# Patient Record
Sex: Female | Born: 1935 | Race: White | Hispanic: No | Smoking: Former smoker
Health system: Southern US, Community
[De-identification: ages and names within clinical notes are randomized; demographics above are authoritative.]

## PROBLEM LIST (undated history)

## (undated) DIAGNOSIS — C549 Malignant neoplasm of corpus uteri, unspecified: Secondary | ICD-10-CM

## (undated) DIAGNOSIS — H9190 Unspecified hearing loss, unspecified ear: Secondary | ICD-10-CM

## (undated) DIAGNOSIS — K648 Other hemorrhoids: Secondary | ICD-10-CM

## (undated) DIAGNOSIS — Z8371 Family history of colonic polyps: Secondary | ICD-10-CM

## (undated) DIAGNOSIS — C541 Malignant neoplasm of endometrium: Secondary | ICD-10-CM

## (undated) DIAGNOSIS — K449 Diaphragmatic hernia without obstruction or gangrene: Secondary | ICD-10-CM

## (undated) DIAGNOSIS — Z83719 Family history of colon polyps, unspecified: Secondary | ICD-10-CM

## (undated) DIAGNOSIS — I350 Nonrheumatic aortic (valve) stenosis: Secondary | ICD-10-CM

## (undated) DIAGNOSIS — C50919 Malignant neoplasm of unspecified site of unspecified female breast: Secondary | ICD-10-CM

## (undated) DIAGNOSIS — R7309 Other abnormal glucose: Secondary | ICD-10-CM

## (undated) DIAGNOSIS — F1010 Alcohol abuse, uncomplicated: Secondary | ICD-10-CM

## (undated) DIAGNOSIS — R42 Dizziness and giddiness: Secondary | ICD-10-CM

## (undated) DIAGNOSIS — E785 Hyperlipidemia, unspecified: Secondary | ICD-10-CM

## (undated) DIAGNOSIS — I10 Essential (primary) hypertension: Secondary | ICD-10-CM

## (undated) DIAGNOSIS — M1990 Unspecified osteoarthritis, unspecified site: Secondary | ICD-10-CM

## (undated) DIAGNOSIS — F17200 Nicotine dependence, unspecified, uncomplicated: Secondary | ICD-10-CM

## (undated) DIAGNOSIS — K5730 Diverticulosis of large intestine without perforation or abscess without bleeding: Secondary | ICD-10-CM

## (undated) DIAGNOSIS — M25519 Pain in unspecified shoulder: Secondary | ICD-10-CM

## (undated) DIAGNOSIS — K224 Dyskinesia of esophagus: Secondary | ICD-10-CM

## (undated) DIAGNOSIS — N950 Postmenopausal bleeding: Secondary | ICD-10-CM

## (undated) DIAGNOSIS — R011 Cardiac murmur, unspecified: Secondary | ICD-10-CM

## (undated) HISTORY — DX: Malignant neoplasm of corpus uteri, unspecified: C54.9

## (undated) HISTORY — DX: Unspecified hearing loss, unspecified ear: H91.90

## (undated) HISTORY — DX: Other hemorrhoids: K64.8

## (undated) HISTORY — DX: Family history of colon polyps, unspecified: Z83.719

## (undated) HISTORY — PX: OTHER SURGICAL HISTORY: SHX169

## (undated) HISTORY — PX: CATARACT EXTRACTION: SUR2

## (undated) HISTORY — DX: Dizziness and giddiness: R42

## (undated) HISTORY — DX: Nicotine dependence, unspecified, uncomplicated: F17.200

## (undated) HISTORY — DX: Dyskinesia of esophagus: K22.4

## (undated) HISTORY — DX: Alcohol abuse, uncomplicated: F10.10

## (undated) HISTORY — DX: Family history of colonic polyps: Z83.71

## (undated) HISTORY — DX: Pain in unspecified shoulder: M25.519

## (undated) HISTORY — DX: Hyperlipidemia, unspecified: E78.5

## (undated) HISTORY — DX: Essential (primary) hypertension: I10

## (undated) HISTORY — DX: Diverticulosis of large intestine without perforation or abscess without bleeding: K57.30

## (undated) HISTORY — DX: Other abnormal glucose: R73.09

## (undated) HISTORY — DX: Malignant neoplasm of endometrium: C54.1

## (undated) HISTORY — DX: Postmenopausal bleeding: N95.0

## (undated) HISTORY — DX: Cardiac murmur, unspecified: R01.1

## (undated) HISTORY — DX: Diaphragmatic hernia without obstruction or gangrene: K44.9

---

## 1995-12-27 DIAGNOSIS — K449 Diaphragmatic hernia without obstruction or gangrene: Secondary | ICD-10-CM | POA: Insufficient documentation

## 1999-03-10 ENCOUNTER — Encounter: Admission: RE | Admit: 1999-03-10 | Discharge: 1999-03-10 | Payer: Self-pay | Admitting: Family Medicine

## 1999-03-22 ENCOUNTER — Other Ambulatory Visit: Admission: RE | Admit: 1999-03-22 | Discharge: 1999-03-22 | Payer: Self-pay | Admitting: Family Medicine

## 2000-04-01 ENCOUNTER — Other Ambulatory Visit: Admission: RE | Admit: 2000-04-01 | Discharge: 2000-04-01 | Payer: Self-pay | Admitting: Family Medicine

## 2000-04-12 ENCOUNTER — Encounter: Payer: Self-pay | Admitting: Internal Medicine

## 2000-04-12 ENCOUNTER — Encounter: Admission: RE | Admit: 2000-04-12 | Discharge: 2000-04-12 | Payer: Self-pay | Admitting: Internal Medicine

## 2000-05-14 ENCOUNTER — Encounter: Payer: Self-pay | Admitting: Gastroenterology

## 2001-04-14 ENCOUNTER — Encounter: Admission: RE | Admit: 2001-04-14 | Discharge: 2001-04-14 | Payer: Self-pay | Admitting: Family Medicine

## 2001-04-14 ENCOUNTER — Encounter: Payer: Self-pay | Admitting: Family Medicine

## 2002-09-17 ENCOUNTER — Encounter: Payer: Self-pay | Admitting: Family Medicine

## 2002-09-17 ENCOUNTER — Ambulatory Visit (HOSPITAL_COMMUNITY): Admission: RE | Admit: 2002-09-17 | Discharge: 2002-09-17 | Payer: Self-pay | Admitting: Family Medicine

## 2003-09-20 ENCOUNTER — Ambulatory Visit (HOSPITAL_COMMUNITY): Admission: RE | Admit: 2003-09-20 | Discharge: 2003-09-20 | Payer: Self-pay | Admitting: Family Medicine

## 2003-09-22 ENCOUNTER — Other Ambulatory Visit: Admission: RE | Admit: 2003-09-22 | Discharge: 2003-09-22 | Payer: Self-pay | Admitting: Internal Medicine

## 2003-09-26 ENCOUNTER — Encounter (INDEPENDENT_AMBULATORY_CARE_PROVIDER_SITE_OTHER): Payer: Self-pay | Admitting: Internal Medicine

## 2004-09-20 ENCOUNTER — Ambulatory Visit (HOSPITAL_COMMUNITY): Admission: RE | Admit: 2004-09-20 | Discharge: 2004-09-20 | Payer: Self-pay | Admitting: Family Medicine

## 2004-10-26 DIAGNOSIS — I359 Nonrheumatic aortic valve disorder, unspecified: Secondary | ICD-10-CM | POA: Insufficient documentation

## 2004-11-06 ENCOUNTER — Ambulatory Visit: Payer: Self-pay | Admitting: Family Medicine

## 2004-11-20 ENCOUNTER — Ambulatory Visit: Payer: Self-pay | Admitting: Family Medicine

## 2004-11-21 ENCOUNTER — Ambulatory Visit: Payer: Self-pay

## 2004-12-04 ENCOUNTER — Ambulatory Visit: Payer: Self-pay | Admitting: Family Medicine

## 2004-12-05 ENCOUNTER — Ambulatory Visit: Payer: Self-pay | Admitting: Family Medicine

## 2004-12-05 ENCOUNTER — Ambulatory Visit: Payer: Self-pay | Admitting: *Deleted

## 2004-12-29 ENCOUNTER — Ambulatory Visit: Payer: Self-pay | Admitting: Family Medicine

## 2005-03-09 ENCOUNTER — Ambulatory Visit: Payer: Self-pay | Admitting: Family Medicine

## 2005-04-12 ENCOUNTER — Ambulatory Visit: Payer: Self-pay | Admitting: *Deleted

## 2005-05-10 ENCOUNTER — Ambulatory Visit: Payer: Self-pay | Admitting: *Deleted

## 2005-05-10 ENCOUNTER — Ambulatory Visit: Payer: Self-pay

## 2005-07-20 ENCOUNTER — Ambulatory Visit: Payer: Self-pay | Admitting: *Deleted

## 2005-08-21 ENCOUNTER — Ambulatory Visit: Payer: Self-pay | Admitting: *Deleted

## 2005-09-21 ENCOUNTER — Ambulatory Visit (HOSPITAL_COMMUNITY): Admission: RE | Admit: 2005-09-21 | Discharge: 2005-09-21 | Payer: Self-pay | Admitting: Family Medicine

## 2005-09-26 ENCOUNTER — Encounter: Admission: RE | Admit: 2005-09-26 | Discharge: 2005-09-26 | Payer: Self-pay | Admitting: Family Medicine

## 2005-11-22 ENCOUNTER — Encounter: Payer: Self-pay | Admitting: Cardiology

## 2005-11-22 ENCOUNTER — Ambulatory Visit: Payer: Self-pay

## 2005-11-25 DIAGNOSIS — R0989 Other specified symptoms and signs involving the circulatory and respiratory systems: Secondary | ICD-10-CM | POA: Insufficient documentation

## 2005-12-03 ENCOUNTER — Ambulatory Visit: Payer: Self-pay | Admitting: Family Medicine

## 2005-12-27 ENCOUNTER — Ambulatory Visit: Payer: Self-pay | Admitting: Family Medicine

## 2006-02-14 ENCOUNTER — Ambulatory Visit: Payer: Self-pay | Admitting: *Deleted

## 2006-02-18 ENCOUNTER — Ambulatory Visit: Payer: Self-pay | Admitting: *Deleted

## 2006-02-25 ENCOUNTER — Ambulatory Visit: Payer: Self-pay

## 2006-03-13 ENCOUNTER — Ambulatory Visit: Payer: Self-pay

## 2006-03-27 ENCOUNTER — Ambulatory Visit: Payer: Self-pay | Admitting: Internal Medicine

## 2006-04-30 ENCOUNTER — Ambulatory Visit: Payer: Self-pay | Admitting: *Deleted

## 2006-05-13 ENCOUNTER — Ambulatory Visit: Payer: Self-pay | Admitting: *Deleted

## 2006-05-13 LAB — CONVERTED CEMR LAB
CO2: 29 meq/L (ref 19–32)
Chloride: 109 meq/L (ref 96–112)
Chol/HDL Ratio, serum: 2.8
Cholesterol: 134 mg/dL (ref 0–200)
GFR calc non Af Amer: 75 mL/min
Glomerular Filtration Rate, Af Am: 91 mL/min/{1.73_m2}
LDL Cholesterol: 75 mg/dL (ref 0–99)
Potassium: 4.1 meq/L (ref 3.5–5.1)
Total Protein: 6.3 g/dL (ref 6.0–8.3)
Triglyceride fasting, serum: 58 mg/dL (ref 0–149)
VLDL: 12 mg/dL (ref 0–40)

## 2006-06-04 ENCOUNTER — Ambulatory Visit: Payer: Self-pay | Admitting: Family Medicine

## 2006-06-28 DIAGNOSIS — M899 Disorder of bone, unspecified: Secondary | ICD-10-CM

## 2006-06-28 DIAGNOSIS — M949 Disorder of cartilage, unspecified: Secondary | ICD-10-CM

## 2006-06-28 LAB — HM DEXA SCAN: HM Dexa Scan: POSITIVE

## 2006-07-18 ENCOUNTER — Ambulatory Visit: Payer: Self-pay | Admitting: Internal Medicine

## 2006-08-20 ENCOUNTER — Ambulatory Visit: Payer: Self-pay | Admitting: Family Medicine

## 2006-09-10 ENCOUNTER — Ambulatory Visit: Payer: Self-pay | Admitting: *Deleted

## 2006-09-10 LAB — CONVERTED CEMR LAB
ALT: 32 units/L (ref 0–40)
BUN: 16 mg/dL (ref 6–23)
Bilirubin, Direct: 0.1 mg/dL (ref 0.0–0.3)
Calcium: 10 mg/dL (ref 8.4–10.5)
HDL: 65.2 mg/dL (ref >39.0)
LDL Cholesterol: 69 mg/dL (ref 0–99)
Total CHOL/HDL Ratio: 2.3
Total Protein: 7.2 g/dL (ref 6.0–8.3)
Triglycerides: 80 mg/dL (ref 0–149)

## 2006-10-09 ENCOUNTER — Encounter: Admission: RE | Admit: 2006-10-09 | Discharge: 2006-10-09 | Payer: Self-pay | Admitting: Family Medicine

## 2006-10-11 ENCOUNTER — Encounter: Payer: Self-pay | Admitting: Family Medicine

## 2006-10-15 ENCOUNTER — Encounter (INDEPENDENT_AMBULATORY_CARE_PROVIDER_SITE_OTHER): Payer: Self-pay | Admitting: *Deleted

## 2006-10-28 ENCOUNTER — Ambulatory Visit: Payer: Self-pay

## 2006-10-28 ENCOUNTER — Encounter (INDEPENDENT_AMBULATORY_CARE_PROVIDER_SITE_OTHER): Payer: Self-pay | Admitting: *Deleted

## 2006-10-30 ENCOUNTER — Ambulatory Visit: Payer: Self-pay | Admitting: *Deleted

## 2007-02-24 ENCOUNTER — Ambulatory Visit: Payer: Self-pay | Admitting: Cardiovascular Disease

## 2007-03-07 ENCOUNTER — Ambulatory Visit: Payer: Self-pay | Admitting: Family Medicine

## 2007-08-12 ENCOUNTER — Ambulatory Visit: Payer: Self-pay | Admitting: Family Medicine

## 2007-09-23 ENCOUNTER — Ambulatory Visit: Payer: Self-pay | Admitting: Cardiovascular Disease

## 2007-09-23 LAB — CONVERTED CEMR LAB
AST: 26 units/L (ref 0–37)
Albumin: 3.9 g/dL (ref 3.5–5.2)
Cholesterol: 148 mg/dL (ref 0–200)
HDL: 53.9 mg/dL (ref >39.0)
LDL Cholesterol: 76 mg/dL (ref 0–99)
Total Bilirubin: 0.7 mg/dL (ref 0.3–1.2)
Total CHOL/HDL Ratio: 2.7
Total Protein: 6.8 g/dL (ref 6.0–8.3)
Triglycerides: 93 mg/dL (ref 0–149)
VLDL: 19 mg/dL (ref 0–40)

## 2007-10-07 ENCOUNTER — Ambulatory Visit: Payer: Self-pay | Admitting: Family Medicine

## 2007-10-07 DIAGNOSIS — I10 Essential (primary) hypertension: Secondary | ICD-10-CM | POA: Insufficient documentation

## 2007-10-07 DIAGNOSIS — E785 Hyperlipidemia, unspecified: Secondary | ICD-10-CM

## 2007-10-07 DIAGNOSIS — N950 Postmenopausal bleeding: Secondary | ICD-10-CM

## 2007-10-07 LAB — CONVERTED CEMR LAB
Epithelial cells, urine: 0 /lpf
Ketones, urine, test strip: NEGATIVE
RBC / HPF: 0
Specific Gravity, Urine: 1.015
pH: 6

## 2007-10-10 ENCOUNTER — Ambulatory Visit (HOSPITAL_COMMUNITY): Admission: RE | Admit: 2007-10-10 | Discharge: 2007-10-10 | Payer: Self-pay | Admitting: Family Medicine

## 2007-10-13 ENCOUNTER — Encounter (INDEPENDENT_AMBULATORY_CARE_PROVIDER_SITE_OTHER): Payer: Self-pay | Admitting: *Deleted

## 2007-10-15 ENCOUNTER — Encounter (INDEPENDENT_AMBULATORY_CARE_PROVIDER_SITE_OTHER): Payer: Self-pay | Admitting: Internal Medicine

## 2007-10-15 DIAGNOSIS — K224 Dyskinesia of esophagus: Secondary | ICD-10-CM

## 2007-10-15 DIAGNOSIS — K648 Other hemorrhoids: Secondary | ICD-10-CM | POA: Insufficient documentation

## 2007-10-15 DIAGNOSIS — K5730 Diverticulosis of large intestine without perforation or abscess without bleeding: Secondary | ICD-10-CM | POA: Insufficient documentation

## 2007-10-15 DIAGNOSIS — F1010 Alcohol abuse, uncomplicated: Secondary | ICD-10-CM | POA: Insufficient documentation

## 2007-10-15 DIAGNOSIS — K644 Residual hemorrhoidal skin tags: Secondary | ICD-10-CM | POA: Insufficient documentation

## 2007-10-15 DIAGNOSIS — F17200 Nicotine dependence, unspecified, uncomplicated: Secondary | ICD-10-CM | POA: Insufficient documentation

## 2007-10-15 DIAGNOSIS — H9190 Unspecified hearing loss, unspecified ear: Secondary | ICD-10-CM | POA: Insufficient documentation

## 2007-10-16 ENCOUNTER — Ambulatory Visit: Payer: Self-pay | Admitting: Obstetrics & Gynecology

## 2007-10-17 ENCOUNTER — Encounter (INDEPENDENT_AMBULATORY_CARE_PROVIDER_SITE_OTHER): Payer: Self-pay | Admitting: Internal Medicine

## 2007-10-17 ENCOUNTER — Encounter: Payer: Self-pay | Admitting: Family Medicine

## 2007-10-21 ENCOUNTER — Ambulatory Visit (HOSPITAL_COMMUNITY): Admission: RE | Admit: 2007-10-21 | Discharge: 2007-10-21 | Payer: Self-pay | Admitting: Obstetrics & Gynecology

## 2007-10-28 ENCOUNTER — Ambulatory Visit: Admission: RE | Admit: 2007-10-28 | Discharge: 2007-10-28 | Payer: Self-pay | Admitting: Gynecologic Oncology

## 2007-10-28 DIAGNOSIS — Z8542 Personal history of malignant neoplasm of other parts of uterus: Secondary | ICD-10-CM | POA: Insufficient documentation

## 2007-10-30 ENCOUNTER — Encounter (INDEPENDENT_AMBULATORY_CARE_PROVIDER_SITE_OTHER): Payer: Self-pay | Admitting: Internal Medicine

## 2007-11-03 ENCOUNTER — Ambulatory Visit: Payer: Self-pay | Admitting: Cardiovascular Disease

## 2007-11-04 ENCOUNTER — Inpatient Hospital Stay (HOSPITAL_COMMUNITY): Admission: RE | Admit: 2007-11-04 | Discharge: 2007-11-07 | Payer: Self-pay | Admitting: Obstetrics & Gynecology

## 2007-11-04 ENCOUNTER — Encounter (INDEPENDENT_AMBULATORY_CARE_PROVIDER_SITE_OTHER): Payer: Self-pay | Admitting: Internal Medicine

## 2007-11-04 ENCOUNTER — Encounter: Payer: Self-pay | Admitting: Gynecology

## 2007-11-04 HISTORY — PX: TOTAL ABDOMINAL HYSTERECTOMY W/ BILATERAL SALPINGOOPHORECTOMY: SHX83

## 2007-11-04 HISTORY — PX: LAPAROSCOPY W/ TOTAL BILATERAL PELVIC AND PERI-AORTIC LYMPHADENECTOMY: SUR807

## 2007-11-10 ENCOUNTER — Encounter (INDEPENDENT_AMBULATORY_CARE_PROVIDER_SITE_OTHER): Payer: Self-pay | Admitting: Internal Medicine

## 2007-11-12 ENCOUNTER — Encounter (INDEPENDENT_AMBULATORY_CARE_PROVIDER_SITE_OTHER): Payer: Self-pay | Admitting: Internal Medicine

## 2007-12-05 ENCOUNTER — Encounter: Payer: Self-pay | Admitting: Family Medicine

## 2007-12-05 ENCOUNTER — Ambulatory Visit: Admission: RE | Admit: 2007-12-05 | Discharge: 2007-12-05 | Payer: Self-pay | Admitting: Gynecology

## 2007-12-08 ENCOUNTER — Ambulatory Visit: Admission: RE | Admit: 2007-12-08 | Discharge: 2008-01-21 | Payer: Self-pay | Admitting: Radiation Oncology

## 2007-12-09 ENCOUNTER — Encounter: Payer: Self-pay | Admitting: Family Medicine

## 2007-12-30 ENCOUNTER — Ambulatory Visit: Payer: Self-pay

## 2007-12-30 ENCOUNTER — Encounter: Payer: Self-pay | Admitting: Cardiovascular Disease

## 2008-02-17 ENCOUNTER — Ambulatory Visit: Payer: Self-pay | Admitting: Family Medicine

## 2008-03-26 ENCOUNTER — Ambulatory Visit: Admission: RE | Admit: 2008-03-26 | Discharge: 2008-03-26 | Payer: Self-pay | Admitting: Gynecology

## 2008-03-26 ENCOUNTER — Other Ambulatory Visit: Admission: RE | Admit: 2008-03-26 | Discharge: 2008-03-26 | Payer: Self-pay | Admitting: Gynecology

## 2008-03-26 ENCOUNTER — Encounter: Payer: Self-pay | Admitting: Gynecology

## 2008-03-28 ENCOUNTER — Encounter (INDEPENDENT_AMBULATORY_CARE_PROVIDER_SITE_OTHER): Payer: Self-pay | Admitting: Internal Medicine

## 2008-04-02 ENCOUNTER — Encounter (INDEPENDENT_AMBULATORY_CARE_PROVIDER_SITE_OTHER): Payer: Self-pay | Admitting: Internal Medicine

## 2008-05-10 ENCOUNTER — Ambulatory Visit: Payer: Self-pay | Admitting: Cardiovascular Disease

## 2008-05-28 HISTORY — PX: ROTATOR CUFF REPAIR: SHX139

## 2008-06-01 ENCOUNTER — Ambulatory Visit: Payer: Self-pay | Admitting: Family Medicine

## 2008-06-02 LAB — CONVERTED CEMR LAB
ALT: 35 units/L (ref 0–35)
AST: 24 units/L (ref 0–37)
BUN: 23 mg/dL (ref 6–23)
Calcium: 10.1 mg/dL (ref 8.4–10.5)
Creatinine, Ser: 0.6 mg/dL (ref 0.4–1.2)
GFR calc Af Amer: 126 mL/min
GFR calc non Af Amer: 104 mL/min
Potassium: 3.5 meq/L (ref 3.5–5.1)
Sodium: 141 meq/L (ref 135–145)

## 2008-06-14 ENCOUNTER — Ambulatory Visit (HOSPITAL_BASED_OUTPATIENT_CLINIC_OR_DEPARTMENT_OTHER): Admission: RE | Admit: 2008-06-14 | Discharge: 2008-06-15 | Payer: Self-pay | Admitting: Specialist

## 2008-07-09 ENCOUNTER — Ambulatory Visit: Payer: Self-pay | Admitting: Internal Medicine

## 2008-07-09 DIAGNOSIS — R7309 Other abnormal glucose: Secondary | ICD-10-CM

## 2008-07-09 DIAGNOSIS — R7303 Prediabetes: Secondary | ICD-10-CM | POA: Insufficient documentation

## 2008-07-14 ENCOUNTER — Encounter: Payer: Self-pay | Admitting: Gynecology

## 2008-07-14 ENCOUNTER — Other Ambulatory Visit: Admission: RE | Admit: 2008-07-14 | Discharge: 2008-07-14 | Payer: Self-pay | Admitting: Gynecology

## 2008-07-14 ENCOUNTER — Encounter (INDEPENDENT_AMBULATORY_CARE_PROVIDER_SITE_OTHER): Payer: Self-pay | Admitting: Internal Medicine

## 2008-07-14 ENCOUNTER — Ambulatory Visit: Admission: RE | Admit: 2008-07-14 | Discharge: 2008-07-14 | Payer: Self-pay | Admitting: Gynecology

## 2008-07-14 LAB — CONVERTED CEMR LAB
Glucose, Bld: 114 mg/dL — ABNORMAL HIGH (ref 70–99)
HDL: 55.2 mg/dL (ref >39.0)
LDL Cholesterol: 76 mg/dL (ref 0–99)
Total CHOL/HDL Ratio: 2.7
VLDL: 19 mg/dL (ref 0–40)

## 2008-07-30 ENCOUNTER — Ambulatory Visit: Payer: Self-pay | Admitting: Gastroenterology

## 2008-07-30 DIAGNOSIS — K921 Melena: Secondary | ICD-10-CM

## 2008-07-30 DIAGNOSIS — K5900 Constipation, unspecified: Secondary | ICD-10-CM | POA: Insufficient documentation

## 2008-08-06 ENCOUNTER — Ambulatory Visit: Payer: Self-pay | Admitting: Gastroenterology

## 2008-08-06 ENCOUNTER — Encounter: Payer: Self-pay | Admitting: Gastroenterology

## 2008-08-06 LAB — HM COLONOSCOPY

## 2008-08-09 ENCOUNTER — Encounter: Payer: Self-pay | Admitting: Gastroenterology

## 2008-09-07 ENCOUNTER — Ambulatory Visit: Payer: Self-pay | Admitting: Family Medicine

## 2008-09-07 DIAGNOSIS — R609 Edema, unspecified: Secondary | ICD-10-CM

## 2008-09-28 ENCOUNTER — Other Ambulatory Visit: Admission: RE | Admit: 2008-09-28 | Discharge: 2008-09-28 | Payer: Self-pay | Admitting: Obstetrics and Gynecology

## 2008-09-28 ENCOUNTER — Encounter: Payer: Self-pay | Admitting: Obstetrics and Gynecology

## 2008-09-28 ENCOUNTER — Ambulatory Visit: Payer: Self-pay | Admitting: Family Medicine

## 2008-10-11 ENCOUNTER — Encounter (INDEPENDENT_AMBULATORY_CARE_PROVIDER_SITE_OTHER): Payer: Self-pay | Admitting: Internal Medicine

## 2008-10-11 ENCOUNTER — Ambulatory Visit: Payer: Self-pay | Admitting: Internal Medicine

## 2008-10-12 ENCOUNTER — Ambulatory Visit (HOSPITAL_COMMUNITY): Admission: RE | Admit: 2008-10-12 | Discharge: 2008-10-12 | Payer: Self-pay | Admitting: Family Medicine

## 2008-10-14 ENCOUNTER — Encounter: Admission: RE | Admit: 2008-10-14 | Discharge: 2008-10-14 | Payer: Self-pay | Admitting: Family Medicine

## 2008-10-21 ENCOUNTER — Encounter (INDEPENDENT_AMBULATORY_CARE_PROVIDER_SITE_OTHER): Payer: Self-pay | Admitting: Internal Medicine

## 2008-11-19 ENCOUNTER — Encounter: Payer: Self-pay | Admitting: Family Medicine

## 2008-11-19 ENCOUNTER — Ambulatory Visit: Admission: RE | Admit: 2008-11-19 | Discharge: 2008-11-19 | Payer: Self-pay | Admitting: Gynecology

## 2008-11-19 ENCOUNTER — Encounter: Payer: Self-pay | Admitting: Gynecology

## 2008-11-19 ENCOUNTER — Other Ambulatory Visit: Admission: RE | Admit: 2008-11-19 | Discharge: 2008-11-19 | Payer: Self-pay | Admitting: Gynecology

## 2009-01-13 ENCOUNTER — Telehealth: Payer: Self-pay | Admitting: Nurse Practitioner

## 2009-01-13 ENCOUNTER — Ambulatory Visit: Payer: Self-pay | Admitting: Cardiovascular Disease

## 2009-01-14 ENCOUNTER — Ambulatory Visit: Payer: Self-pay | Admitting: Cardiovascular Disease

## 2009-01-17 LAB — CONVERTED CEMR LAB
BUN: 17 mg/dL (ref 6–23)
Chloride: 102 meq/L (ref 96–112)
GFR calc non Af Amer: 74.68 mL/min (ref >60)
Magnesium: 2.4 mg/dL (ref 1.5–2.5)
Potassium: 3.1 meq/L — ABNORMAL LOW (ref 3.5–5.1)
Sodium: 144 meq/L (ref 135–145)

## 2009-01-21 ENCOUNTER — Ambulatory Visit: Payer: Self-pay | Admitting: Cardiovascular Disease

## 2009-01-24 ENCOUNTER — Encounter: Payer: Self-pay | Admitting: Cardiovascular Disease

## 2009-01-24 LAB — CONVERTED CEMR LAB
Calcium: 9.2 mg/dL (ref 8.4–10.5)
Chloride: 111 meq/L (ref 96–112)
Creatinine, Ser: 0.8 mg/dL (ref 0.4–1.2)
Sodium: 146 meq/L — ABNORMAL HIGH (ref 135–145)

## 2009-03-01 ENCOUNTER — Encounter (INDEPENDENT_AMBULATORY_CARE_PROVIDER_SITE_OTHER): Payer: Self-pay | Admitting: Internal Medicine

## 2009-03-01 ENCOUNTER — Ambulatory Visit: Payer: Self-pay | Admitting: Family Medicine

## 2009-03-03 LAB — CONVERTED CEMR LAB
BUN: 15 mg/dL (ref 6–23)
CO2: 33 meq/L — ABNORMAL HIGH (ref 19–32)
Cholesterol: 142 mg/dL (ref 0–200)
Glucose, Bld: 110 mg/dL — ABNORMAL HIGH (ref 70–99)
HDL: 44.2 mg/dL (ref >39.00)
LDL Cholesterol: 83 mg/dL (ref 0–99)
Potassium: 3.7 meq/L (ref 3.5–5.1)
Sodium: 144 meq/L (ref 135–145)
Triglycerides: 74 mg/dL (ref 0.0–149.0)

## 2009-03-08 ENCOUNTER — Ambulatory Visit: Payer: Self-pay | Admitting: Family Medicine

## 2009-03-08 ENCOUNTER — Other Ambulatory Visit: Admission: RE | Admit: 2009-03-08 | Discharge: 2009-03-08 | Payer: Self-pay | Admitting: Family Medicine

## 2009-03-08 ENCOUNTER — Encounter: Payer: Self-pay | Admitting: Family Medicine

## 2009-05-06 ENCOUNTER — Emergency Department (HOSPITAL_COMMUNITY): Admission: EM | Admit: 2009-05-06 | Discharge: 2009-05-07 | Payer: Self-pay | Admitting: Emergency Medicine

## 2009-06-29 ENCOUNTER — Ambulatory Visit: Admission: RE | Admit: 2009-06-29 | Discharge: 2009-06-29 | Payer: Self-pay | Admitting: Gynecologic Oncology

## 2009-06-29 ENCOUNTER — Encounter: Payer: Self-pay | Admitting: Cardiovascular Disease

## 2009-06-29 ENCOUNTER — Other Ambulatory Visit: Admission: RE | Admit: 2009-06-29 | Discharge: 2009-06-29 | Payer: Self-pay | Admitting: Gynecologic Oncology

## 2009-07-11 ENCOUNTER — Ambulatory Visit: Payer: Self-pay | Admitting: Cardiovascular Disease

## 2009-07-28 ENCOUNTER — Ambulatory Visit: Payer: Self-pay

## 2009-07-28 ENCOUNTER — Ambulatory Visit: Payer: Self-pay | Admitting: Internal Medicine

## 2009-07-28 ENCOUNTER — Ambulatory Visit (HOSPITAL_COMMUNITY): Admission: RE | Admit: 2009-07-28 | Discharge: 2009-07-28 | Payer: Self-pay | Admitting: Cardiovascular Disease

## 2009-07-28 ENCOUNTER — Encounter: Payer: Self-pay | Admitting: Cardiovascular Disease

## 2009-08-21 IMAGING — US US TRANSVAGINAL NON-OB
1 series · 14 of 25 positions shown · non-contrast
Comparison: None

CLINICAL DATA: Postmenopausal bleeding

TRANSVAGINAL ULTRASOUND OF PELVIS,ULTRASOUND PELVIS COMPLETE -
MODIFY
TECHNIQUE: Transvaginal ultrasound examination of the pelvis was
performed including evaluation of the uterus, ovaries, adnexal
regions, and pelvic cul-de-sac,

[Series 1: us transvaginal non-ob · 0.28mm/px · 14 of 43 slices shown]
[im 1/43]
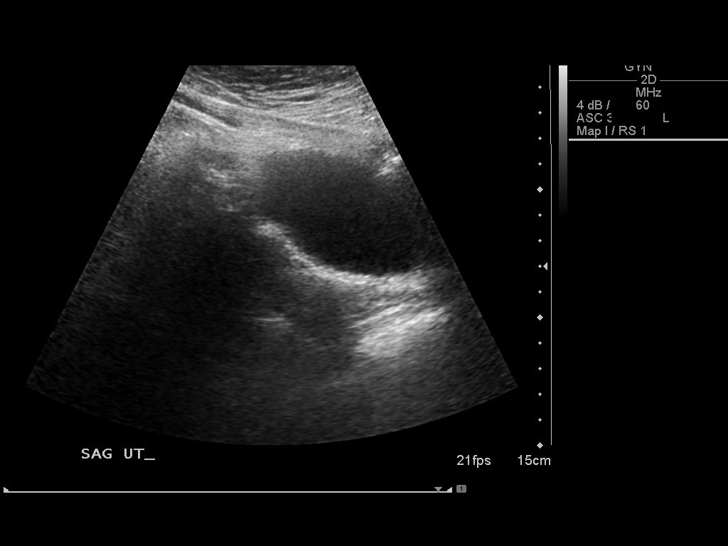
[im 4/43]
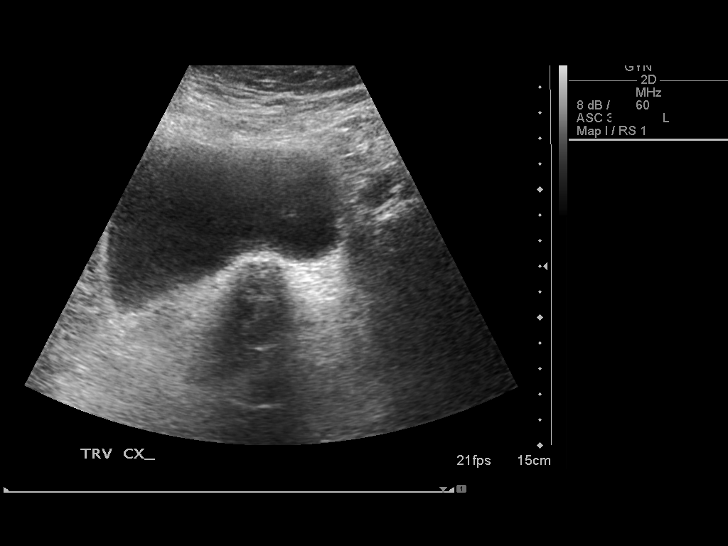
[im 8/43]
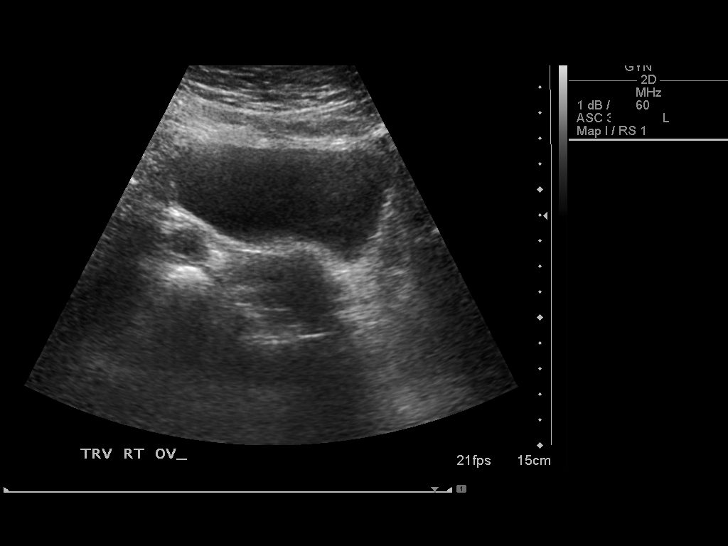
[im 11/43]
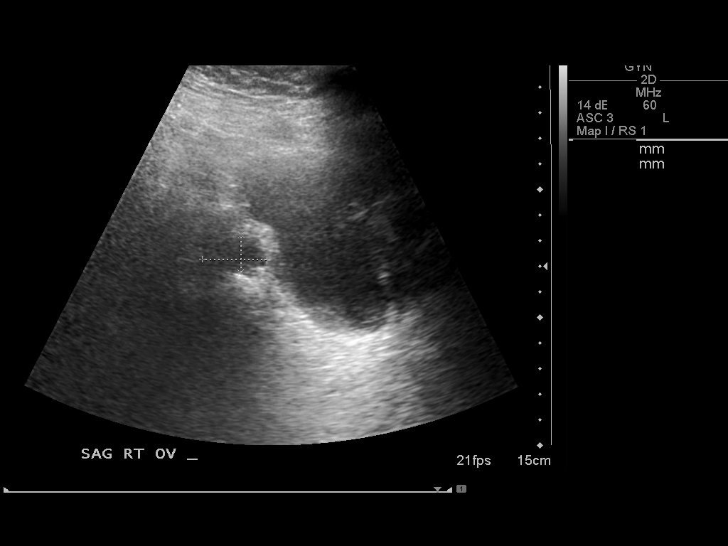
[im 15/43]
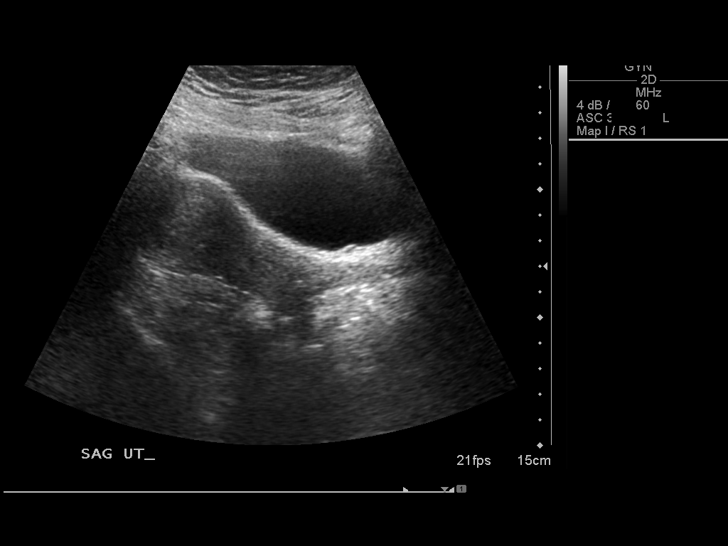
[im 16/43]
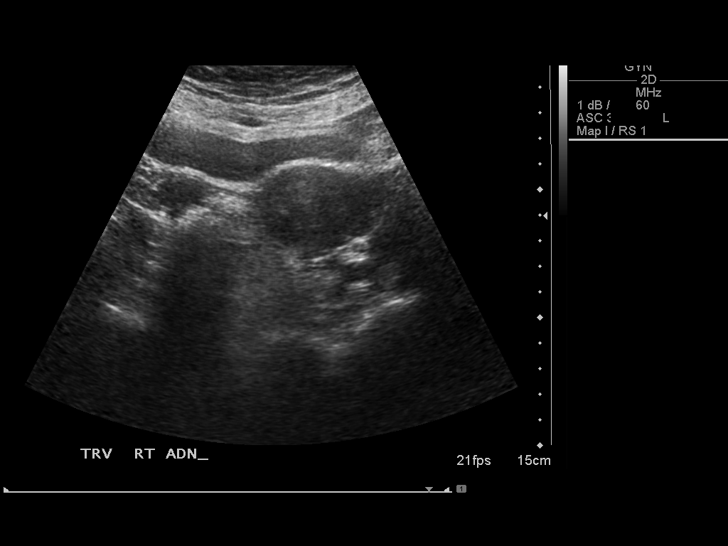
[im 20/43]
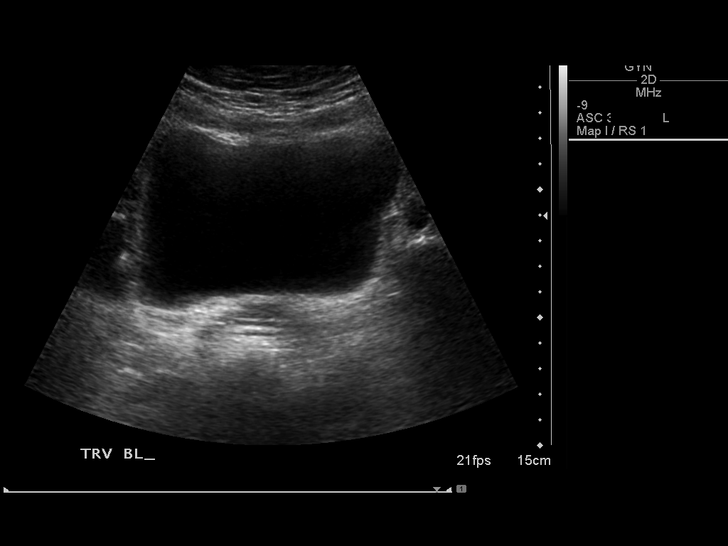
[im 23/43]
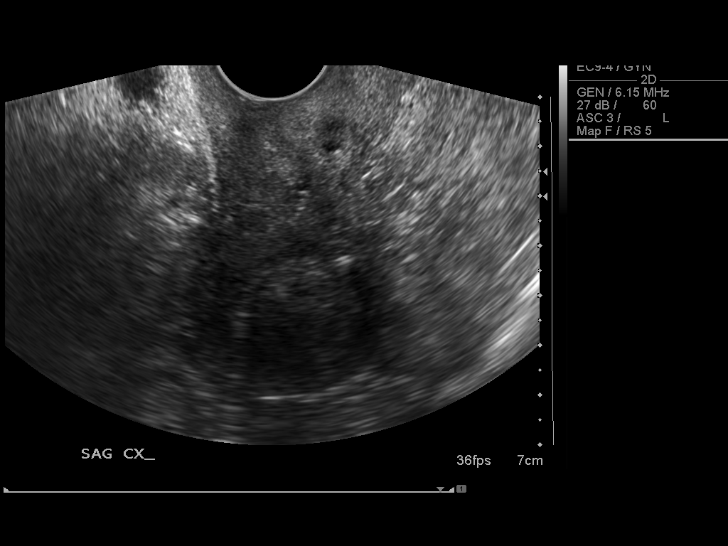
[im 27/43]
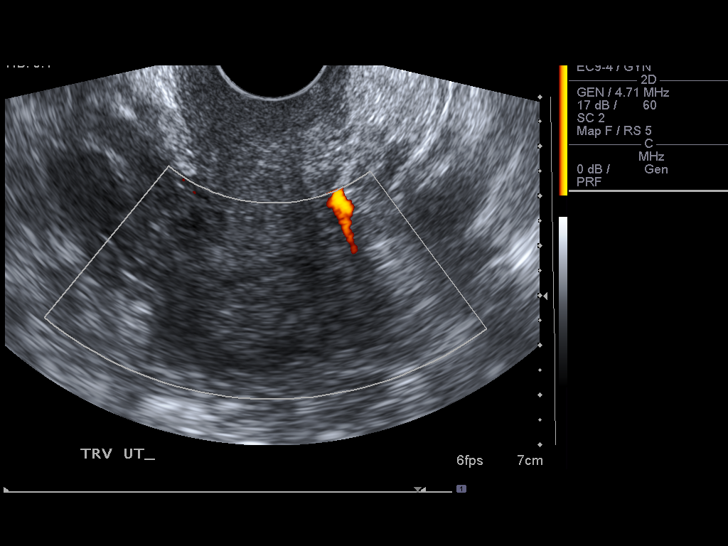
[im 29/43]
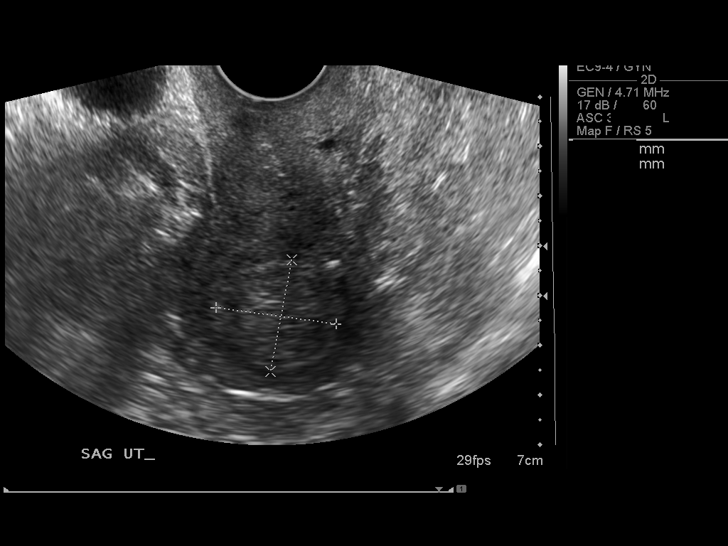
[im 32/43]
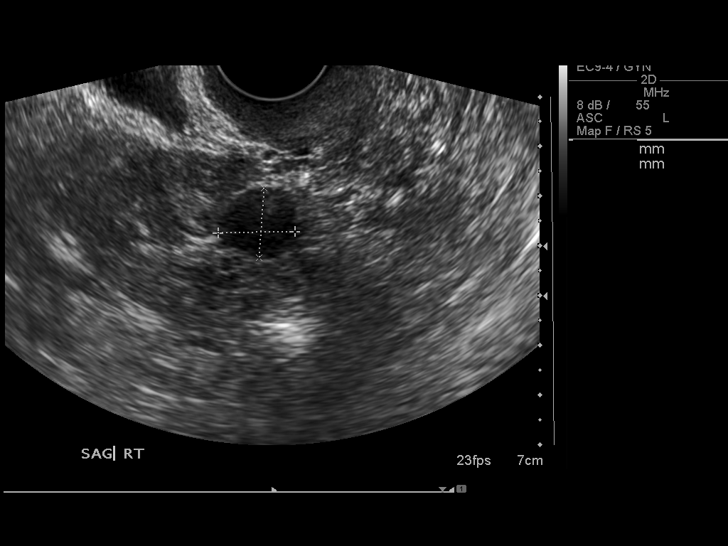
[im 36/43]
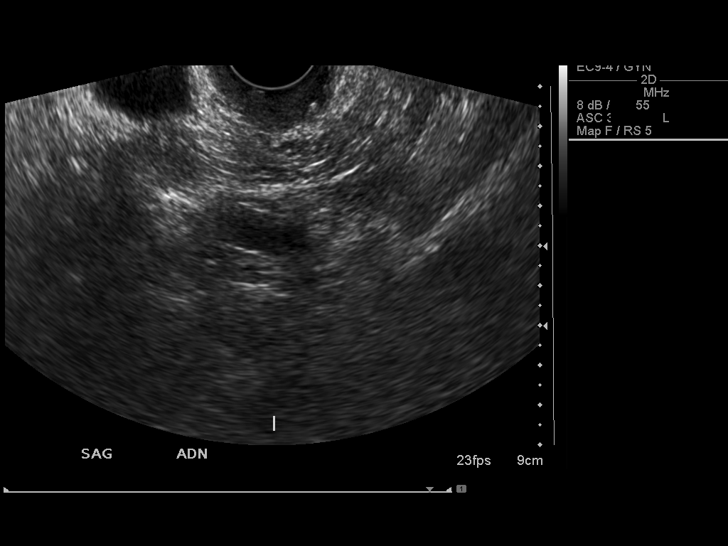
[im 39/43]
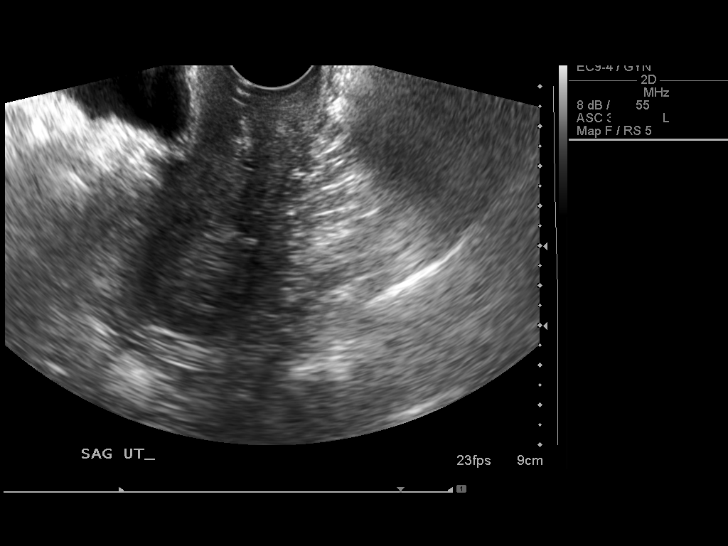
[im 43/43]
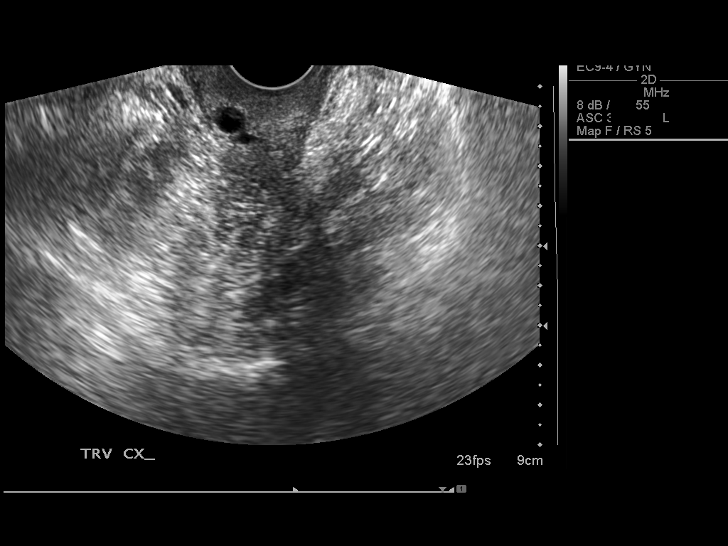

[14 of 25 positions shown; findings below may reference images not displayed]

FINDINGS: The uterus is normal in size measuring 5.9 x 3.3 x
cm.  The endometrium at the fundus appears thickened, measuring 24
mm in width.  However, this area has a rounded configuration and
could represent a submucosal fibroid.

The right ovary has a normal appearance and measures 2.0 x 1.4 to
0.6 cm.  The left ovary is not seen.  No adnexal masses or free
pelvic fluid are identified.
IMPRESSION: There is  thickening of the endometrium at the fundus.  It is
uncertain whether this represents abnormal endometrial tissue or is
a submucosal fibroid. Please correlate with the endometrial biopsy
results which was performed last week.  MRI may be helpful for
better characterization, if necessary.

## 2009-08-29 ENCOUNTER — Ambulatory Visit: Payer: Self-pay | Admitting: Family Medicine

## 2009-08-29 DIAGNOSIS — E559 Vitamin D deficiency, unspecified: Secondary | ICD-10-CM

## 2009-08-29 LAB — CONVERTED CEMR LAB
HDL: 49.2 mg/dL (ref >39.00)
LDL Cholesterol: 74 mg/dL (ref 0–99)
Total CHOL/HDL Ratio: 3
Triglycerides: 98 mg/dL (ref 0.0–149.0)

## 2009-09-21 ENCOUNTER — Ambulatory Visit: Payer: Self-pay | Admitting: Family Medicine

## 2009-09-25 HISTORY — PX: BREAST BIOPSY: SHX20

## 2009-10-17 ENCOUNTER — Ambulatory Visit (HOSPITAL_COMMUNITY): Admission: RE | Admit: 2009-10-17 | Discharge: 2009-10-17 | Payer: Self-pay | Admitting: Family Medicine

## 2009-10-18 DIAGNOSIS — R928 Other abnormal and inconclusive findings on diagnostic imaging of breast: Secondary | ICD-10-CM | POA: Insufficient documentation

## 2009-10-21 ENCOUNTER — Encounter: Payer: Self-pay | Admitting: Family Medicine

## 2009-10-21 ENCOUNTER — Encounter: Admission: RE | Admit: 2009-10-21 | Discharge: 2009-10-21 | Payer: Self-pay | Admitting: Family Medicine

## 2009-10-21 LAB — HM MAMMOGRAPHY

## 2009-10-26 ENCOUNTER — Ambulatory Visit: Payer: Self-pay | Admitting: Family Medicine

## 2009-10-26 ENCOUNTER — Other Ambulatory Visit: Admission: RE | Admit: 2009-10-26 | Discharge: 2009-10-26 | Payer: Self-pay | Admitting: Family Medicine

## 2009-10-27 ENCOUNTER — Encounter: Payer: Self-pay | Admitting: Cardiovascular Disease

## 2009-10-27 ENCOUNTER — Encounter: Payer: Self-pay | Admitting: Family Medicine

## 2009-10-28 ENCOUNTER — Encounter: Admission: RE | Admit: 2009-10-28 | Discharge: 2009-10-28 | Payer: Self-pay | Admitting: Family Medicine

## 2009-11-02 ENCOUNTER — Telehealth: Payer: Self-pay | Admitting: Family Medicine

## 2009-11-04 ENCOUNTER — Encounter: Payer: Self-pay | Admitting: Family Medicine

## 2009-11-04 ENCOUNTER — Encounter: Payer: Self-pay | Admitting: Cardiovascular Disease

## 2009-11-07 ENCOUNTER — Ambulatory Visit: Payer: Self-pay | Admitting: Cardiovascular Disease

## 2009-11-07 ENCOUNTER — Encounter: Payer: Self-pay | Admitting: Family Medicine

## 2009-11-11 ENCOUNTER — Ambulatory Visit
Admission: RE | Admit: 2009-11-11 | Discharge: 2009-12-16 | Payer: Self-pay | Source: Home / Self Care | Admitting: Radiation Oncology

## 2009-11-14 ENCOUNTER — Encounter: Payer: Self-pay | Admitting: Family Medicine

## 2009-11-24 ENCOUNTER — Encounter: Payer: Self-pay | Admitting: Family Medicine

## 2009-11-25 HISTORY — PX: BREAST LUMPECTOMY: SHX2

## 2009-11-29 ENCOUNTER — Encounter: Admission: RE | Admit: 2009-11-29 | Discharge: 2009-11-29 | Payer: Self-pay | Admitting: General Surgery

## 2009-12-02 ENCOUNTER — Encounter: Payer: Self-pay | Admitting: Family Medicine

## 2009-12-02 ENCOUNTER — Encounter: Admission: RE | Admit: 2009-12-02 | Discharge: 2009-12-02 | Payer: Self-pay | Admitting: General Surgery

## 2009-12-02 ENCOUNTER — Ambulatory Visit (HOSPITAL_BASED_OUTPATIENT_CLINIC_OR_DEPARTMENT_OTHER): Admission: RE | Admit: 2009-12-02 | Discharge: 2009-12-02 | Payer: Self-pay | Admitting: General Surgery

## 2009-12-07 ENCOUNTER — Telehealth: Payer: Self-pay | Admitting: Family Medicine

## 2009-12-07 ENCOUNTER — Encounter: Payer: Self-pay | Admitting: Family Medicine

## 2009-12-07 ENCOUNTER — Telehealth: Payer: Self-pay | Admitting: Cardiovascular Disease

## 2009-12-15 ENCOUNTER — Encounter: Payer: Self-pay | Admitting: Family Medicine

## 2009-12-15 ENCOUNTER — Ambulatory Visit: Payer: Self-pay | Admitting: Oncology

## 2009-12-19 ENCOUNTER — Encounter: Payer: Self-pay | Admitting: Family Medicine

## 2010-01-12 ENCOUNTER — Encounter: Payer: Self-pay | Admitting: Family Medicine

## 2010-02-06 ENCOUNTER — Telehealth: Payer: Self-pay | Admitting: Family Medicine

## 2010-03-03 ENCOUNTER — Other Ambulatory Visit: Admission: RE | Admit: 2010-03-03 | Discharge: 2010-03-03 | Payer: Self-pay | Admitting: Gynecology

## 2010-03-03 ENCOUNTER — Ambulatory Visit: Admission: RE | Admit: 2010-03-03 | Discharge: 2010-03-03 | Payer: Self-pay | Admitting: Gynecology

## 2010-03-03 ENCOUNTER — Encounter: Payer: Self-pay | Admitting: Family Medicine

## 2010-03-16 ENCOUNTER — Ambulatory Visit: Payer: Self-pay | Admitting: Family Medicine

## 2010-03-16 LAB — CONVERTED CEMR LAB
ALT: 30 units/L (ref 0–35)
Albumin: 4.3 g/dL (ref 3.5–5.2)
BUN: 19 mg/dL (ref 6–23)
Basophils Relative: 0.6 % (ref 0.0–3.0)
Creatinine, Ser: 0.8 mg/dL (ref 0.4–1.2)
Eosinophils Absolute: 0 10*3/uL (ref 0.0–0.7)
Glucose, Bld: 109 mg/dL — ABNORMAL HIGH (ref 70–99)
HDL: 50.9 mg/dL (ref >39.00)
MCHC: 35.3 g/dL (ref 30.0–36.0)
MCV: 89.3 fL (ref 78.0–100.0)
Monocytes Absolute: 0.5 10*3/uL (ref 0.1–1.0)
Neutrophils Relative %: 70 % (ref 43.0–77.0)
Phosphorus: 3.8 mg/dL (ref 2.3–4.6)
Platelets: 150 10*3/uL (ref 150.0–400.0)
RBC: 4.62 M/uL (ref 3.87–5.11)
Total CHOL/HDL Ratio: 4
Triglycerides: 119 mg/dL (ref 0.0–149.0)
VLDL: 23.8 mg/dL (ref 0.0–40.0)

## 2010-03-20 ENCOUNTER — Ambulatory Visit: Payer: Self-pay | Admitting: Family Medicine

## 2010-04-28 ENCOUNTER — Ambulatory Visit: Payer: Self-pay | Admitting: Cardiovascular Disease

## 2010-05-16 ENCOUNTER — Encounter: Payer: Self-pay | Admitting: Family Medicine

## 2010-05-23 ENCOUNTER — Ambulatory Visit
Admission: RE | Admit: 2010-05-23 | Discharge: 2010-05-23 | Payer: Self-pay | Source: Home / Self Care | Attending: Family Medicine | Admitting: Family Medicine

## 2010-05-23 DIAGNOSIS — B029 Zoster without complications: Secondary | ICD-10-CM | POA: Insufficient documentation

## 2010-06-25 LAB — CONVERTED CEMR LAB
CO2: 36 meq/L — ABNORMAL HIGH (ref 19–32)
Calcium: 9.5 mg/dL (ref 8.4–10.5)
Creatinine, Ser: 0.8 mg/dL (ref 0.4–1.2)
GFR calc non Af Amer: 74.68 mL/min (ref >60)
Sodium: 139 meq/L (ref 135–145)

## 2010-06-27 NOTE — Assessment & Plan Note (Signed)
Summary: f60m   Visit Type:  6 months follow up Primary Provider:  Everrett Coombe NP  CC:  No complaints.  History of Present Illness: This is a 77 year-old woman with moderate AS and HTN presenting today for follow-up. She continues to complain of leg swelling and chronic dyspnea with exertion. She has no other complaints at this time. Denies chest pain, lightheadedness, or syncope. Concerned that carvedilol is contributing to her weight gain.   Current Medications (verified): 1)  Simvastatin 20 Mg  Tabs (Simvastatin) .... One Tab By Mouth Daily 2)  Diltiazem Hcl Er Beads 360 Mg Xr24h-Cap (Diltiazem Hcl Er Beads) .... Take One Tablet By Mouth Daily 3)  Carvedilol 25 Mg  Tabs (Carvedilol) .... Take 1 Tablet By Mouth Two Times A Day 4)  Aspirin 81 Mg  Tabs (Aspirin) .Marland Kitchen.. 1 By Mouth Once Daily 5)  Calcium Carbonate-Vitamin D 600-400 Mg-Unit  Tabs (Calcium Carbonate-Vitamin D) .Marland Kitchen.. 1 By Mouth Once Daily 6)  Metamucil 30.9 % Powd (Psyllium) .Marland Kitchen.. 1  Dose Daily 7)  Furosemide 40 Mg Tabs (Furosemide) .... Take One Tablet By Mouth Daily. 8)  Potassium Chloride Crys Cr 20 Meq Cr-Tabs (Potassium Chloride Crys Cr) .... Take One Tablet By Mouth Twice A Day 9)  Vitamin D 1000 Unit  Tabs (Cholecalciferol) .... One Twice A Day  Allergies: 1)  ! Zetia (Ezetimibe) 2)  ! Pravachol (Pravastatin Sodium) 3)  ! * Altace 4)  ! Diovan  Past History:  Past medical history reviewed for relevance to current acute and chronic problems.  Past Medical History: Reviewed history from 08/24/2008 and no changes required. Current Problems:  CONSTIPATION (ICD-564.00) BLOOD IN STOOL (ICD-578.1) FAMILY HISTORY COLONIC POLYPS (ICD-V18.51) HYPERGLYCEMIA (ICD-790.29) HAND PAIN, LEFT (ICD-729.5) EDEMA- LOCALIZED (ICD-782.3) CARCINOMA, ENDOMETRIUM (ICD-182.0) SHOULDER PAIN, RIGHT (ICD-719.41) ADENOCARCINOMA, ENDOMETRIUM (ICD-182.0) HEARING LOSS, LEFT EAR (ICD-389.9) CAROTID BRUITS, BILATERAL (ICD-785.9) AORTIC  STENOSIS (ICD-424.1) ALCOHOL ABUSE (ICD-305.00) ESOPHAGEAL SPASM (ICD-530.5) HIATAL HERNIA (ICD-553.3) NICOTINE ADDICTION (ICD-305.1) HEMORRHOIDS, INTERNAL (ICD-455.0) EXTERNAL HEMORRHOIDS (ICD-455.3) OSTEOPENIA (ICD-733.90) DIVERTICULOSIS, COLON (ICD-562.10) POSTMENOPAUSAL BLEEDING (ICD-627.1) HYPERTENSION (ICD-401.9) HYPERLIPIDEMIA (ICD-272.4)  Review of Systems       Negative except as per HPI   Vital Signs:  Patient profile:   77 year old female Height:      66 inches Weight:      193 pounds BMI:     31.26 Pulse rate:   79 / minute Pulse rhythm:   regular Resp:     18 per minute BP sitting:   120 / 80  (left arm) Cuff size:   large  Vitals Entered By: Vikki Ports (July 11, 2009 10:13 AM)  Physical Exam  General:  Pt is alert and oriented, in no acute distress. HEENT: normal Neck: normal carotid upstrokes without bruits, JVP normal Lungs: CTA CV: RRR with a 2/6 harsh systolic murmur at the RUSB Abd: soft, NT, positive BS, no bruit, no organomegaly Ext: 1+ pretibial edema - nonpitting. peripheral pulses 2+ and equal Skin: warm and dry without rash    EKG  Procedure date:  07/11/2009  Findings:      NSR, LVH with repolarizatoin abnormality, HR 79 bpm  Impression & Recommendations:  Problem # 1:  AORTIC STENOSIS (ICD-424.1) Moderate AS by exam and last echo in 2009. Will repeat echo to rule out progressive AS a cause of her progressive edema.  Her updated medication list for this problem includes:    Carvedilol 25 Mg Tabs (Carvedilol) .Marland Kitchen... Take 1 tablet by mouth two times a day  Furosemide 40 Mg Tabs (Furosemide) .Marland Kitchen... Take one tablet by mouth daily.  Orders: EKG w/ Interpretation (93000) Echocardiogram (Echo)  Problem # 2:  HYPERTENSION (ICD-401.9) BP is well-controlled. I reviewed her hx of antihypertensives. She was intolerant to both ACE-inhibitors and ARB's. She has been on diltiazem for over 10 years and never had problems with it in  the past. It is possible that coreg is contributing, but it is a less likely offender for peripheral edema. Her BP was never controlled until coreg was initiated. I offered her a switch to hydralazine, but did not really encourage changing meds at this time. She will continue on her current regimen for now.  Her updated medication list for this problem includes:    Diltiazem Hcl Er Beads 360 Mg Xr24h-cap (Diltiazem hcl er beads) .Marland Kitchen... Take one tablet by mouth daily    Carvedilol 25 Mg Tabs (Carvedilol) .Marland Kitchen... Take 1 tablet by mouth two times a day    Aspirin 81 Mg Tabs (Aspirin) .Marland Kitchen... 1 by mouth once daily    Furosemide 40 Mg Tabs (Furosemide) .Marland Kitchen... Take one tablet by mouth daily.  Orders: EKG w/ Interpretation (93000)  Problem # 3:  HYPERLIPIDEMIA (ICD-272.4) lipids have been at goal. continue current therapy. Pt to establish with Dr Milinda Antis as her primary in the near future.  Her updated medication list for this problem includes:    Simvastatin 20 Mg Tabs (Simvastatin) ..... One tab by mouth daily  CHOL: 142 (03/01/2009)   LDL: 83 (03/01/2009)   HDL: 44.20 (03/01/2009)   TG: 74.0 (03/01/2009)  Patient Instructions: 1)  Your physician recommends that you schedule a follow-up appointment in: 4 MONTHS 2)  Your physician recommends that you continue on your current medications as directed. Please refer to the Current Medication list given to you today. 3)  Your physician has requested that you have an echocardiogram.  Echocardiography is a painless test that uses sound waves to create images of your heart. It provides your doctor with information about the size and shape of your heart and how well your heart's chambers and valves are working.  This procedure takes approximately one hour. There are no restrictions for this procedure. Prescriptions: DILTIAZEM HCL ER BEADS 360 MG XR24H-CAP (DILTIAZEM HCL ER BEADS) take one tablet by mouth daily  #90 x 3   Entered by:   Julieta Gutting, RN, BSN    Authorized by:   Norva Karvonen, MD   Signed by:   Julieta Gutting, RN, BSN on 07/11/2009   Method used:   Electronically to        [facility]* (mail-order)             ,          Ph: [phone]       Fax: [phone]   RxID:   [id number]

## 2010-06-27 NOTE — Letter (Signed)
Summary: [facility]   Imported By: Lanelle Bal 12/19/2009 12:06:33  _____________________________________________________________________  External Attachment:    Type:   Image     Comment:   External Document

## 2010-06-27 NOTE — Letter (Signed)
Summary: [facility]   Imported By: Lanelle Bal 01/27/2010 13:58:54  _____________________________________________________________________  External Attachment:    Type:   Image     Comment:   External Document

## 2010-06-27 NOTE — Progress Notes (Signed)
Summary: regarding labs  Phone Note Call from Patient Call back at Home Phone [phone]   Summary of Call: Pt is coming in for follow up next month and she is asking if she should come in for follow up prior. Initial call taken by: Lowella Petties CMA,  February 06, 2010 12:11 PM  Follow-up for Phone Call        yes - please schedule fasting lab for lipid/ast/alt/renal / tsh / cbc with diff 272, 401.1- thanks Follow-up by: Judith Part MD,  February 06, 2010 1:47 PM  Additional Follow-up for Phone Call Additional follow up Details #1::        Left message for patient to call back. Lewanda Rife LPN  February 06, 2010 2:39 PM   Patient notified as instructed by telephone. Fasting lab appointment scheduled as instructed 03/16/10 at 8:30am.Rena [facility] LPN  February 06, 2010 3:44 PM

## 2010-06-27 NOTE — Letter (Signed)
Summary: [facility]   Imported By: Lanelle Bal 11/16/2009 10:08:04  _____________________________________________________________________  External Attachment:    Type:   Image     Comment:   External Document

## 2010-06-27 NOTE — Assessment & Plan Note (Signed)
Summary: f41m    Visit Type:  4 months follow up Primary Provider:  Tower MD   CC:  Bilateral leg edema- tired all the time.  History of Present Illness: This is a 77 year-old woman with moderate AS, HTN, and hyperlipidemia presenting today for follow-up. 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 Her main complaints today are leg weakness, diffuse pain, and fatigue. She also complains of leg edema, worse as the day goes on. She denies chest pain, dyspnea, or syncope.   Current Medications (verified): 1)  Simvastatin 20 Mg  Tabs (Simvastatin) .... One Tab By Mouth Daily 2)  Diltiazem Hcl Er Beads 360 Mg Xr24h-Cap (Diltiazem Hcl Er Beads) .... Take One Tablet By Mouth Daily 3)  Carvedilol 25 Mg  Tabs (Carvedilol) .... Take 1 Tablet By Mouth Two Times A Day 4)  Aspirin 81 Mg  Tabs (Aspirin) .Marland Kitchen.. 1 By Mouth Once Daily 5)  Metamucil 30.9 % Powd (Psyllium) .Marland Kitchen.. 1  Dose Daily 6)  Furosemide 40 Mg Tabs (Furosemide) .... Take One Tablet By Mouth Daily. 7)  Potassium Chloride Crys Cr 20 Meq Cr-Tabs (Potassium Chloride Crys Cr) .... Take One Tablet By Mouth Twice A Day 8)  Vitamin D 1000 Unit  Tabs (Cholecalciferol) .... One Twice A Day  Allergies: 1)  ! Zetia (Ezetimibe) 2)  ! Pravachol (Pravastatin Sodium) 3)  ! * Altace 4)  ! Diovan 5)  ! * Calcium  Past History:  Past medical history reviewed for relevance to current acute and chronic problems.  Past Medical History: Current Problems:  CONSTIPATION (ICD-564.00) BLOOD IN STOOL (ICD-578.1) FAMILY HISTORY COLONIC POLYPS (ICD-V18.51) HYPERGLYCEMIA (ICD-790.29) HAND PAIN, LEFT (ICD-729.5) EDEMA- LOCALIZED (ICD-782.3) CARCINOMA, ENDOMETRIUM (ICD-182.0) SHOULDER PAIN, RIGHT (ICD-719.41) ADENOCARCINOMA, ENDOMETRIUM (ICD-182.0)-09 HEARING LOSS, LEFT EAR (ICD-389.9) CAROTID BRUITS, BILATERAL (ICD-785.9) AORTIC STENOSIS (ICD-424.1), moderately severe ALCOHOL ABUSE (ICD-305.00) ESOPHAGEAL SPASM (ICD-530.5) HIATAL HERNIA  (ICD-553.3) NICOTINE ADDICTION (ICD-305.1) HEMORRHOIDS, INTERNAL (ICD-455.0) EXTERNAL HEMORRHOIDS (ICD-455.3) OSTEOPENIA (ICD-733.90) DIVERTICULOSIS, COLON (ICD-562.10) POSTMENOPAUSAL BLEEDING (ICD-627.1) HYPERTENSION (ICD-401.9) HYPERLIPIDEMIA (ICD-272.4) breast bs      gyn-- [facility] -- Dr Shawnie Pons , also surgery from Dr Chestine Spore Sharol Given  cardiol-  Excell Seltzer  surgeon  Review of Systems       Negative except as per HPI   Vital Signs:  Patient profile:   77 year old female Height:      66 inches Weight:      194.25 pounds BMI:     31.47 Pulse rate:   72 / minute Pulse rhythm:   regular Resp:     18 per minute BP sitting:   126 / 76  (left arm) Cuff size:   large  Vitals Entered By: Vikki Ports (November 07, 2009 9:02 AM)  Physical Exam  General:  Pt is alert and oriented, in no acute distress. HEENT: normal Neck: normal carotid upstrokes with bilateral bruits, JVP normal Lungs: CTA CV: RRR with 3/6 systolic murmur at the RUSB Abd: soft, NT, positive BS, no bruit, no organomegaly Ext: trace bilateral pretibial edema. peripheral pulses 2+ and equal Skin: warm and dry without rash    EKG  Procedure date:  07/28/2009  Findings:      Left ventricle: The cavity size was normal. Wall thickness was     increased in a pattern of mild LVH. Systolic function was normal.     The estimated ejection fraction was in the range of 60% to 65%.     Features are consistent with a pseudonormal left  ventricular filling     pattern, with concomitant abnormal relaxation and increased filling     pressure (grade 2 diastolic dysfunction).            --------------------------------------------------------------------     Aortic valve: AV is thickened, calcified with restricted motion.     Peak and mean gradients through the valve are 66 and 42 mm Hg     respectively consistent with moderately severe to severeAS. Doppler:     No regurgitation.  VTI ratio of LVOT to aortic valve:  0.25. Valve     area: 0.71cm 2(VTI). Indexed valve area: 0.36cm 2/m 2 (VTI). Valve     area: 0.63cm 2 (Vmax). Indexed valve area: 0.32cm 2/m 2 (Vmax).     Mean gradient: 44mm Hg (S). Peak gradient: 71mm Hg (S).         Impression & Recommendations:  Problem # 1:  AORTIC STENOSIS (ICD-424.1) Pt stable without symptoms attributable to AS. She has moderately severe AS by echo, but her exam shows preserved closure of the second heart sound and I suspect her AS is not in the severe/critical range. Since she has no symptoms of severe AS, I think she can proceed with lumpectomy without further CV testing at a low risk of perioperative events.  Her updated medication list for this problem includes:    Carvedilol 25 Mg Tabs (Carvedilol) .Marland Kitchen... Take 1 tablet by mouth two times a day    Furosemide 40 Mg Tabs (Furosemide) .Marland Kitchen... Take one tablet by mouth daily.  Problem # 2:  HYPERLIPIDEMIA (ICD-272.4) Longstanding symptoms may be secondary to her statin. In absence of prior cardiovascular events, I would favor interrupting her statin to see if symptoms improve...she is highly sympotmatic as this remains her major complaint.  Her updated medication list for this problem includes:    Simvastatin 20 Mg Tabs (Simvastatin) ..... One tab by mouth daily (on hold)  CHOL: 143 (08/29/2009)   LDL: 74 (08/29/2009)   HDL: 49.20 (08/29/2009)   TG: 98.0 (08/29/2009)  Problem # 3:  HYPERTENSION (ICD-401.9) BP well-controlled.  Her updated medication list for this problem includes:    Diltiazem Hcl Er Beads 360 Mg Xr24h-cap (Diltiazem hcl er beads) .Marland Kitchen... Take one tablet by mouth daily    Carvedilol 25 Mg Tabs (Carvedilol) .Marland Kitchen... Take 1 tablet by mouth two times a day    Aspirin 81 Mg Tabs (Aspirin) .Marland Kitchen... 1 by mouth once daily    Furosemide 40 Mg Tabs (Furosemide) .Marland Kitchen... Take one tablet by mouth daily.  BP today: 126/76 Prior BP: 120/80 (09/21/2009)  Labs Reviewed: K+: 3.7 (03/01/2009) Creat: : 0.8 (03/01/2009)    Chol: 143 (08/29/2009)   HDL: 49.20 (08/29/2009)   LDL: 74 (08/29/2009)   TG: 98.0 (08/29/2009)  Patient Instructions: 1)  Your physician has recommended you make the following change in your medication: STOP Simvastatin (Zocor) for one month.  Please call our office in one month to let us know how you are feeling.  2)  Your physician wants you to follow-up in: 6 MONTHS.   You will receive a reminder letter in the mail two months in advance. If you don't receive a letter, please call our office to schedule the follow-up appointment. 3)  You are cleared from a cardiac standpoint for Lumpectomy.

## 2010-06-27 NOTE — Progress Notes (Signed)
Summary: [facility]   Imported By: Debby Freiberg 11/19/2009 10:35:52  _____________________________________________________________________  External Attachment:    Type:   Image     Comment:   External Document

## 2010-06-27 NOTE — Consult Note (Signed)
[facility]   Imported By: Roderic Ovens 07/18/2009 14:42:47  _____________________________________________________________________  External Attachment:    Type:   Image     Comment:   External Document

## 2010-06-27 NOTE — Assessment & Plan Note (Signed)
Summary: f29m   Visit Type:  Follow-up, 6-M Primary Provider:  Dr Milinda Antis  CC:  swelling.  History of Present Illness: This is a 77 year-old woman with moderate AS, HTN, and hyperlipidemia presenting today for follow-up. She is doing well at present. She notes home BP's ranging 120-130's/70-80's. She has been exercising 4 days per week at [employer]. She denies chest pain. She does complain of longstanding mild exertional dyspnea and leg swelling. We have tried several different diuretic combinations, but none have made an impact on her swelling. She denies lightheadedness or syncope.  Current Medications (verified): 1)  Diltiazem Hcl Er Beads 360 Mg Xr24h-Cap (Diltiazem Hcl Er Beads) .... Take One Tablet By Mouth Daily 2)  Carvedilol 25 Mg  Tabs (Carvedilol) .... Take 1 Tablet By Mouth Two Times A Day 3)  Aspirin 81 Mg  Tabs (Aspirin) .Marland Kitchen.. 1 By Mouth Once Daily 4)  Metamucil 30.9 % Powd (Psyllium) .Marland Kitchen.. 1  Dose Daily 5)  Furosemide 40 Mg Tabs (Furosemide) .... Take One Tablet By Mouth Daily. 6)  Potassium Chloride Crys Cr 20 Meq Cr-Tabs (Potassium Chloride Crys Cr) .... Take One Tablet By Mouth Twice A Day 7)  Vitamin D 1000 Unit  Tabs (Cholecalciferol) .... One Twice A Day 8)  Acetaminophen Pm 500-25 Mg Tabs (Diphenhydramine-Apap (Sleep)) .... Two Tablets By Mouth At Bedtime 9)  Fish Oil 1000 Mg Caps (Omega-3 Fatty Acids) .... Once Daily (900mg )  Allergies (verified): 1)  ! Zetia (Ezetimibe) 2)  ! Pravachol (Pravastatin Sodium) 3)  ! * Altace 4)  ! Diovan 5)  ! * Calcium 6)  ! Simvastatin  Past History:  Past medical history reviewed for relevance to current acute and chronic problems.  Past Medical History: Reviewed history from 12/07/2009 and no changes required. Current Problems:  CONSTIPATION (ICD-564.00) BLOOD IN STOOL (ICD-578.1) FAMILY HISTORY COLONIC POLYPS (ICD-V18.51) HYPERGLYCEMIA (ICD-790.29) HAND PAIN, LEFT (ICD-729.5) EDEMA- LOCALIZED (ICD-782.3) CARCINOMA,  ENDOMETRIUM (ICD-182.0) SHOULDER PAIN, RIGHT (ICD-719.41) ADENOCARCINOMA, ENDOMETRIUM (ICD-182.0)-09 HEARING LOSS, LEFT EAR (ICD-389.9) CAROTID BRUITS, BILATERAL (ICD-785.9) AORTIC STENOSIS (ICD-424.1), moderately severe ALCOHOL ABUSE (ICD-305.00) ESOPHAGEAL SPASM (ICD-530.5) HIATAL HERNIA (ICD-553.3) NICOTINE ADDICTION (ICD-305.1) HEMORRHOIDS, INTERNAL (ICD-455.0) EXTERNAL HEMORRHOIDS (ICD-455.3) OSTEOPENIA (ICD-733.90) DIVERTICULOSIS, COLON (ICD-562.10) POSTMENOPAUSAL BLEEDING (ICD-627.1) HYPERTENSION (ICD-401.9) HYPERLIPIDEMIA (ICD-272.4) breast cancer DCIS with lumpectomy     gyn-- [facility] -- Dr Shawnie Pons , also surgery from Dr Chestine Spore Sharol Given  cardiol-  Excell Seltzer  surgeon  Review of Systems       Negative except as per HPI   Vital Signs:  Patient profile:   77 year old female Height:      66 inches Weight:      195.50 pounds BMI:     31.67 Pulse rate:   61 / minute Resp:     18 per minute BP sitting:   158 / 86  (left arm) Cuff size:   regular  Vitals Entered By: Caralee Ates CMA (April 28, 2010 8:48 AM)  Physical Exam  General:  Pt is alert and oriented, in no acute distress. HEENT: normal Neck: normal carotid upstrokes with bilateral bruits, JVP normal Lungs: CTA CV: RRR with 3/6 systolic murmur at RUSB radiating to both carotids Abd: soft, NT, positive BS, no bruit, no organomegaly Ext: trace bilateral edema. peripheral pulses 2+ and equal Skin: warm and dry without rash     EKG  Procedure date:  04/28/2010  Findings:      NSR, LVH with repolarization changes, age-indeterminate septal infarct. HR 61 bpm.  Echocardiogram  Procedure date:  07/28/2009  Findings:      Left ventricle: The cavity size was normal. Wall thickness was     increased in a pattern of mild LVH. Systolic function was normal.     The estimated ejection fraction was in the range of 60% to 65%.     Features are consistent with a pseudonormal left ventricular filling      pattern, with concomitant abnormal relaxation and increased filling     pressure (grade 2 diastolic dysfunction).            --------------------------------------------------------------------     Aortic valve: AV is thickened, calcified with restricted motion.     Peak and mean gradients through the valve are 66 and 42 mm Hg     respectively consistent with moderately severe to severeAS. Doppler:     No regurgitation.  VTI ratio of LVOT to aortic valve: 0.25. Valve     area: 0.71cm 2(VTI). Indexed valve area: 0.36cm 2/m 2 (VTI). Valve     area: 0.63cm 2 (Vmax). Indexed valve area: 0.32cm 2/m 2 (Vmax).     Mean gradient: 44mm Hg (S). Peak gradient: 71mm Hg (S).  Impression & Recommendations:  Problem # 1:  AORTIC STENOSIS (ICD-424.1) The patient meets requirements for severe aortic stenosis with a mean gradient above 40 mmHg and aortic valve peak velocity greater than 4 m/s. She does not really have symptoms attributable to AS and tolerates exercise without exertional complaints. Dyspnea is longstanding and I suspect more related to her weight as she has had this for many years. Will repeat an echo at one year interval, March 2012 to evaluate for any progressive changes.  Her updated medication list for this problem includes:    Carvedilol 25 Mg Tabs (Carvedilol) .Marland Kitchen... Take 1 tablet by mouth two times a day    Furosemide 40 Mg Tabs (Furosemide) .Marland Kitchen... Take one tablet by mouth daily.    Hydrochlorothiazide 25 Mg Tabs (Hydrochlorothiazide) .Marland Kitchen... Take one tablet by mouth daily.  Orders: EKG w/ Interpretation (93000) Echocardiogram (Echo)  Problem # 2:  HYPERTENSION (ICD-401.9) BP controlled based on home readings. Continue current Rx.  Her updated medication list for this problem includes:    Diltiazem Hcl Er Beads 360 Mg Xr24h-cap (Diltiazem hcl er beads) .Marland Kitchen... Take one tablet by mouth daily    Carvedilol 25 Mg Tabs (Carvedilol) .Marland Kitchen... Take 1 tablet by mouth two times a day    Aspirin  81 Mg Tabs (Aspirin) .Marland Kitchen... 1 by mouth once daily    Furosemide 40 Mg Tabs (Furosemide) .Marland Kitchen... Take one tablet by mouth daily.    Hydrochlorothiazide 25 Mg Tabs (Hydrochlorothiazide) .Marland Kitchen... Take one tablet by mouth daily.  Orders: EKG w/ Interpretation (93000) Echocardiogram (Echo)  BP today: 158/86 Prior BP: 128/76 (03/20/2010)  Labs Reviewed: K+: 3.8 (03/16/2010) Creat: : 0.8 (03/16/2010)   Chol: 216 (03/16/2010)   HDL: 50.90 (03/16/2010)   LDL: 74 (08/29/2009)   TG: 119.0 (03/16/2010)  Problem # 3:  HYPERLIPIDEMIA (ICD-272.4) Intolerant to statins, zetia, and all lipid-lowering agents.  Orders: EKG w/ Interpretation (93000) Echocardiogram (Echo)  CHOL: 216 (03/16/2010)   LDL: 74 (08/29/2009)   HDL: 50.90 (03/16/2010)   TG: 119.0 (03/16/2010)  Patient Instructions: 1)  Your physician has recommended you make the following change in your medication: START HCTZ 25mg  one tablet by mouth daily, INCREASE Fish Oil to two capsules daily 2)  Your physician wants you to follow-up in:  6 MONTHS.  You will receive a reminder letter  in the mail two months in advance. If you don't receive a letter, please call our office to schedule the follow-up appointment. 3)  Your physician has requested that you have an echocardiogram in [facility] 2012.  Echocardiography is a painless test that uses sound waves to create images of your heart. It provides your doctor with information about the size and shape of your heart and how well your heart's chambers and valves are working.  This procedure takes approximately one hour. There are no restrictions for this procedure. Prescriptions: HYDROCHLOROTHIAZIDE 25 MG TABS (HYDROCHLOROTHIAZIDE) Take one tablet by mouth daily.  #90 x 3   Entered by:   Julieta Gutting, RN, BSN   Authorized by:   Norva Karvonen, MD   Signed by:   Julieta Gutting, RN, BSN on 04/28/2010   Method used:   Faxed to ...       [facility] (mail-order)             , [place]         Ph: [phone]        Fax: [phone]   RxID:   [id number]

## 2010-06-27 NOTE — Letter (Signed)
[facility]   Imported By: Maryln Gottron 12/13/2009 15:47:40  _____________________________________________________________________  External Attachment:    Type:   Image     Comment:   External Document

## 2010-06-27 NOTE — Assessment & Plan Note (Signed)
Summary: FOLLOW UP / LFW   Vital Signs:  Patient profile:   77 year old female Height:      66 inches Weight:      193.75 pounds BMI:     31.39 Temp:     98.7 degrees F oral Pulse rate:   60 / minute Pulse rhythm:   regular BP sitting:   120 / 80  (left arm) Cuff size:   large  Vitals Entered By: Lewanda Rife LPN (September 21, 2009 11:45 AM) CC: six month f/u 30 min appt ([facility] Pt)   History of Present Illness: here for f/u and to est as pt from [place] [facility] has hyperglycemia and lipids and HTN and osteopenia   wt is stable   hyperglycemia - is watching sugar this reading was 103 -- watches carbs carefully   bp well controlled 120/80 avoids salt  a lot of swelling  does have some otc supp hose - does wear them in cooler weather  carvedilol makes her swell - sees Dr Excell Seltzer - will talk to him about trying something else  has aortic stenosis -- is talking about heart valve repl in future  has regular echo  has to have cataract surgery-- trying to plan that   osteopenia - vit d level is 42 cannot tol calcium- due to constipation takes metamucil daily   lipids - recent good prof with trig 98/ hdl 49 and LDL 74 is eating healthy   has to see gyn Q 4 mo for endometrial cancer -- 2 years ago  former smoker  due mam in may- she will make her own appt   Allergies: 1)  ! Zetia (Ezetimibe) 2)  ! Pravachol (Pravastatin Sodium) 3)  ! * Altace 4)  ! Diovan 5)  ! * Calcium  Past History:  Past Surgical History: Last updated: 08/24/2008 colonoscopy 9/97 ECHO  nml 8/01 colonoscopy divertics 12/01 transthoracic ECHO LV function:  vigorous 6/07 carotid dopplers mild plaque bilateral/ aortic stenosis 12/06 nuclear stress test negative 10/07 bone densometry neg 5/04 2/08 positive total abd hysterectomy, bilst salpingo-oophrectomy and pelvic and paraaortic lymphadenectomy--11/04/07 R rotator cuff surg--05/2008  Family History: Last updated: 08/24/2008 Father:    Mother:  Siblings: sister with colon ployps--2010 No FH of Colon Cancer:  Maternal aunt had possible postmenopausal breast cancer  Social History: Last updated: 08/24/2008 Marital Status: Married Children:  Occupation:  Tobacco Use - Former.   Risk Factors: Exercise: yes (03/08/2009)  Risk Factors: Smoking Status: quit (08/24/2008)  Past Medical History: Current Problems:  CONSTIPATION (ICD-564.00) BLOOD IN STOOL (ICD-578.1) FAMILY HISTORY COLONIC POLYPS (ICD-V18.51) HYPERGLYCEMIA (ICD-790.29) HAND PAIN, LEFT (ICD-729.5) EDEMA- LOCALIZED (ICD-782.3) CARCINOMA, ENDOMETRIUM (ICD-182.0) SHOULDER PAIN, RIGHT (ICD-719.41) ADENOCARCINOMA, ENDOMETRIUM (ICD-182.0)-09 HEARING LOSS, LEFT EAR (ICD-389.9) CAROTID BRUITS, BILATERAL (ICD-785.9) AORTIC STENOSIS (ICD-424.1) ALCOHOL ABUSE (ICD-305.00) ESOPHAGEAL SPASM (ICD-530.5) HIATAL HERNIA (ICD-553.3) NICOTINE ADDICTION (ICD-305.1) HEMORRHOIDS, INTERNAL (ICD-455.0) EXTERNAL HEMORRHOIDS (ICD-455.3) OSTEOPENIA (ICD-733.90) DIVERTICULOSIS, COLON (ICD-562.10) POSTMENOPAUSAL BLEEDING (ICD-627.1) HYPERTENSION (ICD-401.9) HYPERLIPIDEMIA (ICD-272.4)     gyn-- [facility] -- Dr Shawnie Pons , also surgery from Dr Chestine Spore Sharol Given  cardiol-  Excell Seltzer   Review of Systems General:  Denies fatigue, fever, loss of appetite, and malaise. Eyes:  Denies blurring and eye irritation. CV:  Denies chest pain or discomfort, lightheadness, and palpitations. Resp:  Denies cough and wheezing. GI:  Denies abdominal pain, change in bowel habits, indigestion, and nausea. MS:  Denies joint redness, joint swelling, and muscle aches. Derm:  Denies lesion(s), poor wound healing, and rash.  Neuro:  Denies numbness and tingling. Psych:  Denies anxiety and depression. Endo:  Denies cold intolerance, excessive thirst, excessive urination, and heat intolerance. Heme:  Denies abnormal bruising and bleeding.  Physical Exam  General:  overweight but generally  well appearing  Head:  normocephalic, atraumatic, and no abnormalities observed.   Eyes:  vision grossly intact, pupils equal, pupils round, and pupils reactive to light.  no conjunctival pallor, injection or icterus  Ears:  R ear normal and L ear normal.   Nose:  no nasal discharge.   Mouth:  pharynx pink and moist.   Neck:  R carotid bruit and L carotid bruit.  (also heart M radiation)  Lungs:  Normal respiratory effort, chest expands symmetrically. Lungs are clear to auscultation, no crackles or wheezes. Heart:  normal rate.  harsh bruit heard throughout precardium, best heard at R upper sternal boarder Abdomen:  Bowel sounds positive,abdomen soft and non-tender without masses, organomegaly or hernias noted. no renal bruits  Msk:  No deformity or scoliosis noted of thoracic or lumbar spine.  no acute joint changes  Pulses:  R and L carotid,radial,femoral,dorsalis pedis and posterior tibial pulses are full and equal bilaterally Extremities:  1 + LE edema Bilat pedal Neurologic:  sensation intact to light touch, gait normal, and DTRs symmetrical and normal.   Skin:  Intact without suspicious lesions or rashes Cervical Nodes:  No lymphadenopathy noted Inguinal Nodes:  No significant adenopathy Psych:  normal affect, talkative and pleasant    Impression & Recommendations:  Problem # 1:  VITAMIN D DEFICIENCY (ICD-268.9) Assessment Improved now in nl range with 2000 international units per day vit D  will continue that ca as tolerated with constipation  Problem # 2:  DEPENDENT EDEMA, LEGS (ICD-782.3) Assessment: Deteriorated ongoing and poss med rel with beta blocker and ca channel blocker doing well with low salt diet  will disc med with Dr Excell Seltzer at f/u  in light of AS- likely cannot change anything at this time  Her updated medication list for this problem includes:    Furosemide 40 Mg Tabs (Furosemide) .Marland Kitchen... Take one tablet by mouth daily.  Problem # 3:  HYPERGLYCEMIA  (ICD-790.29) Assessment: Improved  sugar 103-- is controlling with diet at this time will continue to monitor disc healthy diet (low simple sugar/ choose complex carbs/ low sat fat) diet and exercise in detail   Labs Reviewed: Creat: 0.8 (03/01/2009)     Problem # 4:  AORTIC STENOSIS (ICD-424.1) Assessment: Unchanged overall per pt is asymptomatic and being followed closely by cardiol disc red flags to watch for  good bp control  Her updated medication list for this problem includes:    Carvedilol 25 Mg Tabs (Carvedilol) .Marland Kitchen... Take 1 tablet by mouth two times a day    Aspirin 81 Mg Tabs (Aspirin) .Marland Kitchen... 1 by mouth once daily  Problem # 5:  OSTEOPENIA (ICD-733.90) Assessment: Unchanged disc ca and vit D req - as tolerated  disc safety and exercise  Her updated medication list for this problem includes:    Calcium Carbonate-vitamin D 600-400 Mg-unit Tabs (Calcium carbonate-vitamin d) .Marland Kitchen... 1 by mouth once daily as needed    Vitamin D 1000 Unit Tabs (Cholecalciferol) ..... One twice a day  Problem # 6:  HYPERLIPIDEMIA (ICD-272.4) Assessment: Unchanged  good control with statin and diet rev lab today  rev low sat fat diet  f/u 6 mo  Her updated medication list for this problem includes:    Simvastatin 20 Mg Tabs (  Simvastatin) ..... One tab by mouth daily  Labs Reviewed: SGOT: 22 (08/29/2009)   SGPT: 27 (08/29/2009)   HDL:49.20 (08/29/2009), 44.20 (03/01/2009)  LDL:74 (08/29/2009), 83 (03/01/2009)  Chol:143 (08/29/2009), 142 (03/01/2009)  Trig:98.0 (08/29/2009), 74.0 (03/01/2009)  Complete Medication List: 1)  Simvastatin 20 Mg Tabs (Simvastatin) .... One tab by mouth daily 2)  Diltiazem Hcl Er Beads 360 Mg Xr24h-cap (Diltiazem hcl er beads) .... Take one tablet by mouth daily 3)  Carvedilol 25 Mg Tabs (Carvedilol) .... Take 1 tablet by mouth two times a day 4)  Aspirin 81 Mg Tabs (Aspirin) .Marland Kitchen.. 1 by mouth once daily 5)  Calcium Carbonate-vitamin D 600-400 Mg-unit Tabs  (Calcium carbonate-vitamin d) .Marland Kitchen.. 1 by mouth once daily as needed 6)  Metamucil 30.9 % Powd (Psyllium) .Marland Kitchen.. 1  dose daily 7)  Furosemide 40 Mg Tabs (Furosemide) .... Take one tablet by mouth daily. 8)  Potassium Chloride Crys Cr 20 Meq Cr-tabs (Potassium chloride crys cr) .... Take one tablet by mouth twice a day 9)  Vitamin D 1000 Unit Tabs (Cholecalciferol) .... One twice a day  Patient Instructions: 1)  do not forget to schedule your own mammogram in may  2)  no change in medications  3)  keep working on healthy low salt diet and drink enough water  4)  follow up with me in 6 months  Prescriptions: SIMVASTATIN 20 MG  TABS (SIMVASTATIN) one tab by mouth daily  #90 x 3   Entered and Authorized by:   Judith Part MD   Signed by:   Judith Part MD on 09/21/2009   Method used:   Print then Give to Patient   RxID:   [id number]   Current Allergies (reviewed today): ! ZETIA (EZETIMIBE) ! PRAVACHOL (PRAVASTATIN SODIUM) ! * ALTACE ! DIOVAN ! * CALCIUM   Preventive Care Screening  Pap Smear:    Date:  07/11/2009    Results:  normal

## 2010-06-27 NOTE — Progress Notes (Signed)
Summary: [facility] report  Rt breast biopsy  Phone Note From Other Clinic   Caller: [facility] Report Call For: Dr Milinda Antis Summary of Call: [facility] sent surgical path report. Is on your shelf in in box. Initial call taken by: Lewanda Rife LPN,  November 03, 3662 4:45 PM  Follow-up for Phone Call        aware of result and pt has upcoming breast surgeon consult  I will sign it to scan - thank you  Follow-up by: Judith Part MD,  November 02, 2009 5:30 PM

## 2010-06-27 NOTE — Progress Notes (Signed)
Summary: Surgical patholody report  Phone Note From Other Clinic   Caller: [facility] Call For: Dr Milinda Antis Summary of Call: Report for Surgical Pathology diagnosis for right breast lumpectomy and lymph node biopsy is on your shelf in the in box. Initial call taken by: Lewanda Rife LPN,  December 07, 2009 4:36 PM      Past Medical History:    Current Problems:     CONSTIPATION (ICD-564.00)    BLOOD IN STOOL (ICD-578.1)    FAMILY HISTORY COLONIC POLYPS (ICD-V18.51)    HYPERGLYCEMIA (ICD-790.29)    HAND PAIN, LEFT (ICD-729.5)    EDEMA- LOCALIZED (ICD-782.3)    CARCINOMA, ENDOMETRIUM (ICD-182.0)    SHOULDER PAIN, RIGHT (ICD-719.41)    ADENOCARCINOMA, ENDOMETRIUM (ICD-182.0)-09    HEARING LOSS, LEFT EAR (ICD-389.9)    CAROTID BRUITS, BILATERAL (ICD-785.9)    AORTIC STENOSIS (ICD-424.1), moderately severe    ALCOHOL ABUSE (ICD-305.00)    ESOPHAGEAL SPASM (ICD-530.5)    HIATAL HERNIA (ICD-553.3)    NICOTINE ADDICTION (ICD-305.1)    HEMORRHOIDS, INTERNAL (ICD-455.0)    EXTERNAL HEMORRHOIDS (ICD-455.3)    OSTEOPENIA (ICD-733.90)    DIVERTICULOSIS, COLON (ICD-562.10)    POSTMENOPAUSAL BLEEDING (ICD-627.1)    HYPERTENSION (ICD-401.9)    HYPERLIPIDEMIA (ICD-272.4)    breast cancer DCIS with lumpectomy                    gyn-- [facility] -- Dr Shawnie Pons , also surgery from Dr Chestine Spore Sharol Given     cardiol-  Excell Seltzer     surgeon  Past Surgical History:    colonoscopy 9/97    ECHO  nml 8/01    colonoscopy divertics 12/01    transthoracic ECHO LV function:  vigorous 6/07    carotid dopplers mild plaque bilateral/ aortic stenosis 12/06    nuclear stress test negative 10/07    bone densometry neg 5/04 2/08 positive    total abd hysterectomy, bilst salpingo-oophrectomy and pelvic and paraaortic lymphadenectomy--11/04/07    R rotator cuff surg--05/2008    5/11 breast bx DCIS-    7/11 breast lumpectomy for DCIS with neg sentinel LN bx

## 2010-06-27 NOTE — Miscellaneous (Signed)
  Clinical Lists Changes  Observations: Added new observation of ECHOINTERP: - Left ventricle: The cavity size was normal. Wall thickness was       increased in a pattern of mild LVH. Systolic function was normal.       The estimated ejection fraction was in the range of 60% to 65%.       Features are consistent with a pseudonormal left ventricular       filling pattern, with concomitant abnormal relaxation and       increased filling pressure (grade 2 diastolic dysfunction).     - Pericardium, extracardiac: A trivial pericardial effusion was       identified. (07/28/2009 11:16)      Echocardiogram  Procedure date:  07/28/2009  Findings:      - Left ventricle: The cavity size was normal. Wall thickness was       increased in a pattern of mild LVH. Systolic function was normal.       The estimated ejection fraction was in the range of 60% to 65%.       Features are consistent with a pseudonormal left ventricular       filling pattern, with concomitant abnormal relaxation and       increased filling pressure (grade 2 diastolic dysfunction).     - Pericardium, extracardiac: A trivial pericardial effusion was       identified.

## 2010-06-27 NOTE — Assessment & Plan Note (Signed)
Summary: F/U DLO   Vital Signs:  Patient profile:   77 year old female Height:      66 inches Weight:      193 pounds BMI:     31.26 Temp:     98.7 degrees F oral Pulse rate:   76 / minute Pulse rhythm:   regular BP sitting:   128 / 76  (left arm) Cuff size:   large  Vitals Entered By: Lewanda Rife LPN (March 20, 2010 9:07 AM) CC: follow-up visit   History of Present Illness: here for f/u of HTN and lipids and hyperglycemia   pt was dx with DCIS breast ca and lumpectomy since last visit  is healed up well  did the mammosite  ? radiaion -- 5 d two times a day and it was over  no chemo  no prev drugs yet -- is in deliberation about that  it was an estrogen responsive tumor    getting cataract surgery   128/76- good bp control  wt is down 1 lb  sugar 109 today- stable is watching sugar in diet  was eating ice cream at night occasionally    chol up LDL 147 from 74 reactions to many chol meds  quit taking statin due to the muscle pain   watches fat in diet some  watches sodium for swelling     Allergies: 1)  ! Zetia (Ezetimibe) 2)  ! Pravachol (Pravastatin Sodium) 3)  ! * Altace 4)  ! Diovan 5)  ! * Calcium 6)  ! Simvastatin  Review of Systems General:  Denies fatigue, loss of appetite, and malaise. Eyes:  Denies blurring and eye irritation. CV:  Denies chest pain or discomfort, lightheadness, and palpitations. Resp:  Denies cough and wheezing. GI:  Denies abdominal pain and change in bowel habits. GU:  Denies dysuria and urinary frequency. MS:  Denies joint pain, joint redness, and joint swelling. Derm:  Denies lesion(s), poor wound healing, and rash. Neuro:  Denies numbness and tingling. Psych:  Denies anxiety and depression. Endo:  Denies cold intolerance, excessive thirst, excessive urination, and heat intolerance. Heme:  Denies abnormal bruising and bleeding.  Physical Exam  General:  overweight but generally well appearing  Head:   normocephalic, atraumatic, and no abnormalities observed.   Eyes:  vision grossly intact, pupils equal, pupils round, and pupils reactive to light.  no conjunctival pallor, injection or icterus  Mouth:  pharynx pink and moist.   Neck:  supple with full rom and no masses or thyromegally, no JVD or carotid bruit  Chest Wall:  No deformities, masses, or tenderness noted. Lungs:  Normal respiratory effort, chest expands symmetrically. Lungs are clear to auscultation, no crackles or wheezes. Heart:  normal rate.  harsh bruit heard throughout precardium, best heard at R upper sternal boarder Abdomen:  Bowel sounds positive,abdomen soft and non-tender without masses, organomegaly or hernias noted. no renal bruits  Msk:  No deformity or scoliosis noted of thoracic or lumbar spine.  no acute joint changes  Pulses:  R and L carotid,radial,femoral,dorsalis pedis and posterior tibial pulses are full and equal bilaterally Extremities:  No clubbing, cyanosis, edema, or deformity noted with normal full range of motion of all joints.   Neurologic:  sensation intact to light touch, gait normal, and DTRs symmetrical and normal.   Skin:  Intact without suspicious lesions or rashes Cervical Nodes:  No lymphadenopathy noted Inguinal Nodes:  No significant adenopathy Psych:  normal affect, talkative and pleasant  Impression & Recommendations:  Problem # 1:  HYPERGLYCEMIA (ICD-790.29) Assessment Unchanged rev low glycemic diet and need for wt loss f/u 6 mo after labs  Problem # 2:  AORTIC STENOSIS (ICD-424.1) Assessment: Unchanged per pt - watching - no symptoms at this time Her updated medication list for this problem includes:    Carvedilol 25 Mg Tabs (Carvedilol) .Marland Kitchen... Take 1 tablet by mouth two times a day    Aspirin 81 Mg Tabs (Aspirin) .Marland Kitchen... 1 by mouth once daily  Problem # 3:  HYPERTENSION (ICD-401.9) Assessment: Unchanged good control on current meds  rev labs f/u 6 mo Her updated medication  list for this problem includes:    Diltiazem Hcl Er Beads 360 Mg Xr24h-cap (Diltiazem hcl er beads) .Marland Kitchen... Take one tablet by mouth daily    Carvedilol 25 Mg Tabs (Carvedilol) .Marland Kitchen... Take 1 tablet by mouth two times a day    Furosemide 40 Mg Tabs (Furosemide) .Marland Kitchen... Take one tablet by mouth daily.  Problem # 4:  HYPERLIPIDEMIA (ICD-272.4) Assessment: Deteriorated lipids up off statin  unable to tol any meds so far disc goal for LDL , HDL  disc exercise and low sat fat diet in detail  lab and f/u 6 mo   Complete Medication List: 1)  Diltiazem Hcl Er Beads 360 Mg Xr24h-cap (Diltiazem hcl er beads) .... Take one tablet by mouth daily 2)  Carvedilol 25 Mg Tabs (Carvedilol) .... Take 1 tablet by mouth two times a day 3)  Aspirin 81 Mg Tabs (Aspirin) .Marland Kitchen.. 1 by mouth once daily 4)  Metamucil 30.9 % Powd (Psyllium) .Marland Kitchen.. 1  dose daily 5)  Furosemide 40 Mg Tabs (Furosemide) .... Take one tablet by mouth daily. 6)  Potassium Chloride Crys Cr 20 Meq Cr-tabs (Potassium chloride crys cr) .... Take one tablet by mouth twice a day 7)  Vitamin D 1000 Unit Tabs (Cholecalciferol) .... One twice a day 8)  Acetaminophen Pm 500-25 Mg Tabs (Diphenhydramine-apap (sleep)) .... Two tablets by mouth at bedtime  Other Orders: Flu Vaccine 45yrs + MEDICARE PATIENTS (O1308) Administration Flu vaccine - MCR (M5784)  Patient Instructions: 1)  eat low fat and low sugar diet  2)  NO ICE CREAM 3)  you can raise your HDL (good cholesterol) by increasing exercise and eating omega 3 fatty acid supplement like fish oil or flax seed oil over the counter 4)  you can lower LDL (bad cholesterol) by limiting saturated fats in diet like red meat, fried foods, egg yolks, fatty breakfast meats, high fat dairy products and shellfish  5)  schedule fasting labs in 6 months lipid/ast/alt / AIC / renal 272, hyperglycemia and 401.1 6)  then follow up    Orders Added: 1)  Flu Vaccine 36yrs + MEDICARE PATIENTS [Q2039] 2)  Administration  Flu vaccine - MCR [G0008] 3)  Est. Patient Level IV [69629]    Current Allergies (reviewed today): ! ZETIA (EZETIMIBE) ! PRAVACHOL (PRAVASTATIN SODIUM) ! * ALTACE ! DIOVAN ! * CALCIUM ! SIMVASTATIN  Flu Vaccine Consent Questions     Do you have a history of severe allergic reactions to this vaccine? no    Any prior history of allergic reactions to egg and/or gelatin? no    Do you have a sensitivity to the preservative Thimersol? no    Do you have a past history of Guillan-Barre Syndrome? no    Do you currently have an acute febrile illness? no    Have you ever had a severe reaction to latex?  no    Vaccine information given and explained to patient? yes    Are you currently pregnant? no    Lot Number:[id number]   Exp Date:11/25/2010   Site Given  Left Deltoid [id number] Lewanda Rife LPN  March 20, 2010 9:12 AM

## 2010-06-27 NOTE — Progress Notes (Signed)
Summary: [facility]   Imported By: Debby Freiberg 11/19/2009 10:19:35  _____________________________________________________________________  External Attachment:    Type:   Image     Comment:   External Document

## 2010-06-27 NOTE — Letter (Signed)
Summary: [facility]   Imported By: Lanelle Bal 11/14/2009 09:48:17  _____________________________________________________________________  External Attachment:    Type:   Image     Comment:   External Document

## 2010-06-27 NOTE — Letter (Signed)
[facility]   Imported By: Lanelle Bal 12/28/2009 09:01:33  _____________________________________________________________________  External Attachment:    Type:   Image     Comment:   External Document

## 2010-06-27 NOTE — Progress Notes (Signed)
Summary: calling w/update after stopping med  Phone Note Call from Patient   Caller: Patient Reason for Call: Talk to Nurse Summary of Call: pt to suspend med for 30 days and call to let us know how she's doing-she feels so much better, "hasn't felt this good in years"-if you need to talk with her can be reached at [phone] Initial call taken by: Glynda Jaeger,  December 07, 2009 9:56 AM  Follow-up for Phone Call        I spoke with the pt and told her not to resume Simvastatin since she is feeling so well (the pt also takes Diltiazem).  The pt has Pravachol and Zetia listed as allergies. I will forward this information to Dr Excell Seltzer for further review.  Follow-up by: Julieta Gutting, RN, BSN,  December 07, 2009 3:04 PM  Additional Follow-up for Phone Call Additional follow up Details #1::        this is fine - just have her stay off of cholesterol lowering meds and follow a prudent diet. thx Additional Follow-up by: Norva Karvonen, MD,  December 09, 2009 8:13 AM   New Allergies: ! SIMVASTATIN Additional Follow-up for Phone Call Additional follow up Details #2::    Left messge to call back. Julieta Gutting, RN, BSN  December 09, 2009 10:33 AM  Pt aware that she needs to control cholesterol thru diet.  Follow-up by: Julieta Gutting, RN, BSN,  December 09, 2009 11:13 AM  New Allergies: ! SIMVASTATIN

## 2010-06-27 NOTE — Letter (Signed)
Summary: [facility]   Imported By: Lanelle Bal 12/07/2009 11:50:02  _____________________________________________________________________  External Attachment:    Type:   Image     Comment:   External Document

## 2010-06-27 NOTE — Letter (Signed)
Summary: Consult/[facility]   Imported By: Lanelle Bal 03/14/2010 11:24:39  _____________________________________________________________________  External Attachment:    Type:   Image     Comment:   External Document

## 2010-06-29 NOTE — Letter (Signed)
Summary: [facility]   Imported By: Lester Walker 05/30/2010 08:21:57  _____________________________________________________________________  External Attachment:    Type:   Image     Comment:   External Document  Appended Document: [facility] I have actually never seen this patient.  I am going to forward to Dr. Milinda Antis who I think is PCP - if you are, can you change in EMR?  Appended Document: [facility] yes -- she is mine --will change that -- ? how Nidal Rivet's name got on there...   Clinical Lists Changes

## 2010-06-29 NOTE — Assessment & Plan Note (Signed)
Summary: HIVES?   Vital Signs:  Patient profile:   77 year old female Weight:      196.25 pounds Temp:     99.3 degrees F oral Pulse rate:   80 / minute Pulse rhythm:   regular BP sitting:   140 / 80  (left arm) Cuff size:   large  Vitals Entered By: Selena Batten Dance CMA Duncan Dull) (May 23, 2010 3:59 PM) CC: ? Shingles   History of Present Illness: CC: "i think i have shingles"  coming on for quite a while.  Last Thursday (6d ago), break out started only on R side shoulderblade.  + back itching and pain.  + decreased appetite.  No h/o shingles before.  + chicken pox.  han't tried anything yet.  No fevers/chills, no oral lesions.  No n/v/d.  No abd pain.  Took left over pain med from surgery helped.  Scheduled for cataract surgery on Thursday.  Allergies: 1)  ! Zetia (Ezetimibe) 2)  ! Pravachol (Pravastatin Sodium) 3)  ! * Altace 4)  ! Diovan 5)  ! * Calcium 6)  ! Simvastatin  Past History:  Past Medical History: Last updated: 12/07/2009 Current Problems:  CONSTIPATION (ICD-564.00) BLOOD IN STOOL (ICD-578.1) FAMILY HISTORY COLONIC POLYPS (ICD-V18.51) HYPERGLYCEMIA (ICD-790.29) HAND PAIN, LEFT (ICD-729.5) EDEMA- LOCALIZED (ICD-782.3) CARCINOMA, ENDOMETRIUM (ICD-182.0) SHOULDER PAIN, RIGHT (ICD-719.41) ADENOCARCINOMA, ENDOMETRIUM (ICD-182.0)-09 HEARING LOSS, LEFT EAR (ICD-389.9) CAROTID BRUITS, BILATERAL (ICD-785.9) AORTIC STENOSIS (ICD-424.1), moderately severe ALCOHOL ABUSE (ICD-305.00) ESOPHAGEAL SPASM (ICD-530.5) HIATAL HERNIA (ICD-553.3) NICOTINE ADDICTION (ICD-305.1) HEMORRHOIDS, INTERNAL (ICD-455.0) EXTERNAL HEMORRHOIDS (ICD-455.3) OSTEOPENIA (ICD-733.90) DIVERTICULOSIS, COLON (ICD-562.10) POSTMENOPAUSAL BLEEDING (ICD-627.1) HYPERTENSION (ICD-401.9) HYPERLIPIDEMIA (ICD-272.4) breast cancer DCIS with lumpectomy     gyn-- [facility] -- Dr Shawnie Pons , also surgery from Dr Chestine Spore Sharol Given  cardiol-  Excell Seltzer  surgeon  Social History: Last updated:  08/24/2008 Marital Status: Married Children:  Occupation:  Tobacco Use - Former.   Review of Systems       per HPI  Physical Exam  General:  overweight but generally well appearing  Lungs:  Normal respiratory effort, chest expands symmetrically. Lungs are clear to auscultation, no crackles or wheezes. Heart:  normal rate.  harsh bruit heard throughout precardium, best heard at R upper sternal boarder Skin:  erythematous vesicular rash in clusters along R T4 dermatomal distribution   Impression & Recommendations:  Problem # 1:  SHINGLES (ICD-053.9) treat with acyclovir although 6 days into illness.  discussed anticipated course of disease.  pt has cataract surgery scheduled Thursday.  Complete Medication List: 1)  Diltiazem Hcl Er Beads 360 Mg Xr24h-cap (Diltiazem hcl er beads) .... Take one tablet by mouth daily 2)  Carvedilol 25 Mg Tabs (Carvedilol) .... Take 1 tablet by mouth two times a day 3)  Aspirin 81 Mg Tabs (Aspirin) .Marland Kitchen.. 1 by mouth once daily 4)  Metamucil 30.9 % Powd (Psyllium) .Marland Kitchen.. 1  dose daily 5)  Potassium Chloride Crys Cr 20 Meq Cr-tabs (Potassium chloride crys cr) .... Take one tablet by mouth twice a day 6)  Vitamin D 1000 Unit Tabs (Cholecalciferol) .... One twice a day 7)  Acetaminophen Pm 500-25 Mg Tabs (Diphenhydramine-apap (sleep)) .... Two tablets by mouth at bedtime 8)  Fish Oil 1000 Mg Caps (Omega-3 fatty acids) .... Take one capsule by mouth twice a day 9)  Hydrochlorothiazide 25 Mg Tabs (Hydrochlorothiazide) .... Take one tablet by mouth daily. 10)  Acyclovir 800 Mg Tabs (Acyclovir) .... Take one 5x/day for 7 days  Patient Instructions: 1)  looks like  shingles (handout provided). 2)  treat with antiviral x 7 days 5 times a day. 3)   let us know if not improving as expected. Prescriptions: ACYCLOVIR 800 MG TABS (ACYCLOVIR) take one 5x/day for 7 days  #35 x 0   Entered and Authorized by:   Eustaquio Boyden  MD   Signed by:   Eustaquio Boyden  MD on  05/23/2010   Method used:   Electronically to        [facility]  [address] [phone]* (retail)       [address]       [place], [place]  [id number]       Ph: [phone] or [phone]       Fax: [phone]   RxID:   [id number]    Orders Added: 1)  Est. Patient Level III [87564]    Current Allergies (reviewed today): ! ZETIA (EZETIMIBE) ! PRAVACHOL (PRAVASTATIN SODIUM) ! * ALTACE ! DIOVAN ! * CALCIUM ! SIMVASTATIN  Appended Document: Orders Update    Clinical Lists Changes  Orders: Added new Service order of Prescription Created Electronically [phone]) - Signed

## 2010-06-30 NOTE — Letter (Signed)
[facility]   Imported By: Maryln Gottron 01/10/2010 13:02:18  _____________________________________________________________________  External Attachment:    Type:   Image     Comment:   External Document

## 2010-07-04 ENCOUNTER — Ambulatory Visit: Payer: Medicare Other | Attending: Radiation Oncology | Admitting: Radiation Oncology

## 2010-07-04 ENCOUNTER — Emergency Department (HOSPITAL_COMMUNITY)
Admission: EM | Admit: 2010-07-04 | Discharge: 2010-07-04 | Disposition: A | Discharge status: Home / Self Care | Payer: Medicare Other | Attending: Emergency Medicine | Admitting: Emergency Medicine

## 2010-07-04 DIAGNOSIS — Z853 Personal history of malignant neoplasm of breast: Secondary | ICD-10-CM | POA: Insufficient documentation

## 2010-07-04 DIAGNOSIS — Y9229 Other specified public building as the place of occurrence of the external cause: Secondary | ICD-10-CM | POA: Insufficient documentation

## 2010-07-04 DIAGNOSIS — Z0389 Encounter for observation for other suspected diseases and conditions ruled out: Secondary | ICD-10-CM | POA: Insufficient documentation

## 2010-07-04 DIAGNOSIS — W19XXXA Unspecified fall, initial encounter: Secondary | ICD-10-CM | POA: Insufficient documentation

## 2010-07-04 DIAGNOSIS — E7800 Pure hypercholesterolemia, unspecified: Secondary | ICD-10-CM | POA: Insufficient documentation

## 2010-07-04 DIAGNOSIS — Z79899 Other long term (current) drug therapy: Secondary | ICD-10-CM | POA: Insufficient documentation

## 2010-07-04 DIAGNOSIS — I10 Essential (primary) hypertension: Secondary | ICD-10-CM | POA: Insufficient documentation

## 2010-07-11 ENCOUNTER — Other Ambulatory Visit: Payer: Self-pay | Admitting: Oncology

## 2010-07-11 ENCOUNTER — Encounter (HOSPITAL_BASED_OUTPATIENT_CLINIC_OR_DEPARTMENT_OTHER): Payer: Medicare Other | Admitting: Oncology

## 2010-07-11 ENCOUNTER — Encounter: Payer: Self-pay | Admitting: Family Medicine

## 2010-07-11 DIAGNOSIS — Z170 Estrogen receptor positive status [ER+]: Secondary | ICD-10-CM

## 2010-07-11 DIAGNOSIS — C50419 Malignant neoplasm of upper-outer quadrant of unspecified female breast: Secondary | ICD-10-CM

## 2010-07-11 DIAGNOSIS — D0590 Unspecified type of carcinoma in situ of unspecified breast: Secondary | ICD-10-CM

## 2010-07-11 DIAGNOSIS — C549 Malignant neoplasm of corpus uteri, unspecified: Secondary | ICD-10-CM

## 2010-07-11 LAB — CBC WITH DIFFERENTIAL/PLATELET
BASO%: 0.5 % (ref 0.0–2.0)
LYMPH%: 18.3 % (ref 14.0–49.7)
MCHC: 34.6 g/dL (ref 31.5–36.0)
MONO#: 0.5 10*3/uL (ref 0.1–0.9)
MONO%: 8.7 % (ref 0.0–14.0)
Platelets: 157 10*3/uL (ref 145–400)
RBC: 4.56 10*6/uL (ref 3.70–5.45)
RDW: 14.1 % (ref 11.2–14.5)
WBC: 5.3 10*3/uL (ref 3.9–10.3)

## 2010-07-11 LAB — COMPREHENSIVE METABOLIC PANEL
ALT: 27 U/L (ref 0–35)
Alkaline Phosphatase: 58 U/L (ref 39–117)
CO2: 28 mEq/L (ref 19–32)
Sodium: 140 mEq/L (ref 135–145)
Total Bilirubin: 0.5 mg/dL (ref 0.3–1.2)
Total Protein: 6.6 g/dL (ref 6.0–8.3)

## 2010-07-25 NOTE — Letter (Signed)
Summary: [facility]  [facility]   Imported By: Lanelle Bal 07/20/2010 14:30:58  _____________________________________________________________________  External Attachment:    Type:   Image     Comment:   External Document

## 2010-07-31 ENCOUNTER — Ambulatory Visit (HOSPITAL_COMMUNITY): Payer: Medicare Other | Attending: Cardiovascular Disease

## 2010-07-31 DIAGNOSIS — E785 Hyperlipidemia, unspecified: Secondary | ICD-10-CM | POA: Insufficient documentation

## 2010-07-31 DIAGNOSIS — I10 Essential (primary) hypertension: Secondary | ICD-10-CM | POA: Insufficient documentation

## 2010-07-31 DIAGNOSIS — I359 Nonrheumatic aortic valve disorder, unspecified: Secondary | ICD-10-CM

## 2010-08-02 ENCOUNTER — Encounter: Payer: Self-pay | Admitting: Family Medicine

## 2010-08-13 LAB — DIFFERENTIAL
Basophils Absolute: 0 10*3/uL (ref 0.0–0.1)
Basophils Relative: 1 % (ref 0–1)
Eosinophils Relative: 2 % (ref 0–5)
Lymphocytes Relative: 22 % (ref 12–46)
Monocytes Absolute: 0.6 10*3/uL (ref 0.1–1.0)
Neutro Abs: 3.8 10*3/uL (ref 1.7–7.7)

## 2010-08-13 LAB — COMPREHENSIVE METABOLIC PANEL
AST: 25 U/L (ref 0–37)
Albumin: 4.1 g/dL (ref 3.5–5.2)
Alkaline Phosphatase: 68 U/L (ref 39–117)
BUN: 14 mg/dL (ref 6–23)
CO2: 30 mEq/L (ref 19–32)
Chloride: 105 mEq/L (ref 96–112)
Creatinine, Ser: 0.84 mg/dL (ref 0.4–1.2)
GFR calc non Af Amer: >60 mL/min (ref >60)
Potassium: 4 mEq/L (ref 3.5–5.1)
Total Bilirubin: 0.5 mg/dL (ref 0.3–1.2)

## 2010-08-13 LAB — CBC
Hemoglobin: 14.6 g/dL (ref 12.0–15.0)
MCH: 30.4 pg (ref 26.0–34.0)
MCV: 87.7 fL (ref 78.0–100.0)
RBC: 4.8 MIL/uL (ref 3.87–5.11)
WBC: 5.9 10*3/uL (ref 4.0–10.5)

## 2010-08-13 LAB — URINALYSIS, ROUTINE W REFLEX MICROSCOPIC
Bilirubin Urine: NEGATIVE
Glucose, UA: NEGATIVE mg/dL
Hgb urine dipstick: NEGATIVE
Specific Gravity, Urine: 1.008 (ref 1.005–1.030)
pH: 5 (ref 5.0–8.0)

## 2010-08-17 ENCOUNTER — Telehealth: Payer: Self-pay | Admitting: Cardiovascular Disease

## 2010-08-17 NOTE — Telephone Encounter (Signed)
Pt aware of 07/31/10 echo results by phone. Pt scheduled to see Dr Excell Seltzer on 10/30/10.

## 2010-08-29 ENCOUNTER — Ambulatory Visit: Payer: Medicare Other | Admitting: Obstetrics and Gynecology

## 2010-08-29 ENCOUNTER — Other Ambulatory Visit (HOSPITAL_COMMUNITY)
Admission: RE | Admit: 2010-08-29 | Discharge: 2010-08-29 | Disposition: A | Discharge status: Home / Self Care | Payer: Medicare Other | Source: Ambulatory Visit | Attending: Family Medicine | Admitting: Family Medicine

## 2010-08-29 DIAGNOSIS — C549 Malignant neoplasm of corpus uteri, unspecified: Secondary | ICD-10-CM

## 2010-08-29 DIAGNOSIS — Z124 Encounter for screening for malignant neoplasm of cervix: Secondary | ICD-10-CM | POA: Insufficient documentation

## 2010-08-29 LAB — CBC
HCT: 42.5 % (ref 36.0–46.0)
Platelets: 172 10*3/uL (ref 150–400)
RDW: 13.5 % (ref 11.5–15.5)
WBC: 7.2 10*3/uL (ref 4.0–10.5)

## 2010-08-29 LAB — POCT I-STAT, CHEM 8
BUN: 26 mg/dL — ABNORMAL HIGH (ref 6–23)
Chloride: 101 mEq/L (ref 96–112)
Creatinine, Ser: 0.8 mg/dL (ref 0.4–1.2)
Potassium: 2.9 mEq/L — ABNORMAL LOW (ref 3.5–5.1)
Sodium: 138 mEq/L (ref 135–145)

## 2010-08-29 LAB — DIFFERENTIAL
Basophils Absolute: 0 10*3/uL (ref 0.0–0.1)
Eosinophils Relative: 0 % (ref 0–5)
Lymphocytes Relative: 15 % (ref 12–46)
Lymphs Abs: 1.1 10*3/uL (ref 0.7–4.0)
Neutro Abs: 5.6 10*3/uL (ref 1.7–7.7)

## 2010-08-29 LAB — POCT CARDIAC MARKERS
CKMB, poc: 1.5 ng/mL (ref 1.0–8.0)
Myoglobin, poc: 36.3 ng/mL (ref 12–200)
Troponin i, poc: <0.05 ng/mL (ref 0.00–0.09)

## 2010-08-31 NOTE — Assessment & Plan Note (Signed)
NAME:  [name], [name] NO.:  [id number]  MEDICAL RECORD NO.:  [id number]           PATIENT TYPE:  LOCATION:  [facility]           FACILITY:  PHYSICIAN:  Tinnie Gens, MD        DATE OF BIRTH:  13-Nov-1933  DATE OF SERVICE:  08/29/2010                                 CLINIC NOTE  CHIEF COMPLAINT:  Repeat Pap.  HISTORY OF PRESENT ILLNESS:  The patient is a 77 year old gravida 2, para 2 who has undergone TAH-BSO, pelvic and paraaortic lymphadenectomy for adenocarcinoma of the endometrium.  The patient declined postoperative radiation.  She then had primary breast cancer diagnosed and she has undergone lumpectomy and MammoSite internal radiation as apposed to external beam radiation for followup visit in 1 year for that.  She is without complaint.  No abdominal pain.  She passes her stool and urine fine.  She has had no vaginal or rectal bleeding.  PHYSICAL EXAMINATION:  VITAL SIGNS:  As noted in the chart. GENERAL:  She is a well-developed, well-nourished female in no acute distress. GU:  Pale vagina and external genitalia.  The cervix and uterus are absent.  There is no mass of the cuff.  Pap smear is obtained.  IMPRESSION: 1. Endometrial carcinoma of the uterus in 2009 for followup Pap with     [place]. 2. Pap smears complaint today.  PLAN:  Follow up.  Next Pap will be with Dr. De Blanch.  We will address with about BRCA testing with this patient as well.          ______________________________ Tinnie Gens, MD    TP/MEDQ  D:  08/29/2010  T:  08/30/2010  Job:  [id number]

## 2010-09-11 ENCOUNTER — Other Ambulatory Visit: Payer: Self-pay | Admitting: *Deleted

## 2010-09-11 DIAGNOSIS — R739 Hyperglycemia, unspecified: Secondary | ICD-10-CM

## 2010-09-11 DIAGNOSIS — E7800 Pure hypercholesterolemia, unspecified: Secondary | ICD-10-CM

## 2010-09-11 DIAGNOSIS — I10 Essential (primary) hypertension: Secondary | ICD-10-CM

## 2010-09-11 LAB — CBC
MCV: 90.2 fL (ref 78.0–100.0)
Platelets: 154 10*3/uL (ref 150–400)
RBC: 4.68 MIL/uL (ref 3.87–5.11)
WBC: 5.7 10*3/uL (ref 4.0–10.5)

## 2010-09-18 ENCOUNTER — Other Ambulatory Visit (INDEPENDENT_AMBULATORY_CARE_PROVIDER_SITE_OTHER): Payer: Medicare Other | Admitting: Family Medicine

## 2010-09-18 DIAGNOSIS — E7800 Pure hypercholesterolemia, unspecified: Secondary | ICD-10-CM

## 2010-09-18 DIAGNOSIS — R739 Hyperglycemia, unspecified: Secondary | ICD-10-CM

## 2010-09-18 DIAGNOSIS — R7309 Other abnormal glucose: Secondary | ICD-10-CM

## 2010-09-18 DIAGNOSIS — I10 Essential (primary) hypertension: Secondary | ICD-10-CM

## 2010-09-18 LAB — RENAL FUNCTION PANEL
Albumin: 3.8 g/dL (ref 3.5–5.2)
CO2: 29 mEq/L (ref 19–32)
Calcium: 9.1 mg/dL (ref 8.4–10.5)
Creatinine, Ser: 0.8 mg/dL (ref 0.4–1.2)
Sodium: 141 mEq/L (ref 135–145)

## 2010-09-18 LAB — LIPID PANEL
Cholesterol: 142 mg/dL (ref 0–200)
LDL Cholesterol: 80 mg/dL (ref 0–99)
Triglycerides: 63 mg/dL (ref 0.0–149.0)
VLDL: 12.6 mg/dL (ref 0.0–40.0)

## 2010-09-25 ENCOUNTER — Ambulatory Visit (INDEPENDENT_AMBULATORY_CARE_PROVIDER_SITE_OTHER): Payer: Medicare Other | Admitting: Family Medicine

## 2010-09-25 ENCOUNTER — Encounter: Payer: Self-pay | Admitting: Family Medicine

## 2010-09-25 DIAGNOSIS — I359 Nonrheumatic aortic valve disorder, unspecified: Secondary | ICD-10-CM

## 2010-09-25 DIAGNOSIS — R7309 Other abnormal glucose: Secondary | ICD-10-CM

## 2010-09-25 DIAGNOSIS — I10 Essential (primary) hypertension: Secondary | ICD-10-CM

## 2010-09-25 DIAGNOSIS — E785 Hyperlipidemia, unspecified: Secondary | ICD-10-CM

## 2010-09-25 MED ORDER — HYDROCORTISONE 2.5 % RE CREA: TOPICAL_CREAM | Freq: Two times a day (BID) | RECTAL | Status: AC | PRN

## 2010-09-25 NOTE — Patient Instructions (Signed)
Keep up the good work with exercise and healthy diet (low fat and low sugar )  Continue follow up with cardiologist  Numbers look good !  Schedule 30 minute check up in 6 months with labs prior

## 2010-09-25 NOTE — Assessment & Plan Note (Signed)
Well controlled without problems or change in med Enc regular work outs Continue f/u with cardiol about AS

## 2010-09-25 NOTE — Assessment & Plan Note (Signed)
Well controlled with diet and no med at all besides fish oil Enc to continue low sat fat diet and exercise Re check 6 mo with check up

## 2010-09-25 NOTE — Progress Notes (Signed)
Subjective:    Patient ID: [name], female    DOB: March 02, 1934, 77 y.o.   MRN: [id number]  HPI Here for f/u of lipids and hyperglycemia and HTN  Is feeling tired in general -- has had a tough year   Husband was gone for over a month - made a miraculous recovery after pneumonia and hospice care   Is going to decide a plan on her aortic valve with Dr Excell Seltzer -- has become severe  Keeps her tired but not short of breath  Goes to her work out - every am that she can  Stays swollen in her legs and ankles - worse than it used to be Watches her sodium very closely  She wears supp stockings when weather is not hot   Wt is stable - over wt  She really wants to loose weight -- making a plan for that   Lipids are very good with LDL 80- no med Inc her fish oil to bid  Cut her fats in diet - eats a lot of oatmeal and cheerios  Lab Results  Component Value Date   CHOL 142 09/18/2010   CHOL 216* 03/16/2010   CHOL 143 08/29/2009   Lab Results  Component Value Date   HDL 49.00 09/18/2010   HDL 08.65 03/16/2010   HDL 49.20 08/29/2009   Lab Results  Component Value Date   LDLCALC 80 09/18/2010   LDLCALC 74 08/29/2009   LDLCALC 83 03/01/2009   Lab Results  Component Value Date   TRIG 63.0 09/18/2010   TRIG 119.0 03/16/2010   TRIG 98.0 08/29/2009   Lab Results  Component Value Date   CHOLHDL 3 09/18/2010   CHOLHDL 4 03/16/2010   CHOLHDL 3 08/29/2009     A1c is 5.2 -- very good after mildly high fasting sugar Diet -- is fair - wants to really work on that  Less sweets than in past but could do better   HTN in good control 132/72 No ha or cp or edema  No med side eff  Past Medical History  Diagnosis Date  . Unspecified constipation   . Blood in stool   . Family history of colonic polyps   . Other abnormal glucose   . Pain in limb   . Edema   . Malignant neoplasm of corpus uteri, except isthmus   . Pain in joint, shoulder region   . Malignant neoplasm of corpus uteri, except  isthmus   . Unspecified hearing loss   . Other symptoms involving cardiovascular system 12/1999    Echo, normal. Transthoracic echo LV function: vigorous  10/2005  . Aortic valve disorders 04/2005    carotid dopplers- mild plague bilateral/ aortic stenosis  . Alcohol abuse, unspecified   . Dyskinesia of esophagus   . Diaphragmatic hernia without mention of obstruction or gangrene   . Tobacco use disorder   . Internal hemorrhoids without mention of complication   . External hemorrhoids without mention of complication   . Disorder of bone and cartilage, unspecified   . Diverticulosis of colon (without mention of hemorrhage)   . Postmenopausal bleeding   . Unspecified essential hypertension   . Other and unspecified hyperlipidemia    Past Surgical History  Procedure Date  . Rotator cuff repair 05/2008    right  . 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 Breast lumpectomy 11/2009    DCIS, neg sentinel lymph node biopsy  . Laparoscopy w/ total bilateral pelvic and peri-aortic  lymphadenectomy 11/04/2007  . Total abdominal hysterectomy w/ bilateral salpingoophorectomy 11/04/07    salpingo-oophrectomy  . Cataract extraction     reports that she quit smoking about 11 years ago. She does not have any smokeless tobacco history on file. Her alcohol and drug histories not on file. family history includes Cancer in her maternal aunt and Colon polyps in her sister. Allergies  Allergen Reactions  . Calcium     REACTION: constipation  . Ezetimibe     REACTION: fatigue  . Ramipril     REACTION: abdominal pain  . Simvastatin     REACTION: leg pain and body aches  . Valsartan     REACTION: abdominal pain       Review of Systems Review of Systems  Constitutional: Negative for fever, appetite change,  and unexpected weight change.  Eyes: Negative for pain and visual disturbance.  Respiratory: Negative for cough and shortness of breath.   Cardiovascular: Negative.   Gastrointestinal: Negative for  nausea, diarrhea and constipation.  Genitourinary: Negative for urgency and frequency.  Skin: Negative for pallor.  Neurological: Negative for weakness, light-headedness, numbness and headaches.  Hematological: Negative for adenopathy. Does not bruise/bleed easily.  Psychiatric/Behavioral: Negative for dysphoric mood. The patient is not nervous/anxious.          Objective:   Physical Exam  Constitutional: She appears well-developed and well-nourished.       overwt and well appearing   HENT:  Head: Normocephalic and atraumatic.  Mouth/Throat: Oropharynx is clear and moist.  Eyes: Conjunctivae and EOM are normal. Pupils are equal, round, and reactive to light.  Neck: Normal range of motion. Neck supple. No JVD present. Carotid bruit is present. No thyromegaly present.       Carotid bruit sounds are likely M referring to that  Area   Cardiovascular: Normal rate and regular rhythm.  Exam reveals no gallop and no friction rub.   Murmur heard. Pulmonary/Chest: No respiratory distress. She has no wheezes. She has no rales. She exhibits no tenderness.  Abdominal: Soft. Bowel sounds are normal. She exhibits no distension, no abdominal bruit and no mass. There is no tenderness.  Musculoskeletal: Normal range of motion. She exhibits no edema and no tenderness.  Lymphadenopathy:    She has no cervical adenopathy.  Neurological: She is alert. She has normal reflexes.  Skin: Skin is warm and dry. No rash noted. No erythema. No pallor.  Psychiatric: She has a normal mood and affect.          Assessment & Plan:

## 2010-09-25 NOTE — Assessment & Plan Note (Signed)
Good control with diet  A1c well below 6  Disc plan for wt loss with smaller portions

## 2010-09-25 NOTE — Assessment & Plan Note (Signed)
Worsening on echo Clinically- edema is only symptom Suspect will need surg in future Will disc with her cardiol ongoing

## 2010-10-10 ENCOUNTER — Encounter (INDEPENDENT_AMBULATORY_CARE_PROVIDER_SITE_OTHER): Payer: Self-pay | Admitting: General Surgery

## 2010-10-10 NOTE — Op Note (Signed)
[name], [name] NO.:  [id number]   MEDICAL RECORD NO.:  [id number]          PATIENT TYPE:  AMB   LOCATION:  [facility]                         FACILITY:  [facility]   PHYSICIAN:  Jene Every, M.D.    DATE OF BIRTH:  Jun 12, 1933   DATE OF PROCEDURE:  06/14/2008  DATE OF DISCHARGE:                               OPERATIVE REPORT   PREOPERATIVE DIAGNOSIS:  Rotator cuff tear retracted, right shoulder,  impingement syndrome.   POSTOPERATIVE DIAGNOSIS:  Rotator cuff tear retracted, right shoulder,  impingement syndrome.   PROCEDURE PERFORMED:  1. Right open rotator cuff repair massive.  2. Utilization of TissueMend patch graft.  3. Subacromial decompression.  4. Debridement of glenohumeral joint.   BRIEF HISTORY AND INDICATIONS:  A 77 year old with retracted rotator  cuff tear and impingement pain, weakness.  MRI indicating retracted  tear.  She is indicated for open rotator cuff repair, possible distal  clavicle resection, acromioplasty, possible patch graft.  Risks and  benefits were discussed including bleeding, infection, suboptimal range  of motion, need for prolonged immobilization, infection, etc.   TECHNIQUE:  The patient in supine beach-chair position after induction  of adequate anesthesia and 2 grams Kefzol, right shoulder and upper  extremity prepped and draped in the usual sterile fashion.  Range of  motion was satisfactory.  Incision was made over the anterolateral  aspect of the acromion.  Subcutaneous tissue was dissected and  electrocautery was utilized to achieve hemostasis.  The raphe between  the anterolateral heads of the deltoid was identified and divided,  subperiosteally elevated from the anterolateral and anteromedial aspect  of the acromion, less than 3 cm over the anterolateral aspect of the  acromion.  CA ligament was divided and excised.  Hypertrophic bursa was  excised.  A large anterolateral spur was then removed with an  oscillating  saw and a Theatre manager.  We had preserved the deltoid  attachment.  Next, a retracted tear was noted retracted to the  glenohumeral joint.  With digital lysis, I was able to mobilized it to  the greater tuberosity though it required abduction of the arm to about  30 degrees.  With a Matt Holmes rongeur preparing the bed in the cancellus bone  and removing just 0.5 cm of the lateral articular surface, improved the  footprint.  I did feel however, that the lateral edge of this cuff was  attenuated and felt that a patch graft would be appropriate to augment  that.  I then obtained a TissueMend patch graft 3 x 3.  I contoured that  and doubled it over and placed it lateral in the bed to reinforce the  end the tendon when it was advanced laterally so I could keep the arm in  the resting position.  I put two 55 Bio-Anchor screws into the greater  tuberosity at the articular edge.  This was after the awl tap insertion  of the screw with excellent purchase.  These were threaded up through  the tendon, the surgeon's knot tied over that and over the edge of the  greater  tuberosity using an awl for two push locks.  The TissueMend  again was rolled over and underneath the leaflets of the TissueMend and  then secured by crossing the leaflets and inserting them into the push  lock with excellent coverage and full coverage of the head and  augmentation of the lateral margin of the rotator cuff.  Good range of  motion, full coverage was noted.  Subacromial evaluation revealed no  impingement from the clavicle.  She had good range.  The wound was  copiously irrigated.  No active bleeding.  0.25% Marcaine with  epinephrine was infiltrated in the subcutaneous tissue.  The bursa had  been excised as well.  The raphe was repaired with #1 Vicryl figure-of-  eight suture over the top and through the acromion.  Subcutaneous tissue  reapproximated with simple sutures.  Skin was reapproximated with 4-0  subcuticular  Prolene.  Wound reinforced with Steri-Strips.  Sterile  dressing applied.  Patient  placed in an abduction  pillow small, extubated without difficulty and transported to recovery  in satisfactory condition.  The patient tolerated the procedure well with no complications.   ASSISTANT:  Roma Schanz, P.A.      Jene Every, M.D.  Electronically Signed     JB/MEDQ  D:  06/14/2008  T:  06/14/2008  Job:  [id number]

## 2010-10-10 NOTE — Letter (Signed)
October 30, 2006    Arta Silence, MD  [address] [place], [place] [address]   RE:  [name], [name]  MRN:  [id number]  /  DOB:  [birth year]   Dear Nadine Counts,   It was a pleasure to see your nice patient, [name], for followup on  October 30, 2006. As you know, she is a very pleasant, 77 year old, white  female with moderate aortic stenosis. She is asymptomatic. She also has  hypertension which has not been well controlled.   She has hyperlipidemia which is controlled, carotid bruits probably  transmitted.   Her last visit, we added Altace 10.   Additional medications include:  1. Hydrochlorothiazide 12.5.  2. Aspirin 162.  3. Diltiazem 360.  4. Simvastatin 20.  5. Benadryl 50.   The patient states that she has got some leg pain. She discontinued  Altace but restarted it 2 days ago.   PHYSICAL EXAMINATION:  VITAL SIGNS:  Blood pressure 158/83, pulse 85,  normal sinus rhythm.  GENERAL APPEARANCE:  Normal.  NECK:  JVP is not elevated. Carotid pulses palpable and equal. Short  bruits bilaterally probably transmitted.  LUNGS:  Clear.  CARDIAC:  Reveals a 3/6 moderate length systolic ejection murmur in the  aortic area, no diastolic murmur.  ABDOMEN:  Unremarkable.  EXTREMITIES:  Normal.   EKG is note repeated at this time but revealed moderate voltage criteria  for LVH in April.   The present 2-D echo reveals increase in peak aortic valve velocity from  358 to 396, the mean aortic gradient went from 30 to 34 and repeat 51 to  63 in the past year.   IMPRESSION:  1. Moderate to moderately severe aortic stenosis - asymptomatic.  2. Hypertension poorly controlled.   Since she has just restarted the Altace, I think we will follow this  closely. She will be seeing you a few weeks to for her yearly physical  and I suggested she see Dr. [name] for continued cardiology  followup.   Thank you for the opportunity to share in this nice patient's care.    Sincerely,      E. Graceann Congress, MD, [facility]  Electronically Signed    EJL/MedQ  DD: 10/30/2006  DT: 10/30/2006  Job #: [id number]

## 2010-10-10 NOTE — Consult Note (Signed)
[name], [name]                  ACCOUNT NO.:  [id number]   MEDICAL RECORD NO.:  [id number]          PATIENT TYPE:  POB   LOCATION:  [facility]                          FACILITY:  [facility]   PHYSICIAN:  De Blanch, M.D.DATE OF BIRTH:  Dec 14, 1933   DATE OF CONSULTATION:  11/19/2008  DATE OF DISCHARGE:  09/28/2008                                 CONSULTATION   CHIEF COMPLAINT:  Endometrial cancer, vaginal spotting.   INTERVAL HISTORY:  Since her last visit the patient has done well.  She  did have 3 days of spotting in the early morning hours in May.  Subsequently there has been no further spotting.  She denies any pelvic  pain, pressure, but she does have known hemorrhoids.  She does not think  these were the cause of bleeding and have not been symptomatic.  Overall  her functional status has been excellent.   HISTORY OF PRESENT ILLNESS:  Stage I-b grade 2 endometrial cancer  undergoing TAH-BSO, pelvic and periaortic lymphadenectomy June 2009.  She was offered postoperative vaginal vault brachytherapy but had  declined the brachytherapy.  Since her last visit she has done well.  She denies any GI or GU symptoms, has no pelvic pain, pressure, vaginal  bleeding or discharge.   PAST MEDICAL HISTORY AND MEDICAL ILLNESSES:  hypertension,  hyperlipidemia, and aortic stenosis.   PAST SURGICAL HISTORY:  1. TAH-BSO, pelvic and periaortic lymphadenectomy.  2. Rotator cuff repair.   CURRENT MEDICATIONS:  Hydrochlorothiazide, baby aspirin, simvastatin,  melatonin, diltiazem, and calcium.   DRUG ALLERGIES:  None.   SOCIAL HISTORY:  The patient is married.  She exercises regularly.  She  is a prior smoker but quit in 2001.   FAMILY HISTORY:  Maternal aunt with postmenopausal breast cancer.   REVIEW OF SYSTEMS:  A 10-point comprehensive review of systems negative  except as noted above.   PHYSICAL EXAM:  Weight 185 pounds, blood pressure 130/62.  GENERAL:  The patient is a healthy  white female in no acute distress.  HEENT is negative.  NECK:  Supple without thyromegaly.  There is no supraclavicular or  inguinal adenopathy.  ABDOMEN:  Soft, nontender.  No mass, organomegaly, ascites or hernias  noted.  PELVIC EXAM:  EGBUS is normal.  The patient does have obvious external  hemorrhoids.  The vagina is atrophic.  No lesions are noted.  Pap smears  were obtained.  Bimanual and rectovaginal exam revealed no masses,  induration or nodularity.  She does have palpable hemorrhoids.   IMPRESSION:  Stage I-b grade 2 endometrial cancer June 2009.  No  evidence of disease.  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  Pap smears are  obtained today.      De Blanch, M.D.  Electronically Signed     DC/MEDQ  D:  11/19/2008  T:  11/19/2008  Job:  [id number]   cc:   Telford Nab, R.N.  [address]  [place], [place] [address]   Shelbie Proctor. Shawnie Pons, M.D.  Arta Silence, MD  Fax: [phone]   Veverly Fells. Excell Seltzer, MD  [address]  [place], [place] [address]

## 2010-10-10 NOTE — Assessment & Plan Note (Signed)
NAME:  KARYN, BRULL NO.:  [id number]   MEDICAL RECORD NO.:  [id number]          PATIENT TYPE:  POB   LOCATION:  [facility] at [facility]         FACILITY:  [facility]   PHYSICIAN:  Tinnie Gens, MD        DATE OF BIRTH:  September 08, 1933   DATE OF SERVICE:  10/22/2007                                  CLINIC NOTE   CHIEF COMPLAINT:  Followup postmenopausal bleeding.   HISTORY OF PRESENT ILLNESS:  The patient is a 77 year old gravida 2,  para 2 who is referred in from [facility] for postmenopausal bleeding,  specifically Dr. Kerby Nora.  She was seen by Dr. Elsie Lincoln on Oct 16, 2007.  She underwent endometrial sampling at that time.  Her  pathology is reviewed today.  She has endometrial adenocarcinoma, FIGO  grade 2.  The patient had pelvic sonogram yesterday that shows normal  uterine size 5.9 x 3.3 x 4.0 cm.  The fundus has thickening at the  endometrial lining at 2.4 cm.  The left and right ovaries appear normal.  There is no free fluid.   PHYSICAL EXAMINATION:  GENERAL:  On exam, her vitals are as noted in  chart.  She is well-developed, well-nourished female in no acute  distress.  ABDOMEN:  Soft, nontender, and nondistended.   IMPRESSION:  Adenocarcinoma of the endometrium.   PLAN:  Referral to De Blanch was made today.  We will send  him all of our records and ultrasound.  If he needs any further x-rays,  we will schedule this prior to meeting with him.  I advised patient of  this diagnosis as well as her primary care physician.  Please let us  know if we can be of any further assistance in the future.           ______________________________  Tinnie Gens, MD     TP/MEDQ  D:  10/22/2007  T:  10/23/2007  Job:  [id number]   cc:   Kerby Nora, MD

## 2010-10-10 NOTE — Assessment & Plan Note (Signed)
[facility]                            CARDIOLOGY OFFICE NOTE   NAME:[name], [name] LECUYER                         MRN:          [id number]  DATE:05/10/2008                            DOB:          1933-07-25    REASON FOR VISIT:  Aortic stenosis.   HISTORY OF PRESENT ILLNESS:  [name] is a 77 year old woman with  moderate aortic stenosis and hypertension.  She has recently undergone  total abdominal hysterectomy.  She tells me that this was done for  treatment of endometrial carcinoma.  She tells me that she had an  extensive surgery, but is beginning to feel better at this point.  Unfortunately, she has had a rotator cuff tear and is going to have an  upcoming surgical repair by Dr. Shelle Iron.   She is having no new cardiac symptoms.  She specifically denies chest  pain, dyspnea, orthopnea, PND, or palpitations.  She complains of  chronic lower extremity edema that has been slightly worse of late.  She  has had no changes in her dietary habits.  She took an increased dose of  hydrochlorothiazide for approximately 1-2 weeks, but did not notice any  major changes.   CURRENT MEDICATIONS:  1. Hydrochlorothiazide 12.5 mg daily.  2. Aspirin 162 mg daily.  3. Diltiazem CD 360 mg daily.  4. Simvastatin 20 mg daily.  5. Calcium 600 mg daily.  6. Coreg 6.25 mg twice daily.   ALLERGIES:  NKDA.   PHYSICAL EXAMINATION:  GENERAL:  The patient is alert and oriented.  She  is in no acute distress.  VITAL SIGNS:  Weight is 192 pounds, blood pressure 170/94, heart rate  64, respiratory rate 12.  HEENT:  Normal.  NECK:  Normal carotid upstrokes with bilateral carotid bruits.  JVP  normal.  LUNGS:  Clear bilaterally.  HEART:  Regular rate and rhythm with a grade 3/6 crescendo-decrescendo  systolic murmur with a preserved A2 component.  There are no diastolic  murmurs or gallops.  ABDOMEN:  Soft, nontender.  No organomegaly.  EXTREMITIES:  There is 1+ pretibial  edema bilaterally.  SKIN:  Warm and dry without rash.   ASSESSMENT:  1. Moderate aortic stenosis.  The patient remains asymptomatic.  Last      echocardiogram dated December 30, 2007, showed left ventricular      ejection fraction 55-65% with a mean transaortic valve gradient of      24 mmHg.  The estimated valve area was 1.06 cm2.  Physical exam is      consistent with moderate atherosclerosis as well.  2. Hypertension.  Blood pressure control remains suboptimal.  Increase      hydrochlorothiazide to 25 mg daily and increase Coreg to 12.5 mg      twice daily.  Continue diltiazem without change.  Home blood      pressure readings by the patient's report are systolics in the 140s-      150s.  3. Surgical clearance.  The patient has no cardiac symptoms.  She      tolerated her recent abdominal hysterectomy well.  She does have      some evidence of volume overload with lower extremity edema, but      really has no cardiac symptoms.  I think she remains at low risk      for her upcoming surgery and is a reasonable candidate.  Her      antihypertensive medication should be continued throughout the      perioperative period without interruption.  4. Lower extremity edema.  We will give her a short course of Lasix 40      mg daily and also to take potassium while she is taking Lasix.  I      have written her for a 30-day supply, but I have asked her to take      it for only 5 days to see if she has some improvement.  Her leg      edema is probably multifactorial in nature.   For followup, I would like to see [name] back in 6 months.     Veverly Fells. Excell Seltzer, MD  Electronically Signed    MDC/MedQ  DD: 05/10/2008  DT: 05/11/2008  Job #: [id number]   cc:   Jene Every, M.D.  Billie D. Bean, FNP  Arta Silence, MD

## 2010-10-10 NOTE — Consult Note (Signed)
NAMESADIYAH, KANGAS NO.:  [id number]   MEDICAL RECORD NO.:  [id number]          PATIENT TYPE:  OUT   LOCATION:  GYN                          FACILITY:  [facility]   PHYSICIAN:  De Blanch, M.D.DATE OF BIRTH:  1933-10-28   DATE OF CONSULTATION:  07/14/2008  DATE OF DISCHARGE:                                 CONSULTATION   CHIEF COMPLAINT:  Endometrial cancer.   INTERVAL HISTORY:  The patient returns today for continuing followup.  She has a stage IB, grade 2 endometrial cancer undergoing initial TAH-  BSO, pelvic and periaortic lymphadenectomy in June 2009.  She was  offered postoperative vaginal vault brachytherapy but she declined this.   Since her last visit, she has done well.  She denies any GI or GU  symptoms, has no pelvic pain, pressure, vaginal bleeding, or discharge,  and her functional status is excellent.   Unfortunately, she recently injured her right shoulder and has had  rotator cuff repair.  She is still in a sling.   PAST MEDICAL HISTORY:   MEDICAL ILLNESSES:  1. Hypertension.  2. Hyperlipidemia.  3. Aortic stenosis.   PAST SURGICAL HISTORY:  1. TAH-BSO and pelvic and periaortic lymphadenectomy.  2. Rotator cuff repair.   CURRENT MEDICATIONS:  1. Hydrochlorothiazide.  2. Baby aspirin.  3. Simvastatin.  4. Calcium.  5. Melatonin.  6. Diltiazem.   DRUG ALLERGIES:  NONE.   SOCIAL HISTORY:  The patient is married.  She exercises regularly.  She  is a prior 1 pack per day smoker but quit in 2001.   FAMILY HISTORY:  Maternal aunt with postmenopausal breast cancer.   REVIEW OF SYSTEMS:  Ten-point comprehensive review of systems negative  except as noted above.   PHYSICAL EXAMINATION:  Weight 192 pounds (up 6 pounds from October  2009).  GENERAL:  The patient is a pleasant, white female in no acute distress.  HEENT:  Negative.  NECK:  Supple without thyromegaly.  There is no supraclavicular or  inguinal adenopathy.  ABDOMEN:  Obese, soft, nontender.  No mass, organomegaly, ascites, or  hernias noted.  PELVIC:  EG/BUS, vagina, bladder, urethra are normal.  The patient has  external hemorrhoids.  Vagina is otherwise normal.  Cervix and uterus  are surgically absent.  No lesions are noted.  Bimanual and rectovaginal  exam reveal no masses, induration, or nodularity.  LOWER EXTREMITIES:  Have 1+ ankle edema.   IMPRESSION:  1. Stage IB, grade 2 endometrial cancer, June 2009.  No evidence of      disease.  Planned Pap smears were obtained.  2. Hemorrhoids.  The patient was given a prescription for Anusol-HC      cream to be applied twice a day as needed.   The patient will see Dr. Tinnie Gens in 3 months and return to see [place] in  6 months.      De Blanch, M.D.  Electronically Signed     DC/MEDQ  D:  07/14/2008  T:  07/14/2008  Job:  [id number]   cc:   Telford Nab, R.N.  [address]  [place], [place] [address]   Arta Silence, MD  Fax: [phone]   Veverly Fells. Excell Seltzer, MD  [address]  [place], [place] [address]   Shelbie Proctor. Shawnie Pons, M.D.

## 2010-10-10 NOTE — Consult Note (Signed)
NAMETRINITEE, HORGAN                  ACCOUNT NO.:  [id number]   MEDICAL RECORD NO.:  [id number]          PATIENT TYPE:  POB   LOCATION:  [facility]                          FACILITY:  [facility]   PHYSICIAN:  John T. Kyla Balzarine, M.D.    DATE OF BIRTH:  Nov 02, 1933   DATE OF CONSULTATION:  DATE OF DISCHARGE:                                 CONSULTATION   CHIEF COMPLAINT:  This patient is seen at the request of Dr. Elsie Lincoln for recommendations regarding management of a grade 2  endometrial adenocarcinoma.   HISTORY OF PRESENT ILLNESS:  This patient underwent menopause at  approximately age 15 and was on no hormonal replacement therapy.  She  relates a several month history of occasional brown discharge and had a  2-3 week history of postmenopausal bleeding when she was seen by Dr.  Penne Lash.  Evaluation included endometrial biopsy which revealed a grade  II endometrial adenocarcinoma with a comment that this may have foci  suspicious for serous subtype.  Ultrasound of the pelvis revealed  essentially normal uterus with possible submucosal fibroid in the  fundus where the endometrium measured 24 mm in width.  The right ovary  was normal and visualized while the left ovary was not seen.  There was  no adnexal mass or free fluid.   PAST MEDICAL HISTORY:  1. Significant for aortic stenosis.  2. Hypertension.  3. Hypercholesterolemia.   CURRENT MEDICATIONS:  1. Hydrochlorothiazide.  2. One-half adult aspirin daily.  3. Diltiazem.  4. Simvastatin.  5. Calcium.  6. Melatonin.   ALLERGIES:  NO CLASSIC ALLERGIES, although she discontinued both DIOVAN  and ALTACE because of severe myalgias.   PERSONAL/SOCIAL HISTORY:  The patient is married and exercises at [employer]  3 days a week.  She is a prior one pack per day smoker, but quit in  November 2001.  She admits 1-2 alcoholic beverages per night.   FAMILY HISTORY:  Maternal aunt had possible postmenopausal breast  cancer, but no other breast,  ovarian or colonic cancers known.   REVIEW OF SYSTEMS:  The patient denies dizziness or passing out.  She  is able to walk without dyspnea and denies chest pain or leg edema.  Otherwise negative in a 10-point system review.   PHYSICAL EXAMINATION:  VITAL SIGNS:  Stable with blood pressure 148/82  and pulse 72.  GENERAL:  The patient is anxious, alert and oriented x3 in no acute  distress.  ENT:  Benign with clear oropharynx, no scleral icterus and full  extraocular range of motions.  The patient has full dental plates.  NECK:  Supple without goiter or JVD.  LUNGS:  Clear to percussion and auscultation.  HEART:  Regular rate and rhythm with a grade 3/6 harsh systolic murmur  best heard along the left sternal border and right sternal border  radiating into the carotids.  No gallop or JVD.  LYMPHATIC SURVEY:  No pathologic lymphadenopathy.  ABDOMEN:  Soft and benign with no ascites, mass, organomegaly.  No  hernias.  BACK:  No spinous or CVA tenderness.  EXTREMITIES:  Full strength and range of motion with no Homan sign,  cords or edema.  PELVIC:  External genitalia and BUS are normal to  inspection and palpation.  Bladder and urethra are normal.  Vaginal  mucosa is atrophic without lesions.  The cervix is without lesions or  discharge.  Bimanual and rectovaginal examinations reveal no cervical  motion tenderness, upper limit size uterus and no adnexal pathology or  cul-de-sac nodularity.  No pelvic tenderness.   ASSESSMENT:  1. At least grade 2 endometrial adenocarcinoma with a concern for      possible serous component.  2. Aortic stenosis.  3. Hypertension.   RECOMMENDATIONS:  This patient requires total hysterectomy with BSO.  Because there is a question of a serous component, I would recommend  pelvic and aortic lymphadenectomies.  Surgery is tentatively scheduled  to be performed by Dr. Loree Fee in 1 week.  The patient is aware  that he will be doing this open  procedure.  She will have an opportunity  to meet with Dr. Loree Fee on the day of surgery.  The procedure was  described in detail and multiple questions answered today.  The patient  was advised to stop her aspirin until after surgery.   Dr. Earmon Phoenix office was contacted and we will attempt to get cardiology  clearance prior to surgery.  Of note, she had a stress test performed  approximately 2 years ago which was normal per her report.  She remains  asymptomatic from her aortic stenosis.  Echocardiogram 1 year ago showed  left ventricular ejection fraction of 65-70%.  Based on her performance  status, it is likely that she could tolerate surgery.      John T. Kyla Balzarine, M.D.  Electronically Signed     JTS/MEDQ  D:  10/28/2007  T:  10/28/2007  Job:  [id number]   cc:   Veverly Fells. Excell Seltzer, MD  [address]  [place], [place] [address]   Arta Silence, MD  Fax: [phone]   Lesly Dukes, M.D.   Telford Nab, R.N.  [address]  [place], [place] [address]

## 2010-10-10 NOTE — Op Note (Signed)
[name], [name] NO.:  [id number]   MEDICAL RECORD NO.:  [id number]          PATIENT TYPE:  INP   LOCATION:  0010                         FACILITY:  [facility]   PHYSICIAN:  De Blanch, M.D.DATE OF BIRTH:  May 01, 1934   DATE OF PROCEDURE:  11/04/2007  DATE OF DISCHARGE:                               OPERATIVE REPORT   PREOPERATIVE DIAGNOSIS:  Grade 2 endometrial adenocarcinoma.   POSTOPERATIVE DIAGNOSES:  1. Grade 2 endometrial adenocarcinoma.  2. Extensive colonic diverticulosis.   PROCEDURE:  1. Total abdominal hysterectomy.  2. Bilateral salpingo-oophorectomy.  3. Pelvic and paraaortic lymphadenectomy.   SURGEON:  Daniel L. Clarke-Pearson, MD   ASSISTANT:  Telford Nab, RN   ANESTHESIA:  General with oral tracheal tube.   ESTIMATED BLOOD LOSS:  150 mL.   SURGICAL FINDINGS:  At the time of exploratory laparotomy, the patient's  uterus, tubes, and ovaries appeared normal.  On frozen section, she was  found to only have a small amount of cancer with no apparent evidence of  invasion.  The patient had extensive diverticulosis throughout the  entire colon.  The appendix was normal.  All lymph nodes appeared  normal.  The upper abdomen including the liver, diaphragm, stomach,  spleen, and omentum were normal.   PROCEDURE:  The patient was brought to the operating room and after  satisfactory attainment of general anesthesia, was placed in the  modified lithotomy position in [place] stirrups.  The anterior abdominal  wall, perineum, and vagina were prepped with Betadine; a Foley catheter  was inserted, and the patient was draped.  The abdomen was entered  through a midline incision.  Peritoneal washings were obtained from the  pelvis.  The upper abdomen and pelvis were explored with the above-noted  findings.  The Bookwalter retractor was assembled, and small bowel was  packed out of the pelvis.  The uterus was grasped with long Kellys.  The  round ligaments were divided, and the retroperitoneal space is opened,  identifying the external iliac artery and vein, internal iliac artery  and ureter.  The ovarian vessels were skeletonized, clamped, cut, free-  tied, and suture ligated.  Bladder flap was advanced through sharp and  blunt dissection.  The uterine vessels were skeletonized then clamped,  cut, and suture ligated.  In a stepwise fashion, the paracervical and  cardinal ligaments were clamped, cut, and suture ligated.  Vaginal angle  and uterosacral ligament were cross-clamped, divided, and the vagina  transected from its connection to the cervix.  The vaginal angles were  transfixed with 0 Vicryl and the central portion of the vagina closed  with interrupted figure-of-eight sutures of 0 Vicryl.   Attention was turned to performing bilateral pelvic lymphadenectomy.  The lymph nodes overlying the external iliac artery and vein, internal  iliac artery, and obturator fossa were removed.  Hemostasis was achieved  with cautery and hemoclips.  Care was taken to avoid injury to the  genitofemoral nerve and to the obturator nerve.   The retractors were repositioned, and the right common iliac artery and  aorta were exposed.  Peritoneal incision was made overlying the right  common iliac artery and along aorta to several centimeters above the  inferior mesenteric artery.  The ureter on the right side was identified  and mobilized laterally, thus exposing the vena cava and aorta.  Lymph  nodes were removed, starting at the right common iliac artery along the  vena cava and aorta to several centimeters above the inferior mesenteric  artery.  In the course of the dissection, the right ovarian artery was  doubly clipped and divided.   The dissection region was found to be hemostatic.  Pelvis was reexplored  and found to be hemostatic.  Pelvis and abdomen were irrigated with  saline.   Packs and retractors were removed, and the  anterior abdominal wall was  closed in layers, the first being a running mass closure using #1 PDS.  Subcutaneous tissue was irrigated, hemostasis achieved with cautery, and  the skin was closed with skin staples.  A dressing was applied.  The  patient was awakened from anesthesia, taken to the recovery room in  satisfactory condition.  Sponge, needle, and instrument counts correct x  2.      De Blanch, M.D.  Electronically Signed     DC/MEDQ  D:  11/04/2007  T:  11/04/2007  Job:  [id number]   cc:   Shelbie Proctor. Shawnie Pons, M.D.   Veverly Fells. Excell Seltzer, MD  [address]  [place], [place] [address]   Arta Silence, MD  Fax: [phone]   Telford Nab, R.N.  [phone] [address]  [place], [place] [address]

## 2010-10-10 NOTE — Assessment & Plan Note (Signed)
[facility]                            CARDIOLOGY OFFICE NOTE   NAME:[name] [name] MCROBERTS                         MRN:          [id number]  DATE:09/23/2007                            DOB:          November 26, 1933    [name] [name] was seen in follow-up at the [facility] cardiology office on  September 23, 2007.  Ms. [name] is a delightful 77 year old woman with  moderate aortic stenosis. She also has hypertension that has been  moderately difficult to control.   From a symptomatic standpoint,  Ms. [name] is doing quite well.  She  continues regular exercise at [employer] 3-4 days weekly for 45 minutes with  no exertional symptoms.  She denies chest pain, dyspnea, lightheadedness  or syncope.  She has no palpitations, edema or other complaints. She had  been started on Altace but was intolerant due to myalgias. When I saw  her in September, I gave her a trial of Diovan but she had the same  problem.  She has been off of Diovan now for several months.  She  continues on hydrochlorothiazide and diltiazem. She notes home systolic  blood pressure readings in the 140s and 150s with diastolic readings in  the 70s and 80s.   MEDICATIONS:  1. HCTZ 12.5 mg daily.  2. Aspirin 162 mg daily.  3. Diltiazem 360 mg daily.  4. Simvastatin 20 mg daily.  5. Calcium 600 mg plus D.  6. Melatonin 1 mg daily.   ALLERGIES:  NKDA.   PHYSICAL EXAMINATION:  The patient is alert and oriented.  She is in no  acute distress.  Weight is 185, blood pressure 184/96, heart rate 69, respiratory rate  16.  HEENT:  Normal.  NECK:  Normal carotid upstrokes with transmitted bruits.  JVP normal.  LUNGS:  Clear bilaterally.  HEART:  The apex is discrete and nondisplaced. The heart is a regular  rate and rhythm with a 3/6 harsh ejection murmur along the left sternal  border and right upper sternal border.  The A2 component of the second  heart sound is preserved.  There are no diastolic murmurs or gallops.  ABDOMEN:  Soft, nontender, no organomegaly.  EXTREMITIES:  No clubbing, cyanosis or edema. Peripheral pulses 2+ and  equal throughout.   EKG shows sinus rhythm with moderate with LVH, no significant ST or T-  wave changes.   ASSESSMENT:  1. Moderate aortic stenosis. Exam is stable.  The patient remains      asymptomatic.  Follow-up echo in June.  Her last study showed      preserved LVEF of 65-70% with a mean aortic valve gradient of 34      mmHg and a valve area in the range of 0.8-0.9.  2. Hypertension.  Blood pressure control is suboptimal.  She is      clearly intolerant to ACE inhibitors and ARBs. I am going to give      her a trial of Coreg 6.25 mg twice daily to take in addition to her      HCTZ and diltiazem.  3. Dyslipidemia.  Lipids from April 2008 showed a cholesterol of 150,      triglycerides 80, HDL 65, LDL 69. She is at goal on low dose Zocor.      Follow-up lipids and LFTs today.   For follow-up, Ms. [name] will have follow-up duplex ultrasounds of her  carotids and aorta and abdominal aorta in June when she comes in for her  echocardiogram.  She has had mild carotid stenosis in the past as well  as celiac stenosis.   I would like to see her back in clinic in 6 months.     Veverly Fells. Excell Seltzer, MD  Electronically Signed    MDC/MedQ  DD: 09/23/2007  DT: 09/23/2007  Job #: [id number]   cc:   Arta Silence, MD  Atha Starks. Bean, FNP

## 2010-10-10 NOTE — Assessment & Plan Note (Signed)
NAME:  [name], [name] NO.:  [id number]   MEDICAL RECORD NO.:  [id number]          PATIENT TYPE:  POB   LOCATION:  [facility]         FACILITY:  [facility]   PHYSICIAN:  Tinnie Gens, MD        DATE OF BIRTH:  05-Sep-1933   DATE OF SERVICE:  03/08/2009                                  CLINIC NOTE   The patient comes to the office today for a repeat vaginal Pap smear.  The patient has a history of adenocarcinoma of the uterus.  She had a  complete hysterectomy in June 2009.  She underwent pelvic and periaortic  lymphadenectomy.  Please see this past records.  The patient is on a 4  month repeat Pap smear scheduled.  She alternates between this office  and Dr. Nelwyn Salisbury office.  She will see Dr. Stanford Breed in  February.  The patient denies any problems at this time other than some  swelling in her legs secondary to Coreg.  She will be returning to see  her cardiologist concerning this.   ALLERGIES:  No known allergies.   PHYSICAL EXAMINATION:  GENERAL:  Well-developed, well-nourished 77-year-  old Caucasian female, in no acute distress.  VITAL SIGNS:  Blood pressure is 121/68, pulse is 61, weight is 185,  height is 5 feet 6 inches.  GENITALIA:  Externally, no lesions or discharges.  There are some rectal  hemorrhoids noted.  VAGINAL:  The patient does have a bit of rectal prolapse internally.  There is no cervix.  There are no lesions present.  BIMANUAL:  There is no mass appreciated.  EXTREMITIES:  There is +1 to +2 peripheral edema.   ASSESSMENT:  Status post adenocarcinoma of the uterus.   PLAN:  We will repeat her Pap smear today.  She will return to see Dr.  Stanford Breed in February and then return to this office in June 2011.  She should have any problems between now and then, she is asked to  contact the office.      Remonia Richter, NP    ______________________________  Tinnie Gens, MD    LR/MEDQ  D:  03/08/2009  T:   03/09/2009  Job:  [id number]

## 2010-10-10 NOTE — Assessment & Plan Note (Signed)
NAME:  [name], [name] NO.:  [id number]   MEDICAL RECORD NO.:  [id number]          PATIENT TYPE:  POB   LOCATION:  [facility]         FACILITY:  [facility]   PHYSICIAN:  Argentina Donovan, MD        DATE OF BIRTH:  1934-01-18   DATE OF SERVICE:  09/28/2008                                  CLINIC NOTE   The patient is a 77 year old Caucasian female who underwent total  abdominal hysterectomy, bilateral salpingo-oophorectomy, and pelvic and  para-aortic lymphadenectomy in November 01, 2007.  She also underwent pelvic  and para-aortic lymphadenectomy.  Pathological review of the patient was  found to have endometrioid adenocarcinoma FIGO grade 2.  Tumor extended  to a depth of 0.5 cm into the myometrium and overall thickness of 1.2 cm  at that level.  No limp or vascular space invasion was found.  16 lymph  nodes, none showed metastatic disease.  The patient then had a followup  in February 2010 where she had a followup Pap smear that was normal as  the most common recurrence would be at the vaginal cuff.  This was done  in Dr. Huntley Dec office.  She is in today for her annual which  they have scheduled her for and breast and pelvic repeat Pap, and she is  also scheduled for a followup visit for Dr. Loree Fee in August  2010.   On examination, she has breasts symmetrical with no dominant masses, no  nipple discharge.  No supraclavicular or axillary nodes.  Her abdomen is  soft, flat, and nontender.  No masses or organomegaly with a vertical  subumbilical surgical scar that is well healed.  External genitalia is  normal.  BUS somewhat atrophic but otherwise normal.  Vagina, there is  loss of rugations and the vaginal cuff is normal.  Pap smear was retaken  and bimanual examination failed to reveal any pelvic masses.   IMPRESSION:  Normal followup gynecological examination status post total  abdominal hysterectomy.  The patient to follow up with Dr. Loree Fee  in August 2010, and the patient is already scheduled for a repeat  mammogram.           ______________________________  Argentina Donovan, MD     PR/MEDQ  D:  09/28/2008  T:  09/29/2008  Job:  [id number]

## 2010-10-10 NOTE — Consult Note (Signed)
NAME:  [name], [name] NO.:  [id number]   MEDICAL RECORD NO.:  [id number]          PATIENT TYPE:  OUT   LOCATION:  GYN                          FACILITY:  [facility]   PHYSICIAN:  De Blanch, M.D.DATE OF BIRTH:  1933/06/14   DATE OF CONSULTATION:  DATE OF DISCHARGE:                                 CONSULTATION   CHIEF COMPLAINT:  Endometrial cancer, postoperative followup.   INTERVAL HISTORY:  The patient underwent TAH/BSO pelvic and periaortic  lymphadenectomy November 04, 2007.  Final pathology showed a grade II  endometrial carcinoma with superficial myometrial invasion (0.5 cm of  1.2 cm of thickness).  The patient has had an uncomplicated  postoperative course.  She reports no GI/GU symptoms.  No pelvic pain,  pressure, vaginal bleeding or discharge.  No fevers.   PHYSICAL EXAMINATION:  VITAL SIGNS:  Weight 180 pounds.  GENERAL:  This is a pleasant white female in no acute distress.  HEENT:  Negative.  NECK:  Supple without thyromegaly.  There is no supraclavicular or  inguinal adenopathy.  ABDOMEN:  Soft, nontender.  No mass, organomegaly, ascites or hernia is  noted.  Midline incision is healing nicely.  PELVIC:  EBUS, vagina, bladder and urethral are normal.  Cervix and  uterus are surgically absent.  Vaginal cuff is healing well.  Bimanual  rectovaginal exam reveal minimal postoperative induration.  LOWER EXTREMITIES:  Bilateral 2+ ankle edema and moderate varicosities.   IMPRESSION:  Stage IB grade 2 endometrial carcinoma with good  postoperative healing.  She is given the okay to return to full levels  of activity.   PLAN:  1. The patient is scheduled to see Dr. Roselind Messier on December 09, 2007 to      discuss other pros and cons of vaginal vault      brachytherapy.  I indicated to the patient that I though that this      was a reasonable approach, although certainly not mandatory and      that she should understand the risks and benefits of each  before      making a final decision.  2. She will return to see me in 3 months for postoperative      surveillance.      De Blanch, M.D.  Electronically Signed     DC/MEDQ  D:  12/05/2007  T:  12/05/2007  Job:  [id number]   cc:   Telford Nab, R.N.  [address]  [place], [place] [address]   Veverly Fells. Excell Seltzer, MD  [address]  [place], [place] [address]   Arta Silence, MD  Fax: [phone]   Lesly Dukes, M.D.   Billie Lade, M.D.  Fax: [phone]

## 2010-10-10 NOTE — Discharge Summary (Signed)
NAMEAPRILLE, SAWHNEY NO.:  [id number]   MEDICAL RECORD NO.:  [id number]          PATIENT TYPE:  INP   LOCATION:  1524                         FACILITY:  [facility]   PHYSICIAN:  Tanya S. Shawnie Pons, M.D.   DATE OF BIRTH:  May 31, 1933   DATE OF ADMISSION:  11/04/2007  DATE OF DISCHARGE:  11/07/2007                               DISCHARGE SUMMARY   FINAL DIAGNOSES:  1. Endometrial cancer.  2. Hypertension.  3. Elevated cholesterol.  4. Aortic stenosis.   PROCEDURE:  TAH-BSO, and pelvic paraaortic lymphadenectomy.   PERTINENT LABORATORY DATA:  X-ray showed a negative chest x-ray, showed  a normal EKG.  Pertinent labs include a postoperative normal electrolyte  and postoperative hemoglobin of 14.1.   REASON FOR ADMISSION:  The patient is a 77 year old patient with  postmenopausal bleeding at this time having endometrial carcinoma by  endometrial biopsy, is admitted for definitive treatment.   HOSPITAL COURSE:  The patient was admitted.  She underwent the above  procedure on the day of admission.  Postoperatively, she was transferred  to the floor where she did well.  Her Foley was discontinued on  postoperative day #1.  She began ambulating.  She was tolerating a  regular diet.  She remained afebrile for 3 days, had a BM and was stable  for discharge on postoperative day #3.   DISCHARGE DISPOSITION AND CONDITION:  The patient was discharged home in  good condition.   FOLLOWUP:  Follow up will be in a week for staple removal, in 2 weeks  for postop check.   DISCHARGE MEDICATIONS:  1. Percocet 5/325 1 tab q.4-6 h. p.r.n. pain #48 given.  2. Diltiazem ER 180 two capsules daily.  3. Carvedilol 6.25 mg 1 twice daily.  4. Simvastatin 20 mg 1 daily.  5. Hydrochlorothiazide 25 mg 1/2 tab daily.  6. Aspirin 325 mg half tablet daily.  7. Calcium phosphate 600 mg 1 p.o. daily.  8. Metamucil 1 dose as needed.     Shelbie Proctor. Shawnie Pons, M.D.  Electronically Signed    TSP/MEDQ  D:  11/07/2007  T:  11/08/2007  Job:  [id number]

## 2010-10-10 NOTE — Assessment & Plan Note (Signed)
[facility]                            CARDIOLOGY OFFICE NOTE   NAME:[name], [name] ABELSON                         MRN:          [id number]  DATE:02/24/2007                            DOB:          06-10-1933    [name] was seen in followup at the [facility] office on  February 24, 2007.  [name] is a delightful 77 year old woman with  moderate aortic stenosis.  She has been followed by Dr. Glennon Hamilton in  the past and I will be assuming her care from here forward.   [name] is doing well from a symptomatic standpoint.  She goes to  [employer] and exercises four days weekly and has no exertional symptoms.  She specifically denies chest pain, lightheadedness, dyspnea or syncope.  She has no orthopnea, PND or edema.  She has not had palpitations or  other complaints.  Back in June she was started on Altace but was  intolerant due to myalgias.  She reports that her systolic blood  pressure at home is typically greater than 140 and her diastolic  pressure runs in the 70s.  She has no other complaints at this time.   CURRENT MEDICATIONS:  1. Hydrochlorothiazide 12.5 mg daily.  2. Metamucil daily.  3. Aspirin 162 mg daily.  4. Diltiazem 360 mg daily.  5. Simvastatin 20 mg daily.  6. Benadryl 50 mg daily.  7. Calcium 600 mg daily.   ALLERGIES:  No known drug allergies.   PHYSICAL EXAMINATION:  The patient is alert and oriented, no acute  distress.  Weight is 178, blood pressure 139/80, heart rate 78,  respiratory rate 12.  HEENT:  Normal.  NECK:  Normal carotid upstrokes with bilateral bruits. JVP is normal.  LUNGS:  Clear to auscultation bilaterally.  HEART:  Regular rate and rhythm with a 3/6 harsh aortic ejection murmur  along the left lower sternal border.  The A2 component of the second  heart sound is preserved.  There are no diastolic murmurs or gallops.  ABDOMEN:  Soft, nontender.  No organomegaly.  EXTREMITIES:  No cyanosis, clubbing  or edema.  Peripheral pulses are 2+  and equal throughout.   EKG shows normal sinus rhythm with nonspecific ST abnormality and is  otherwise within normal limits.   ASSESSMENT:  1. Moderately-severe aortic stenosis.  Echocardiogram from October 28, 2006, showed normal left ventricular systolic function with an      ejection fraction of 65-70%.  There is mild increase in left      ventricular wall thickness.  The aortic valve thickness was      moderately increased with a mean transaortic valve gradient of 34      mmHg.  The estimated valve area was 0.9 sqcm.  [name] has both      physical exam and noninvasive studies consistent with moderate AS.      I think she can be followed clinically in the setting of her      asymptomatic status.  We will repeat an echocardiogram in June 2009  for annual followup.  I would like to see her back in 6 months.  2. Dyslipidemia.  She has responded well to low-dose simvastatin.  Her      lipids from April show a total cholesterol of 150 with      triglycerides of 80 and HDL of 65 and an LDL of 69.  Will continue      with followup lipids and LFTs in April 2009.  3. Hypertension.  Her home blood pressure control seems suboptimal.      She was intolerant to Altace.  I am going to try her on low-dose      Diovan.  I wrote her a prescription for 80 mg daily and asked her      to take 40 mg for the first week and then to increase to 80 mg for      ongoing antihypertensive treatment.  4. Peripheral arterial disease.  She has had mild carotid stenosis and      mild abdominal aortic arthrosclerosis back in 2006 with significant      celiac stenosis.  She does not have any signs of intestinal angina.      Will follow up with abdominal and carotid ultrasound studies when      she has her echocardiogram done in June.   For followup, I am going to see [name] back in 6 months, or sooner if  any new problems arise.     [name] Fells. Excell Seltzer, MD   Electronically Signed    MDC/MedQ  DD: 02/24/2007  DT: 02/24/2007  Job #: [id number]   cc:   Arta Silence, MD  Atha Starks. Bean, FNP

## 2010-10-10 NOTE — Assessment & Plan Note (Signed)
NAME:  [name], [name] NO.:  [id number]   MEDICAL RECORD NO.:  [id number]          PATIENT TYPE:  POB   LOCATION:  [facility]         FACILITY:  [facility]   PHYSICIAN:  Elsie Lincoln, MD      DATE OF BIRTH:  09/02/1933   DATE OF SERVICE:                                  CLINIC NOTE   HISTORY:  The patient is a 77 year old G2 P2 female whose menstrual  period last occurred in her 47s.  She is complaining of postmenopausal  bleeding.  Several months ago, she had one drop on her underwear for  several days.  However last week up until Saturday, she had daily  bleeding that required a pad.  She has had no bleeding since Saturday.  She has had no abdominal pain, no dysuria or hematuria, trouble with  bowel movements.  She does have a history of a hemorrhoid, but this is  not where the blood is coming from she said.   PAST MEDICAL HISTORY:  1. Heart murmur.  2. High cholesterol.  3. High blood pressure.   PAST SURGICAL HISTORY:  None.   GYNECOLOGIC HISTORY:  Never had a history of abnormal Pap smear.  Last  Pap smear was 2006.  The patient gets yearly mammograms and they have  all been normal.  She has had a colonoscopy less than 2 years ago and it  was normal.   FAMILY HISTORY:  Father and brother have diabetes.  Parents have heart  disease.  Mother had a heart attack.  Both parents have high blood  pressure.   SOCIAL HISTORY:  The patient drinks 2 drinks per day of alcohol.  The  patient stopped smoking in 2001 and lives with her spouse.  She is  obviously retired at this age.   REVIEW OF SYSTEMS:  Positive for weight gain and vaginal bleeding.  We  are addressing the vaginal bleeding today.   PHYSICAL EXAMINATION:  VITAL SIGNS:  Pulse 65, blood pressure 155/78,  weight 186, height 5 feet 6 inches.  GENERAL:  Well-nourished, well-developed in no apparent distress.  RESPIRATORY:  Good inspiratory effort.  ABDOMEN:  Soft, nontender.  No organomegaly.   No hernia.  GENITALIA:  Tanner 5.  Vagina atrophic.  Cervix easily found.  Uterus  sounds to  1. 5 cm.  Uterus nontender.  Adnexa no masses, nontender.  Positive      hemorrhoid.  Urethra no obvious prolapse.  EXTREMITIES:  Nontender.   ASSESSMENT/PLAN:  A 77 year old female for postmenopausal bleeding.  1. Endometrial biopsy today as a separate procedure.  2. Two passes were made and the  uterus sounded to 8.5 to 9 cm.  3. Transvaginal ultrasound.  4. Return to clinic in 2 weeks for results.           ______________________________  Elsie Lincoln, MD     KL/MEDQ  D:  10/16/2007  T:  10/16/2007  Job:  [id number]

## 2010-10-10 NOTE — Assessment & Plan Note (Signed)
[facility]                            CARDIOLOGY OFFICE NOTE   NAME:[name], [name] MOGG                         MRN:          [id number]  DATE:11/03/2007                            DOB:          [birth year]    [name] was seen for preoperative cardiac evaluation at the [facility]  Cardiology office on November 03, 2007.  [name] is a 77 year old woman  with moderately severe aortic stenosis and hypertension.  She was just  seen here on September 23, 2007,  for routine follow-up.  She remains active  with regular exercise at [employer] approximately 3-4 days weekly.  At that  level activity, she has no chest pain, dyspnea, lightheadedness or  syncope.  She has had no palpitations, edema, orthopnea, PND or other  cardiovascular complaints.   Her main issue of late has been that of blood pressure control.  She has  been intolerant to ACE inhibitors and ARBs because of myalgias.  When I  saw her in April, her blood pressure was markedly elevated at 184/96.  This was on a combination of diltiazem and hydrochlorothiazide.  I added  Coreg 6.25 mg twice daily at that time and she has had better blood  pressure control since then.   CURRENT MEDICATIONS:  1. Hydrochlorothiazide 12.5 mg daily.  2. Aspirin 162 mg daily which is currently on hold.  3. Diltiazem 360 mg daily.  4. Simvastatin 20 mg daily.  5. Calcium 600 mg daily.  6. Coreg 6.25 mg twice daily.   ALLERGIES:  No known drug allergies.   PHYSICAL EXAMINATION:  GENERAL APPEARANCE:  On exam, the patient is  alert and oriented.  No acute distress.  VITAL SIGNS:  Weight 182, blood pressure 142/84, heart rate 66,  respiratory rate 16.  HEENT:  Normal.  NECK:  Normal carotid upstrokes with left greater than right carotid  bruits.  Jugular venous pressure is normal.  LUNGS:  Clear bilaterally.  HEART:  Regular rate and rhythm with a 3/6 harsh ejection murmur heard  best at the right upper sternal border.  The A2  component of the second  heart sound is preserved.  There are no diastolic murmurs or gallops.  ABDOMEN:  Soft, nontender no organomegaly.  EXTREMITIES:  No clubbing, cyanosis or edema.  Peripheral pulses 2+ and  equal throughout.   EKG shows normal sinus rhythm with left ventricular hypertrophy.  There  are no significant ST-segment or T-wave changes.   ASSESSMENT:  [name] is to undergo total hysterectomy for treatment  of endometrial cancer.  She is at low risk from a cardiac standpoint.  She has asymptomatic aortic stenosis.  Her exam remains consistent with  moderate aortic stenosis, which is consistent with her last  echocardiogram performed one year ago that showed a valve area of 0.9  cm2 and a mean transaortic valve gradient of 34 mmHg.  Recommend  continuation of her current medical therapy to keep her blood pressure  under control in the perioperative period.  If she develops worsening  hypertension, her Coreg dose could be doubled to 12.5  mg twice daily.  For now, I am going to leave her medications the same.  She underwent a  stress Myoview study in 2007 that was negative for ischemia.  She has no  new symptoms and I do not think further ischemic evaluation is  necessary.     If she develops any cardiac problems in the perioperative period,  please feel free to contact me at any time.   [name] was scheduled for routine follow-up in six months.  She is due  for an echocardiogram, carotid ultrasound and follow-up abdominal aortic  ultrasound.  We have postponed this for two months to give her a chance  to recover from her surgery.  I would like to see her back in clinical  follow-up in six months.     Veverly Fells. Excell Seltzer, MD  Electronically Signed    MDC/MedQ  DD: 11/03/2007  DT: 11/03/2007  Job #: [id number]   cc:   De Blanch, M.D.  Lesly Dukes, M.D.

## 2010-10-10 NOTE — Assessment & Plan Note (Signed)
NAME:  [name], [name] NO.:  [id number]   MEDICAL RECORD NO.:  [id number]          PATIENT TYPE:  POB   LOCATION:  [facility]         FACILITY:  [facility]   PHYSICIAN:  Tinnie Gens, MD        DATE OF BIRTH:  [birth year]   DATE OF SERVICE:  10/26/2009                                  CLINIC NOTE   CHIEF COMPLAINT:  Physical exam.   HISTORY OF PRESENT ILLNESS:  The patient is a 77 year old gravida 2,  para 2, who has previously undergone TAH-BSO, pelvic and periaortic  lymphadenectomy for adenocarcinoma of the endometrium.  The patient  declined postoperative radiation.  She has q.4 month Pap smears, the  last one in February with Dr. Serita Kyle.  She returns today for  Pap smear.  Her interval history is remarkable for primary breast  cancer, diagnosis based on calcifications on mammography.  She has  undergone biopsy and she is scheduled to follow up with a breast surgeon  next week.  The patient has a lot of bruising from her previously  mentioned biopsy.  Additionally, the patient had been on calcium, but  this is causing constipation, since she has stopped this, and her last  bone scan apparently was fairly normal.  Additionally, the patient has a  known valvular defect of the heart and her cardiologist is recommending  a probable valve replacement in the near future.  The patient also  reports increasing pedal edema.  She is going to see her cardiologist  about possibly changing her medications around to help with this.   PAST MEDICAL HISTORY:  Significant for hypertension, elevated  cholesterol, valvular heart disease, endometrial cancer, and now breast  cancer.   PAST SURGICAL HISTORY:  TAHBSO, pelvic and periaortic lymphadenectomy,  shoulder surgery, and now breast biopsy.   ALLERGIES:  None known.   MEDICATIONS:  1. She is on simvastatin 1 p.o. daily.  2. Aspirin 81 mg p.o. daily.  3. Carvedilol 1 p.o. b.i.d.  4. Diltiazem 360 mg 1  p.o. daily.  5. Metamucil 1 dose daily.  6. Furosemide 40 mg p.o. daily.  7. Potassium 1 p.o. b.i.d.  8. Vitamin D 1000 units p.o. b.i.d.   OBSTETRICAL HISTORY:  G2, P2, with 2 vaginal deliveries.   GYN HISTORY:  History of endometrial cancer, no history of abnormal Pap  smears.  Additionally, the patient has undergone colonoscopy several  years ago and she has had regular mammograms.   SOCIAL HISTORY:  She is married.  She stopped smoking in 2001.  She  drinks approximately 2 alcoholic beverages per week.   REVIEW OF SYSTEMS:  A 14-point review of systems reviewed and is  negative except as outlined in the HPI.   PHYSICAL EXAMINATION:  VITAL SIGNS:  Today, vitals are as noted in the  chart.  Blood pressure 138/74.  GENERAL:  She is a well-developed, well-nourished female in no acute  distress.  She looks stated age.  HEENT:  Normocephalic, atraumatic.  Sclerae anicteric.  LUNGS:  Clear bilaterally.  CV:  Harsh 3/6 systolic ejection murmur heard throughout the  pericardium.  ABDOMEN:  Soft, nontender, nondistended.  Well-healed midline scar.  BREASTS:  Symmetric with flattened nipples.  The patient's right breast  reveals moderate hematoma in various stages of healing and Band-Aid is  present.  There is approximately 4 x 5 cm firm mass felt there.  EXTREMITIES:  Have 2+ edema bilaterally.  GU:  Normal external female genitalia.  BUS is pale with loss of  rugation.  There is a rectocele noted.  The vaginal cuff shows no gross  lesions.  There is no mass of the cuff either noted.   IMPRESSION:  1. History of endometrial cancer.  2. New diagnosis of breast cancer.  3. Valvular heart disease.  4. Hypertension.  5. Elevated cholesterol.   PLAN:  Follow up with breast surgeon as needed.  Pap smear today and in  4 months with Dr. Serita Kyle.  She will turn here in 8 months  for repeat Pap smear.  Follow up with Cardiology in terms of edema and  valve replacement  need.           ______________________________  Tinnie Gens, MD     TP/MEDQ  D:  10/26/2009  T:  10/26/2009  Job:  [id number]

## 2010-10-13 NOTE — Assessment & Plan Note (Signed)
[facility]                            CARDIOLOGY OFFICE NOTE   NAME:[name], [name] ELLISTON                         MRN:          [id number]  DATE:09/10/2006                            DOB:          29-Jan-1934    ADDENDUM:  Because of the persistent hypertension, I suggest adding  Altace 10 mg daily.     Cecil Cranker, MD, [facility]     EJL/MedQ  DD: 09/10/2006  DT: 09/10/2006  Job #: [id number]

## 2010-10-13 NOTE — Assessment & Plan Note (Signed)
[facility]                              CARDIOLOGY OFFICE NOTE   NAME:[name], [name] OFFNER                         MRN:          [id number]  DATE:02/25/2006                            DOB:          1933-09-19    HISTORY OF PRESENT ILLNESS:  The patient is a 77 year old active white  female with moderate aortic stenosis and some recent atypical left arm pain.  She also has hypertension, hyperlipidemia. Medications include simvastatin  20, hydrochlorothiazide 12.5, Dilacor 240, aspirin which we are reducing to  162. The LDL recently was 93.   PROCEDURE:  Treadmill.   DESCRIPTION OF PROCEDURE:  She exercised through 1 minute of stage 3 of  modified Bruce protocol. Heart rate was up to 148, which was maximum  predicted. She had no chest discomfort and only minimal fatigue. She had no  left arm discomfort. With initial exercise, she did have a coupled PDC and  otherwise, had no arrhythmia. Her ST segments, which were slightly depressed  at rest, became further depressed with up slopping at maximum exercise,  approximately 1.5 to 3 mm.   IMPRESSION:  Positive treadmill electrocardiogram with no symptoms at 1  minute stage 3 modified Bruce protocol.   PLAN:  I plan to review this with Dr. Riley Kill but would anticipate Myoview  will be indicated, although change is probably related to the aortic  stenosis.   I am also concerned about her blood pressure, which was elevated at 160/92  at rest and went up to 199/68. I have suggest that she monitor this closely  at home and followup with Dr. Hetty Ely. I will plan to see her back in 2  months or p.r.n. It should be noted that the patient has had no recurrence  of her left arm discomfort.            ______________________________  E. Graceann Congress, MD, [facility]     EJL/MedQ  DD:  02/25/2006  DT:  02/25/2006  Job #:  [id number]   cc:   Arta Silence, MD

## 2010-10-13 NOTE — Assessment & Plan Note (Signed)
[facility]                            CARDIOLOGY OFFICE NOTE   NAME:[name], [name]                         MRN:          [id number]  DATE:04/30/2006                            DOB:          Oct 07, 1933    A patient of Dr. Lorenza Chick.   The patient is a very pleasant, 77 year old, active white female with  moderate aortic stenosis.  Most recent echocardiogram November 22, 2005,  revealing a peak aortic velocity of 358 with a mean gradient of 30 and a  peak gradient of 51.  Normal EF of 70%.   The patient is asymptomatic.  She recently had a stress test revealing  no ischemia or scar.  She has had no further left arm pain.  Her blood  pressures have been fairly well controlled, however.  Today, her blood  pressure is 160/84.   Her medications include:  1. Dilacor 240.  2. Hydrochlorothiazide 12.5.  3. Simvastatin 20.  4. Aspirin 162.   PHYSICAL EXAM:  Blood pressure 160/84, pulse 75, normal sinus rhythm.  GENERAL APPEARANCE:  Normal.  JVP is not elevated.  Carotid pulse is palpable and equal without  bruits.  LUNGS:  Clear.  CARDIAC:  2/6 moderately systolic ejection murmur radiating to the neck.  ABDOMEN:  Unremarkable.  EXTREMITIES:  Normal pulses palpable bilaterally.   IMPRESSION:  1. Moderate aortic stenosis.  2. Hyperlipidemia on therapy.  3. Hypertension.  4. The carotid bruit is probably transmitted.   DISPOSITION:  We plan to increase the Diltiazem to 360, leave her on the  Simvastatin, and we will check her BMP and lipid in the near future.  She should have carotid Dopplers if these have not been performed.  I  will plan to see her back in 4 months with a repeat 2D echo.  I have  suggested she see Dr. Hetty Ely in the near future to followup with her  blood pressure.   ADDENDUM:  The patient did have carotid Dopplers in December 2006,  revealing 0-39% bilateral stenosis, mild right vertebral ostial  stenosis.   This is to be  repeated in a year.     Cecil Cranker, MD, [facility]  Electronically Signed    EJL/MedQ  DD: 04/30/2006  DT: 04/30/2006  Job #: [id number]   cc:   Arta Silence, MD

## 2010-10-13 NOTE — Assessment & Plan Note (Signed)
[facility]                            CARDIOLOGY OFFICE NOTE   NAME:[name], [name] HINZMAN                         MRN:          [id number]  DATE:04/30/2006                            DOB:          06-08-1933    ADDENDUM:  The patient did have carotid Dopplers in December 2006,  revealing 0-39% bilateral stenosis, mild right ventricular __________  stenosis.   This is to be repeated in a year.     Cecil Cranker, MD, [facility]     EJL/MedQ  DD: 04/30/2006  DT: 04/30/2006  Job #: [id number]

## 2010-10-13 NOTE — Consult Note (Signed)
[name], KENT NO.:  [id number]   MEDICAL RECORD NO.:  [id number]          PATIENT TYPE:  INP   LOCATION:                               FACILITY:  [facility]   PHYSICIAN:  Paola A. Duard Brady, MD    DATE OF BIRTH:  21-Jun-1933   DATE OF CONSULTATION:  11/12/2007  DATE OF DISCHARGE:  11/07/2007                                 CONSULTATION   Ms. Edwards is a very pleasant 77 year old who was seen in consultation in  June for a grade 2 endometrial carcinoma with the comment they may have  a foci suspicious for a serous carcinoma.  On November 04, 2007, she  underwent exploratory laparotomy, TAH-BSO, pelvic and periaortic lymph  node dissection by Dr. Stanford Breed.  Final pathology was consistent  with a stage IB grade 2 endometrioid adenocarcinoma.  She had 0.5 cm/1.2  cm of the thickness.  There is no myometrial invasion, negative cervix,  washings were negative and 0/16 lymph nodes were involved.  She comes in  today for staple removal.  She is overall doing quite well.  She does  require some more pain medications but is having no significant issues.   PHYSICAL EXAMINATION:  Well-healed vertical skin incision.  Staples  removed without any difficulty and Steri-Strips were applied.   ASSESSMENT:  A 77 year old with a stage IB grade 2 endometrioid  adenocarcinoma.  Based on GOG criteria, she does meet criteria for high  intermediate risk group due to age and grade of tumor.  We would  recommend  consideration of vaginal cuff radiation.  We will subsequently refer her  to Dr. Roselind Messier.  The rationale for this was discussed with the patient  and she is willing to make a consultative visit with Dr. Roselind Messier.  She  will return to see [place] for a routine postoperative check.      Paola A. Duard Brady, MD  Electronically Signed     PAG/MEDQ  D:  11/12/2007  T:  11/12/2007  Job:  [id number]   cc:   Veverly Fells. Excell Seltzer, MD  [address]  [place], [place] [address]   Arta Silence, MD  Fax: [phone]   Lesly Dukes, M.D.   Telford Nab, R.N.  [address]  [place], [place] [address]   Billie Lade, M.D.  Fax: [phone]

## 2010-10-13 NOTE — Assessment & Plan Note (Signed)
[facility]                              CARDIOLOGY OFFICE NOTE   NAME:Cid, JONIQUA SIDLE                         MRN:          [id number]  DATE:02/14/2006                            DOB:          23-Feb-1934    The patient is a very pleasant 77 year old white female with mild to  moderate aortic stenosis, carotid bruits, controlled hypertension, and  controlled hyperlipidemia.  The patient has been getting along quite well.  She has had a systolic murmur since 1996.  An echocardiogram in 2001  revealed no gradient.  In June 2006, there was a mean gradient of 15 with a  peak of 37, AV-max 35.  In June of this year, the echo revealed there was  progression with mean gradient of 30, peak of 51, and peak AV velocity of  358.  The patient has had no dizziness, shortness of breath, or chest  discomfort.  She does note occasional left arm pain at night which may  persist for several minutes.  She has noted this since June, it occurs quite  infrequently.   MEDICATIONS:  Simvastatin which she stopped recently and was restarted a few  weeks ago, hydrochlorothiazide 12.5, Dilacor 240, aspirin 325.   PHYSICAL EXAMINATION:  VITAL SIGNS:  Blood pressure 136/68, pulse 69, normal sinus rhythm.  GENERAL:  Appearance normal.  NECK:  JVP not elevated.  Carotid pulses are palpable and equal with bruits.  LUNGS:  Clear.  There is 2-3/6 short systolic ejection murmur at the aortic  area, no diastolic murmur.  ABDOMEN:  Unremarkable.  EXTREMITIES:  Normal.   IMPRESSION:  1. Moderate aortic stenosis with significant progression over the past      year.  2. Hyperlipidemia on therapy.  3. Hypertension, treated.  4. Left arm pain, somewhat atypical.   I have suggested she undergo stress testing after review with Dr. Riley Kill  who felt that it would be best probably to do a standard treadmill initially  and then, depending on the results, a nuclear study or other  intervention  could be considered.  I should note the EKG reveals LVH with no significant  ST depression.  We will do the treadmill in the moderate protocol and I will  plan to do this with her.   Thank you for the opportunity to see this nice patient.                              Cecil Cranker, MD, [facility]    EJL/MedQ  DD:  02/14/2006  DT:  02/15/2006  Job #:  [id number]   cc:   Arta Silence, MD  Atha Starks. Bean, FNP

## 2010-10-13 NOTE — Consult Note (Signed)
[name], [name] NO.:  [id number]   MEDICAL RECORD NO.:  [id number]          PATIENT TYPE:  OUT   LOCATION:  GYN                          FACILITY:  [facility]   PHYSICIAN:  De Blanch, M.D.DATE OF BIRTH:  1933-07-15   DATE OF CONSULTATION:  DATE OF DISCHARGE:                                 CONSULTATION   CHIEF COMPLAINT:  Endometrial cancer.   INTERVAL HISTORY:  The patient returns today for continuing follow-up.  After my last visit with the patient she was seen by Dr. Roselind Messier, and  vaginal brachytherapy was discussed.  The pros and cons apparently were  fully evaluated and the patient decided not to have any brachytherapy.   HISTORY OF PRESENT ILLNESS:  Stage Ib grade 2 endometrial carcinoma  (invasive to 0.5 cm of 1.2 cm myometrial thickness).  All lymph nodes  and washings were negative.  The patient chose not to have any adjuvant  therapy.   PAST MEDICAL HISTORY:   MEDICAL ILLNESSES:  Aortic stenosis, hypertension, hypercholesterolemia.   CURRENT MEDICATIONS:  Hydrochlorothiazide, baby aspirin, diltiazem,  simvastatin, calcium and melatonin.   DRUG ALLERGIES:  None   PERSONAL AND SOCIAL HISTORY:  The patient is married and exercises  regularly. Prior one-pack-per-day smoker, quitting smoking in 2001.   FAMILY HISTORY:  Maternal aunt with postmenopausal breast cancer.   REVIEW OF SYSTEMS:  A 10-point coverage review of systems was negative  except as noted above.   PHYSICAL EXAM:  GENERAL APPEARANCE:  Physical exam reveals a well-  developed African American female in no acute distress.  HEENT:  Negative.  NECK:  Supple without thyromegaly. There is no supraclavicular or  inguinal adenopathy.  ABDOMEN:  Soft, nontender.  No mass, organomegaly, ascites or hernias  noted.  Midline incision is well healed.  PELVIC:  Bimanual rectovaginal exam: EG, BUS, vagina, urethra are  normal.  Cervix and uterus are surgically absent.  Adnexa  without  masses.  Rectovaginal exam confirms.  LOWER EXTREMITIES:  Without edema or varicosities.   IMPRESSION:  Stage IB grade 2 endometrial cancer, doing well with no  evidence recurrent disease.   Will have the patient return to see [place] in 3 months for continuing follow-  up.      De Blanch, M.D.  Electronically Signed     DC/MEDQ  D:  03/28/2008  T:  03/29/2008  Job:  [id number]   cc:   Lesly Dukes, M.D.   Veverly Fells. Excell Seltzer, MD  [address]  [place], [place] [address]   Arta Silence, MD  Fax: [phone]   Telford Nab, R.N.  [phone] [address]  [place], [place] [address]

## 2010-10-13 NOTE — Assessment & Plan Note (Signed)
[facility]                            CARDIOLOGY OFFICE NOTE   NAME:[name], [name]                         MRN:          [id number]  DATE:09/10/2006                            DOB:          August 05, 1933    Patient is a very pleasant 76 year old active white female with moderate  aortic stenosis.  Most recent echo November 22, 2005 reveals a peak aortic  velocity of 358 with a mean gradient of 30, and a peak rate of 51, EF of  70%.   Patient is asymptomatic.  She had a stress test revealing no ischemia or  scar.   She has had hypertension, which is poorly controlled.  She also has a  history of hyperlipidemia.  She is feeling quite well at this time.   She is on:  1. Hydrochlorothiazide 12.5.  2. Aspirin 162.  3. Diltiazem 360.  4. Simvastatin 20.  5. Benadryl 50.   Blood pressure 156/88.  Pulse 78.  Normal sinus rhythm.  GENERAL APPEARANCE:  Normal.  JVP is not elevated.  Carotid pulses are palpable and equal with  bilateral bruits, probably transmitted.  LUNGS:  Clear.  CARDIAC EXAM:  Reveals 3/6 moderate systolic ejection murmur in the  aortic area radiating to the neck systolic murmur.  ABDOMINAL EXAM:  Reveals easily palpable aorta.  EXTREMITIES:  Reveal no edema, and good pulses.   Prior abdominal ultrasound 3 years ago revealed no evidence of aneurysm.  A carotid Doppler a year and a half ago revealed mild right vertebral  ostial stenosis and zero to 39% bilateral internal carotid stenosis.  EKG revealed normal sinus rhythm, moderate LVH, and is essentially  unchanged.   IMPRESSION:  1. Moderate to severe aortic stenosis.  2. Hypertension, poorly controlled.  3. Hyperlipidemia.  On therapy.  4. Carotid bruits.   I have suggested that patient have a followup 2D echo, lipid, LFT, and  BNP.  I will see her back in 6 weeks, or p.r.n.   ADDENDUM:  Because of the persistent hypertension, I suggest adding  Altace 10 mg daily     E.  Graceann Congress, MD, [facility]  Electronically Signed    EJL/MedQ  DD: 09/10/2006  DT: 09/10/2006  Job #: [id number]   cc:   Arta Silence, MD

## 2010-10-30 ENCOUNTER — Encounter: Payer: Self-pay | Admitting: Cardiovascular Disease

## 2010-10-30 ENCOUNTER — Ambulatory Visit (INDEPENDENT_AMBULATORY_CARE_PROVIDER_SITE_OTHER): Payer: Medicare Other | Admitting: Cardiovascular Disease

## 2010-10-30 VITALS — BP 132/72 | HR 64 | Resp 18 | Ht 65.0 in | Wt 194.0 lb

## 2010-10-30 DIAGNOSIS — E785 Hyperlipidemia, unspecified: Secondary | ICD-10-CM

## 2010-10-30 DIAGNOSIS — I359 Nonrheumatic aortic valve disorder, unspecified: Secondary | ICD-10-CM

## 2010-10-30 DIAGNOSIS — I10 Essential (primary) hypertension: Secondary | ICD-10-CM

## 2010-10-30 NOTE — Patient Instructions (Addendum)
No medication changes today.    Call our office when you are ready for Dr Excell Seltzer to refer you to the cardiovascular surgeon.  Your physician wants you to follow-up in: 6 months with Dr Excell Seltzer.You will receive a reminder letter in the mail two months in advance. If you don't receive a letter, please call our office to schedule the follow-up appointment.

## 2010-10-30 NOTE — Assessment & Plan Note (Signed)
The patient has severe aortic stenosis. She has associated left ventricular hypertrophy and preserved LV function with an ejection fraction of 55-60%. I reviewed her previous echocardiograms and she has had slow progression of aortic stenosis dating back to 2001 when she had no significant gradient. In 2006 her mean and peak gradients were 15 and 37, and 2008 they were 34 and 63, and with her recent study in March 2012 they're up to 50 and 76, respectively. The patient has fairly mild symptoms at this point. I have recommended consideration of surgical aortic valve replacement. I discussed this in detail with the patient understanding that her aortic stenosis will continue to progress and at present time she is a very good candidate for surgical replacement. She would like to wait until the summer is over but seems agreeable to considering surgery. We've scheduled her back for followup in 6 months but she tells me that she will probably call in September to be set up for surgical consultation.

## 2010-10-30 NOTE — Assessment & Plan Note (Signed)
The patient is intolerant to multiple lipid-lowering therapies. LDL noted to be greater than 140.

## 2010-10-30 NOTE — Assessment & Plan Note (Signed)
Controlled on current medical therapy. Will continue same meds without changes.

## 2010-10-30 NOTE — Progress Notes (Signed)
HPI:  This is a 77 year old woman with hypertension, hyperlipidemia, and aortic stenosis, presenting for followup evaluation. Her most recent echocardiogram was performed July 31, 2010 and demonstrated mild LVH with normal systolic function. The aortic valve annulus was severely calcified. The peak mitral valve gradient was 76 mmHg and mean gradient was 50 mm mercury. These findings were felt to be consistent with severe aortic stenosis. The valve area calculated to 0.7 cm.  The patient denies chest pain or pressure. She denies exertional dyspnea or syncope. She does complain of fatigue with activity and exercise intolerance but is fairly vague with her description of this. She complains of leg edema worse in the afternoon and evening. She states "I'm just not able to do as much as I used to be able to do."  Despite that complaint she remains very active.  Outpatient Encounter Prescriptions as of 10/30/2010  Medication Sig Dispense Refill  . aspirin 325 MG tablet Take 325 mg by mouth. Take 1/2 tablet daily       . carvedilol (COREG) 25 MG tablet Take 25 mg by mouth 2 (two) times daily.        . Cholecalciferol (VITAMIN D) 1000 UNITS capsule Take 1,000 Units by mouth 2 (two) times daily.        Marland Kitchen diltiazem (CARDIZEM CD) 360 MG 24 hr capsule Take 360 mg by mouth daily.        . hydrochlorothiazide 25 MG tablet Take 25 mg by mouth daily.        . Omega-3 Fatty Acids (FISH OIL) 1000 MG CAPS Take by mouth 2 (two) times daily.        . potassium chloride SA (K-DUR,KLOR-CON) 20 MEQ tablet Take 20 mEq by mouth 2 (two) times daily.        Marland Kitchen DISCONTD: aspirin 81 MG tablet Take 81 mg by mouth daily.        Marland Kitchen DISCONTD: diphenhydramine-acetaminophen (ACETAMINOPHEN PM) 25-500 MG TABS Take 1 tablet by mouth at bedtime as needed.       Marland Kitchen DISCONTD: Psyllium (METAMUCIL) 30.9 % POWD Take by mouth daily.          Allergies  Allergen Reactions  . Calcium     REACTION: constipation  . Ezetimibe     REACTION: fatigue    . Ramipril     REACTION: abdominal pain  . Simvastatin     REACTION: leg pain and body aches  . Valsartan     REACTION: abdominal pain    Past Medical History  Diagnosis Date  . Unspecified constipation   . Blood in stool   . Family history of colonic polyps   . Other abnormal glucose   . Pain in limb   . Edema   . Malignant neoplasm of corpus uteri, except isthmus   . Pain in joint, shoulder region   . Malignant neoplasm of corpus uteri, except isthmus   . Unspecified hearing loss   . Other symptoms involving cardiovascular system 12/1999    Echo, normal. Transthoracic echo LV function: vigorous  10/2005  . Aortic valve disorders 04/2005    carotid dopplers- mild plague bilateral/ aortic stenosis  . Alcohol abuse, unspecified   . Dyskinesia of esophagus   . Diaphragmatic hernia without mention of obstruction or gangrene   . Tobacco use disorder   . Internal hemorrhoids without mention of complication   . External hemorrhoids without mention of complication   . Disorder of bone and cartilage, unspecified   .  Diverticulosis of colon (without mention of hemorrhage)   . Postmenopausal bleeding   . Unspecified essential hypertension   . Other and unspecified hyperlipidemia     ROS: Negative except as per HPI  BP 132/72  Pulse 64  Resp 18  Ht 5\' 5"  (1.651 m)  Wt 194 lb (87.998 kg)  BMI 32.28 kg/m2  LMP 05/29/2007  PHYSICAL EXAM: Pt is alert and oriented, NAD HEENT: normal Neck: JVP - normal, carotids 2+= with bilateral bruits (suspect transmitted cardiac murmur) Lungs: CTA bilaterally CV: RRR with grade 3/6 harsh systolic crescendo/decrescendo murmur at the left lower sternal border and right upper sternal border Abd: soft, NT, Positive BS, no hepatomegaly Ext: no C/C/E, distal pulses intact and equal Skin: warm/dry no rash  EKG:  Normal sinus rhythm 63 beats per minute, left ventricular hypertrophy with repolarization abnormality.  ASSESSMENT AND PLAN:

## 2011-01-09 ENCOUNTER — Other Ambulatory Visit: Payer: Self-pay | Admitting: Family Medicine

## 2011-01-09 DIAGNOSIS — Z1231 Encounter for screening mammogram for malignant neoplasm of breast: Secondary | ICD-10-CM

## 2011-01-09 DIAGNOSIS — Z9889 Other specified postprocedural states: Secondary | ICD-10-CM

## 2011-01-15 ENCOUNTER — Ambulatory Visit
Admission: RE | Admit: 2011-01-15 | Discharge: 2011-01-15 | Disposition: A | Discharge status: Home / Self Care | Payer: Medicare Other | Source: Ambulatory Visit | Attending: Family Medicine | Admitting: Family Medicine

## 2011-01-15 DIAGNOSIS — Z9889 Other specified postprocedural states: Secondary | ICD-10-CM

## 2011-01-15 LAB — HM MAMMOGRAPHY: HM Mammogram: NORMAL

## 2011-02-22 LAB — BASIC METABOLIC PANEL
Calcium: 9
Creatinine, Ser: 0.74
GFR calc Af Amer: >60

## 2011-02-22 LAB — CBC
HCT: 42
Hemoglobin: 14.8
MCV: 88.2
RBC: 4.56
RDW: 12.8
WBC: 10.9 — ABNORMAL HIGH
WBC: 6.2

## 2011-02-22 LAB — DIFFERENTIAL
Lymphocytes Relative: 18
Lymphs Abs: 1.1
Neutrophils Relative %: 75

## 2011-02-22 LAB — COMPREHENSIVE METABOLIC PANEL
Alkaline Phosphatase: 51
BUN: 19
Chloride: 106
Creatinine, Ser: 0.72
Glucose, Bld: 95
Potassium: 3.6
Total Bilirubin: 0.8

## 2011-02-22 LAB — ABO/RH: ABO/RH(D): A POS

## 2011-02-22 LAB — TYPE AND SCREEN
ABO/RH(D): A POS
Antibody Screen: NEGATIVE

## 2011-03-02 ENCOUNTER — Other Ambulatory Visit (HOSPITAL_COMMUNITY)
Admission: RE | Admit: 2011-03-02 | Discharge: 2011-03-02 | Disposition: A | Discharge status: Home / Self Care | Payer: Medicare Other | Source: Ambulatory Visit | Attending: Gynecology | Admitting: Gynecology

## 2011-03-02 ENCOUNTER — Ambulatory Visit: Payer: Medicare Other | Attending: Gynecology | Admitting: Gynecology

## 2011-03-02 ENCOUNTER — Other Ambulatory Visit: Payer: Self-pay | Admitting: Gynecology

## 2011-03-02 DIAGNOSIS — Z8540 Personal history of malignant neoplasm of unspecified female genital organ: Secondary | ICD-10-CM | POA: Insufficient documentation

## 2011-03-02 DIAGNOSIS — Z7982 Long term (current) use of aspirin: Secondary | ICD-10-CM | POA: Insufficient documentation

## 2011-03-02 DIAGNOSIS — E785 Hyperlipidemia, unspecified: Secondary | ICD-10-CM | POA: Insufficient documentation

## 2011-03-02 DIAGNOSIS — I10 Essential (primary) hypertension: Secondary | ICD-10-CM | POA: Insufficient documentation

## 2011-03-02 DIAGNOSIS — Z79899 Other long term (current) drug therapy: Secondary | ICD-10-CM | POA: Insufficient documentation

## 2011-03-02 DIAGNOSIS — C549 Malignant neoplasm of corpus uteri, unspecified: Secondary | ICD-10-CM | POA: Insufficient documentation

## 2011-03-05 NOTE — Consult Note (Signed)
NAMERAND, BOLLER NO.:  [id number]  MEDICAL RECORD NO.:  [id number]  LOCATION:  GYN                          FACILITY:  [facility]  PHYSICIAN:  De Blanch, M.D.DATE OF BIRTH:  April 17, 1934  DATE OF CONSULTATION:  03/02/2011 DATE OF DISCHARGE:                                CONSULTATION   CHIEF COMPLAINT:  Endometrial cancer.  INTERVAL HISTORY:  The patient returns today for continuing followup. Since her last visit, she has done well.  She denies any GI or GU symptoms, has no pelvic pain, pressure, vaginal bleeding or discharge.  This past year, the patient has had a number of other medical issues including having bilateral cataract replacement and a flare of shingles. She is going to schedule replacement of a heart valve in the near future.  With regard to her breast cancer, she has had negative mammograms recently.  HISTORY OF PRESENT ILLNESS:  Stage IB grade 2 endometrial cancer, undergoing initial surgery, June 2009.  Surgery included TAH-BSO, pelvic and periaortic lymphadenectomy.  PAST MEDICAL HISTORY: 1. Breast cancer, treated with lumpectomy and MammoSite Radiation     Therapy. 2. Hypertension. 3. Hyperlipidemia. 4. Aortic stenosis. 5. Endometrial cancer. 6. Cataracts.  PAST SURGICAL HISTORY: 1. TAH-BSO, pelvic and periaortic lymphadenectomy 2009. 2. Rotator cuff repair. 3. Breast lumpectomy, 2011.  CURRENT MEDICATIONS: 1. Diltiazem. 2. Carvedilol. 3. Baby aspirin. 4. Calcium. 5. Lasix 40 mg daily. 6. Potassium chloride. 7. Vitamin D.  DRUG ALLERGIES:  None.  SOCIAL HISTORY:  The patient is married.  She is a prior smoker, but quit in 2001.  FAMILY HISTORY:  Maternal aunt with postmenopausal breast cancer.  There is no colon cancer or endometrial cancer in the family history.  REVIEW OF SYSTEMS:  10-point comprehensive review of systems negative except as noted above.  PHYSICAL EXAMINATION:  VITAL SIGNS:  Weight 199  pounds.  Blood pressure 138/78, pulse 70, respiratory rate 18. GENERAL:  The patient is a healthy white female in no acute distress. HEENT:  Negative. NECK:  Supple without thyromegaly or supraclavicular or inguinal adenopathy. ABDOMEN:  Obese, soft, nontender.  No mass, organomegaly, ascites, or hernias are noted.  Midline incision is well-healed. PELVIC:  EG/BUS, vagina, and urethra are normal.  Cervix and uterus surgically absent.  Vaginal cuff has no lesions.  Bimanual rectovaginal exam reveal no masses, induration, or nodularity.  The patient does have prominent hemorrhoids.  IMPRESSION:  Stage IB grade 2 endometrial cancer 2009, no evidence of recurrent disease.  PLAN:  Pap smears were obtained.  The patient is encouraged well to see Dr. Shawnie Pons in 6 months.  She is encouraged to obtain colonoscopy and she will schedule this to her primary care physician.  She will return to see me in 1 year.     De Blanch, M.D.     DC/MEDQ  D:  03/02/2011  T:  03/02/2011  Job:  [id number]  cc:   Telford Nab, R.N. [address] [place], [place] [address]  Shelbie Proctor. Shawnie Pons, M.D.  Marne A. Milinda Antis, MD Fax: [phone]  Veverly Fells. Excell Seltzer, MD [address] [place], [place] [address]  Billie Lade, Ph.D., M.D.  Fax: [phone]  Angelia Mould. Derrell Lolling, M.D. [address] [place] [address]  Electronically Signed by De Blanch M.D. on 03/05/2011 09:16:18 AM

## 2011-03-17 ENCOUNTER — Other Ambulatory Visit: Payer: Self-pay | Admitting: Family Medicine

## 2011-03-20 NOTE — Telephone Encounter (Signed)
[facility] request refill Carvedilol 25 mg #180 x 1. Pt already has appt with Dr Milinda Antis.

## 2011-03-22 ENCOUNTER — Telehealth: Payer: Self-pay | Admitting: Family Medicine

## 2011-03-22 ENCOUNTER — Other Ambulatory Visit (INDEPENDENT_AMBULATORY_CARE_PROVIDER_SITE_OTHER): Payer: Medicare Other

## 2011-03-22 DIAGNOSIS — E785 Hyperlipidemia, unspecified: Secondary | ICD-10-CM

## 2011-03-22 DIAGNOSIS — I10 Essential (primary) hypertension: Secondary | ICD-10-CM

## 2011-03-22 DIAGNOSIS — R7309 Other abnormal glucose: Secondary | ICD-10-CM

## 2011-03-22 LAB — LIPID PANEL
Cholesterol: 133 mg/dL (ref 0–200)
HDL: 50.4 mg/dL (ref >39.00)
VLDL: 10.6 mg/dL (ref 0.0–40.0)

## 2011-03-22 LAB — HEMOGLOBIN A1C: Hgb A1c MFr Bld: 5.4 % (ref 4.6–6.5)

## 2011-03-22 LAB — AST: AST: 30 U/L (ref 0–37)

## 2011-03-22 NOTE — Telephone Encounter (Signed)
Message copied by Judy Pimple on Thu Mar 22, 2011  7:23 AM ------      Message from: Alvina Chou      Created: Tue Mar 20, 2011  3:27 PM      Regarding: labs Thursday 10-25       6 month f/u labs.

## 2011-03-27 ENCOUNTER — Ambulatory Visit (INDEPENDENT_AMBULATORY_CARE_PROVIDER_SITE_OTHER): Payer: Medicare Other | Admitting: Family Medicine

## 2011-03-27 ENCOUNTER — Encounter: Payer: Self-pay | Admitting: Family Medicine

## 2011-03-27 VITALS — BP 134/76 | HR 64 | Temp 98.4°F | Ht 65.0 in | Wt 196.5 lb

## 2011-03-27 DIAGNOSIS — R7309 Other abnormal glucose: Secondary | ICD-10-CM

## 2011-03-27 DIAGNOSIS — Z23 Encounter for immunization: Secondary | ICD-10-CM

## 2011-03-27 DIAGNOSIS — I10 Essential (primary) hypertension: Secondary | ICD-10-CM

## 2011-03-27 DIAGNOSIS — E785 Hyperlipidemia, unspecified: Secondary | ICD-10-CM

## 2011-03-27 NOTE — Patient Instructions (Signed)
We need to bring down cholesterol more Avoid red meat/ fried foods/ egg yolks/ fatty breakfast meats/ butter, cheese and high fat dairy/ and shellfish  Here are some handouts as well  You are doing a good job with sugar  Goal will also be weight loss  Call Dr Excell Seltzer and tell him you are ready for a surgeon referral  Follow up for 30 minute annual check up in 6 months with labs prior please

## 2011-03-27 NOTE — Progress Notes (Signed)
Subjective:    Patient ID: [name], female    DOB: 04-07-1934, 77 y.o.   MRN: [id number]  HPI Here for f/u of HTN and hyperlipidemia and hyperglycemia Is feeling ok overall   Wt is up 2 lb with bmi of 32 Has gained a bit by her scale- has swelling in feet   Alt up a bit to 40-not taking any tylenol, alcohol -- is less than once per month   Lipids Lab Results  Component Value Date   CHOL 133 03/22/2011   CHOL 142 09/18/2010   CHOL 216* 03/16/2010   Lab Results  Component Value Date   HDL 50.40 03/22/2011   HDL 49.00 09/18/2010   HDL 78.46 03/16/2010   Lab Results  Component Value Date   LDLCALC 72 03/22/2011   LDLCALC 80 09/18/2010   LDLCALC 74 08/29/2009   Lab Results  Component Value Date   TRIG 53.0 03/22/2011   TRIG 63.0 09/18/2010   TRIG 119.0 03/16/2010   Lab Results  Component Value Date   CHOLHDL 3 03/22/2011   CHOLHDL 3 09/18/2010   CHOLHDL 4 03/16/2010   Lab Results  Component Value Date   LDLDIRECT 147.6 03/16/2010   not tol of chol meds so far -- had horrible experience  Diet-is pretty good overall - occ eats cholesterol  LDL direct 147 but LDL calc is 72 No cp- but has other symptoms  No longer a smoker   HTN is in good control  133/76 No cp or ha or palpittations Has aortic stenosis-followed by cardiology= She wants to go ahead and talk to a vascular surgeon about heart valve replacement Sob easily and really tired     hyperglycema is stable with a1c of 5.4 Diet - trying to avoid sugar and simple carbs best she can Wants to work on wt loss No excessive urination or thirst  Patient Active Problem List  Diagnoses  . Malignant Neoplasm of Corpus Uteri, except Isthmus  . VITAMIN D DEFICIENCY  . HYPERLIPIDEMIA  . ALCOHOL ABUSE  . NICOTINE ADDICTION  . HEARING LOSS, LEFT EAR  . HYPERTENSION  . AORTIC STENOSIS  . HEMORRHOIDS, INTERNAL  . EXTERNAL HEMORRHOIDS  . ESOPHAGEAL SPASM  . HIATAL HERNIA  . DIVERTICULOSIS, COLON  .  CONSTIPATION  . BLOOD IN STOOL  . OSTEOPENIA  . DEPENDENT EDEMA, LEGS  . CAROTID BRUITS, BILATERAL  . HYPERGLYCEMIA   Past Medical History  Diagnosis Date  . Unspecified constipation   . Blood in stool   . Family history of colonic polyps   . Other abnormal glucose   . Pain in limb   . Edema   . Malignant neoplasm of corpus uteri, except isthmus   . Pain in joint, shoulder region   . Malignant neoplasm of corpus uteri, except isthmus   . Unspecified hearing loss   . Other symptoms involving cardiovascular system 12/1999    Echo, normal. Transthoracic echo LV function: vigorous  10/2005  . Aortic valve disorders 04/2005    carotid dopplers- mild plague bilateral/ aortic stenosis  . Alcohol abuse, unspecified   . Dyskinesia of esophagus   . Diaphragmatic hernia without mention of obstruction or gangrene   . Tobacco use disorder   . Internal hemorrhoids without mention of complication   . External hemorrhoids without mention of complication   . Disorder of bone and cartilage, unspecified   . Diverticulosis of colon (without mention of hemorrhage)   . Postmenopausal bleeding   . Unspecified essential  hypertension   . Other and unspecified hyperlipidemia    Past Surgical History  Procedure Date  . Rotator cuff repair 05/2008    right  . 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 Breast lumpectomy 11/2009    DCIS, neg sentinel lymph node biopsy  . Laparoscopy w/ total bilateral pelvic and peri-aortic lymphadenectomy 11/04/2007  . Total abdominal hysterectomy w/ bilateral salpingoophorectomy 11/04/07    salpingo-oophrectomy  . Cataract extraction    History  Substance Use Topics  . Smoking status: Former Smoker    Quit date: 05/29/1999  . Smokeless tobacco: Not on file  . Alcohol Use: No   Family History  Problem Relation Age of Onset  . Colon polyps Sister     2010  . Cancer Maternal Aunt     possible post menopausal breast cancer  . Heart disease Mother   . Stroke Father     Allergies  Allergen Reactions  . Calcium     REACTION: constipation  . Ezetimibe     REACTION: fatigue  . Ramipril     REACTION: abdominal pain  . Simvastatin     REACTION: leg pain and body aches  . Valsartan     REACTION: abdominal pain   Current Outpatient Prescriptions on File Prior to Visit  Medication Sig Dispense Refill  . aspirin 325 MG tablet Take 325 mg by mouth. Take 1/2 tablet daily       . carvedilol (COREG) 25 MG tablet TAKE 1 TABLET TWICE A DAY  180 tablet  1  . Cholecalciferol (VITAMIN D) 1000 UNITS capsule Take 1,000 Units by mouth 2 (two) times daily.        Marland Kitchen diltiazem (CARDIZEM CD) 360 MG 24 hr capsule Take 360 mg by mouth daily.        . Omega-3 Fatty Acids (FISH OIL) 1000 MG CAPS Take by mouth 2 (two) times daily.        . potassium chloride SA (K-DUR,KLOR-CON) 20 MEQ tablet Take 20 mEq by mouth 2 (two) times daily.              Review of Systems Review of Systems  Constitutional: Negative for fever, appetite change, and unexpected weight change. fatigued at times Eyes: Negative for pain and visual disturbance.  Respiratory: Negative for cough and pos for sob with poor exercise tolerance  Cardiovascular: Negative for cp or palpitations    Gastrointestinal: Negative for nausea, diarrhea and constipation.  Genitourinary: Negative for urgency and frequency.  Skin: Negative for pallor or rash   Neurological: Negative for weakness, light-headedness, numbness and headaches.  Hematological: Negative for adenopathy. Does not bruise/bleed easily.  Psychiatric/Behavioral: Negative for dysphoric mood. The patient is not nervous/anxious but much stress .           Objective:   Physical Exam  Constitutional: She appears well-developed and well-nourished. No distress.       overwt and well appearing   HENT:  Head: Normocephalic and atraumatic.  Mouth/Throat: Oropharynx is clear and moist.  Eyes: Conjunctivae and EOM are normal. Pupils are equal, round, and  reactive to light. No scleral icterus.  Neck: Normal range of motion. Neck supple. No JVD present. Carotid bruit is not present. No thyromegaly present.  Cardiovascular: Normal rate, regular rhythm, normal heart sounds and intact distal pulses.  Exam reveals no gallop.   Pulmonary/Chest: Effort normal and breath sounds normal. No respiratory distress. She has no wheezes.  Abdominal: Soft. Bowel sounds are normal. She exhibits  no distension, no abdominal bruit and no mass. There is no tenderness.  Musculoskeletal: She exhibits no edema and no tenderness.  Lymphadenopathy:    She has no cervical adenopathy.  Neurological: She is alert. She has normal reflexes. No cranial nerve deficit. She exhibits normal muscle tone. Coordination normal.  Skin: Skin is warm and dry. No rash noted. No erythema. No pallor.  Psychiatric: She has a normal mood and affect.          Assessment & Plan:

## 2011-03-28 ENCOUNTER — Other Ambulatory Visit: Payer: Self-pay | Admitting: Cardiovascular Disease

## 2011-03-29 NOTE — Assessment & Plan Note (Signed)
Lab Results  Component Value Date   CHOL 133 03/22/2011   CHOL 142 09/18/2010   CHOL 216* 03/16/2010   Lab Results  Component Value Date   HDL 50.40 03/22/2011   HDL 49.00 09/18/2010   HDL 78.46 03/16/2010   Lab Results  Component Value Date   LDLCALC 72 03/22/2011   LDLCALC 80 09/18/2010   LDLCALC 74 08/29/2009   Lab Results  Component Value Date   TRIG 53.0 03/22/2011   TRIG 63.0 09/18/2010   TRIG 119.0 03/16/2010   Lab Results  Component Value Date   CHOLHDL 3 03/22/2011   CHOLHDL 3 09/18/2010   CHOLHDL 4 03/16/2010   Lab Results  Component Value Date   LDLDIRECT 147.6 03/16/2010   overall fair control Disc goals for lipids and reasons to control them Rev labs with pt Rev low sat fat diet in detail

## 2011-03-29 NOTE — Assessment & Plan Note (Signed)
Doing well with lifestyle change Lab Results  Component Value Date   HGBA1C 5.4 03/22/2011   rev low glycemic diet and need for wt loss

## 2011-03-29 NOTE — Assessment & Plan Note (Signed)
bp in good control which is impt in light of AS Will look into surgery for that soon Stressed healthy lifestyle

## 2011-05-07 ENCOUNTER — Ambulatory Visit (INDEPENDENT_AMBULATORY_CARE_PROVIDER_SITE_OTHER): Payer: Medicare Other | Admitting: Cardiovascular Disease

## 2011-05-07 ENCOUNTER — Encounter: Payer: Self-pay | Admitting: Cardiovascular Disease

## 2011-05-07 VITALS — BP 130/78 | HR 77 | Ht 65.0 in | Wt 197.0 lb

## 2011-05-07 DIAGNOSIS — I10 Essential (primary) hypertension: Secondary | ICD-10-CM

## 2011-05-07 DIAGNOSIS — I359 Nonrheumatic aortic valve disorder, unspecified: Secondary | ICD-10-CM

## 2011-05-07 NOTE — Patient Instructions (Signed)
You have been referred to Dr Laneta Simmers for Aortic Valve Evaluation.   Your physician has recommended you make the following change in your medication: STOP Diltiazem  Your physician wants you to follow-up in: 6 MONTHS.  You will receive a reminder letter in the mail two months in advance. If you don't receive a letter, please call our office to schedule the follow-up appointment.

## 2011-05-07 NOTE — Progress Notes (Signed)
HPI:  This is a 77 year old woman presenting for followup of severe aortic stenosis. I saw her last in June 2012. At that time she had undergone an echocardiogram which showed progressive aortic stenosis with a peak gradient of 77 mm mercury and a mean gradient of 50 mm mercury a. Her aortic valve area calculated to 0.7 cm.  At that time I discussed amputations of severe aortic stenosis and the patient elected to wait on surgical referral. She presents today for followup and further discussion.  She has had a recent illness with respiratory symptoms and has stopped taking diltiazem. She feels better off of diltiazem and has been measuring her blood pressure regularly. Her home readings have been within normal limits. She does complain of dyspnea with exertion and mild chest tightness. She denies lightheadedness or syncope. She has chronic leg swelling that is unchanged over time. She denies orthopnea, PND, or palpitations.  Outpatient Encounter Prescriptions as of 05/07/2011  Medication Sig Dispense Refill  . aspirin 325 MG tablet Take 325 mg by mouth. Take 1/2 tablet daily       . carvedilol (COREG) 25 MG tablet TAKE 1 TABLET TWICE A DAY  180 tablet  1  . Cholecalciferol (VITAMIN D) 1000 UNITS capsule Take 1,000 Units by mouth 2 (two) times daily.        Marland Kitchen diltiazem (CARDIZEM CD) 360 MG 24 hr capsule Take 360 mg by mouth daily.        Marland Kitchen guaiFENesin (MUCINEX) 600 MG 12 hr tablet Take 1,200 mg by mouth 2 (two) times daily.        . hydrochlorothiazide (HYDRODIURIL) 25 MG tablet TAKE 1 TABLET DAILY  90 tablet  2  . Omega-3 Fatty Acids (FISH OIL) 1000 MG CAPS Take by mouth 2 (two) times daily.        . potassium chloride SA (K-DUR,KLOR-CON) 20 MEQ tablet Take 20 mEq by mouth 2 (two) times daily.        Marland Kitchen DISCONTD: diltiazem (TIAZAC) 360 MG 24 hr capsule TAKE 1 CAPSULE DAILY  90 capsule  1    Allergies  Allergen Reactions  . Calcium     REACTION: constipation  . Ezetimibe     REACTION: fatigue  .  Ramipril     REACTION: abdominal pain  . Simvastatin     REACTION: leg pain and body aches  . Valsartan     REACTION: abdominal pain    Past Medical History  Diagnosis Date  . Unspecified constipation   . Blood in stool   . Family history of colonic polyps   . Other abnormal glucose   . Pain in limb   . Edema   . Malignant neoplasm of corpus uteri, except isthmus   . Pain in joint, shoulder region   . Malignant neoplasm of corpus uteri, except isthmus   . Unspecified hearing loss   . Other symptoms involving cardiovascular system 12/1999    Echo, normal. Transthoracic echo LV function: vigorous  10/2005  . Aortic valve disorders 04/2005    carotid dopplers- mild plague bilateral/ aortic stenosis  . Alcohol abuse, unspecified   . Dyskinesia of esophagus   . Diaphragmatic hernia without mention of obstruction or gangrene   . Tobacco use disorder   . Internal hemorrhoids without mention of complication   . External hemorrhoids without mention of complication   . Disorder of bone and cartilage, unspecified   . Diverticulosis of colon (without mention of hemorrhage)   . Postmenopausal bleeding   .  Unspecified essential hypertension   . Other and unspecified hyperlipidemia     ROS: Negative except as per HPI  BP 130/78  Pulse 77  Ht 5\' 5"  (1.651 m)  Wt 89.359 kg (197 lb)  BMI 32.78 kg/m2  LMP 05/29/2007  PHYSICAL EXAM: Pt is alert and oriented, NAD HEENT: normal Neck: JVP - normal, carotids 2+= with bilateral bruits Lungs: CTA bilaterally CV: RRR with grade 3/6 harsh systolic crescendo decrescendo murmur Abd: soft, NT, Positive BS, no hepatomegaly Ext: no C/C/E, distal pulses intact and equal Skin: warm/dry no rash  EKG:  Normal sinus rhythm with left ventricular hypertrophy and associated repolarization abnormality. Heart rate 77 beats per minute, left atrial enlargement.  ASSESSMENT AND PLAN:

## 2011-05-07 NOTE — Assessment & Plan Note (Signed)
The patient has severe aortic stenosis with associated symptoms. I recommended surgical aortic valve replacement as she is a good candidate. She will be referred for cardiac surgery evaluation. We again discussed that he disease process of aortic stenosis, especially as she has now become symptomatic. She understands the rationale for moving forward with aortic valve replacement. She will be seen as an outpatient by cardiac surgery and I will plan on doing a cardiac catheter with coronary angiography preoperatively. She will call if symptoms progress in the interim.

## 2011-05-07 NOTE — Assessment & Plan Note (Signed)
Blood pressure is within normal limits and the patient is off of diltiazem. She will continue to hold this medication. However like her to continue on her carvedilol and hydrochlorothiazide.

## 2011-05-15 ENCOUNTER — Encounter: Payer: Medicare Other | Admitting: Surgery

## 2011-05-23 ENCOUNTER — Encounter: Payer: Self-pay | Admitting: *Deleted

## 2011-05-23 ENCOUNTER — Institutional Professional Consult (permissible substitution) (INDEPENDENT_AMBULATORY_CARE_PROVIDER_SITE_OTHER): Payer: Medicare Other | Admitting: Surgery

## 2011-05-23 ENCOUNTER — Encounter: Payer: Self-pay | Admitting: Surgery

## 2011-05-23 VITALS — BP 156/88 | HR 72 | Resp 16 | Ht 65.0 in | Wt 197.0 lb

## 2011-05-23 DIAGNOSIS — I359 Nonrheumatic aortic valve disorder, unspecified: Secondary | ICD-10-CM

## 2011-05-23 NOTE — Progress Notes (Signed)
PCP is [name], MD, MD Referring Provider is [name], MD   Chief complaint:  Shortness of breath due to severe aortic stenosis   HPI:  The patient is a 77 year old active woman with a known history of severe aortic stenosis who presents with a several month history of exertional dyspnea particularly with walking. She also reports significant fatigue. She has still been going to a gym and working out 3 days per week which she says she tolerates fairly well. She doesn't really do any walking at the gym but more of a weight training program. Her most recent echocardiogram in June of 2012 reportedly showed her peak aortic valve gradient increased to 77 mmHg and a mean gradient had increased to 50 mm mercury. Aortic valve area was 0.7 cm. She's not had a cardiac catheterization yet.    Past Medical History  Diagnosis Date  . Unspecified constipation   . Blood in stool   . Family history of colonic polyps   . Other abnormal glucose   . Pain in limb   . Edema   . Malignant neoplasm of corpus uteri, except isthmus   . Pain in joint, shoulder region   . Malignant neoplasm of corpus uteri, except isthmus   . Unspecified hearing loss   . Other symptoms involving cardiovascular system 12/1999    Echo, normal. Transthoracic echo LV function: vigorous  10/2005  . Aortic valve disorders 04/2005    carotid dopplers- mild plague bilateral/ aortic stenosis  . Alcohol abuse, unspecified   . Dyskinesia of esophagus   . Diaphragmatic hernia without mention of obstruction or gangrene   . Tobacco use disorder   . Internal hemorrhoids without mention of complication   . External hemorrhoids without mention of complication   . Disorder of bone and cartilage, unspecified   . Diverticulosis of colon (without mention of hemorrhage)   . Postmenopausal bleeding   . Unspecified essential hypertension   . Other and unspecified hyperlipidemia     Past Surgical History  Procedure Date  . Rotator  cuff repair 05/2008    right  . 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 Breast lumpectomy 11/2009    DCIS, neg sentinel lymph node biopsy  . Laparoscopy w/ total bilateral pelvic and peri-aortic lymphadenectomy 11/04/2007  . Total abdominal hysterectomy w/ bilateral salpingoophorectomy 11/04/07    salpingo-oophrectomy  . Cataract extraction     Family History  Problem Relation Age of Onset  . Colon polyps Sister     2010  . Cancer Maternal Aunt     possible post menopausal breast cancer  . Heart disease Mother   . Stroke Father     Social History History  Substance Use Topics  . Smoking status: Former Smoker    Quit date: 05/29/1999  . Smokeless tobacco: Not on file  . Alcohol Use: No    Current Outpatient Prescriptions  Medication Sig Dispense Refill  . aspirin 325 MG tablet Take 325 mg by mouth. Take 1/2 tablet daily       . carvedilol (COREG) 25 MG tablet TAKE 1 TABLET TWICE A DAY  180 tablet  1  . Cholecalciferol (VITAMIN D) 1000 UNITS capsule Take 1,000 Units by mouth 2 (two) times daily.        Marland Kitchen guaiFENesin (MUCINEX) 600 MG 12 hr tablet Take 1,200 mg by mouth 2 (two) times daily.        . hydrochlorothiazide (HYDRODIURIL) 25 MG tablet TAKE 1 TABLET DAILY  90 tablet  2  . Omega-3 Fatty Acids (FISH OIL) 1000 MG CAPS Take by mouth 2 (two) times daily.        . potassium chloride SA (K-DUR,KLOR-CON) 20 MEQ tablet Take 20 mEq by mouth 2 (two) times daily.          Allergies  Allergen Reactions  . Calcium     REACTION: constipation  . Ezetimibe     REACTION: fatigue  . Ramipril     REACTION: abdominal pain  . Simvastatin     REACTION: leg pain and body aches  . Valsartan     REACTION: abdominal pain    Review of Systems:  Gen.: She denies any fever or chills. She's had no recent weight changes. She does report exertional fatigue. Eyes: Negative ENT: Negative. She does see a dentist regularly. Endocrine: She denies diabetes and hypothyroidism. Cardiac: She denies any  chest pain or pressure. She denies PND and orthopnea. She has had bilateral lower extremity edema which has been present for a few years. She denies palpitations. Respiratory: She denies cough and sputum production. GI: She denies any nausea or vomiting. She's had no melena or bright blood per rectum. GU: She denies dysuria and hematuria. Neurological: She denies any focal weakness or numbness. She denies dizziness and syncope. She's never had a TIA or stroke.   LMP 05/29/2007 Physical Exam:  BP 156/88  Pulse 72  Resp 16  Ht 5\' 5"  (1.651 m)  Wt 197 lb (89.359 kg)  BMI 32.78 kg/m2  SpO2 95%  LMP 05/29/2007  She is a well-developed, well-nourished white female in no distress. HEENT: Normocephalic and atraumatic. Pupils are equal and reactive to light and accommodation. Extraocular muscles are intact. Oropharynx is clear. Neck: Carotid pulses are palpable bilaterally. There is a transmitted murmur to both sides of her neck. There is no adenopathy or thyromegaly. Lungs: Clear Heart: Regular rate and rhythm with a grade 3/6 harsh systolic murmur over the aorta. Abdomen: Bowel sounds are positive. Abdomen soft nontender. There are no palpable masses or organomegaly. Extremities: There is mild to moderate bilateral lower extremity edema to the knees. Pedal pulses are palpable bilaterally. Neurological: Alert and oriented x3. Motor and sensory exams are grossly normal.  Diagnostic Tests: --------------------------------------------------------------------  Transthoracic Echocardiography   Patient:    [name], [name]  MR #:       [id number]  Study Date: 07/28/2009  Gender:     F  Age:        68  Height:     167.6cm  Weight:     87.5kg  BSA:        1.29m^2  Pt. Status:  Room:    ADMITTING    [name], MD   ATTENDING    [name], MD   ORDERING     [name], MD   SONOGRAPHER  Aida Raider   PERFORMING   [facility], [facility]  cc:    --------------------------------------------------------------------  Indications:   424.1 Aortic valve disorders.   --------------------------------------------------------------------  History:  PMH: History of AS. History of lower extremity edema. Risk  factors: Hypertension. Dyslipidemia.   --------------------------------------------------------------------  Study Conclusions   - Left ventricle: The cavity size was normal. Wall thickness was    increased in a pattern of mild LVH. Systolic function was normal.    The estimated ejection fraction was in the range of 60% to 65%.    Features are consistent with a pseudonormal left ventricular  filling pattern, with concomitant abnormal relaxation and    increased filling pressure (grade 2 diastolic dysfunction).  - Pericardium, extracardiac: A trivial pericardial effusion was    identified.  Transthoracic echocardiography. M-mode, complete 2D, spectral  Doppler, and color Doppler. Height: Height: 167.6cm. Height: 66in.  Weight: Weight: 87.5kg. Weight: 192.6lb. Body mass index: BMI:  31.2kg/m^2. Body surface area: BSA: 1.55m^2. Blood pressure: 122/80.  Patient status: Outpatient. Location:  [facility]   --------------------------------------------------------------------   --------------------------------------------------------------------  Left ventricle: The cavity size was normal. Wall thickness was  increased in a pattern of mild LVH. Systolic function was normal.  The estimated ejection fraction was in the range of 60% to 65%.  Features are consistent with a pseudonormal left ventricular filling  pattern, with concomitant abnormal relaxation and increased filling  pressure (grade 2 diastolic dysfunction).   --------------------------------------------------------------------  Aortic valve: AV is thickened, calcified with restricted motion.  Peak and mean gradients through the valve are 66 and 42 mm Hg  respectively  consistent with moderately severe to severeAS. Doppler:  No regurgitation.  VTI ratio of LVOT to aortic valve: 0.25. Valve  area: 0.71cm^2(VTI). Indexed valve area: 0.36cm^2/m^2 (VTI). Valve  area: 0.63cm^2 (Vmax). Indexed valve area: 0.32cm^2/m^2 (Vmax).  Mean gradient: 44mm Hg (S). Peak gradient: 71mm Hg (S).   --------------------------------------------------------------------  Mitral valve: Structurally normal valve. Leaflet separation was  normal. Doppler: Transvalvular velocity was within the normal range.  There was no evidence for stenosis. No regurgitation.  Peak  gradient: 4mm Hg (D).   --------------------------------------------------------------------  Left atrium: The atrium was normal in size.   --------------------------------------------------------------------  Right ventricle: The cavity size was normal. Wall thickness was  normal. Systolic function was normal.   --------------------------------------------------------------------  Pulmonic valve: Structurally normal valve. Cusp separation was  normal. Doppler: Transvalvular velocity was within the normal range.  Mild regurgitation.   --------------------------------------------------------------------  Right atrium: The atrium was normal in size.   --------------------------------------------------------------------  Pericardium: A trivial pericardial effusion was identified.   --------------------------------------------------------------------  Systemic veins:  Inferior vena cava: The vessel was normal in size; the respirophasic  diameter changes were in the normal range (= 50%); findings are  consistent with normal central venous pressure.   --------------------------------------------------------------------   2D measurements            Normal  Doppler measurements      Normal  Left ventricle                     Main pulmonary artery  LVID ED,         37 mm     43-52   Pressure, S   19 mm Hg    =30   chord, PLAX                        LVOT  LVID ES,         23 mm     23-38   VTI, S       28. cm       ------  chord, PLAX                                       8  FS, chord,       38 %      >29     Stroke vol   81. ml       ------  PLAX                                              7  LVPW, ED         12 mm     ------  Stroke index 41. ml/m^2   ------  IVS/LVPW          1        <1.3                   4  ratio, ED                          Aortic valve  Ventricular septum                 Peak vel, S  421 cm/s     ------  IVS, ED          12 mm     ------  Mean vel, S  311 cm/s     ------  LVOT                               VTI, S       115 cm       ------  Diam             19 mm     ------  Mean          44 mm Hg    ------  Aorta                              gradient, S  Root diam, ED    26 mm     ------  Peak          71 mm Hg    ------  Left atrium                        gradient, S  AP dim           36 mm     ------  VTI ratio    0.2          ------  AP dim index   1.83 cm/m^2 <2.2    LVOT/AV        5                                     Area, VTI    0.7 cm^2     ------                                                    1                                     Area index   0.3 cm^2/m^2 ------                                     (  VTI)          6                                     Area, Vmax   0.6 cm^2     ------                                                    3                                     Area index   0.3 cm^2/m^2 ------                                     (Vmax)         2                                     Mitral valve                                     Peak E vel   97. cm/s     ------                                                    6                                     Peak A vel   89. cm/s     ------                                                    9                                     Deceleration 275 ms       150-23                                     time                       0                                     Peak           4 mm Hg    ------  gradient, D                                     Peak E/A     1.1          ------                                     ratio                                     Tricuspid valve                                     Regurg peak  187 cm/s     ------                                     vel                                     Peak RV-RA    14 mm Hg    ------                                     gradient, S                                     Systemic veins                                     Estimated      5 mm Hg    ------                                     CVP                                     Right ventricle                                     Pressure, S   19 mm Hg    <30   --------------------------------------------------------------------  Prepared and Electronically Authenticated by   Dietrich Pates, MD  [id number]:[id number]   Impression:  She has severe aortic stenosis which is becoming symptomatic and limiting her active lifestyle. I agree it is time to proceed with aortic valve replacement. She will require cardiac catheterization preoperatively. I would plan to use a tissue valve given her age. I discussed the pros and cons of mechanical and tissue valves with her and my recommendation for a tissue valve. She is in agreement with that. I discussed the operative  procedure with the patient and family including alternatives, benefits and risks; including but not limited to bleeding, blood transfusion, infection, stroke, myocardial infarction, heart block requiring a permanent pacemaker, organ dysfunction, and death.  [name] understands and agrees to proceed.  We will schedule surgery as soon as catheterization is done.  Plan:  She went cardiac catheterization schedule the near future by Dr. [name] and we'll plan to proceed with aortic valve replacement  after that.

## 2011-05-24 ENCOUNTER — Other Ambulatory Visit: Payer: Self-pay

## 2011-05-24 ENCOUNTER — Other Ambulatory Visit: Payer: Self-pay | Admitting: Cardiovascular Disease

## 2011-05-24 DIAGNOSIS — I350 Nonrheumatic aortic (valve) stenosis: Secondary | ICD-10-CM

## 2011-05-25 ENCOUNTER — Other Ambulatory Visit: Payer: Self-pay

## 2011-05-25 DIAGNOSIS — I359 Nonrheumatic aortic valve disorder, unspecified: Secondary | ICD-10-CM

## 2011-05-31 ENCOUNTER — Other Ambulatory Visit (INDEPENDENT_AMBULATORY_CARE_PROVIDER_SITE_OTHER): Payer: Medicare Other | Admitting: *Deleted

## 2011-05-31 DIAGNOSIS — I359 Nonrheumatic aortic valve disorder, unspecified: Secondary | ICD-10-CM | POA: Diagnosis not present

## 2011-05-31 DIAGNOSIS — I350 Nonrheumatic aortic (valve) stenosis: Secondary | ICD-10-CM

## 2011-05-31 LAB — CBC WITH DIFFERENTIAL/PLATELET
Basophils Absolute: 0 10*3/uL (ref 0.0–0.1)
Eosinophils Absolute: 0 10*3/uL (ref 0.0–0.7)
Hemoglobin: 14.8 g/dL (ref 12.0–15.0)
Lymphocytes Relative: 20.5 % (ref 12.0–46.0)
MCHC: 34.7 g/dL (ref 30.0–36.0)
Neutro Abs: 3.7 10*3/uL (ref 1.4–7.7)
Platelets: 141 10*3/uL — ABNORMAL LOW (ref 150.0–400.0)
RDW: 14 % (ref 11.5–14.6)

## 2011-05-31 LAB — BASIC METABOLIC PANEL
BUN: 18 mg/dL (ref 6–23)
CO2: 33 mEq/L — ABNORMAL HIGH (ref 19–32)
Calcium: 10 mg/dL (ref 8.4–10.5)
Creatinine, Ser: 0.8 mg/dL (ref 0.4–1.2)
Glucose, Bld: 101 mg/dL — ABNORMAL HIGH (ref 70–99)

## 2011-05-31 LAB — PROTIME-INR: Prothrombin Time: 11.5 s (ref 10.2–12.4)

## 2011-06-01 ENCOUNTER — Encounter (HOSPITAL_COMMUNITY): Payer: Self-pay | Admitting: Pharmacy Technician

## 2011-06-07 ENCOUNTER — Encounter (HOSPITAL_BASED_OUTPATIENT_CLINIC_OR_DEPARTMENT_OTHER)
Admission: RE | Disposition: A | Discharge status: Home / Self Care | Payer: Self-pay | Source: Ambulatory Visit | Attending: Cardiovascular Disease

## 2011-06-07 ENCOUNTER — Inpatient Hospital Stay (HOSPITAL_BASED_OUTPATIENT_CLINIC_OR_DEPARTMENT_OTHER)
Admission: RE | Admit: 2011-06-07 | Discharge: 2011-06-07 | Disposition: A | Discharge status: Home / Self Care | Payer: Medicare Other | Source: Ambulatory Visit | Attending: Cardiovascular Disease | Admitting: Cardiovascular Disease

## 2011-06-07 ENCOUNTER — Encounter (HOSPITAL_BASED_OUTPATIENT_CLINIC_OR_DEPARTMENT_OTHER): Payer: Self-pay | Admitting: *Deleted

## 2011-06-07 DIAGNOSIS — I359 Nonrheumatic aortic valve disorder, unspecified: Secondary | ICD-10-CM

## 2011-06-07 LAB — POCT I-STAT 3, VENOUS BLOOD GAS (G3P V)
Acid-Base Excess: 3 mmol/L — ABNORMAL HIGH (ref 0.0–2.0)
Bicarbonate: 27.9 mEq/L — ABNORMAL HIGH (ref 20.0–24.0)
Bicarbonate: 29.6 mEq/L — ABNORMAL HIGH (ref 20.0–24.0)
O2 Saturation: 68 %
TCO2: 29 mmol/L (ref 0–100)
TCO2: 31 mmol/L (ref 0–100)
pCO2, Ven: 50 mmHg (ref 45.0–50.0)
pH, Ven: 7.356 — ABNORMAL HIGH (ref 7.250–7.300)
pO2, Ven: 37 mmHg (ref 30.0–45.0)
pO2, Ven: 38 mmHg (ref 30.0–45.0)

## 2011-06-07 LAB — POCT I-STAT 3, ART BLOOD GAS (G3+)
pCO2 arterial: 47.6 mmHg — ABNORMAL HIGH (ref 35.0–45.0)
pH, Arterial: 7.379 (ref 7.350–7.400)

## 2011-06-07 SURGERY — JV LEFT AND RIGHT HEART CATHETERIZATION WITH CORONARY ANGIOGRAM
Anesthesia: Moderate Sedation

## 2011-06-07 MED ORDER — DIAZEPAM 5 MG PO TABS: 5.0000 mg | ORAL_TABLET | ORAL | Status: DC

## 2011-06-07 MED ORDER — ASPIRIN 81 MG PO CHEW: 324.0000 mg | CHEWABLE_TABLET | ORAL | Status: DC

## 2011-06-07 MED ORDER — ACETAMINOPHEN 325 MG PO TABS: 650.0000 mg | ORAL_TABLET | ORAL | Status: DC | PRN

## 2011-06-07 MED ORDER — SODIUM CHLORIDE 0.9 % IV SOLN: 250.0000 mL | INTRAVENOUS | Status: DC | PRN

## 2011-06-07 MED ORDER — SODIUM CHLORIDE 0.9 % IJ SOLN: 3.0000 mL | Freq: Two times a day (BID) | INTRAMUSCULAR | Status: DC

## 2011-06-07 MED ORDER — ONDANSETRON HCL 4 MG/2ML IJ SOLN: 4.0000 mg | Freq: Four times a day (QID) | INTRAMUSCULAR | Status: DC | PRN

## 2011-06-07 MED ORDER — SODIUM CHLORIDE 0.9 % IV SOLN
INTRAVENOUS | Status: DC
  Administered 2011-06-07: 07:00:00 via INTRAVENOUS

## 2011-06-07 MED ORDER — SODIUM CHLORIDE 0.9 % IV SOLN: 1.0000 mL/kg/h | INTRAVENOUS | Status: DC

## 2011-06-07 MED ORDER — SODIUM CHLORIDE 0.9 % IJ SOLN: 3.0000 mL | INTRAMUSCULAR | Status: DC | PRN

## 2011-06-07 NOTE — H&P (Signed)
Patient ID: [name] MRN: [id number] DOB/AGE: [birth year] 78 y.o. Admit date: 06/07/2011  Primary Care Physician:Marne Tower, MD, MD Primary Cardiologist: [name], MD Active Problems:  * No active hospital problems. *   HPI: This is a 78 year old woman presenting for outpatient cardiac catheterization. I saw her in the office December 10. This is an addendum H&P since that office visit is just over 30 days. The patient was independently interviewed before her cardiac catheterization. She was also examined. She reports no interval change in her symptoms. She continues to have stable chest discomfort with exertion. She has mild dyspnea. Both of these symptoms are unchanged. She is scheduled for aortic valve replacement next week and she presents today for preoperative cardiac catheterization to assess her hemodynamics and see if she has significant coronary artery disease. She has no other complaints today.  Past Medical History  Diagnosis Date  . Unspecified constipation   . Blood in stool   . Family history of colonic polyps   . Other abnormal glucose   . Pain in limb   . Edema   . Malignant neoplasm of corpus uteri, except isthmus   . Pain in joint, shoulder region   . Malignant neoplasm of corpus uteri, except isthmus   . Unspecified hearing loss   . Other symptoms involving cardiovascular system 12/1999    Echo, normal. Transthoracic echo LV function: vigorous  10/2005  . Aortic valve disorders 04/2005    carotid dopplers- mild plague bilateral/ aortic stenosis  . Alcohol abuse, unspecified   . Dyskinesia of esophagus   . Diaphragmatic hernia without mention of obstruction or gangrene   . Tobacco use disorder   . Internal hemorrhoids without mention of complication   . External hemorrhoids without mention of complication   . Disorder of bone and cartilage, unspecified   . Diverticulosis of colon (without mention of hemorrhage)   . Postmenopausal bleeding   . Unspecified  essential hypertension   . Other and unspecified hyperlipidemia     Past Surgical History  Procedure Date  . Rotator cuff repair 05/2008    right  . 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 Breast lumpectomy 11/2009    DCIS, neg sentinel lymph node biopsy  . Laparoscopy w/ total bilateral pelvic and peri-aortic lymphadenectomy 11/04/2007  . Total abdominal hysterectomy w/ bilateral salpingoophorectomy 11/04/07    salpingo-oophrectomy  . Cataract extraction     Family History  Problem Relation Age of Onset  . Colon polyps Sister     2010  . Cancer Maternal Aunt     possible post menopausal breast cancer  . Heart disease Mother   . Stroke Father     History   Social History  . Marital Status: Married    Spouse Name: N/A    Number of Children: N/A  . Years of Education: N/A   Occupational History  . Not on file.   Social History Main Topics  . Smoking status: Former Smoker    Quit date: 05/29/1999  . Smokeless tobacco: Not on file  . Alcohol Use: No  . Drug Use: Not on file  . Sexually Active: No   Other Topics Concern  . Not on file   Social History Narrative  . No narrative on file     Prescriptions prior to admission  Medication Sig Dispense Refill  . aspirin 325 MG tablet Take 325 mg by mouth. Take 1/2 tablet daily       . carvedilol (  COREG) 25 MG tablet        . Cholecalciferol (VITAMIN D) 1000 UNITS capsule Take 1,000 Units by mouth 2 (two) times daily.        . hydrochlorothiazide (HYDRODIURIL) 25 MG tablet        . Omega-3 Fatty Acids (FISH OIL) 1000 MG CAPS Take by mouth 2 (two) times daily.        . potassium chloride SA (K-DUR,KLOR-CON) 20 MEQ tablet Take 20 mEq by mouth 2 (two) times daily.          General: no fevers/chills/night sweats/weight loss Eyes: no blurry vision, diplopia, or amaurosis ENT: no sore throat or hearing loss Resp: no cough, wheezing, or hemoptysis CV: Positive for lower extremity edema GI: no abdominal pain, nausea, vomiting, diarrhea,  or constipation GU: no dysuria, frequency, or hematuria Skin: no rash Neuro: no headache, numbness, tingling, or weakness of extremities Musculoskeletal: no joint pain or swelling Heme: no bleeding, DVT, or easy bruising Endo: no polydipsia or polyuria  Physical Exam: Blood pressure 132/76, pulse 68, resp. rate 16, height 5\' 5"  (1.651 m), weight 89.359 kg (197 lb)  Pt is alert and oriented, WD, WN, in no distress. HEENT: normal Neck: JVP normal. Carotid upstrokes normal. No thyromegaly. Lungs: equal expansion, clear bilaterally CV: Apex is discrete and nondisplaced, RRR with grade 3/6 harsh crescendo decrescendo murmur at the left sternal border and right upper sternal border Abd: soft, NT, +BS, no bruit, no hepatosplenomegaly Back: no CVA tenderness Ext: Trace pretibial edema bilaterally        Femoral pulses 2+= without bruits        DP/PT pulses intact and = Skin: warm and dry without rash Neuro: CNII-XII intact             Strength intact = bilaterally  Labs:   Lab Results  Component Value Date   WBC 5.2 05/31/2011   HGB 14.8 05/31/2011   HCT 42.6 05/31/2011   MCV 89.0 05/31/2011   PLT 141.0* 05/31/2011    Lab 05/31/11 1033  NA 141  K 4.0  CL 102  CO2 33*  BUN 18  CREATININE 0.8  CALCIUM 10.0  PROT --  BILITOT --  ALKPHOS --  ALT --  AST --  GLUCOSE 101*   No results found for this basename: CKTOTAL, CKMB, CKMBINDEX, TROPONINI    Lab Results  Component Value Date   CHOL 133 03/22/2011   CHOL 142 09/18/2010   CHOL 216* 03/16/2010   Lab Results  Component Value Date   HDL 50.40 03/22/2011   HDL 49.00 09/18/2010   HDL 13.08 03/16/2010   Lab Results  Component Value Date   LDLCALC 72 03/22/2011   LDLCALC 80 09/18/2010   LDLCALC 74 08/29/2009   Lab Results  Component Value Date   TRIG 53.0 03/22/2011   TRIG 63.0 09/18/2010   TRIG 119.0 03/16/2010   Lab Results  Component Value Date   CHOLHDL 3 03/22/2011   CHOLHDL 3 09/18/2010   CHOLHDL 4 03/16/2010    Lab Results  Component Value Date   LDLDIRECT 147.6 03/16/2010      Radiology: No results found.  ASSESSMENT AND PLAN: 78 year old woman with severe aortic stenosis. She presents for cardiac catheterization as outlined. Risks, indication, and alternatives were reviewed with the patient in detail. She will undergo right heart catheterization and coronary angiography with aortic root angiography. This was explained to the patient in detail. Her questions were answered.  Signed: [name] 06/07/2011, 8:23  AM    

## 2011-06-07 NOTE — OR Nursing (Signed)
Tegaderm dressing applied, site intact, level 0, bedrest begins at 0845

## 2011-06-07 NOTE — OR Nursing (Signed)
Discharge instructions reviewed and signed, pt stated understanding, ambulated in hall, site intact, level 0, transported to husband's car via wheelchair.

## 2011-06-07 NOTE — OR Nursing (Signed)
Meal served 

## 2011-06-07 NOTE — OR Nursing (Signed)
Dr Cooper at bedside to discuss results and treatment plan with pt and family 

## 2011-06-07 NOTE — Op Note (Signed)
Cardiac Catheterization Procedure Note  Name: [name] MRN: [id number] DOB: [birth year]  Procedure: Right Heart Cath, Selective Coronary Angiography, aortic root angiography  Indication: Symptomatic severe aortic stenosis   Procedural Details: The right groin was prepped, draped, and anesthetized with 1% lidocaine. Using the modified Seldinger technique a 4 French sheath was placed in the right femoral artery and a 6 French sheath was placed in the right femoral vein. A multipurpose catheter was used for the right heart catheterization. Standard protocol was followed for recording of right heart pressures and sampling of oxygen saturations. Fick cardiac output was calculated. Standard Judkins catheters were used for selective coronary angiography. A pigtail catheter was used for aortic root angiography and power injection was done with 30 cc at 15 cc per second. There were no immediate procedural complications. The patient was transferred to the post catheterization recovery area for further monitoring.  Procedural Findings: Hemodynamics RA 7 RV 33/9 PA 30/11 with a mean of 20 PCWP A wave 16, V-wave 15, mean of 12 LV not recorded as the aortic valve was not crossed AO 117/57  Oxygen saturations: PA 68 AO 93 SVC 69  Cardiac Output (Fick) 4.9  Cardiac Index (Fick) 2.5   Coronary angiography: Coronary dominance: right  Left mainstem: The left main origin is dilated there is no obstructive disease present.  Left anterior descending (LAD): Patent throughout. There is a large first diagonal with no stenosis. The LAD reaches the apex.  Left circumflex (LCx): Patent throughout with a small first obtuse marginal and a large second obtuse marginal. There is no significant obstructive disease.  Right coronary artery (RCA): Moderate caliber vessel, smooth throughout its course. Small distal branches. There is no significant obstructive disease.  Aortic root: The aortic root shows no  significant dilatation. The aortic valve is heavily calcified with restricted opening. There is no significant aortic insufficiency.  Final Conclusions:   1. Known severe aortic stenosis 2. No significant coronary artery disease 3. Essentially normal right heart pressures  Recommendations: Aortic valve replacement as scheduled next week with Dr. Laneta Simmers.  [name] 06/07/2011, 8:28 AM

## 2011-06-09 ENCOUNTER — Other Ambulatory Visit: Payer: Self-pay | Admitting: Cardiovascular Disease

## 2011-06-11 ENCOUNTER — Ambulatory Visit (HOSPITAL_COMMUNITY)
Admission: RE | Admit: 2011-06-11 | Discharge: 2011-06-11 | Disposition: A | Discharge status: Home / Self Care | Payer: Medicare Other | Source: Ambulatory Visit | Attending: Surgery | Admitting: Surgery

## 2011-06-11 ENCOUNTER — Inpatient Hospital Stay (HOSPITAL_COMMUNITY)
Admission: RE | Admit: 2011-06-11 | Discharge: 2011-06-11 | Disposition: A | Discharge status: Home / Self Care | Payer: Medicare Other | Source: Ambulatory Visit | Attending: Surgery | Admitting: Surgery

## 2011-06-11 ENCOUNTER — Other Ambulatory Visit (HOSPITAL_COMMUNITY): Payer: Self-pay | Admitting: Radiology

## 2011-06-11 ENCOUNTER — Other Ambulatory Visit: Payer: Self-pay

## 2011-06-11 ENCOUNTER — Encounter (HOSPITAL_COMMUNITY)
Admission: RE | Admit: 2011-06-11 | Discharge: 2011-06-11 | Disposition: A | Discharge status: Home / Self Care | Payer: Medicare Other | Source: Ambulatory Visit | Attending: Surgery | Admitting: Surgery

## 2011-06-11 ENCOUNTER — Encounter (HOSPITAL_COMMUNITY): Payer: Self-pay

## 2011-06-11 DIAGNOSIS — Z01810 Encounter for preprocedural cardiovascular examination: Secondary | ICD-10-CM

## 2011-06-11 DIAGNOSIS — Z01818 Encounter for other preprocedural examination: Secondary | ICD-10-CM | POA: Insufficient documentation

## 2011-06-11 DIAGNOSIS — I359 Nonrheumatic aortic valve disorder, unspecified: Secondary | ICD-10-CM | POA: Insufficient documentation

## 2011-06-11 DIAGNOSIS — Z01811 Encounter for preprocedural respiratory examination: Secondary | ICD-10-CM | POA: Diagnosis not present

## 2011-06-11 DIAGNOSIS — I517 Cardiomegaly: Secondary | ICD-10-CM | POA: Diagnosis not present

## 2011-06-11 DIAGNOSIS — I700 Atherosclerosis of aorta: Secondary | ICD-10-CM | POA: Insufficient documentation

## 2011-06-11 DIAGNOSIS — Z01812 Encounter for preprocedural laboratory examination: Secondary | ICD-10-CM | POA: Insufficient documentation

## 2011-06-11 DIAGNOSIS — Z87891 Personal history of nicotine dependence: Secondary | ICD-10-CM | POA: Insufficient documentation

## 2011-06-11 DIAGNOSIS — I10 Essential (primary) hypertension: Secondary | ICD-10-CM | POA: Insufficient documentation

## 2011-06-11 HISTORY — DX: Unspecified osteoarthritis, unspecified site: M19.90

## 2011-06-11 HISTORY — DX: Nonrheumatic aortic (valve) stenosis: I35.0

## 2011-06-11 HISTORY — DX: Malignant neoplasm of unspecified site of unspecified female breast: C50.919

## 2011-06-11 LAB — PROTIME-INR
INR: 1.02 (ref 0.00–1.49)
Prothrombin Time: 13.6 seconds (ref 11.6–15.2)

## 2011-06-11 LAB — COMPREHENSIVE METABOLIC PANEL
Albumin: 4.1 g/dL (ref 3.5–5.2)
BUN: 16 mg/dL (ref 6–23)
Calcium: 10.5 mg/dL (ref 8.4–10.5)
GFR calc Af Amer: >90 mL/min (ref >90)
Glucose, Bld: 89 mg/dL (ref 70–99)
Sodium: 140 mEq/L (ref 135–145)
Total Protein: 6.8 g/dL (ref 6.0–8.3)

## 2011-06-11 LAB — SURGICAL PCR SCREEN: MRSA, PCR: NEGATIVE

## 2011-06-11 LAB — URINALYSIS, ROUTINE W REFLEX MICROSCOPIC
Ketones, ur: NEGATIVE mg/dL
Nitrite: NEGATIVE
Protein, ur: NEGATIVE mg/dL
Specific Gravity, Urine: 1.005 (ref 1.005–1.030)
Urobilinogen, UA: 0.2 mg/dL (ref 0.0–1.0)
pH: 6 (ref 5.0–8.0)

## 2011-06-11 LAB — BLOOD GAS, ARTERIAL
Bicarbonate: 27.1 mEq/L — ABNORMAL HIGH (ref 20.0–24.0)
FIO2: 0.21 %
O2 Saturation: 96.3 %
Patient temperature: 98.6
pH, Arterial: 7.439 — ABNORMAL HIGH (ref 7.350–7.400)

## 2011-06-11 LAB — TYPE AND SCREEN: ABO/RH(D): A POS

## 2011-06-11 LAB — HEMOGLOBIN A1C: Hgb A1c MFr Bld: 5.6 % (ref <5.7)

## 2011-06-11 LAB — CBC
HCT: 40.9 % (ref 36.0–46.0)
Hemoglobin: 14.6 g/dL (ref 12.0–15.0)
MCH: 29.7 pg (ref 26.0–34.0)
MCHC: 35.7 g/dL (ref 30.0–36.0)
RDW: 13.1 % (ref 11.5–15.5)

## 2011-06-11 LAB — ABO/RH: ABO/RH(D): A POS

## 2011-06-11 LAB — APTT: aPTT: 33 seconds (ref 24–37)

## 2011-06-11 MED ORDER — CHLORHEXIDINE GLUCONATE 4 % EX LIQD: 30.0000 mL | CUTANEOUS | Status: DC

## 2011-06-11 MED ORDER — ALBUTEROL SULFATE (5 MG/ML) 0.5% IN NEBU
2.5000 mg | INHALATION_SOLUTION | Freq: Once | RESPIRATORY_TRACT | Status: AC
  Administered 2011-06-11: 2.5 mg via RESPIRATORY_TRACT

## 2011-06-11 NOTE — Progress Notes (Addendum)
Echo req from dr cooper from 6/12 by tarra.  Cath, from 05/31/11  office note from 12/12 in epic pft and dopplers scheduled today.

## 2011-06-11 NOTE — Pre-Procedure Instructions (Signed)
20 [name]  06/11/2011   Your procedure is scheduled on:  1.16.13  Report to [facility] [phone].  Call this number if you have problems the morning of surgery: [phone]   Remember:   Do not eat food:After Midnight.  May have clear liquids: up to 4 Hours before arrival.  Clear liquids include soda, tea, black coffee, apple or grape juice, broth.  Take these medicines the morning of surgery with A SIP OF WATER:coreg           Stop fish oil  Do not wear jewelry, make-up or nail polish.  Do not wear lotions, powders, or perfumes. You may wear deodorant.  Do not shave 48 hours prior to surgery.  Do not bring valuables to the hospital.  Contacts, dentures or bridgework may not be worn into surgery.  Leave suitcase in the car. After surgery it may be brought to your room.  For patients admitted to the hospital, checkout time is 11:00 AM the day of discharge.   Patients discharged the day of surgery will not be allowed to drive home.  Name and phone number of your driver: Casimiro Needle son [phone]  Special Instructions: Incentive Spirometry - Practice and bring it with you on the day of surgery. and CHG Shower Use Special Wash: 1/2 bottle night before surgery and 1/2 bottle morning of surgery.   Please read over the following fact sheets that you were given: Pain Booklet, Coughing and Deep Breathing, Blood Transfusion Information, Open Heart Packet, MRSA Information and Surgical Site Infection Prevention

## 2011-06-12 ENCOUNTER — Other Ambulatory Visit: Payer: Self-pay

## 2011-06-12 DIAGNOSIS — I359 Nonrheumatic aortic valve disorder, unspecified: Secondary | ICD-10-CM

## 2011-06-12 MED ORDER — NITROGLYCERIN IN D5W 200-5 MCG/ML-% IV SOLN
2.0000 ug/min | INTRAVENOUS | Status: AC
  Administered 2011-06-13: 5 ug/kg/min via INTRAVENOUS
  Filled 2011-06-12: qty 250

## 2011-06-12 MED ORDER — DEXTROSE 5 % IV SOLN
1.5000 g | INTRAVENOUS | Status: AC
  Administered 2011-06-13: 750 g via INTRAVENOUS
  Administered 2011-06-13: 1.5 g via INTRAVENOUS
  Filled 2011-06-12: qty 1.5

## 2011-06-12 MED ORDER — SODIUM CHLORIDE 0.9 % IV SOLN
INTRAVENOUS | Status: AC
  Administered 2011-06-13: 70 mL/h via INTRAVENOUS
  Filled 2011-06-12: qty 40

## 2011-06-12 MED ORDER — EPINEPHRINE HCL 1 MG/ML IJ SOLN
0.5000 ug/min | INTRAMUSCULAR | Status: DC
  Filled 2011-06-12: qty 4

## 2011-06-12 MED ORDER — DOPAMINE-DEXTROSE 3.2-5 MG/ML-% IV SOLN
2.0000 ug/kg/min | INTRAVENOUS | Status: DC
  Filled 2011-06-12: qty 250

## 2011-06-12 MED ORDER — VERAPAMIL HCL 2.5 MG/ML IV SOLN
INTRAVENOUS | Status: DC
  Filled 2011-06-12: qty 2.5

## 2011-06-12 MED ORDER — SODIUM CHLORIDE 0.9 % IV SOLN
0.1000 ug/kg/h | INTRAVENOUS | Status: AC
  Administered 2011-06-13: .2 ug/kg/h via INTRAVENOUS
  Filled 2011-06-12: qty 4

## 2011-06-12 MED ORDER — DEXTROSE 5 % IV SOLN
750.0000 mg | INTRAVENOUS | Status: DC
  Filled 2011-06-12: qty 750

## 2011-06-12 MED ORDER — POTASSIUM CHLORIDE 2 MEQ/ML IV SOLN
80.0000 meq | INTRAVENOUS | Status: DC
  Filled 2011-06-12: qty 40

## 2011-06-12 MED ORDER — MAGNESIUM SULFATE 50 % IJ SOLN
40.0000 meq | INTRAMUSCULAR | Status: DC
  Filled 2011-06-12: qty 10

## 2011-06-12 MED ORDER — PHENYLEPHRINE HCL 10 MG/ML IJ SOLN
30.0000 ug/min | INTRAVENOUS | Status: DC
  Filled 2011-06-12: qty 2

## 2011-06-12 MED ORDER — VANCOMYCIN HCL 1000 MG IV SOLR
1250.0000 mg | INTRAVENOUS | Status: AC
  Administered 2011-06-13: 1250 mg via INTRAVENOUS
  Filled 2011-06-12: qty 1250

## 2011-06-12 MED ORDER — SODIUM CHLORIDE 0.9 % IV SOLN
INTRAVENOUS | Status: AC
  Administered 2011-06-13: 2.4 [IU]/h via INTRAVENOUS
  Filled 2011-06-12: qty 1

## 2011-06-12 MED ORDER — METOPROLOL TARTRATE 12.5 MG HALF TABLET: 12.5000 mg | ORAL_TABLET | Freq: Once | ORAL | Status: DC

## 2011-06-12 NOTE — H&P (Signed)
[address]            [name] [address]          [phone]      PCP is [name], MD, MD Referring Provider is [name], MD              Chief complaint:  Shortness of breath due to severe aortic stenosis     HPI:   The patient is a 78 year old active woman with a known history of severe aortic stenosis who presents with a several month history of exertional dyspnea particularly with walking. She also reports significant fatigue. She has still been going to a gym and working out 3 days per week which she says she tolerates fairly well. She doesn't really do any walking at the gym but more of a weight training program. Her most recent echocardiogram in June of 2012 reportedly showed her peak aortic valve gradient increased to 77 mmHg and a mean gradient had increased to 50 mm mercury. Aortic valve area was 0.7 cm. She's not had a cardiac catheterization yet.        Past Medical History   Diagnosis  Date   .  Unspecified constipation     .  Blood in stool     .  Family history of colonic polyps     .  Other abnormal glucose     .  Pain in limb     .  Edema     .  Malignant neoplasm of corpus uteri, except isthmus     .  Pain in joint, shoulder region     .  Malignant neoplasm of corpus uteri, except isthmus     .  Unspecified hearing loss     .  Other symptoms involving cardiovascular system  12/1999       Echo, normal. Transthoracic echo LV function: vigorous  10/2005   .  Aortic valve disorders  04/2005       carotid dopplers- mild plague bilateral/ aortic stenosis   .  Alcohol abuse, unspecified     .  Dyskinesia of esophagus     .  Diaphragmatic hernia without mention of obstruction or gangrene     .  Tobacco use disorder     .  Internal hemorrhoids without mention of complication     .  External hemorrhoids without mention of complication     .  Disorder of bone and cartilage, unspecified     .  Diverticulosis of colon  (without mention of hemorrhage)     .  Postmenopausal bleeding     .  Unspecified essential hypertension     .  Other and unspecified hyperlipidemia         Past Surgical History   Procedure  Date   .  Rotator cuff repair  05/2008       right   .  Breast biopsy  5/11       DCIS   .  Breast lumpectomy  11/2009       DCIS, neg sentinel lymph node biopsy   .  Laparoscopy w/ total bilateral pelvic and peri-aortic lymphadenectomy  11/04/2007   .  Total abdominal hysterectomy w/ bilateral salpingoophorectomy  11/04/07       salpingo-oophrectomy   .  Cataract extraction         Family History  Problem  Relation  Age of Onset   .  Colon polyps  Sister         2010   .  Cancer  Maternal Aunt         possible post menopausal breast cancer   .  Heart disease  Mother     .  Stroke  Father        Social History History   Substance Use Topics   .  Smoking status:  Former Smoker       Quit date:  05/29/1999   .  Smokeless tobacco:  Not on file   .  Alcohol Use:  No       Current Outpatient Prescriptions   Medication  Sig  Dispense  Refill   .  aspirin 325 MG tablet  Take 325 mg by mouth. Take 1/2 tablet daily          .  carvedilol (COREG) 25 MG tablet  TAKE 1 TABLET TWICE A DAY   180 tablet   1   .  Cholecalciferol (VITAMIN D) 1000 UNITS capsule  Take 1,000 Units by mouth 2 (two) times daily.           Marland Kitchen  guaiFENesin (MUCINEX) 600 MG 12 hr tablet  Take 1,200 mg by mouth 2 (two) times daily.           .  hydrochlorothiazide (HYDRODIURIL) 25 MG tablet  TAKE 1 TABLET DAILY   90 tablet   2   .  Omega-3 Fatty Acids (FISH OIL) 1000 MG CAPS  Take by mouth 2 (two) times daily.           .  potassium chloride SA (K-DUR,KLOR-CON) 20 MEQ tablet  Take 20 mEq by mouth 2 (two) times daily.               Allergies   Allergen  Reactions   .  Calcium         REACTION: constipation   .  Ezetimibe         REACTION: fatigue   .  Ramipril         REACTION: abdominal pain   .  Simvastatin          REACTION: leg pain and body aches   .  Valsartan         REACTION: abdominal pain      Review of Systems:   Gen.: She denies any fever or chills. She's had no recent weight changes. She does report exertional fatigue. Eyes: Negative ENT: Negative. She does see a dentist regularly. Endocrine: She denies diabetes and hypothyroidism. Cardiac: She denies any chest pain or pressure. She denies PND and orthopnea. She has had bilateral lower extremity edema which has been present for a few years. She denies palpitations. Respiratory: She denies cough and sputum production. GI: She denies any nausea or vomiting. She's had no melena or bright blood per rectum. GU: She denies dysuria and hematuria. Neurological: She denies any focal weakness or numbness. She denies dizziness and syncope. She's never had a TIA or stroke.     LMP 05/29/2007 Physical Exam:   BP 156/88  Pulse 72  Resp 16  Ht 5\' 5"  (1.651 m)  Wt 197 lb (89.359 kg)  BMI 32.78 kg/m2  SpO2 95%  LMP 05/29/2007   She is a well-developed, well-nourished white female in no distress. HEENT: Normocephalic and atraumatic. Pupils are equal and reactive to light and accommodation. Extraocular muscles  are intact. Oropharynx is clear. Neck: Carotid pulses are palpable bilaterally. There is a transmitted murmur to both sides of her neck. There is no adenopathy or thyromegaly. Lungs: Clear Heart: Regular rate and rhythm with a grade 3/6 harsh systolic murmur over the aorta. Abdomen: Bowel sounds are positive. Abdomen soft nontender. There are no palpable masses or organomegaly. Extremities: There is mild to moderate bilateral lower extremity edema to the knees. Pedal pulses are palpable bilaterally. Neurological: Alert and oriented x3. Motor and sensory exams are grossly normal.   Diagnostic Tests: --------------------------------------------------------------------  Transthoracic Echocardiography   Patient:    [name], [name]  MR  #:       [id number]  Study Date: 07/28/2009  Gender:     F  Age:        89  Height:     167.6cm  Weight:     87.5kg  BSA:        1.50m^2  Pt. Status:  Room:    ADMITTING    [name], MD   ATTENDING    [name], MD   ORDERING     [name], MD   SONOGRAPHER  Aida Raider   PERFORMING   [facility]  cc:   --------------------------------------------------------------------  Indications:   424.1 Aortic valve disorders.   --------------------------------------------------------------------  History:  PMH: History of AS. History of lower extremity edema. Risk  factors: Hypertension. Dyslipidemia.   --------------------------------------------------------------------  Study Conclusions   - Left ventricle: The cavity size was normal. Wall thickness was    increased in a pattern of mild LVH. Systolic function was normal.    The estimated ejection fraction was in the range of 60% to 65%.    Features are consistent with a pseudonormal left ventricular    filling pattern, with concomitant abnormal relaxation and    increased filling pressure (grade 2 diastolic dysfunction).  - Pericardium, extracardiac: A trivial pericardial effusion was    identified.  Transthoracic echocardiography. M-mode, complete 2D, spectral  Doppler, and color Doppler. Height: Height: 167.6cm. Height: 66in.  Weight: Weight: 87.5kg. Weight: 192.6lb. Body mass index: BMI:  31.2kg/m^2. Body surface area: BSA: 1.60m^2. Blood pressure: 122/80.  Patient status: Outpatient. Location: [place] [facility]   --------------------------------------------------------------------   --------------------------------------------------------------------  Left ventricle: The cavity size was normal. Wall thickness was  increased in a pattern of mild LVH. Systolic function was normal.  The estimated ejection fraction was in the range of 60% to 65%.  Features are consistent with a pseudonormal left  ventricular filling  pattern, with concomitant abnormal relaxation and increased filling  pressure (grade 2 diastolic dysfunction).   --------------------------------------------------------------------  Aortic valve: AV is thickened, calcified with restricted motion.  Peak and mean gradients through the valve are 66 and 42 mm Hg  respectively consistent with moderately severe to severeAS. Doppler:  No regurgitation.  VTI ratio of LVOT to aortic valve: 0.25. Valve  area: 0.71cm^2(VTI). Indexed valve area: 0.36cm^2/m^2 (VTI). Valve  area: 0.63cm^2 (Vmax). Indexed valve area: 0.32cm^2/m^2 (Vmax).  Mean gradient: 44mm Hg (S). Peak gradient: 71mm Hg (S).   --------------------------------------------------------------------  Mitral valve: Structurally normal valve. Leaflet separation was  normal. Doppler: Transvalvular velocity was within the normal range.  There was no evidence for stenosis. No regurgitation.  Peak  gradient: 4mm Hg (D).   --------------------------------------------------------------------  Left atrium: The atrium was normal in size.   --------------------------------------------------------------------  Right ventricle: The cavity size was normal. Wall thickness was  normal. Systolic function was normal.   --------------------------------------------------------------------  Pulmonic valve: Structurally normal valve. Cusp separation was  normal. Doppler: Transvalvular velocity was within the normal range.  Mild regurgitation.   --------------------------------------------------------------------  Right atrium: The atrium was normal in size.   --------------------------------------------------------------------  Pericardium: A trivial pericardial effusion was identified.   --------------------------------------------------------------------  Systemic veins:  Inferior vena cava: The vessel was normal in size; the respirophasic  diameter changes were in the normal  range (= 50%); findings are  consistent with normal central venous pressure.   --------------------------------------------------------------------   2D measurements            Normal  Doppler measurements      Normal  Left ventricle                     Main pulmonary artery  LVID ED,         37 mm     43-52   Pressure, S   19 mm Hg    =30  chord, PLAX                        LVOT  LVID ES,         23 mm     23-38   VTI, S       28. cm       ------  chord, PLAX                                       8  FS, chord,       38 %      >29     Stroke vol   81. ml       ------  PLAX                                              7  LVPW, ED         12 mm     ------  Stroke index 41. ml/m^2   ------  IVS/LVPW          1        <1.3                   4  ratio, ED                          Aortic valve  Ventricular septum                 Peak vel, S  421 cm/s     ------  IVS, ED          12 mm     ------  Mean vel, S  311 cm/s     ------  LVOT                               VTI, S       115 cm       ------  Diam             19 mm     ------  Mean          44 mm Hg    ------  Aorta                              gradient, S  Root diam, ED    26 mm     ------  Peak          71 mm Hg    ------  Left atrium                        gradient, S  AP dim           36 mm     ------  VTI ratio    0.2          ------  AP dim index   1.83 cm/m^2 <2.2    LVOT/AV        5                                     Area, VTI    0.7 cm^2     ------                                                    1                                     Area index   0.3 cm^2/m^2 ------                                     (VTI)          6                                     Area, Vmax   0.6 cm^2     ------                                                    3                                     Area index   0.3 cm^2/m^2 ------                                     (Vmax)         2                                     Mitral valve  Peak E vel   97. cm/s     ------                                                    6                                     Peak A vel   89. cm/s     ------                                                    9                                     Deceleration 275 ms       150-23                                     time                      0                                     Peak           4 mm Hg    ------                                     gradient, D                                     Peak E/A     1.1          ------                                     ratio                                     Tricuspid valve                                     Regurg peak  187 cm/s     ------                                     vel  Peak RV-RA    14 mm Hg    ------                                     gradient, S                                     Systemic veins                                     Estimated      5 mm Hg    ------                                     CVP                                     Right ventricle                                     Pressure, S   19 mm Hg    <30   --------------------------------------------------------------------  Prepared and Electronically Authenticated by   Dietrich Pates, MD  [id number]:[id number]     Impression:   She has severe aortic stenosis which is becoming symptomatic and limiting her active lifestyle. I agree it is time to proceed with aortic valve replacement. She will require cardiac catheterization preoperatively. I would plan to use a tissue valve given her age. I discussed the pros and cons of mechanical and tissue valves with her and my recommendation for a tissue valve. She is in agreement with that. I discussed the operative procedure with the patient and family including alternatives, benefits and risks; including but not limited to bleeding, blood transfusion, infection, stroke,  myocardial infarction, heart block requiring a permanent pacemaker, organ dysfunction, and death.  [name] understands and agrees to proceed.  Cardiac cath shows no coronary disease and normal right heart pressures.   Plan:   Aortic valve replacement.

## 2011-06-12 NOTE — Progress Notes (Signed)
Spoke with Revonda Standard from Dr. Sharee Pimple office x2 concerning we need prep orders for pt. She stated they would be in by tomorrow.

## 2011-06-13 ENCOUNTER — Encounter (HOSPITAL_COMMUNITY)
Admission: RE | Disposition: A | Discharge status: Home / Self Care | Payer: Self-pay | Source: Ambulatory Visit | Attending: Surgery

## 2011-06-13 ENCOUNTER — Encounter (HOSPITAL_COMMUNITY): Payer: Self-pay | Admitting: *Deleted

## 2011-06-13 ENCOUNTER — Inpatient Hospital Stay (HOSPITAL_COMMUNITY)
Admission: RE | Admit: 2011-06-13 | Discharge: 2011-06-19 | DRG: 220 | Disposition: A | Discharge status: Home / Self Care | Payer: Medicare Other | Source: Ambulatory Visit | Attending: Surgery | Admitting: Surgery

## 2011-06-13 ENCOUNTER — Other Ambulatory Visit: Payer: Self-pay | Admitting: Surgery

## 2011-06-13 ENCOUNTER — Encounter (HOSPITAL_COMMUNITY): Payer: Self-pay | Admitting: Anesthesiology

## 2011-06-13 ENCOUNTER — Inpatient Hospital Stay (HOSPITAL_COMMUNITY): Payer: Medicare Other

## 2011-06-13 ENCOUNTER — Ambulatory Visit (HOSPITAL_COMMUNITY): Payer: Medicare Other | Admitting: Anesthesiology

## 2011-06-13 DIAGNOSIS — Z8542 Personal history of malignant neoplasm of other parts of uterus: Secondary | ICD-10-CM

## 2011-06-13 DIAGNOSIS — R5381 Other malaise: Secondary | ICD-10-CM | POA: Diagnosis present

## 2011-06-13 DIAGNOSIS — Z9079 Acquired absence of other genital organ(s): Secondary | ICD-10-CM | POA: Diagnosis not present

## 2011-06-13 DIAGNOSIS — Z09 Encounter for follow-up examination after completed treatment for conditions other than malignant neoplasm: Secondary | ICD-10-CM | POA: Diagnosis not present

## 2011-06-13 DIAGNOSIS — Z954 Presence of other heart-valve replacement: Secondary | ICD-10-CM | POA: Diagnosis not present

## 2011-06-13 DIAGNOSIS — Z780 Asymptomatic menopausal state: Secondary | ICD-10-CM

## 2011-06-13 DIAGNOSIS — I700 Atherosclerosis of aorta: Secondary | ICD-10-CM | POA: Diagnosis not present

## 2011-06-13 DIAGNOSIS — Z803 Family history of malignant neoplasm of breast: Secondary | ICD-10-CM

## 2011-06-13 DIAGNOSIS — E785 Hyperlipidemia, unspecified: Secondary | ICD-10-CM | POA: Diagnosis present

## 2011-06-13 DIAGNOSIS — Z7982 Long term (current) use of aspirin: Secondary | ICD-10-CM | POA: Diagnosis not present

## 2011-06-13 DIAGNOSIS — J90 Pleural effusion, not elsewhere classified: Secondary | ICD-10-CM | POA: Diagnosis not present

## 2011-06-13 DIAGNOSIS — R0602 Shortness of breath: Secondary | ICD-10-CM | POA: Diagnosis not present

## 2011-06-13 DIAGNOSIS — Z8371 Family history of colonic polyps: Secondary | ICD-10-CM

## 2011-06-13 DIAGNOSIS — D62 Acute posthemorrhagic anemia: Secondary | ICD-10-CM | POA: Diagnosis not present

## 2011-06-13 DIAGNOSIS — Z83719 Family history of colon polyps, unspecified: Secondary | ICD-10-CM

## 2011-06-13 DIAGNOSIS — Z8719 Personal history of other diseases of the digestive system: Secondary | ICD-10-CM

## 2011-06-13 DIAGNOSIS — N950 Postmenopausal bleeding: Secondary | ICD-10-CM | POA: Diagnosis not present

## 2011-06-13 DIAGNOSIS — I10 Essential (primary) hypertension: Secondary | ICD-10-CM | POA: Diagnosis not present

## 2011-06-13 DIAGNOSIS — I517 Cardiomegaly: Secondary | ICD-10-CM | POA: Diagnosis not present

## 2011-06-13 DIAGNOSIS — Z90710 Acquired absence of both cervix and uterus: Secondary | ICD-10-CM

## 2011-06-13 DIAGNOSIS — E559 Vitamin D deficiency, unspecified: Secondary | ICD-10-CM | POA: Diagnosis present

## 2011-06-13 DIAGNOSIS — H9190 Unspecified hearing loss, unspecified ear: Secondary | ICD-10-CM | POA: Diagnosis present

## 2011-06-13 DIAGNOSIS — Z888 Allergy status to other drugs, medicaments and biological substances status: Secondary | ICD-10-CM

## 2011-06-13 DIAGNOSIS — I359 Nonrheumatic aortic valve disorder, unspecified: Principal | ICD-10-CM

## 2011-06-13 DIAGNOSIS — J9819 Other pulmonary collapse: Secondary | ICD-10-CM | POA: Diagnosis not present

## 2011-06-13 DIAGNOSIS — Z87891 Personal history of nicotine dependence: Secondary | ICD-10-CM | POA: Diagnosis not present

## 2011-06-13 DIAGNOSIS — R7309 Other abnormal glucose: Secondary | ICD-10-CM | POA: Diagnosis present

## 2011-06-13 DIAGNOSIS — K5730 Diverticulosis of large intestine without perforation or abscess without bleeding: Secondary | ICD-10-CM | POA: Diagnosis not present

## 2011-06-13 DIAGNOSIS — J984 Other disorders of lung: Secondary | ICD-10-CM | POA: Diagnosis not present

## 2011-06-13 DIAGNOSIS — R5383 Other fatigue: Secondary | ICD-10-CM | POA: Diagnosis present

## 2011-06-13 HISTORY — PX: AORTIC VALVE REPLACEMENT: SHX41

## 2011-06-13 LAB — POCT I-STAT, CHEM 8
BUN: 14 mg/dL (ref 6–23)
Calcium, Ion: 1.16 mmol/L (ref 1.12–1.32)
Chloride: 108 mEq/L (ref 96–112)
HCT: 30 % — ABNORMAL LOW (ref 36.0–46.0)
Sodium: 143 mEq/L (ref 135–145)
TCO2: 23 mmol/L (ref 0–100)

## 2011-06-13 LAB — POCT I-STAT 3, ART BLOOD GAS (G3+)
Acid-Base Excess: 3 mmol/L — ABNORMAL HIGH (ref 0.0–2.0)
Acid-base deficit: 1 mmol/L (ref 0.0–2.0)
O2 Saturation: 100 %
O2 Saturation: 99 %
Patient temperature: 35.1
Patient temperature: 37.2
TCO2: 28 mmol/L (ref 0–100)
TCO2: 24 mmol/L (ref 0–100)
TCO2: 24 mmol/L (ref 0–100)
pCO2 arterial: 46.4 mmHg — ABNORMAL HIGH (ref 35.0–45.0)
pCO2 arterial: 44.4 mmHg (ref 35.0–45.0)
pH, Arterial: 7.316 — ABNORMAL LOW (ref 7.350–7.400)
pO2, Arterial: 523 mmHg — ABNORMAL HIGH (ref 80.0–100.0)

## 2011-06-13 LAB — CBC
Hemoglobin: 11 g/dL — ABNORMAL LOW (ref 12.0–15.0)
MCH: 29.6 pg (ref 26.0–34.0)
MCH: 29.7 pg (ref 26.0–34.0)
MCHC: 35.3 g/dL (ref 30.0–36.0)
Platelets: 81 10*3/uL — ABNORMAL LOW (ref 150–400)
Platelets: 91 10*3/uL — ABNORMAL LOW (ref 150–400)
RBC: 3.7 MIL/uL — ABNORMAL LOW (ref 3.87–5.11)
RDW: 13 % (ref 11.5–15.5)
WBC: 10.2 10*3/uL (ref 4.0–10.5)

## 2011-06-13 LAB — BLOOD GAS, ARTERIAL
Drawn by: 35135
O2 Content: 4 L/min
pCO2 arterial: 45.7 mmHg — ABNORMAL HIGH (ref 35.0–45.0)
pH, Arterial: 7.337 — ABNORMAL LOW (ref 7.350–7.400)
pO2, Arterial: 79.2 mmHg — ABNORMAL LOW (ref 80.0–100.0)

## 2011-06-13 LAB — PROTIME-INR: Prothrombin Time: 17.4 seconds — ABNORMAL HIGH (ref 11.6–15.2)

## 2011-06-13 LAB — POCT I-STAT 4, (NA,K, GLUC, HGB,HCT)
Glucose, Bld: 139 mg/dL — ABNORMAL HIGH (ref 70–99)
Glucose, Bld: 104 mg/dL — ABNORMAL HIGH (ref 70–99)
Glucose, Bld: 121 mg/dL — ABNORMAL HIGH (ref 70–99)
Glucose, Bld: 137 mg/dL — ABNORMAL HIGH (ref 70–99)
HCT: 35 % — ABNORMAL LOW (ref 36.0–46.0)
HCT: 26 % — ABNORMAL LOW (ref 36.0–46.0)
HCT: 33 % — ABNORMAL LOW (ref 36.0–46.0)
Hemoglobin: 11.9 g/dL — ABNORMAL LOW (ref 12.0–15.0)
Hemoglobin: 11.2 g/dL — ABNORMAL LOW (ref 12.0–15.0)
Hemoglobin: 10.2 g/dL — ABNORMAL LOW (ref 12.0–15.0)
Potassium: 3.4 mEq/L — ABNORMAL LOW (ref 3.5–5.1)
Potassium: 3.4 mEq/L — ABNORMAL LOW (ref 3.5–5.1)
Potassium: 3.2 mEq/L — ABNORMAL LOW (ref 3.5–5.1)
Sodium: 142 mEq/L (ref 135–145)
Sodium: 140 mEq/L (ref 135–145)
Sodium: 139 mEq/L (ref 135–145)

## 2011-06-13 LAB — MAGNESIUM: Magnesium: 2.8 mg/dL — ABNORMAL HIGH (ref 1.5–2.5)

## 2011-06-13 LAB — GLUCOSE, CAPILLARY
Glucose-Capillary: 108 mg/dL — ABNORMAL HIGH (ref 70–99)
Glucose-Capillary: 107 mg/dL — ABNORMAL HIGH (ref 70–99)
Glucose-Capillary: 112 mg/dL — ABNORMAL HIGH (ref 70–99)
Glucose-Capillary: 130 mg/dL — ABNORMAL HIGH (ref 70–99)

## 2011-06-13 LAB — CREATININE, SERUM
GFR calc Af Amer: >90 mL/min (ref >90)
GFR calc non Af Amer: 87 mL/min — ABNORMAL LOW (ref >90)

## 2011-06-13 SURGERY — REPLACEMENT, AORTIC VALVE, OPEN
Anesthesia: General | Wound class: Clean

## 2011-06-13 MED ORDER — ASPIRIN EC 325 MG PO TBEC
325.0000 mg | DELAYED_RELEASE_TABLET | Freq: Every day | ORAL | Status: DC
  Administered 2011-06-14 – 2011-06-15 (×2): 325 mg via ORAL
  Filled 2011-06-13 (×2): qty 1

## 2011-06-13 MED ORDER — POTASSIUM CHLORIDE 10 MEQ/50ML IV SOLN
10.0000 meq | INTRAVENOUS | Status: AC
  Administered 2011-06-13 (×3): 10 meq via INTRAVENOUS

## 2011-06-13 MED ORDER — OXYCODONE HCL 5 MG PO TABS
5.0000 mg | ORAL_TABLET | ORAL | Status: DC | PRN
  Administered 2011-06-14 (×2): 5 mg via ORAL
  Filled 2011-06-13 (×2): qty 1

## 2011-06-13 MED ORDER — POTASSIUM CHLORIDE 10 MEQ/50ML IV SOLN
10.0000 meq | INTRAVENOUS | Status: AC
  Administered 2011-06-13 (×2): 10 meq via INTRAVENOUS

## 2011-06-13 MED ORDER — METOPROLOL TARTRATE 25 MG/10 ML ORAL SUSPENSION
12.5000 mg | Freq: Two times a day (BID) | ORAL | Status: DC
  Filled 2011-06-13 (×3): qty 5

## 2011-06-13 MED ORDER — LACTATED RINGERS IV SOLN: 500.0000 mL | Freq: Once | INTRAVENOUS | Status: AC | PRN

## 2011-06-13 MED ORDER — NITROGLYCERIN IN D5W 200-5 MCG/ML-% IV SOLN: 0.0000 ug/min | INTRAVENOUS | Status: DC

## 2011-06-13 MED ORDER — DOCUSATE SODIUM 100 MG PO CAPS
200.0000 mg | ORAL_CAPSULE | Freq: Every day | ORAL | Status: DC
  Administered 2011-06-14 – 2011-06-19 (×6): 200 mg via ORAL
  Filled 2011-06-13 (×6): qty 2

## 2011-06-13 MED ORDER — ACETAMINOPHEN 160 MG/5ML PO SOLN: 650.0000 mg | ORAL | Status: AC

## 2011-06-13 MED ORDER — METOPROLOL TARTRATE 1 MG/ML IV SOLN: 2.5000 mg | INTRAVENOUS | Status: DC | PRN

## 2011-06-13 MED ORDER — ACETAMINOPHEN 500 MG PO TABS
1000.0000 mg | ORAL_TABLET | Freq: Four times a day (QID) | ORAL | Status: DC
  Administered 2011-06-14 (×2): 1000 mg via ORAL
  Filled 2011-06-13 (×8): qty 2

## 2011-06-13 MED ORDER — BISACODYL 5 MG PO TBEC
10.0000 mg | DELAYED_RELEASE_TABLET | Freq: Every day | ORAL | Status: DC
  Administered 2011-06-14: 10 mg via ORAL
  Filled 2011-06-13: qty 2

## 2011-06-13 MED ORDER — SODIUM CHLORIDE 0.9 % IV SOLN: 250.0000 mL | INTRAVENOUS | Status: DC

## 2011-06-13 MED ORDER — SODIUM CHLORIDE 0.9 % IV SOLN
INTRAVENOUS | Status: DC
  Filled 2011-06-13: qty 1

## 2011-06-13 MED ORDER — INSULIN REGULAR BOLUS VIA INFUSION
0.0000 [IU] | Freq: Three times a day (TID) | INTRAVENOUS | Status: DC
  Filled 2011-06-13 (×8): qty 10

## 2011-06-13 MED ORDER — SODIUM CHLORIDE 0.9 % IV SOLN
10.0000 mg | INTRAVENOUS | Status: DC | PRN
  Administered 2011-06-13: 10 ug/min via INTRAVENOUS

## 2011-06-13 MED ORDER — ROCURONIUM BROMIDE 100 MG/10ML IV SOLN
INTRAVENOUS | Status: DC | PRN
  Administered 2011-06-13: 50 mg via INTRAVENOUS
  Administered 2011-06-13 (×2): 30 mg via INTRAVENOUS

## 2011-06-13 MED ORDER — MIDAZOLAM HCL 5 MG/5ML IJ SOLN
INTRAMUSCULAR | Status: DC | PRN
  Administered 2011-06-13: 5 mg via INTRAVENOUS
  Administered 2011-06-13: 1 mg via INTRAVENOUS

## 2011-06-13 MED ORDER — ALBUMIN HUMAN 5 % IV SOLN
250.0000 mL | INTRAVENOUS | Status: AC | PRN
  Administered 2011-06-13 (×3): 250 mL via INTRAVENOUS
  Filled 2011-06-13: qty 250

## 2011-06-13 MED ORDER — INSULIN ASPART 100 UNIT/ML ~~LOC~~ SOLN
0.0000 [IU] | SUBCUTANEOUS | Status: DC
  Administered 2011-06-14 (×3): 2 [IU] via SUBCUTANEOUS

## 2011-06-13 MED ORDER — PHENYLEPHRINE HCL 10 MG/ML IJ SOLN
0.0000 ug/min | INTRAVENOUS | Status: DC
  Filled 2011-06-13: qty 2

## 2011-06-13 MED ORDER — PANTOPRAZOLE SODIUM 40 MG PO TBEC
40.0000 mg | DELAYED_RELEASE_TABLET | Freq: Every day | ORAL | Status: DC
  Administered 2011-06-15 – 2011-06-18 (×4): 40 mg via ORAL
  Filled 2011-06-13 (×4): qty 1

## 2011-06-13 MED ORDER — SODIUM CHLORIDE 0.9 % IJ SOLN: 3.0000 mL | INTRAMUSCULAR | Status: DC | PRN

## 2011-06-13 MED ORDER — ACETAMINOPHEN 650 MG RE SUPP
650.0000 mg | RECTAL | Status: AC
  Administered 2011-06-13: 650 mg via RECTAL

## 2011-06-13 MED ORDER — HEPARIN SODIUM (PORCINE) 1000 UNIT/ML IJ SOLN
INTRAMUSCULAR | Status: DC | PRN
  Administered 2011-06-13: 25000 [IU] via INTRAVENOUS

## 2011-06-13 MED ORDER — LACTATED RINGERS IV SOLN
INTRAVENOUS | Status: DC | PRN
  Administered 2011-06-13: 08:00:00 via INTRAVENOUS

## 2011-06-13 MED ORDER — SODIUM CHLORIDE 0.45 % IV SOLN
INTRAVENOUS | Status: DC
Start: 2011-06-13 — End: 2011-06-15
  Administered 2011-06-13: 14:00:00 via INTRAVENOUS

## 2011-06-13 MED ORDER — FAMOTIDINE IN NACL 20-0.9 MG/50ML-% IV SOLN
20.0000 mg | Freq: Two times a day (BID) | INTRAVENOUS | Status: DC
  Administered 2011-06-13: 20 mg via INTRAVENOUS

## 2011-06-13 MED ORDER — SODIUM CHLORIDE 0.9 % IJ SOLN
3.0000 mL | Freq: Two times a day (BID) | INTRAMUSCULAR | Status: DC
  Administered 2011-06-14: 10:00:00 via INTRAVENOUS
  Administered 2011-06-15: 3 mL via INTRAVENOUS

## 2011-06-13 MED ORDER — DEXTROSE 5 % IV SOLN
INTRAVENOUS | Status: DC | PRN
  Administered 2011-06-13 (×2): via INTRAVENOUS

## 2011-06-13 MED ORDER — THROMBIN 20000 UNITS EX KIT
PACK | OROMUCOSAL | Status: DC | PRN
  Administered 2011-06-13: 10:00:00 via TOPICAL

## 2011-06-13 MED ORDER — HEMOSTATIC AGENTS (NO CHARGE) OPTIME
TOPICAL | Status: DC | PRN
  Administered 2011-06-13: 1 via TOPICAL

## 2011-06-13 MED ORDER — MORPHINE SULFATE 4 MG/ML IJ SOLN
2.0000 mg | INTRAMUSCULAR | Status: DC | PRN
  Administered 2011-06-13: 2 mg via INTRAVENOUS
  Administered 2011-06-13: 4 mg via INTRAVENOUS
  Administered 2011-06-13 – 2011-06-14 (×2): 2 mg via INTRAVENOUS
  Filled 2011-06-13 (×3): qty 1

## 2011-06-13 MED ORDER — DEXTROSE 5 % IV SOLN
1.5000 g | Freq: Two times a day (BID) | INTRAVENOUS | Status: AC
  Administered 2011-06-13 – 2011-06-14 (×4): 1.5 g via INTRAVENOUS
  Filled 2011-06-13 (×4): qty 1.5

## 2011-06-13 MED ORDER — MAGNESIUM SULFATE 40 MG/ML IJ SOLN
4.0000 g | Freq: Once | INTRAMUSCULAR | Status: AC
  Administered 2011-06-13: 4 g via INTRAVENOUS
  Filled 2011-06-13: qty 100

## 2011-06-13 MED ORDER — PROTAMINE SULFATE 10 MG/ML IV SOLN
INTRAVENOUS | Status: DC | PRN
  Administered 2011-06-13: 250 mg via INTRAVENOUS

## 2011-06-13 MED ORDER — INSULIN ASPART 100 UNIT/ML ~~LOC~~ SOLN
0.0000 [IU] | SUBCUTANEOUS | Status: AC
  Administered 2011-06-13 (×2): 2 [IU] via SUBCUTANEOUS
  Filled 2011-06-13: qty 3

## 2011-06-13 MED ORDER — VANCOMYCIN HCL 1000 MG IV SOLR
1000.0000 mg | Freq: Once | INTRAVENOUS | Status: AC
  Administered 2011-06-13: 1000 mg via INTRAVENOUS
  Filled 2011-06-13: qty 1000

## 2011-06-13 MED ORDER — 0.9 % SODIUM CHLORIDE (POUR BTL) OPTIME
TOPICAL | Status: DC | PRN
  Administered 2011-06-13: 1000 mL

## 2011-06-13 MED ORDER — ACETAMINOPHEN 160 MG/5ML PO SOLN
975.0000 mg | Freq: Four times a day (QID) | ORAL | Status: DC
  Filled 2011-06-13: qty 40.6

## 2011-06-13 MED ORDER — SUFENTANIL CITRATE 50 MCG/ML IV SOLN
INTRAVENOUS | Status: DC | PRN
  Administered 2011-06-13: 20 ug via INTRAVENOUS
  Administered 2011-06-13: 90 ug via INTRAVENOUS
  Administered 2011-06-13: 10 ug via INTRAVENOUS
  Administered 2011-06-13: 5 ug via INTRAVENOUS
  Administered 2011-06-13: 30 ug via INTRAVENOUS
  Administered 2011-06-13: 5 ug via INTRAVENOUS

## 2011-06-13 MED ORDER — SODIUM CHLORIDE 0.9 % IV SOLN
INTRAVENOUS | Status: DC | PRN
  Administered 2011-06-13: 08:00:00 via INTRAVENOUS

## 2011-06-13 MED ORDER — METOPROLOL TARTRATE 12.5 MG HALF TABLET
12.5000 mg | ORAL_TABLET | Freq: Two times a day (BID) | ORAL | Status: DC
  Filled 2011-06-13 (×3): qty 1

## 2011-06-13 MED ORDER — PROPOFOL 10 MG/ML IV EMUL
INTRAVENOUS | Status: DC | PRN
  Administered 2011-06-13: 60 mg via INTRAVENOUS

## 2011-06-13 MED ORDER — MORPHINE SULFATE 2 MG/ML IJ SOLN: 1.0000 mg | INTRAMUSCULAR | Status: AC | PRN

## 2011-06-13 MED ORDER — HEMOSTATIC AGENTS (NO CHARGE) OPTIME
TOPICAL | Status: DC | PRN
  Administered 2011-06-13: 2 via TOPICAL

## 2011-06-13 MED ORDER — ONDANSETRON HCL 4 MG/2ML IJ SOLN
4.0000 mg | Freq: Four times a day (QID) | INTRAMUSCULAR | Status: DC | PRN
  Administered 2011-06-13 – 2011-06-14 (×2): 4 mg via INTRAVENOUS
  Filled 2011-06-13 (×2): qty 2

## 2011-06-13 MED ORDER — BISACODYL 10 MG RE SUPP: 10.0000 mg | Freq: Every day | RECTAL | Status: DC

## 2011-06-13 MED ORDER — SODIUM CHLORIDE 0.9 % IV SOLN: INTRAVENOUS | Status: DC

## 2011-06-13 MED ORDER — LACTATED RINGERS IV SOLN
INTRAVENOUS | Status: DC
  Administered 2011-06-13: 14:00:00 via INTRAVENOUS

## 2011-06-13 MED ORDER — ASPIRIN 81 MG PO CHEW: 324.0000 mg | CHEWABLE_TABLET | Freq: Every day | ORAL | Status: DC

## 2011-06-13 MED ORDER — MIDAZOLAM HCL 2 MG/2ML IJ SOLN: 2.0000 mg | INTRAMUSCULAR | Status: DC | PRN

## 2011-06-13 MED ORDER — DEXMEDETOMIDINE HCL 100 MCG/ML IV SOLN
0.1000 ug/kg/h | INTRAVENOUS | Status: DC
  Filled 2011-06-13: qty 2

## 2011-06-13 SURGICAL SUPPLY — 83 items
ADAPTER CARDIO PERF ANTE/RETRO (ADAPTER) ×2 IMPLANT
ADPR PRFSN 84XANTGRD RTRGD (ADAPTER) ×1
ATTRACTOMAT 16X20 MAGNETIC DRP (DRAPES) ×2 IMPLANT
BAG DECANTER FOR FLEXI CONT (MISCELLANEOUS) ×2 IMPLANT
BLADE SAW STERNAL (BLADE) ×2 IMPLANT
BLADE SURG 15 STRL LF DISP TIS (BLADE) ×1 IMPLANT
BLADE SURG 15 STRL SS (BLADE) ×2
CANISTER SUCTION 2500CC (MISCELLANEOUS) ×2 IMPLANT
CANNULA GUNDRY RCSP 15FR (MISCELLANEOUS) ×2 IMPLANT
CATH ROBINSON RED A/P 18FR (CATHETERS) ×7 IMPLANT
CATH THORACIC 36FR (CATHETERS) ×2 IMPLANT
CATH THORACIC 36FR RT ANG (CATHETERS) ×2 IMPLANT
CLOTH BEACON ORANGE TIMEOUT ST (SAFETY) ×2 IMPLANT
CONT SPEC 4OZ CLIKSEAL STRL BL (MISCELLANEOUS) ×1 IMPLANT
CONT SPEC STER OR (MISCELLANEOUS) ×2 IMPLANT
COVER MAYO STAND STRL (DRAPES) ×1 IMPLANT
COVER SURGICAL LIGHT HANDLE (MISCELLANEOUS) ×5 IMPLANT
CRADLE DONUT ADULT HEAD (MISCELLANEOUS) ×2 IMPLANT
DRAPE PROXIMA HALF (DRAPES) ×2 IMPLANT
DRAPE SLUSH/WARMER DISC (DRAPES) ×2 IMPLANT
DRSG COVADERM 4X14 (GAUZE/BANDAGES/DRESSINGS) ×2 IMPLANT
ELECT CAUTERY BLADE 6.4 (BLADE) ×2 IMPLANT
ELECT REM PT RETURN 9FT ADLT (ELECTROSURGICAL) ×4
ELECTRODE REM PT RTRN 9FT ADLT (ELECTROSURGICAL) ×2 IMPLANT
GLOVE BIO SURGEON STRL SZ 6 (GLOVE) ×2 IMPLANT
GLOVE BIO SURGEON STRL SZ 6.5 (GLOVE) ×4 IMPLANT
GLOVE BIO SURGEON STRL SZ7 (GLOVE) IMPLANT
GLOVE BIO SURGEON STRL SZ7.5 (GLOVE) ×8 IMPLANT
GLOVE BIOGEL PI IND STRL 6 (GLOVE) IMPLANT
GLOVE BIOGEL PI IND STRL 6.5 (GLOVE) IMPLANT
GLOVE BIOGEL PI IND STRL 8 (GLOVE) IMPLANT
GLOVE BIOGEL PI IND STRL 8.5 (GLOVE) IMPLANT
GLOVE BIOGEL PI INDICATOR 6 (GLOVE) ×1
GLOVE BIOGEL PI INDICATOR 6.5 (GLOVE) ×1
GLOVE BIOGEL PI INDICATOR 8 (GLOVE) ×1
GLOVE BIOGEL PI INDICATOR 8.5 (GLOVE) ×1
GLOVE EUDERMIC 7 POWDERFREE (GLOVE) ×4 IMPLANT
GLOVE EXAM NITRILE XS STR PU (GLOVE) ×2 IMPLANT
GLOVE SURG EUDERMIC 8 LTX PF (GLOVE) ×1 IMPLANT
GOWN PREVENTION PLUS XLARGE (GOWN DISPOSABLE) ×3 IMPLANT
GOWN STRL NON-REIN LRG LVL3 (GOWN DISPOSABLE) ×9 IMPLANT
HEART VENT LT CURVED (MISCELLANEOUS) ×2 IMPLANT
HEMOSTAT POWDER SURGIFOAM 1G (HEMOSTASIS) ×5 IMPLANT
HEMOSTAT SURGICEL 2X14 (HEMOSTASIS) ×2 IMPLANT
INSERT FOGARTY XLG (MISCELLANEOUS) IMPLANT
KIT BASIN OR (CUSTOM PROCEDURE TRAY) ×2 IMPLANT
KIT CATH CPB BARTLE (MISCELLANEOUS) ×2 IMPLANT
KIT ROOM TURNOVER OR (KITS) ×2 IMPLANT
KIT SUCTION CATH 14FR (SUCTIONS) ×2 IMPLANT
LINE VENT (MISCELLANEOUS) ×1 IMPLANT
NS IRRIG 1000ML POUR BTL (IV SOLUTION) ×14 IMPLANT
PACK OPEN HEART (CUSTOM PROCEDURE TRAY) ×2 IMPLANT
PAD ARMBOARD 7.5X6 YLW CONV (MISCELLANEOUS) ×4 IMPLANT
SET CARDIOPLEGIA MPS 5001102 (MISCELLANEOUS) ×1 IMPLANT
SPONGE GAUZE 4X4 12PLY (GAUZE/BANDAGES/DRESSINGS) ×3 IMPLANT
SPONGE LAP 18X18 X RAY DECT (DISPOSABLE) ×1 IMPLANT
SPONGE LAP 4X18 X RAY DECT (DISPOSABLE) ×2 IMPLANT
SUT BONE WAX W31G (SUTURE) ×2 IMPLANT
SUT ETHIBON 2 0 V 52N 30 (SUTURE) ×4 IMPLANT
SUT ETHIBON EXCEL 2-0 V-5 (SUTURE) IMPLANT
SUT ETHIBOND 2 0 SH (SUTURE) ×2
SUT ETHIBOND 2 0 SH 36X2 (SUTURE) ×1 IMPLANT
SUT ETHIBOND 2 0 V4 (SUTURE) IMPLANT
SUT ETHIBOND 2 0V4 GREEN (SUTURE) IMPLANT
SUT ETHIBOND 4 0 RB 1 (SUTURE) IMPLANT
SUT ETHIBOND V-5 VALVE (SUTURE) IMPLANT
SUT PROLENE 3 0 SH 1 (SUTURE) ×2 IMPLANT
SUT PROLENE 3 0 SH DA (SUTURE) IMPLANT
SUT PROLENE 4 0 RB 1 (SUTURE) ×8
SUT PROLENE 4-0 RB1 .5 CRCL 36 (SUTURE) ×3 IMPLANT
SUT STEEL 6MS V (SUTURE) IMPLANT
SUT STEEL SZ 6 DBL 3X14 BALL (SUTURE) ×3 IMPLANT
SUT VIC AB 1 CTX 36 (SUTURE) ×4
SUT VIC AB 1 CTX36XBRD ANBCTR (SUTURE) ×2 IMPLANT
SUTURE E-PAK OPEN HEART (SUTURE) ×2 IMPLANT
SYSTEM SAHARA CHEST DRAIN ATS (WOUND CARE) ×2 IMPLANT
TOWEL OR 17X24 6PK STRL BLUE (TOWEL DISPOSABLE) ×4 IMPLANT
TOWEL OR 17X26 10 PK STRL BLUE (TOWEL DISPOSABLE) ×5 IMPLANT
TRAY FOLEY IC TEMP SENS 14FR (CATHETERS) ×2 IMPLANT
TUBE SUCT INTRACARD DLP 20F (MISCELLANEOUS) ×2 IMPLANT
UNDERPAD 30X30 INCONTINENT (UNDERPADS AND DIAPERS) ×2 IMPLANT
VALVE MAGNA EASE 21MM (Prosthesis & Implant Heart) ×1 IMPLANT
WATER STERILE IRR 1000ML POUR (IV SOLUTION) ×4 IMPLANT

## 2011-06-13 NOTE — Anesthesia Postprocedure Evaluation (Signed)
  Anesthesia Post-op Note  Patient: [name]  Procedure(s) Performed:  AORTIC VALVE REPLACEMENT (AVR)  Patient Location: PACU  Anesthesia Type: General  Level of Consciousness: sedated  Airway and Oxygen Therapy: Patient remains intubated per anesthesia plan  Post-op Pain: none  Post-op Assessment: Post-op Vital signs reviewed  Post-op Vital Signs: stable  Complications: No apparent anesthesia complications

## 2011-06-13 NOTE — OR Nursing (Signed)
06/13/11 Patient came back with dentures in denture cup labeled with patient sticker.  Sent with patient to unit 2300.

## 2011-06-13 NOTE — Interval H&P Note (Signed)
History and Physical Interval Note:  06/13/2011 8:18 AM  [name]  has presented today for surgery, with the diagnosis of Aortic Stenosis  The various methods of treatment have been discussed with the patient and family. After consideration of risks, benefits and other options for treatment, the patient has consented to  Procedure(s): AORTIC VALVE REPLACEMENT (AVR) as a surgical intervention .  The patients' history has been reviewed, patient examined, no change in status, stable for surgery.  I have reviewed the patients' chart and labs.  Questions were answered to the patient's satisfaction.     [name]

## 2011-06-13 NOTE — Transfer of Care (Signed)
Immediate Anesthesia Transfer of Care Note  Patient: [name]  Procedure(s) Performed:  AORTIC VALVE REPLACEMENT (AVR)  Patient Location: PACU and SICU  Anesthesia Type: General  Level of Consciousness: Patient remains intubated per anesthesia plan  Airway & Oxygen Therapy: Patient remains intubated per anesthesia plan  Post-op Assessment: Report given to PACU RN and Post -op Vital signs reviewed and stable  Post vital signs: Reviewed and stable Filed Vitals:   06/13/11 1328  BP: 96/42  Pulse: 83  Temp:   Resp: 13    Complications: No apparent anesthesia complications

## 2011-06-13 NOTE — Brief Op Note (Signed)
06/13/2011  1:16 PM  PATIENT:  [name]  78 y.o. female  PRE-OPERATIVE DIAGNOSIS:  Aortic Stenosis  POST-OPERATIVE DIAGNOSIS:  Aortic Stenosis  PROCEDURE:  Procedure(s): AORTIC VALVE REPLACEMENT (AVR) 21mm Edwards Pericardial Magna-Ease  SURGEON:  Surgeon(s): [name], MD  PHYSICIAN ASSISTANT: none  ASSISTANTS: Al Corpus, SA   ANESTHESIA:   general  EBL:  Total I/O In: 3340 [I.V.:2600; Blood:740] Out: 1100 [Urine:1100]  BLOOD ADMINISTERED:none   PATIENT DISPOSITION:  ICU - intubated and hemodynamically stable.

## 2011-06-13 NOTE — Procedures (Signed)
Extubation Procedure Note  Patient Details:   Name: [name] DOB: [birth year] MRN: [id number]   Airway Documentation:   Pt extubated to 4L [place] at 1815. No stridor noted. BBS dim. Pt able to vocalize name.  Evaluation  O2 sats: stable throughout and currently acceptable Complications: No apparent complications Patient did tolerate procedure well. Bilateral Breath Sounds: Diminished     Christie Beckers 06/13/2011, 6:21 PM

## 2011-06-13 NOTE — Progress Notes (Signed)
Patient examined and record reviewed.Hemodynamics stable,labs satisfactory.Patient is stable after AVR to- day.Continue current care. VAN TRIGT III,PETER 06/13/2011

## 2011-06-13 NOTE — OR Nursing (Signed)
12:15 Called volunteer desk to inform patient family off pump, 12:15 Called 2300 for first call, 12:45 Called 2300 for Second call.

## 2011-06-13 NOTE — Anesthesia Preprocedure Evaluation (Addendum)
Anesthesia Evaluation  Patient identified by MRN, date of birth, ID band Patient awake    Reviewed: Allergy & Precautions, H&P , NPO status , Patient's Chart, lab work & pertinent test results  Airway Mallampati: II TM Distance: >3 FB Neck ROM: Full  Mouth opening: Limited Mouth Opening  Dental  (+) Edentulous Upper and Partial Lower   Pulmonary former smoker   Pulmonary exam normal       Cardiovascular hypertension, Pt. on medications     Neuro/Psych Negative Neurological ROS     GI/Hepatic Neg liver ROS,   Endo/Other  Negative Endocrine ROS  Renal/GU negative Renal ROS     Musculoskeletal   Abdominal Normal abdominal exam  (+)   Peds  Hematology negative hematology ROS (+)   Anesthesia Other Findings   Reproductive/Obstetrics                           Anesthesia Physical Anesthesia Plan  ASA: III  Anesthesia Plan: General   Post-op Pain Management:    Induction: Intravenous  Airway Management Planned: Oral ETT  Additional Equipment: Arterial line, CVP, PA Cath and TEE  Intra-op Plan:   Post-operative Plan: Post-operative intubation/ventilation  Informed Consent: I have reviewed the patients History and Physical, chart, labs and discussed the procedure including the risks, benefits and alternatives for the proposed anesthesia with the patient or authorized representative who has indicated his/her understanding and acceptance.   Dental advisory given  Plan Discussed with: CRNA and Surgeon  Anesthesia Plan Comments:        Anesthesia Quick Evaluation

## 2011-06-13 NOTE — Progress Notes (Signed)
*  PRELIMINARY RESULTS* Echocardiogram Echocardiogram Transesophageal has been performed.  Glean Salen [facility] 06/13/2011, 10:10 AM

## 2011-06-14 ENCOUNTER — Encounter (HOSPITAL_COMMUNITY): Payer: Self-pay | Admitting: Surgery

## 2011-06-14 ENCOUNTER — Inpatient Hospital Stay (HOSPITAL_COMMUNITY): Payer: Medicare Other

## 2011-06-14 DIAGNOSIS — I359 Nonrheumatic aortic valve disorder, unspecified: Secondary | ICD-10-CM | POA: Diagnosis not present

## 2011-06-14 DIAGNOSIS — J9819 Other pulmonary collapse: Secondary | ICD-10-CM | POA: Diagnosis not present

## 2011-06-14 DIAGNOSIS — J90 Pleural effusion, not elsewhere classified: Secondary | ICD-10-CM | POA: Diagnosis not present

## 2011-06-14 DIAGNOSIS — J984 Other disorders of lung: Secondary | ICD-10-CM | POA: Diagnosis not present

## 2011-06-14 LAB — POCT I-STAT 3, ART BLOOD GAS (G3+)
pCO2 arterial: 43.7 mmHg (ref 35.0–45.0)
pH, Arterial: 7.34 — ABNORMAL LOW (ref 7.350–7.400)
pO2, Arterial: 112 mmHg — ABNORMAL HIGH (ref 80.0–100.0)

## 2011-06-14 LAB — MAGNESIUM
Magnesium: 2.6 mg/dL — ABNORMAL HIGH (ref 1.5–2.5)
Magnesium: 2.2 mg/dL (ref 1.5–2.5)

## 2011-06-14 LAB — BASIC METABOLIC PANEL
BUN: 16 mg/dL (ref 6–23)
Chloride: 109 mEq/L (ref 96–112)
GFR calc Af Amer: >90 mL/min (ref >90)
GFR calc non Af Amer: 87 mL/min — ABNORMAL LOW (ref >90)
Potassium: 3.9 mEq/L (ref 3.5–5.1)
Sodium: 141 mEq/L (ref 135–145)

## 2011-06-14 LAB — CBC
HCT: 29.8 % — ABNORMAL LOW (ref 36.0–46.0)
HCT: 30.7 % — ABNORMAL LOW (ref 36.0–46.0)
Hemoglobin: 10.7 g/dL — ABNORMAL LOW (ref 12.0–15.0)
MCV: 85.8 fL (ref 78.0–100.0)
RDW: 13.5 % (ref 11.5–15.5)
WBC: 8.8 10*3/uL (ref 4.0–10.5)
WBC: 8.3 10*3/uL (ref 4.0–10.5)

## 2011-06-14 LAB — GLUCOSE, CAPILLARY
Glucose-Capillary: 139 mg/dL — ABNORMAL HIGH (ref 70–99)
Glucose-Capillary: 157 mg/dL — ABNORMAL HIGH (ref 70–99)

## 2011-06-14 LAB — CREATININE, SERUM
GFR calc Af Amer: >90 mL/min (ref >90)
GFR calc non Af Amer: 89 mL/min — ABNORMAL LOW (ref >90)

## 2011-06-14 LAB — POCT I-STAT, CHEM 8
BUN: 15 mg/dL (ref 6–23)
Calcium, Ion: 1.13 mmol/L (ref 1.12–1.32)
Creatinine, Ser: 0.7 mg/dL (ref 0.50–1.10)
TCO2: 27 mmol/L (ref 0–100)

## 2011-06-14 MED ORDER — CARVEDILOL 6.25 MG PO TABS
6.2500 mg | ORAL_TABLET | Freq: Two times a day (BID) | ORAL | Status: DC
  Administered 2011-06-14 – 2011-06-15 (×3): 6.25 mg via ORAL
  Filled 2011-06-14 (×7): qty 1

## 2011-06-14 MED ORDER — POTASSIUM CHLORIDE CRYS ER 20 MEQ PO TBCR
40.0000 meq | EXTENDED_RELEASE_TABLET | Freq: Once | ORAL | Status: DC
  Filled 2011-06-14: qty 2

## 2011-06-14 MED ORDER — POTASSIUM CHLORIDE 20 MEQ/15ML (10%) PO LIQD
40.0000 meq | Freq: Once | ORAL | Status: AC
  Administered 2011-06-14: 40 meq via ORAL
  Filled 2011-06-14: qty 30

## 2011-06-14 MED ORDER — FUROSEMIDE 10 MG/ML IJ SOLN
40.0000 mg | Freq: Once | INTRAMUSCULAR | Status: AC
  Administered 2011-06-14: 40 mg via INTRAVENOUS
  Filled 2011-06-14: qty 4

## 2011-06-14 MED ORDER — INSULIN GLARGINE 100 UNIT/ML ~~LOC~~ SOLN
10.0000 [IU] | Freq: Every day | SUBCUTANEOUS | Status: DC
  Administered 2011-06-14 – 2011-06-16 (×3): 10 [IU] via SUBCUTANEOUS
  Filled 2011-06-14: qty 3

## 2011-06-14 MED ORDER — INSULIN ASPART 100 UNIT/ML ~~LOC~~ SOLN
0.0000 [IU] | SUBCUTANEOUS | Status: DC
  Administered 2011-06-14 – 2011-06-15 (×5): 2 [IU] via SUBCUTANEOUS
  Filled 2011-06-14: qty 3

## 2011-06-14 MED FILL — Heparin Sodium (Porcine) Inj 1000 Unit/ML: INTRAMUSCULAR | Qty: 2.5 | Status: AC

## 2011-06-14 MED FILL — Magnesium Sulfate Inj 50%: INTRAMUSCULAR | Qty: 10 | Status: AC

## 2011-06-14 MED FILL — Nitroglycerin IV Soln 5 MG/ML: INTRAVENOUS | Qty: 0.84 | Status: AC

## 2011-06-14 MED FILL — Lactated Ringer's Solution: INTRAVENOUS | Qty: 500 | Status: AC

## 2011-06-14 MED FILL — Potassium Chloride Inj 2 mEq/ML: INTRAVENOUS | Qty: 40 | Status: AC

## 2011-06-14 MED FILL — Sodium Bicarbonate IV Soln 8.4%: INTRAVENOUS | Qty: 0.3 | Status: AC

## 2011-06-14 MED FILL — Verapamil HCl IV Soln 2.5 MG/ML: INTRAVENOUS | Qty: 3.3 | Status: AC

## 2011-06-14 NOTE — Progress Notes (Signed)
1 Day Post-Op Procedure(s) (LRB): AORTIC VALVE REPLACEMENT (AVR) (N/A) Subjective: No complaints  Objective: Vital signs in last 24 hours: Temp:  [95 F (35 C)-100.2 F (37.9 C)] 99.1 F (37.3 C) (01/17 0743) Pulse Rate:  [44-181] 87  (01/17 0600) Cardiac Rhythm:  [-] Atrial paced;Other (Comment) (01/17 0400) Resp:  [3-23] 20  (01/17 0700) BP: (89-129)/(38-63) 120/45 mmHg (01/17 0700) SpO2:  [82 %-100 %] 99 % (01/17 0500) Arterial Line BP: (69-133)/(33-63) 133/37 mmHg (01/17 0700) FiO2 (%):  [40 %-50 %] 40.4 % (01/16 1815) Weight:  [88 kg (194 lb 0.1 oz)-92 kg (202 lb 13.2 oz)] 92 kg (202 lb 13.2 oz) (01/17 0500)  Hemodynamic parameters for last 24 hours: PAP: (16-40)/(6-30) 19/7 mmHg CO:  [3.2 L/min-5.6 L/min] 5.6 L/min CI:  [1.3 L/min/m2-2.9 L/min/m2] 2.9 L/min/m2  Intake/Output from previous day: 01/16 0701 - 01/17 0700 In: 5917.7 [P.O.:120; I.V.:3785.7; Blood:740; NG/GT:20; IV Piggyback:1252] Out: 4060 [Urine:2130; Emesis/NG output:100; Blood:1400; Chest Tube:430] Intake/Output this shift:    General appearance: alert and cooperative Neurologic: intact Heart: regular rate and rhythm, S1, S2 normal, no murmur, click, rub or gallop Lungs: clear to auscultation bilaterally Extremities: mild edema  Wound: dressing dry  Lab Results:  Basename 06/14/11 0400 06/13/11 2034 06/13/11 2000  WBC 8.8 -- 10.2  HGB 10.4* 10.2* --  HCT 29.8* 30.0* --  PLT 86* -- 91*   BMET:  Basename 06/14/11 0400 06/13/11 2034 06/11/11 1209  NA 141 143 --  K 3.9 4.2 --  CL 109 108 --  CO2 25 -- 25  GLUCOSE 144* 141* --  BUN 16 14 --  CREATININE 0.61 0.70 --  CALCIUM 7.9* -- 10.5    PT/INR:  Basename 06/13/11 1333  LABPROT 17.4*  INR 1.40   ABG    Component Value Date/Time   PHART 7.316* 06/13/2011 2006   HCO3 22.6 06/13/2011 2006   TCO2 23 06/13/2011 2034   ACIDBASEDEF 3.0* 06/13/2011 2006   O2SAT 97.0 06/13/2011 2006   CBG (last 3)   Basename 06/14/11 0402 06/13/11 2349  06/13/11 2240  GLUCAP 140* 133* 130*    Assessment/Plan: S/P Procedure(s) (LRB): AORTIC VALVE REPLACEMENT (AVR) (N/A) Mobilize Diuresis Diabetes control d/c tubes/lines See progression orders   LOS: 1 day    [name] K 06/14/2011

## 2011-06-14 NOTE — Progress Notes (Signed)
Patient ID: [name], female   DOB: 1934/05/10, 78 y.o.   MRN: [id number]                   [address]            [place] [address]          [phone]     BP 137/54  Pulse 85  Temp(Src) 98.3 F (36.8 C) (Oral)  Resp 21  Ht 5\' 5"  (1.651 m)  Wt 202 lb 13.2 oz (92 kg)  BMI 33.75 kg/m2  SpO2 97%  LMP 05/29/2007  Stable day Lab Results  Component Value Date   WBC 8.3 06/14/2011   HGB 10.7* 06/14/2011   HCT 30.7* 06/14/2011   PLT 82* 06/14/2011   GLUCOSE 159* 06/14/2011   CHOL 133 03/22/2011   TRIG 53.0 03/22/2011   HDL 50.40 03/22/2011   LDLDIRECT 147.6 03/16/2010   LDLCALC 72 03/22/2011   ALT 26 06/11/2011   AST 22 06/11/2011   NA 136 06/14/2011   K 4.5 06/14/2011   CL 100 06/14/2011   CREATININE 0.56 06/14/2011   BUN 15 06/14/2011   CO2 25 06/14/2011   TSH 1.79 03/16/2010   INR 1.40 06/13/2011   HGBA1C 5.6 06/11/2011   Plst low Delight Ovens MD  Beeper [phone] Office [phone]

## 2011-06-14 NOTE — Progress Notes (Signed)
UR Completed.  Analys Ryden Jane [phone] 06/14/2011  

## 2011-06-14 NOTE — Clinical Documentation Improvement (Signed)
GENERIC DOCUMENTATION CLARIFICATION QUERY  THIS DOCUMENT IS NOT A PERMANENT PART OF THE MEDICAL RECORD  TO RESPOND TO THE THIS QUERY, FOLLOW THE INSTRUCTIONS BELOW:  1. If needed, update documentation for the patient's encounter via the notes activity.  2. Access this query again and click edit on the In [employer].  3. After updating, or not, click F2 to complete all highlighted (required) fields concerning your review. Select "additional documentation in the medical record" OR "no additional documentation provided".  4. Click Sign note button.  5. The deficiency will fall out of your In Basket *Please let us know if you are not able to complete this workflow by phone or e-mail (listed below).  Please update your documentation within the medical record to reflect your response to this query.                                                                                        06/14/11   Dear [facility],  In a better effort to capture your patient's severity of illness, reflect appropriate length of stay and utilization of resources, a review of the patient medical record has revealed the following indicators.    Based on your clinical judgment, please clarify and document in a progress note and/or discharge summary the clinical condition associated with the following supporting information:   Possible Clinical Conditions?  _______Atelectasis   _______Pleural Effusion  _______Other Condition  _______Cannot Clinically Determine   Supporting Information:  Diagnostics: CXR results 1/17:  Developing Left base atelectasis and possible small Left pleural effusion.   You may use possible, probable, or suspect with inpatient documentation. possible, probable, suspected diagnoses MUST be documented at the time of discharge  Reviewed: Reviewed and answered in the progress notes on 06/18/11.  Thank You,  Marciano Sequin,  Clinical Documentation  Specialist:  Pager: [phone]  Health Information Management [place]

## 2011-06-15 ENCOUNTER — Inpatient Hospital Stay (HOSPITAL_COMMUNITY): Payer: Medicare Other

## 2011-06-15 DIAGNOSIS — Z09 Encounter for follow-up examination after completed treatment for conditions other than malignant neoplasm: Secondary | ICD-10-CM | POA: Diagnosis not present

## 2011-06-15 LAB — BASIC METABOLIC PANEL
BUN: 13 mg/dL (ref 6–23)
Calcium: 7.9 mg/dL — ABNORMAL LOW (ref 8.4–10.5)
Creatinine, Ser: 0.57 mg/dL (ref 0.50–1.10)
GFR calc non Af Amer: 88 mL/min — ABNORMAL LOW (ref >90)
Glucose, Bld: 130 mg/dL — ABNORMAL HIGH (ref 70–99)

## 2011-06-15 LAB — CBC
MCH: 29.5 pg (ref 26.0–34.0)
MCHC: 34.7 g/dL (ref 30.0–36.0)
Platelets: 70 10*3/uL — ABNORMAL LOW (ref 150–400)

## 2011-06-15 LAB — GLUCOSE, CAPILLARY
Glucose-Capillary: 121 mg/dL — ABNORMAL HIGH (ref 70–99)
Glucose-Capillary: 119 mg/dL — ABNORMAL HIGH (ref 70–99)
Glucose-Capillary: 136 mg/dL — ABNORMAL HIGH (ref 70–99)

## 2011-06-15 MED ORDER — INSULIN ASPART 100 UNIT/ML ~~LOC~~ SOLN
0.0000 [IU] | Freq: Three times a day (TID) | SUBCUTANEOUS | Status: DC
  Administered 2011-06-15 – 2011-06-18 (×2): 2 [IU] via SUBCUTANEOUS

## 2011-06-15 MED ORDER — SODIUM CHLORIDE 0.9 % IJ SOLN
3.0000 mL | Freq: Two times a day (BID) | INTRAMUSCULAR | Status: DC
  Administered 2011-06-15 – 2011-06-18 (×5): 3 mL via INTRAVENOUS

## 2011-06-15 MED ORDER — POTASSIUM CHLORIDE 20 MEQ/15ML (10%) PO LIQD
ORAL | Status: AC
  Filled 2011-06-15: qty 30

## 2011-06-15 MED ORDER — SODIUM CHLORIDE 0.9 % IV SOLN: 250.0000 mL | INTRAVENOUS | Status: DC | PRN

## 2011-06-15 MED ORDER — ONDANSETRON HCL 4 MG PO TABS: 4.0000 mg | ORAL_TABLET | Freq: Four times a day (QID) | ORAL | Status: DC | PRN

## 2011-06-15 MED ORDER — POTASSIUM CHLORIDE 10 MEQ/50ML IV SOLN
10.0000 meq | INTRAVENOUS | Status: DC | PRN
  Administered 2011-06-15: 10 meq via INTRAVENOUS
  Filled 2011-06-15: qty 50
  Filled 2011-06-15: qty 100
  Filled 2011-06-15: qty 50

## 2011-06-15 MED ORDER — BISACODYL 5 MG PO TBEC: 10.0000 mg | DELAYED_RELEASE_TABLET | Freq: Every day | ORAL | Status: DC | PRN

## 2011-06-15 MED ORDER — FUROSEMIDE 40 MG PO TABS
40.0000 mg | ORAL_TABLET | Freq: Every day | ORAL | Status: DC
  Administered 2011-06-15 – 2011-06-17 (×3): 40 mg via ORAL
  Filled 2011-06-15 (×4): qty 1

## 2011-06-15 MED ORDER — ACETAMINOPHEN 325 MG PO TABS: 650.0000 mg | ORAL_TABLET | Freq: Four times a day (QID) | ORAL | Status: DC | PRN

## 2011-06-15 MED ORDER — MOVING RIGHT ALONG BOOK
Freq: Once | Status: DC
  Filled 2011-06-15: qty 1

## 2011-06-15 MED ORDER — POTASSIUM CHLORIDE CRYS ER 20 MEQ PO TBCR
40.0000 meq | EXTENDED_RELEASE_TABLET | Freq: Once | ORAL | Status: AC
  Administered 2011-06-15: 40 meq via ORAL
  Filled 2011-06-15: qty 2

## 2011-06-15 MED ORDER — POTASSIUM CHLORIDE CRYS ER 20 MEQ PO TBCR
20.0000 meq | EXTENDED_RELEASE_TABLET | Freq: Two times a day (BID) | ORAL | Status: DC
  Administered 2011-06-15 – 2011-06-17 (×5): 20 meq via ORAL
  Filled 2011-06-15 (×7): qty 1

## 2011-06-15 MED ORDER — ONDANSETRON HCL 4 MG/2ML IJ SOLN: 4.0000 mg | Freq: Four times a day (QID) | INTRAMUSCULAR | Status: DC | PRN

## 2011-06-15 MED ORDER — TRAMADOL HCL 50 MG PO TABS: 50.0000 mg | ORAL_TABLET | ORAL | Status: DC | PRN

## 2011-06-15 MED ORDER — CARVEDILOL 12.5 MG PO TABS
12.5000 mg | ORAL_TABLET | Freq: Two times a day (BID) | ORAL | Status: DC
  Administered 2011-06-15 – 2011-06-19 (×8): 12.5 mg via ORAL
  Filled 2011-06-15 (×10): qty 1

## 2011-06-15 MED ORDER — BISACODYL 10 MG RE SUPP: 10.0000 mg | Freq: Every day | RECTAL | Status: DC | PRN

## 2011-06-15 MED ORDER — OXYCODONE HCL 5 MG PO TABS: 5.0000 mg | ORAL_TABLET | ORAL | Status: DC | PRN

## 2011-06-15 MED ORDER — ASPIRIN EC 325 MG PO TBEC
325.0000 mg | DELAYED_RELEASE_TABLET | Freq: Every day | ORAL | Status: DC
  Administered 2011-06-16 – 2011-06-19 (×4): 325 mg via ORAL
  Filled 2011-06-15 (×4): qty 1

## 2011-06-15 MED ORDER — ACETAMINOPHEN 160 MG/5ML PO SOLN
650.0000 mg | Freq: Four times a day (QID) | ORAL | Status: DC | PRN
  Filled 2011-06-15: qty 20.3

## 2011-06-15 MED ORDER — SODIUM CHLORIDE 0.9 % IJ SOLN
3.0000 mL | INTRAMUSCULAR | Status: DC | PRN
  Administered 2011-06-16 – 2011-06-17 (×2): 3 mL via INTRAVENOUS

## 2011-06-15 MED FILL — Sodium Chloride IV Soln 0.9%: INTRAVENOUS | Qty: 1000 | Status: AC

## 2011-06-15 MED FILL — Lidocaine HCl IV Inj 20 MG/ML: INTRAVENOUS | Qty: 5 | Status: AC

## 2011-06-15 MED FILL — Mannitol IV Soln 20%: INTRAVENOUS | Qty: 500 | Status: AC

## 2011-06-15 MED FILL — Sodium Bicarbonate IV Soln 8.4%: INTRAVENOUS | Qty: 50 | Status: AC

## 2011-06-15 MED FILL — Heparin Sodium (Porcine) Inj 1000 Unit/ML: INTRAMUSCULAR | Qty: 10 | Status: AC

## 2011-06-15 MED FILL — Heparin Sodium (Porcine) Inj 1000 Unit/ML: INTRAMUSCULAR | Qty: 30 | Status: AC

## 2011-06-15 MED FILL — Electrolyte-R (PH 7.4) Solution: INTRAVENOUS | Qty: 6000 | Status: AC

## 2011-06-15 MED FILL — Sodium Chloride Irrigation Soln 0.9%: Qty: 3000 | Status: AC

## 2011-06-15 NOTE — Progress Notes (Signed)
Nursing: Pt transfer to 2035 with all medication, dentures and bath material. Pt ambulated to bed with assistance from [employer] Pt reports no pain, SOB or discomfort at present time. Pt's daughter n law Toniann Fail, called to make aware of transfer.

## 2011-06-15 NOTE — Op Note (Signed)
[name], [name] NO.:  [id number]  MEDICAL RECORD NO.:  [id number]  LOCATION:  2312                         FACILITY:  [facility]  PHYSICIAN:  Evelene Croon, M.D.     DATE OF BIRTH:  1933-10-09  DATE OF PROCEDURE:  06/14/2011 DATE OF DISCHARGE:                              OPERATIVE REPORT   PREOPERATIVE DIAGNOSIS:  Severe aortic stenosis.  POSTOPERATIVE DIAGNOSIS:  Severe aortic stenosis.  PROCEDURE:  Median sternotomy, extracorporeal circulation, aortic valve replacement using a 21-mm Edwards pericardial Magna-ease valve.  ATTENDING SURGEON:  Evelene Croon, MD  ASSISTANT:  Al Corpus, [facility]  ANESTHESIA:  General endotracheal.  CLINICAL HISTORY:  This patient is a 78 year old woman with a known history of severe aortic stenosis, who presents with a several month history of exertional dyspnea particularly with walking.  She has also had significant fatigue.  Her most recent echocardiogram in June 2012 showed peak aortic valve gradient that increases 77 mmHg and mean gradient had increased to 50 mmHg.  Aortic valve area was 0.7 cm squared.  Cardiac catheterization was performed and showed normal coronary arteries with normal right heart pressures.  After review of the studies and examination, the patient was felt that aortic valve replacement was the best treatment to prevent progressive congestive heart failure, and left ventricular deterioration and improve her quality of life.  I discussed the operative procedure with the patient and her family.  We discussed alternatives for valve prosthesis including mechanical and tissue valves.  We decided that a tissue valve would be the best choice given her age of 34 years.  I discussed the benefits and risks of surgery including, but not limited to, bleeding, blood transfusion, infection, stroke, myocardial infarction, heart block requiring permanent pacemaker, organ dysfunction, and death.  She understood all of  this and agreed to proceed.  The patient taken to the operative room, placed on table in supine position.  After induction of general endotracheal anesthesia, a Foley catheter was placed in bladder using sterile technique.  Then, the chest, abdomen, and both lower extremities were prepped and draped in usual sterile manner.  Transesophageal echocardiogram was performed. This showed severe calcific aortic stenosis.  There was trivial mitral regurgitation.  There was moderate concentric left ventricular hypertrophy.  Then, the chest was opened through a median sternotomy incision.  The pericardium opened midline.  Exam of the heart showed good ventricular contractility.  The ascending aorta of normal size and had no palpable plaques in it.  Then, the patient was heparinized.  When adequate ACT was obtained  the distal ascending aorta was cannulated using a 20-French aortic cannula for arterial inflow.  Venous outflow was achieved using a two-stage venous cannula for the right atrial appendage.  An antegrade cardioplegia and vent cannula was inserted in aortic root.  A retrograde cardioplegia cannula was inserted through a pursestring suture in the right atrium and advanced in the coronary sinus.  The left ventricular vent was placed through the right superior pulmonary vein.  The patient was then placed on cardiopulmonary bypass and distal coronaries identified.  There was noted coronary plaque present.  Then, the aorta was crossclamped and 1000  mL of cold blood antegrade cardioplegia was administered in the aortic root with quick arrest of the heart.  Systemic hypothermia to 32 degrees centigrade and topical hypothermic iced saline was used.  A temperature probe was placed in septum insulating pad and pericardium.  Additional doses of retrograde cold blood cardioplegia were given about 20 minutes intervals maintaining myocardial temperature around 10 degrees centigrade or  less.  Then, the aorta was opened transversely just above the sinotubular junction.  Examination of the valve showed 3 leaflets that were heavily calcified with fusion of the commissures and very small opening between the leaflets.  The right and left coronary ostia were identified and there was no significant disease around them.  Then, the native valve was excised.  The anulus was decalcified with rongeurs.  Care was taken to remove all particulate debris.  The left ventricle and aortic root were irrigated with iced saline solution.  The anulus was sized and a 21 mm Edwards pericardial Magna-ease valve was chosen.  This had model #[id number] serial [id number].  Then, a series of pledgeted 2-0 Ethibond horizontal mattress sutures were placed around the aortic anulus with the pledgets in a subannular position.  The sutures were then placed through the sewing ring and the valve lowered into place.  The sutures were tied sequentially.  The valve seated nicely.  Coronary ostia were examined and were not obstructed.  Then, the patient rewarmed to 37 degrees centigrade.  The aortotomy was closed in 2 layers using continuous 4-0 Prolene suture with felt strips to reinforce closure. Then, the left side of the heart was de-aired.  We did use CO2 insufflation in the pericardium throughout the case to minimize intracardiac air.  Then, the head was placed in Trendelenburg position and the crossclamp removed with time 61 minutes.  There was spontaneous return of ventricular fibrillation.  The patient was defibrillated into sinus rhythm.  The aortotomy appeared hemostatic.  Two temporary right ventricular and right atrial pacing wires placed and brought through the skin.  When the patient rewarmed to 37 degrees centigrade, she was weaned from cardiopulmonary bypass on no inotropic agents.  Total bypass time was 98 minutes.  Cardiac function appeared excellent.  TEE showed normal functioning aortic  valve prosthesis without evidence of perivalvular leak or central regurgitation.  There was no mitral regurgitation.  Left ventricular function appeared preserved.  Then, protamine was given, and the venous and aortic cannulas removed without difficulty.  Hemostasis was achieved.  Two chest tubes were placed with 2 in the post pericardium and 1 anterior mediastinum.  The sternum was then closed with double lumen 6 stainless steel wires.  The fascia was closed with continuous # 1 Vicryl suture.  Subcu tissue was closed with continuous 2- 0 Vicryl and the skin with a 3-0 Vicryl subcuticular closure.  The sponge, needle, and instrument counts were correct according to the scrub nurse.  Dry sterile dressing applied over the incision around the chest tubes, which were Pleur-Evac suction.  The patient remained hemodynamically stable and transferred to the SICU in guarded, but stable condition.     Evelene Croon, M.D.     BB/MEDQ  D:  06/14/2011  T:  06/15/2011  Job:  [id number]

## 2011-06-15 NOTE — Progress Notes (Signed)
2 Days Post-Op Procedure(s) (LRB): AORTIC VALVE REPLACEMENT (AVR) (N/A) Subjective: No complaints  Objective: Vital signs in last 24 hours: Temp:  [98.3 F (36.8 C)-99 F (37.2 C)] 98.7 F (37.1 C) (01/18 0328) Pulse Rate:  [75-92] 86  (01/18 0600) Cardiac Rhythm:  [-] Normal sinus rhythm (01/18 0600) Resp:  [14-23] 19  (01/18 0600) BP: (114-154)/(39-58) 122/44 mmHg (01/18 0600) SpO2:  [93 %-100 %] 95 % (01/18 0600) Arterial Line BP: (144-151)/(42-49) 151/42 mmHg (01/17 1100) Weight:  [91.9 kg (202 lb 9.6 oz)] 91.9 kg (202 lb 9.6 oz) (01/18 0500)  Hemodynamic parameters for last 24 hours: PAP: (21-27)/(11-16) 21/11 mmHg  Intake/Output from previous day: 01/17 0701 - 01/18 0700 In: 992 [P.O.:310; I.V.:580; IV Piggyback:102] Out: 9562 [ZHYQM:5784; Chest Tube:70] Intake/Output this shift:    General appearance: alert and cooperative Heart: regular rate and rhythm, S1, S2 normal, no murmur, click, rub or gallop Lungs: clear to auscultation bilaterally Extremities: edema mild Wound: dressing dry  Lab Results:  Basename 06/15/11 0450 06/14/11 1650  WBC 7.5 8.3  HGB 9.6* 10.7*  HCT 27.7* 30.7*  PLT 70* 82*   BMET:  Basename 06/15/11 0450 06/14/11 1650 06/14/11 1645 06/14/11 0400  NA 135 -- 136 --  K 3.6 -- 4.5 --  CL 102 -- 100 --  CO2 28 -- -- 25  GLUCOSE 130* -- 159* --  BUN 13 -- 15 --  CREATININE 0.57 0.56 -- --  CALCIUM 7.9* -- -- 7.9*    PT/INR:  Basename 06/13/11 1333  LABPROT 17.4*  INR 1.40   ABG    Component Value Date/Time   PHART 7.316* 06/13/2011 2006   HCO3 22.6 06/13/2011 2006   TCO2 27 06/14/2011 1645   ACIDBASEDEF 3.0* 06/13/2011 2006   O2SAT 97.0 06/13/2011 2006   CBG (last 3)   Basename 06/15/11 0324 06/15/11 0034 06/14/11 1822  GLUCAP 121* 95 157*    Assessment/Plan: S/P Procedure(s) (LRB): AORTIC VALVE REPLACEMENT (AVR) (N/A) Mobilize Diuresis Diabetes control Plan for transfer to step-down: see transfer orders   LOS: 2 days      Evelene Croon K 06/15/2011

## 2011-06-15 NOTE — Progress Notes (Signed)
CARDIAC REHAB PHASE I   PRE:  Rate/Rhythm: 85 SR  BP:  Supine:   Sitting: 118/60  Standing:    SaO2: 96 RA  MODE:  Ambulation: 400 ft   POST:  Rate/Rhythem: 93  BP:  Supine:   Sitting: 132/60  Standing:    SaO2: 96 RA 1350-1430  On arrival pt asking for O2, states she feels that she can't take a deep breath. RA sat 96%. Pt is dyspneic  with talking. Assisted X 1 and used walker to ambulate. Gait steady. DOE noted but after walk sat RA 96%. Back to recliner after walk with call light in reach.  [name]

## 2011-06-15 NOTE — Plan of Care (Signed)
Problem: Phase II Progression Outcomes Goal: Other Phase II Outcomes/Goals Outcome: Completed/Met Date Met:  06/15/11 Ambulate with assistance

## 2011-06-16 LAB — GLUCOSE, CAPILLARY
Glucose-Capillary: 117 mg/dL — ABNORMAL HIGH (ref 70–99)
Glucose-Capillary: 107 mg/dL — ABNORMAL HIGH (ref 70–99)
Glucose-Capillary: 100 mg/dL — ABNORMAL HIGH (ref 70–99)
Glucose-Capillary: 83 mg/dL (ref 70–99)
Glucose-Capillary: 110 mg/dL — ABNORMAL HIGH (ref 70–99)

## 2011-06-16 LAB — CBC
Hemoglobin: 9.5 g/dL — ABNORMAL LOW (ref 12.0–15.0)
MCH: 29.9 pg (ref 26.0–34.0)
MCV: 87.1 fL (ref 78.0–100.0)
Platelets: 83 10*3/uL — ABNORMAL LOW (ref 150–400)
RBC: 3.18 MIL/uL — ABNORMAL LOW (ref 3.87–5.11)
WBC: 5.7 10*3/uL (ref 4.0–10.5)

## 2011-06-16 LAB — BASIC METABOLIC PANEL
CO2: 29 mEq/L (ref 19–32)
Chloride: 107 mEq/L (ref 96–112)
Glucose, Bld: 112 mg/dL — ABNORMAL HIGH (ref 70–99)
Sodium: 141 mEq/L (ref 135–145)

## 2011-06-16 NOTE — Progress Notes (Addendum)
CARDIAC REHAB PHASE I   PRE:  Rate/Rhythm: 82 SR  BP:  Supine:   Sitting:   Standing: 152/60   SaO2: 98% RA  MODE:  Ambulation: 500 ft   POST:  Rate/Rhythem: 94  BP:  Supine:   Sitting: 160/82  Standing:    SaO2: 95% RA  1400-1421 Pt very motivated to walk, this is her 2nd walk of the day. Pt tolerated ambulation well with assist x1 and pushing a rolling walker, gait steady. One standing rest break taken, noticeably SOB toward end of walk, encouraged pursed-lip breathing. Assisted to bathroom, then to chair after walk. SaO2 95% RA after walk, to chair, BP 160/82. Pt c/o mild soreness around bottom of sternal incision, which she thinks is from poor posture while sitting in the chair, informed patient's nurse of c/o soreness, no other c/o.   Annetta Maw

## 2011-06-16 NOTE — Progress Notes (Addendum)
[name]            [place] [address]          [phone]     3 Days Post-Op  Procedure(s) (LRB): AORTIC VALVE REPLACEMENT (AVR) (N/A) Subjective: Looks and feels well  Objective  Telemetry NSR  Temp:  [97.8 F (36.6 C)-98.7 F (37.1 C)] 98.6 F (37 C) (01/19 0418) Pulse Rate:  [78-84] 84  (01/19 0418) Resp:  [18-22] 19  (01/19 0418) BP: (126-142)/(57-66) 131/66 mmHg (01/19 0418) SpO2:  [94 %-96 %] 96 % (01/19 0418) Weight:  [199 lb 12.8 oz (90.629 kg)] 199 lb 12.8 oz (90.629 kg) (01/19 0500)   Intake/Output Summary (Last 24 hours) at 06/16/11 1053 Last data filed at 06/16/11 0745  Gross per 24 hour  Intake    483 ml  Output   1500 ml  Net  -1017 ml       General appearance: alert, cooperative and no distress Heart: regular rate and rhythm and S1, S2 normal Lungs: mildly diminished in the L base Abdomen: soft, nontender, nondistended Extremities: minor LE edema Wound: incision healing well without signs of infection  Lab Results:  Basename 06/16/11 0610 06/15/11 0450 06/14/11 1650 06/14/11 0400  NA 141 135 -- --  K 4.0 3.6 -- --  CL 107 102 -- --  CO2 29 28 -- --  GLUCOSE 112* 130* -- --  BUN 11 13 -- --  CREATININE 0.55 0.57 -- --  CALCIUM 8.2* 7.9* -- --  MG -- -- 2.2 2.6*  PHOS -- -- -- --   No results found for this basename: AST:2,ALT:2,ALKPHOS:2,BILITOT:2,PROT:2,ALBUMIN:2 in the last 72 hours No results found for this basename: LIPASE:2,AMYLASE:2 in the last 72 hours  Basename 06/16/11 0610 06/15/11 0450  WBC 5.7 7.5  NEUTROABS -- --  HGB 9.5* 9.6*  HCT 27.7* 27.7*  MCV 87.1 85.2  PLT 83* 70*   No results found for this basename: CKTOTAL:4,CKMB:4,TROPONINI:4 in the last 72 hours No components found with this basename: POCBNP:3 No results found for this basename: DDIMER in the last 72 hours No results found for this basename: HGBA1C in the last 72 hours No results found for this basename:  CHOL,HDL,LDLCALC,TRIG,CHOLHDL in the last 72 hours No results found for this basename: TSH,T4TOTAL,FREET3,T3FREE,THYROIDAB in the last 72 hours No results found for this basename: VITAMINB12,FOLATE,FERRITIN,TIBC,IRON,RETICCTPCT in the last 72 hours  Medications: Scheduled    . aspirin EC  325 mg Oral Daily  . carvedilol  12.5 mg Oral BID WC  . docusate sodium  200 mg Oral Daily  . furosemide  40 mg Oral Daily  . insulin aspart  0-24 Units Subcutaneous TID AC & HS  . insulin glargine  10 Units Subcutaneous Daily  . moving right along book   Does not apply Once  . pantoprazole  40 mg Oral Q1200  . potassium chloride  20 mEq Oral BID  . sodium chloride  3 mL Intravenous Q12H  . DISCONTD: acetaminophen (TYLENOL) oral liquid 160 mg/5 mL  975 mg Per Tube Q6H  . DISCONTD: acetaminophen  1,000 mg Oral Q6H  . DISCONTD: aspirin  324 mg Per Tube Daily  . DISCONTD: aspirin EC  325 mg Oral Daily  . DISCONTD: bisacodyl  10 mg Oral Daily  . DISCONTD: bisacodyl  10 mg Rectal Daily  . DISCONTD: carvedilol  6.25 mg Oral BID WC  .  DISCONTD: insulin aspart  0-24 Units Subcutaneous Q4H  . DISCONTD: insulin regular  0-10 Units Intravenous TID WC  . DISCONTD: sodium chloride  3 mL Intravenous Q12H     Radiology/Studies:  Dg Chest Portable 1 View In Am  06/15/2011  *RADIOLOGY REPORT*  Clinical Data: Postop cardiac surgery.  PORTABLE CHEST - 1 VIEW  Comparison: 06/14/2011.  Findings: Swan-Ganz catheter and mediastinal drains have been removed.  Introducer remains in place as does right central line. Introducer tip difficult to adequately assess appearing to be at the proximal superior vena cava level with the right central line tip mid superior vena cava level.  Post median sternotomy and valve replacement.  Cardiomegaly. Calcified aorta.  Central pulmonary vascular prominence.  No gross pneumothorax.  Small pleural effusions more notable on the left suspected.  IMPRESSION: Swan-Ganz catheter and mediastinal  drains have been removed.  Original Report Authenticated By: Fuller Canada, M.D.    INR: Will add last result for INR, ABG once components are confirmed Will add last 4 CBG results once components are confirmed  Assessment/Plan: S/P Procedure(s) (LRB): AORTIC VALVE REPLACEMENT (AVR) (N/A) Mobilize Diuresis pulm toilet CBG well controlled, d/c lantus   LOS: 3 days    [name],[name] 1/19/201310:53 AM   I have seen and examined [name] and agree with the above assessment  and plan.  [name] Ovens MD Beeper [phone] Office [phone] 06/16/2011 7:08 PM

## 2011-06-16 NOTE — Progress Notes (Signed)
06/16/2011 3:17 PM Patient ambulated around unit with Boniface Kebu RN this am totaling 569ft. Pt. Used RW and on RA. Pt. Tolerated well. Will continue to monitor.  Treazure Nery, Blanchard Kelch

## 2011-06-17 ENCOUNTER — Inpatient Hospital Stay (HOSPITAL_COMMUNITY): Payer: Medicare Other

## 2011-06-17 DIAGNOSIS — J90 Pleural effusion, not elsewhere classified: Secondary | ICD-10-CM | POA: Diagnosis not present

## 2011-06-17 DIAGNOSIS — Z954 Presence of other heart-valve replacement: Secondary | ICD-10-CM | POA: Diagnosis not present

## 2011-06-17 LAB — GLUCOSE, CAPILLARY
Glucose-Capillary: 89 mg/dL (ref 70–99)
Glucose-Capillary: 114 mg/dL — ABNORMAL HIGH (ref 70–99)

## 2011-06-17 MED ORDER — MAGNESIUM HYDROXIDE 400 MG/5ML PO SUSP
15.0000 mL | Freq: Every day | ORAL | Status: DC | PRN
  Administered 2011-06-17: 15 mL via ORAL
  Filled 2011-06-17: qty 30

## 2011-06-17 MED ORDER — CARVEDILOL 12.5 MG PO TABS: 12.5000 mg | ORAL_TABLET | Freq: Two times a day (BID) | ORAL | Status: DC

## 2011-06-17 MED ORDER — OXYCODONE HCL 5 MG PO TABS: 5.0000 mg | ORAL_TABLET | ORAL | Status: AC | PRN

## 2011-06-17 NOTE — Progress Notes (Addendum)
4 Days Post-Op  Procedure(s) (LRB): AORTIC VALVE REPLACEMENT (AVR) (N/A) Subjective: Still weak, but improving. Mild DOE  Objective  Telemetry NSR, PVC's  Temp:  [98.2 F (36.8 C)-98.6 F (37 C)] 98.4 F (36.9 C) (01/20 0434) Pulse Rate:  [78-89] 78  (01/20 0434) Resp:  [18-20] 19  (01/20 0434) BP: (122-146)/(70-84) 144/70 mmHg (01/20 0434) SpO2:  [95 %-97 %] 95 % (01/20 0434) Weight:  [200 lb 1.6 oz (90.765 kg)] 200 lb 1.6 oz (90.765 kg) (01/20 0434)   Intake/Output Summary (Last 24 hours) at 06/17/11 0925 Last data filed at 06/17/11 0750  Gross per 24 hour  Intake    720 ml  Output   1350 ml  Net   -630 ml       General appearance: alert and no distress Heart: RRR, 1-2/6syst flow murmur Lungs: clear to auscultation bilaterally Abdomen: soft,nontender, nondistended Extremities: minor LE edema Wound: incision healing well without signs of infection  Lab Results:  Basename 06/16/11 0610 06/15/11 0450 06/14/11 1650  NA 141 135 --  K 4.0 3.6 --  CL 107 102 --  CO2 29 28 --  GLUCOSE 112* 130* --  BUN 11 13 --  CREATININE 0.55 0.57 --  CALCIUM 8.2* 7.9* --  MG -- -- 2.2  PHOS -- -- --   No results found for this basename: AST:2,ALT:2,ALKPHOS:2,BILITOT:2,PROT:2,ALBUMIN:2 in the last 72 hours No results found for this basename: LIPASE:2,AMYLASE:2 in the last 72 hours  Basename 06/16/11 0610 06/15/11 0450  WBC 5.7 7.5  NEUTROABS -- --  HGB 9.5* 9.6*  HCT 27.7* 27.7*  MCV 87.1 85.2  PLT 83* 70*   No results found for this basename: CKTOTAL:4,CKMB:4,TROPONINI:4 in the last 72 hours No components found with this basename: POCBNP:3 No results found for this basename: DDIMER in the last 72 hours No results found for this basename: HGBA1C in the last 72 hours No results found for this basename: CHOL,HDL,LDLCALC,TRIG,CHOLHDL in the last 72 hours No results found for this basename: TSH,T4TOTAL,FREET3,T3FREE,THYROIDAB in the last 72 hours No results found for this  basename: VITAMINB12,FOLATE,FERRITIN,TIBC,IRON,RETICCTPCT in the last 72 hours  Medications: Scheduled    . aspirin EC  325 mg Oral Daily  . carvedilol  12.5 mg Oral BID WC  . docusate sodium  200 mg Oral Daily  . furosemide  40 mg Oral Daily  . insulin aspart  0-24 Units Subcutaneous TID AC & HS  . moving right along book   Does not apply Once  . pantoprazole  40 mg Oral Q1200  . potassium chloride  20 mEq Oral BID  . sodium chloride  3 mL Intravenous Q12H  . DISCONTD: insulin glargine  10 Units Subcutaneous Daily     Radiology/Studies:  No results found.  INR: Will add last result for INR, ABG once components are confirmed Will add last 4 CBG results once components are confirmed  Assessment/Plan: S/P Procedure(s) (LRB): AORTIC VALVE REPLACEMENT (AVR) (N/A) 1. Good progress 2. Cont rehab, pulm toilet 3. Gentle diuresis   LOS: 4 days    GOLD,WAYNE E 1/20/20139:25 AM   mild exertional dyspnea 02 sats Check cxr pa/lat I have seen and examined [name] and agree with the above assessment  and plan.  Delight Ovens MD Beeper [phone] Office [phone] 06/17/2011 12:19 PM

## 2011-06-17 NOTE — Progress Notes (Signed)
06/17/2011 6:51 PM Nursing Note: Patient ambulated today 550 ft with RN, RW and on RA. Pt. Tolerated procedure well. Encouraged one more walk this evening. Moving right along book also reinforced with patient and questions/concerns addressed. [name], Blanchard Kelch

## 2011-06-18 LAB — GLUCOSE, CAPILLARY
Glucose-Capillary: 86 mg/dL (ref 70–99)
Glucose-Capillary: 128 mg/dL — ABNORMAL HIGH (ref 70–99)

## 2011-06-18 MED ORDER — FUROSEMIDE 10 MG/ML IJ SOLN
40.0000 mg | Freq: Two times a day (BID) | INTRAMUSCULAR | Status: AC
  Administered 2011-06-18 (×2): 40 mg via INTRAVENOUS
  Filled 2011-06-18 (×2): qty 4

## 2011-06-18 MED ORDER — POTASSIUM CHLORIDE CRYS ER 20 MEQ PO TBCR
40.0000 meq | EXTENDED_RELEASE_TABLET | Freq: Two times a day (BID) | ORAL | Status: AC
  Administered 2011-06-18 (×2): 40 meq via ORAL
  Filled 2011-06-18: qty 2

## 2011-06-18 NOTE — Progress Notes (Signed)
CARDIAC REHAB PHASE I   PRE:  Rate/Rhythm: 81 SR  BP:  Supine:   Sitting: 120/50  Standing:    SaO2: 95 RA  MODE:  Ambulation: 740 ft   POST:  Rate/Rhythem: 87  BP:  Supine:   Sitting: 126/60  Standing:    SaO2: 97 RA 0830-0910  Assisted X 1 and used walker to ambulate. Gait steady with walker. Walked 740 ft with encouragement. She is DOE, but RA sat 97% after walk. Discussed Outpt. CRP, she agrees to referral to [place]. Got recovery from [facility] video on for her to view.  Beatrix Fetters

## 2011-06-18 NOTE — Progress Notes (Addendum)
[address]            [place] [address]          [phone]     5 Days Post-Op  Procedure(s) (LRB): AORTIC VALVE REPLACEMENT (AVR) (N/A) Subjective: Feels better, still with DOE but improved  Objective  Telemetry NSR  Temp:  [98.4 F (36.9 C)-99.1 F (37.3 C)] 99 F (37.2 C) (01/21 0430) Pulse Rate:  [79-94] 82  (01/21 0430) Resp:  [16-19] 19  (01/21 0430) BP: (123-129)/(62-69) 123/66 mmHg (01/21 0430) SpO2:  [94 %-96 %] 96 % (01/21 0430) Weight:  [199 lb 11.8 oz (90.6 kg)] 199 lb 11.8 oz (90.6 kg) (01/21 0430)   Intake/Output Summary (Last 24 hours) at 06/18/11 0737 Last data filed at 06/17/11 1700  Gross per 24 hour  Intake    720 ml  Output    300 ml  Net    420 ml   Physical Examination: General appearance - alert, well appearing, and in no distress Chest - coarse BS in upper airways, improves with cough Heart - RRR, syst flow Murmur Abdomen - soft, nontender, nondistended, no masses or organomegaly Extremities - minor LE edema Skin - incisions healing well        Lab Results:  Basename 06/16/11 0610  NA 141  K 4.0  CL 107  CO2 29  GLUCOSE 112*  BUN 11  CREATININE 0.55  CALCIUM 8.2*  MG --  PHOS --   No results found for this basename: AST:2,ALT:2,ALKPHOS:2,BILITOT:2,PROT:2,ALBUMIN:2 in the last 72 hours No results found for this basename: LIPASE:2,AMYLASE:2 in the last 72 hours  Basename 06/16/11 0610  WBC 5.7  NEUTROABS --  HGB 9.5*  HCT 27.7*  MCV 87.1  PLT 83*   No results found for this basename: CKTOTAL:4,CKMB:4,TROPONINI:4 in the last 72 hours No components found with this basename: POCBNP:3 No results found for this basename: DDIMER in the last 72 hours No results found for this basename: HGBA1C in the last 72 hours No results found for this basename: CHOL,HDL,LDLCALC,TRIG,CHOLHDL in the last 72 hours No results found for this basename: TSH,T4TOTAL,FREET3,T3FREE,THYROIDAB in the last 72  hours No results found for this basename: VITAMINB12,FOLATE,FERRITIN,TIBC,IRON,RETICCTPCT in the last 72 hours  Medications: Scheduled    . aspirin EC  325 mg Oral Daily  . carvedilol  12.5 mg Oral BID WC  . docusate sodium  200 mg Oral Daily  . furosemide  40 mg Oral Daily  . insulin aspart  0-24 Units Subcutaneous TID AC & HS  . moving right along book   Does not apply Once  . pantoprazole  40 mg Oral Q1200  . potassium chloride  20 mEq Oral BID  . sodium chloride  3 mL Intravenous Q12H     Radiology/Studies:  Dg Chest 2 View  06/17/2011  *RADIOLOGY REPORT*  Clinical Data: Status post aortic valve replacement  CHEST - 2 VIEW  Comparison: 06/15/2011  Findings: Catheters have been removed.  Aortic valvuloplasty and evidence of median sternotomy noted.  Moderate enlargement of the cardiomediastinal silhouette is present without edema.  Small bilateral effusions are noted with probable bibasilar compressive atelectasis.  No acute osseous finding.  IMPRESSION: No new acute cardiopulmonary process. Bilateral small pleural effusions.  Original Report Authenticated By: Harrel Lemon, M.D.    INR: Will add last result for INR, ABG once components are confirmed Will add last  4 CBG results once components are confirmed  Assessment/Plan: S/P Procedure(s) (LRB): AORTIC VALVE REPLACEMENT (AVR) (N/A)  1. Progressing nicely, cont pilm toilet and rehab 2. D/c epw's  3. Poss home later today vs am   LOS: 5 days    GOLD,WAYNE E 1/21/20137:37 AM   CXR yesterday looked fairly good yesterday with minimal bilateral effusions and atelectasis.  Weight is still over preop and did not diurese much yesterday on oral lasix so will give IV lasix today. She still gets winded with activity in her room. Plan home tomorrow. DC pacing wires.

## 2011-06-19 NOTE — Progress Notes (Addendum)
[address]            [place] [address]          [phone]     6 Days Post-Op  Procedure(s) (LRB): AORTIC VALVE REPLACEMENT (AVR) (N/A) Subjective: Feels better, no tSOB  Objective  Telemetry NSR  Temp:  [97.4 F (36.3 C)-98.4 F (36.9 C)] 97.4 F (36.3 C) (01/22 0440) Pulse Rate:  [78-86] 78  (01/22 0735) Resp:  [18-19] 18  (01/22 0440) BP: (122-151)/(52-72) 151/72 mmHg (01/22 0735) SpO2:  [95 %-98 %] 97 % (01/22 0440) Weight:  [199 lb (90.266 kg)] 199 lb (90.266 kg) (01/22 0440)   Intake/Output Summary (Last 24 hours) at 06/19/11 0753 Last data filed at 06/18/11 2057  Gross per 24 hour  Intake    240 ml  Output   2400 ml  Net  -2160 ml       General appearance: alert and no distress Heart: regular rate and rhythm and 2/6 systolic murmur Lungs: diminished slightly in bases Abdomen: soft, non-tender; bowel sounds normal; no masses,  no organomegaly Extremities: trace edema Wound: incision healing well  Lab Results: No results found for this basename: NA:2,K:2,CL:2,CO2:2,GLUCOSE:2,BUN:2,CREATININE:2,CALCIUM:2,MG:2,PHOS:2 in the last 72 hours No results found for this basename: AST:2,ALT:2,ALKPHOS:2,BILITOT:2,PROT:2,ALBUMIN:2 in the last 72 hours No results found for this basename: LIPASE:2,AMYLASE:2 in the last 72 hours No results found for this basename: WBC:2,NEUTROABS:2,HGB:2,HCT:2,MCV:2,PLT:2 in the last 72 hours No results found for this basename: CKTOTAL:4,CKMB:4,TROPONINI:4 in the last 72 hours No components found with this basename: POCBNP:3 No results found for this basename: DDIMER in the last 72 hours No results found for this basename: HGBA1C in the last 72 hours No results found for this basename: CHOL,HDL,LDLCALC,TRIG,CHOLHDL in the last 72 hours No results found for this basename: TSH,T4TOTAL,FREET3,T3FREE,THYROIDAB in the last 72 hours No results found for this basename:  VITAMINB12,FOLATE,FERRITIN,TIBC,IRON,RETICCTPCT in the last 72 hours  Medications: Scheduled    . aspirin EC  325 mg Oral Daily  . carvedilol  12.5 mg Oral BID WC  . docusate sodium  200 mg Oral Daily  . furosemide  40 mg Intravenous BID  . insulin aspart  0-24 Units Subcutaneous TID AC & HS  . moving right along book   Does not apply Once  . pantoprazole  40 mg Oral Q1200  . potassium chloride  40 mEq Oral BID  . sodium chloride  3 mL Intravenous Q12H  . DISCONTD: furosemide  40 mg Oral Daily  . DISCONTD: potassium chloride  20 mEq Oral BID     Radiology/Studies:  Dg Chest 2 View  06/17/2011  *RADIOLOGY REPORT*  Clinical Data: Status post aortic valve replacement  CHEST - 2 VIEW  Comparison: 06/15/2011  Findings: Catheters have been removed.  Aortic valvuloplasty and evidence of median sternotomy noted.  Moderate enlargement of the cardiomediastinal silhouette is present without edema.  Small bilateral effusions are noted with probable bibasilar compressive atelectasis.  No acute osseous finding.  IMPRESSION: No new acute cardiopulmonary process. Bilateral small pleural effusions.  Original Report Authenticated By: Harrel Lemon, M.D.    INR: Will add last result for INR, ABG once components are confirmed Will add last 4 CBG results once components are confirmed  Assessment/Plan: S/P Procedure(s) (LRB): AORTIC VALVE REPLACEMENT (AVR) (N/A)  1. Good progress, stable for discharge   LOS: 6 days    GOLD,WAYNE E 1/22/20137:53 AM  Chart reviewed, patient examined, agree with above.

## 2011-06-19 NOTE — Progress Notes (Signed)
EPWs and CT sutures removed per protocol not ectopy or other problems noted at this time.  Instructed on need for bedrest and monitoring of vital signs.  Will continue to monitor.

## 2011-06-19 NOTE — Progress Notes (Signed)
Cardiac Rehab  [phone] Education completed with pt. Offered to get pt rolling walker. Pt stated felt she would be ok without one. Referring to [facility] Phase [facility]. Calixto Pavel DunlapRN

## 2011-06-19 NOTE — Discharge Summary (Signed)
[address]            [place] [address]          [phone]      [name] Apr 20, 1934 78 y.o. [id number]  06/13/2011   [name] Borne, MD  Aortic Stenosis  HPI: At time of consultation: The patient is a 78 year old active woman with a known history of severe aortic stenosis who presents with a several month history of exertional dyspnea particularly with walking. She also reports significant fatigue. She has still been going to a gym and working out 3 days per week which she says she tolerates fairly well. She doesn't really do any walking at the gym but more of a weight training program. Her most recent echocardiogram in June of 2012 reportedly showed her peak aortic valve gradient increased to 77 mmHg and a mean gradient had increased to 50 mm mercury. Aortic valve area was 0.7 cm. She's not had a cardiac catheterization yet.  Past Medical History  Diagnosis Date  . Unspecified constipation  . Blood in stool  . Family history of colonic polyps  . Other abnormal glucose  . Pain in limb  . Edema  . Malignant neoplasm of corpus uteri, except isthmus  . Pain in joint, shoulder region  . Malignant neoplasm of corpus uteri, except isthmus  . Unspecified hearing loss  . Other symptoms involving cardiovascular system 12/1999  Echo, normal. Transthoracic echo LV function: vigorous 10/2005  . Aortic valve disorders 04/2005  carotid dopplers- mild plague bilateral/ aortic stenosis  . Alcohol abuse, unspecified  . Dyskinesia of esophagus  . Diaphragmatic hernia without mention of obstruction or gangrene  . Tobacco use disorder  . Internal hemorrhoids without mention of complication  . External hemorrhoids without mention of complication  . Disorder of bone and cartilage, unspecified  . Diverticulosis of colon (without mention of hemorrhage)  . Postmenopausal bleeding  . Unspecified essential hypertension  . Other and unspecified hyperlipidemia    Past Surgical History  Procedure Date  . Rotator cuff repair 05/2008  right  . Breast biopsy 5/11  DCIS  . Breast lumpectomy 11/2009  DCIS, neg sentinel lymph node biopsy  . Laparoscopy w/ total bilateral pelvic and peri-aortic lymphadenectomy 11/04/2007  . Total abdominal hysterectomy w/ bilateral salpingoophorectomy 11/04/07  salpingo-oophrectomy  . Cataract extraction  Family History  Problem Relation Age of Onset  . Colon polyps Sister  2010  . Cancer Maternal Aunt  possible post menopausal breast cancer  . Heart disease Mother  . Stroke Father  Social History  History  Substance Use Topics  . Smoking status: Former Smoker  Quit date: 05/29/1999  . Smokeless tobacco: Not on file  . Alcohol Use: No  Current Outpatient Prescriptions  Medication Sig Dispense Refill  . aspirin 325 MG tablet Take 325 mg by mouth. Take 1/2 tablet daily  . carvedilol (COREG) 25 MG tablet TAKE 1 TABLET TWICE A DAY 180 tablet 1  . Cholecalciferol (VITAMIN D) 1000 UNITS capsule Take 1,000 Units by mouth 2 (two) times daily.  Marland Kitchen guaiFENesin (MUCINEX) 600 MG 12 hr tablet Take 1,200 mg by mouth 2 (two) times daily.  . hydrochlorothiazide (HYDRODIURIL) 25 MG tablet TAKE 1 TABLET DAILY 90 tablet 2  . Omega-3 Fatty Acids (FISH OIL) 1000 MG CAPS Take by mouth 2 (two) times daily.  . potassium chloride SA (K-DUR,KLOR-CON) 20 MEQ  tablet Take 20 mEq by mouth 2 (two) times daily.  Allergies  Allergen Reactions  . Calcium  REACTION: constipation  . Ezetimibe  REACTION: fatigue  . Ramipril  REACTION: abdominal pain  . Simvastatin  REACTION: leg pain and body aches  . Valsartan  REACTION: abdominal pain  Review of Systems: At time of consultation: Gen.: She denies any fever or chills. She's had no recent weight changes. She does report exertional fatigue.  Eyes: Negative  ENT: Negative. She does see a dentist regularly.  Endocrine: She denies diabetes and hypothyroidism.  Cardiac: She denies any chest  pain or pressure. She denies PND and orthopnea. She has had bilateral lower extremity edema which has been present for a few years. She denies palpitations.  Respiratory: She denies cough and sputum production.  GI: She denies any nausea or vomiting. She's had no melena or bright blood per rectum.  GU: She denies dysuria and hematuria.  Neurological: She denies any focal weakness or numbness. She denies dizziness and syncope. She's never had a TIA or stroke.  LMP 05/29/2007  Physical Exam: At time of consultation: BP 156/88  Pulse 72  Resp 16  Ht 5\' 5"  (1.651 m)  Wt 197 lb (89.359 kg)  BMI 32.78 kg/m2  SpO2 95%  LMP 05/29/2007  She is a well-developed, well-nourished white female in no distress.  HEENT: Normocephalic and atraumatic. Pupils are equal and reactive to light and accommodation. Extraocular muscles are intact. Oropharynx is clear.  Neck: Carotid pulses are palpable bilaterally. There is a transmitted murmur to both sides of her neck. There is no adenopathy or thyromegaly.  Lungs: Clear  Heart: Regular rate and rhythm with a grade 3/6 harsh systolic murmur over the aorta.  Abdomen: Bowel sounds are positive. Abdomen soft nontender. There are no palpable masses or organomegaly.  Extremities: There is mild to moderate bilateral lower extremity edema to the knees. Pedal pulses are palpable bilaterally.  Neurological: Alert and oriented x3. Motor and sensory exams are grossly normal.  Diagnostic Tests:  --------------------------------------------------------------------  Transthoracic Echocardiography  Patient: [name], [name]  MR #: [id number]  Study Date: 07/28/2009  Gender: F  Age: 23  Height: 167.6cm  Weight: 87.5kg  BSA: 1.60m^2  Pt. Status:  Room:  ADMITTING [name], MD  ATTENDING [name], MD  ORDERING [name], MD  SONOGRAPHER Aida Raider  PERFORMING [facility]  cc:   --------------------------------------------------------------------  Indications: 424.1 Aortic valve disorders.  --------------------------------------------------------------------  History: PMH: History of AS. History of lower extremity edema. Risk  factors: Hypertension. Dyslipidemia.  --------------------------------------------------------------------  Study Conclusions  - Left ventricle: The cavity size was normal. Wall thickness was  increased in a pattern of mild LVH. Systolic function was normal.  The estimated ejection fraction was in the range of 60% to 65%.  Features are consistent with a pseudonormal left ventricular  filling pattern, with concomitant abnormal relaxation and  increased filling pressure (grade 2 diastolic dysfunction).  - Pericardium, extracardiac: A trivial pericardial effusion was  identified.  Transthoracic echocardiography. M-mode, complete 2D, spectral  Doppler, and color Doppler. Height: Height: 167.6cm. Height: 66in.  Weight: Weight: 87.5kg. Weight: 192.6lb. Body mass index: BMI:  31.2kg/m^2. Body surface area: BSA: 1.79m^2. Blood pressure: 122/80.  Patient status: Outpatient. Location: [facility]  --------------------------------------------------------------------  --------------------------------------------------------------------  Left ventricle: The cavity size was normal. Wall thickness was  increased in a pattern of mild LVH. Systolic function was normal.  The estimated ejection fraction was in the range  of 60% to 65%.  Features are consistent with a pseudonormal left ventricular filling  pattern, with concomitant abnormal relaxation and increased filling  pressure (grade 2 diastolic dysfunction).  --------------------------------------------------------------------  Aortic valve: AV is thickened, calcified with restricted motion.  Peak and mean gradients through the valve are 66 and 42 mm Hg  respectively consistent with moderately  severe to severeAS. Doppler:  No regurgitation. VTI ratio of LVOT to aortic valve: 0.25. Valve  area: 0.71cm^2(VTI). Indexed valve area: 0.36cm^2/m^2 (VTI). Valve  area: 0.63cm^2 (Vmax). Indexed valve area: 0.32cm^2/m^2 (Vmax).  Mean gradient: 44mm Hg (S). Peak gradient: 71mm Hg (S).  --------------------------------------------------------------------  Mitral valve: Structurally normal valve. Leaflet separation was  normal. Doppler: Transvalvular velocity was within the normal range.  There was no evidence for stenosis. No regurgitation. Peak  gradient: 4mm Hg (D).  --------------------------------------------------------------------  Left atrium: The atrium was normal in size.  --------------------------------------------------------------------  Right ventricle: The cavity size was normal. Wall thickness was  normal. Systolic function was normal.  --------------------------------------------------------------------  Pulmonic valve: Structurally normal valve. Cusp separation was  normal. Doppler: Transvalvular velocity was within the normal range.  Mild regurgitation.  --------------------------------------------------------------------  Right atrium: The atrium was normal in size.  --------------------------------------------------------------------  Pericardium: A trivial pericardial effusion was identified.  --------------------------------------------------------------------  Systemic veins:  Inferior vena cava: The vessel was normal in size; the respirophasic  diameter changes were in the normal range (= 50%); findings are  consistent with normal central venous pressure.  --------------------------------------------------------------------  2D measurements Normal Doppler measurements Normal  Left ventricle Main pulmonary artery  LVID ED, 37 mm 43-52 Pressure, S 19 mm Hg =30  chord, PLAX LVOT  LVID ES, 23 mm 23-38 VTI, S 28. cm ------  chord, PLAX 8  FS, chord, 38 % >29  Stroke vol 81. ml ------  PLAX 7  LVPW, ED 12 mm ------ Stroke index 41. ml/m^2 ------  IVS/LVPW 1 <1.3 4  ratio, ED Aortic valve  Ventricular septum Peak vel, S 421 cm/s ------  IVS, ED 12 mm ------ Mean vel, S 311 cm/s ------  LVOT VTI, S 115 cm ------  Diam 19 mm ------ Mean 44 mm Hg ------  Aorta gradient, S  Root diam, ED 26 mm ------ Peak 71 mm Hg ------  Left atrium gradient, S  AP dim 36 mm ------ VTI ratio 0.2 ------  AP dim index 1.83 cm/m^2 <2.2 LVOT/AV 5  Area, VTI 0.7 cm^2 ------  1  Area index 0.3 cm^2/m^2 ------  (VTI) 6  Area, Vmax 0.6 cm^2 ------  3  Area index 0.3 cm^2/m^2 ------  (Vmax) 2  Mitral valve  Peak E vel 97. cm/s ------  6  Peak A vel 89. cm/s ------  9  Deceleration 275 ms 150-23  time 0  Peak 4 mm Hg ------  gradient, D  Peak E/A 1.1 ------  ratio  Tricuspid valve  Regurg peak 187 cm/s ------  vel  Peak RV-RA 14 mm Hg ------  gradient, S  Systemic veins  Estimated 5 mm Hg ------  CVP  Right ventricle  Pressure, S 19 mm Hg <30  --------------------------------------------------------------------  Prepared and Electronically Authenticated by  Dietrich Pates, MD  2011-03-03T16:40:16.993     Dr. Laneta Simmers agreed with recommendations to proceed with aortic valve replacement and she was admitted to the hospital. On 06/13/2011 she was taken to the operating room and underwent the following procedure:   OPERATIVE REPORT  PREOPERATIVE DIAGNOSIS: Severe aortic stenosis.  POSTOPERATIVE DIAGNOSIS: Severe aortic stenosis.  PROCEDURE: Median sternotomy, extracorporeal circulation, aortic valve  replacement using a 21-mm Edwards pericardial Magna-ease valve.  ATTENDING SURGEON: Evelene Croon, MD  ASSISTANT: Al Corpus, [facility]  ANESTHESIA: General endotracheal. She tolerated the procedure well was due to the surgical intensive care he and stable condition  Postoperative hospital course:  Overall the patient has progressed quite nicely. She was  weaned from the ventilator without difficulty. All routine lines, monitors, drainage devices have been discontinued in the standard fashion. She does have an acute blood loss anemia. Most recent hemoglobin and hematocrit dated 06/16/2011 was 9.5 and 27.7 respectively. She has had a moderate postoperative volume overload but has responded well to diuretics over time. Incisions healing well without evidence of infection. She is tolerating gradually increasing activities using standard cardiac rehabilitation modalities. Oxygen has been weaned and she maintains good saturations on room air. She has had no significant cardiac dysrhythmias. Overall, her status is felt to be quite stable for discharge on today's date.     No results found for this basename: NA:2,K:2,CL:2,CO2:2,GLUCOSE:2,BUN:2,CRATININE:2,CALCIUM:2 in the last 72 hours No results found for this basename: WBC:2,HGB:2,HCT:2,PLT:2 in the last 72 hours No results found for this basename: INR:2 in the last 72 hours   Discharge Instructions:  The patient is discharged to home with extensive instructions on wound care and progressive ambulation.  They are instructed not to drive or perform any heavy lifting until returning to see the physician in his office.  Discharge Diagnosis:  Aortic Stenosis  Secondary Diagnosis: Patient Active Problem List  Diagnoses  . Malignant Neoplasm of Corpus Uteri, except Isthmus  . VITAMIN D DEFICIENCY  . HYPERLIPIDEMIA  . ALCOHOL ABUSE  . NICOTINE ADDICTION  . HEARING LOSS, LEFT EAR  . HYPERTENSION  . AORTIC STENOSIS  . HEMORRHOIDS, INTERNAL  . EXTERNAL HEMORRHOIDS  . ESOPHAGEAL SPASM  . HIATAL HERNIA  . DIVERTICULOSIS, COLON  . CONSTIPATION  . BLOOD IN STOOL  . OSTEOPENIA  . DEPENDENT EDEMA, LEGS  . CAROTID BRUITS, BILATERAL  . HYPERGLYCEMIA   Past Medical History  Diagnosis Date  . Unspecified constipation   . Blood in stool   . Family history of colonic polyps   . Other abnormal glucose    . Pain in limb   . Edema   . Pain in joint, shoulder region   . Unspecified hearing loss   . Other symptoms involving cardiovascular system 12/1999    Echo, normal. Transthoracic echo LV function: vigorous  10/2005  . Aortic valve disorders 04/2005    carotid dopplers- mild plague bilateral/ aortic stenosis  . Alcohol abuse, unspecified   . Dyskinesia of esophagus   . Diaphragmatic hernia without mention of obstruction or gangrene   . Tobacco use disorder   . Internal hemorrhoids without mention of complication   . Disorder of bone and cartilage, unspecified   . Diverticulosis of colon (without mention of hemorrhage)   . Postmenopausal bleeding   . Unspecified essential hypertension   . Other and unspecified hyperlipidemia   . Aortic stenosis   . Malignant neoplasm of corpus uteri, except isthmus   . Malignant neoplasm of corpus uteri, except isthmus   . Breast ca     rt  . External hemorrhoids without mention of complication   . Arthritis        Veda, Arrellano  Home Medication Instructions [id number]   Printed on:06/19/11 1042  Medication Information  Omega-3 Fatty Acids (FISH OIL) 1000 MG CAPS Take by mouth 2 (two) times daily.            Cholecalciferol (VITAMIN D) 1000 UNITS capsule Take 1,000 Units by mouth 2 (two) times daily.            aspirin 325 MG tablet Take 325 mg by mouth. Take 1/2 tablet daily           hydrochlorothiazide (HYDRODIURIL) 25 MG tablet Take 25 mg by mouth once.            potassium chloride SA (K-DUR,KLOR-CON) 20 MEQ tablet TAKE 1 TABLET TWICE A DAY           carvedilol (COREG) 12.5 MG tablet Take 1 tablet (12.5 mg total) by mouth 2 (two) times daily with a meal.           oxyCODONE (OXY IR/ROXICODONE) 5 MG immediate release tablet Take 1-2 tablets (5-10 mg total) by mouth every 4 (four) hours as needed.             Disposition: Discharged home  Patient's condition is Good  Gershon Crane, PA-C 06/19/2011   10:42 AM

## 2011-07-03 ENCOUNTER — Ambulatory Visit (INDEPENDENT_AMBULATORY_CARE_PROVIDER_SITE_OTHER): Payer: Medicare Other | Admitting: Physician Assistant

## 2011-07-03 ENCOUNTER — Encounter: Payer: Self-pay | Admitting: Physician Assistant

## 2011-07-03 VITALS — BP 138/68 | HR 68 | Ht 65.0 in | Wt 185.0 lb

## 2011-07-03 DIAGNOSIS — R1013 Epigastric pain: Secondary | ICD-10-CM | POA: Diagnosis not present

## 2011-07-03 DIAGNOSIS — I10 Essential (primary) hypertension: Secondary | ICD-10-CM | POA: Diagnosis not present

## 2011-07-03 DIAGNOSIS — I359 Nonrheumatic aortic valve disorder, unspecified: Secondary | ICD-10-CM | POA: Diagnosis not present

## 2011-07-03 DIAGNOSIS — K3189 Other diseases of stomach and duodenum: Secondary | ICD-10-CM

## 2011-07-03 NOTE — Assessment & Plan Note (Signed)
Doing well status post tissue AVR.  Arrange followup echocardiogram.  Followup with Dr. Excell Seltzer in 6-8 weeks.

## 2011-07-03 NOTE — Assessment & Plan Note (Signed)
Controlled.  

## 2011-07-03 NOTE — Assessment & Plan Note (Signed)
I offered her a prescription for an H2 receptor antagonist.  She would like to obtain this on her own over-the-counter.

## 2011-07-03 NOTE — Progress Notes (Signed)
[address] [place], [place]  [address] Phone: [phone] Fax:  [phone]  Date:  07/03/2011   Name:  [name]       DOB:  11-18-1933 MRN:  [id number]  PCP:  Dr. Milinda Antis Primary Cardiologist:  Dr. [name]  Primary Electrophysiologist:  None    History of Present Illness: [name] is a 78 y.o. female who presents for post hospital follow up.  She has a history of severe aortic stenosis.  This eventually progressed to the point where she required surgery.  Dr. Excell Seltzer saw her in December and set her up for cardiac catheterization which was performed 06/07/11.  This demonstrated no CAD.  She then underwent aortic valve replacement with Dr. Laneta Simmers with a 21 mm Edwards pericardial magna he is valve 06/13/11.  Postoperatively, she maintained sinus rhythm.  She was discharged 1/22.  Labs: Potassium 4, drainage or 0.55, hemoglobin 9.5, TSH 1.79.  Presurgical Dopplers were negative for ICA stenosis.  Today, she is doing well.  She denies chest discomfort with exertion.  She denies shortness of breath.  She denies orthopnea, PND.  She has chronic pedal edema which is improved.  She denies syncope or palpitations.  She does have chest discomfort after eating meals at times.  She denies belching or water brash symptoms.  Past Medical History  Diagnosis Date  . Unspecified constipation   . Blood in stool   . Family history of colonic polyps   . Other abnormal glucose   . Pain in limb   . Edema   . Pain in joint, shoulder region   . Unspecified hearing loss   . Other symptoms involving cardiovascular system 12/1999    Echo, normal. Transthoracic echo LV function: vigorous  10/2005  . Aortic valve disorders 04/2005    carotid dopplers- mild plague bilateral/ aortic stenosis  . Alcohol abuse, unspecified   . Dyskinesia of esophagus   . Diaphragmatic hernia without mention of obstruction or gangrene   . Tobacco use disorder   . Internal hemorrhoids without  mention of complication   . Disorder of bone and cartilage, unspecified   . Diverticulosis of colon (without mention of hemorrhage)   . Postmenopausal bleeding   . Unspecified essential hypertension   . Other and unspecified hyperlipidemia   . Aortic stenosis   . Malignant neoplasm of corpus uteri, except isthmus   . Malignant neoplasm of corpus uteri, except isthmus   . Breast CA     rt  . External hemorrhoids without mention of complication   . Arthritis     Current Outpatient Prescriptions  Medication Sig Dispense Refill  . aspirin 325 MG tablet Take 325 mg by mouth. Take 1/2 tablet daily      . carvedilol (COREG) 12.5 MG tablet Take 1 tablet (12.5 mg total) by mouth 2 (two) times daily with a meal.  60 tablet  1  . Cholecalciferol (VITAMIN D) 1000 UNITS capsule Take 1,000 Units by mouth 2 (two) times daily.       . hydrochlorothiazide (HYDRODIURIL) 25 MG tablet Take 25 mg by mouth once.       . Omega-3 Fatty Acids (FISH OIL) 1000 MG CAPS Take by mouth 2 (two) times daily.       Marland Kitchen oxyCODONE (OXY IR/ROXICODONE) 5 MG immediate release tablet Take by mouth. 1-2 tablets every 4 hours as needed for pain      . potassium chloride SA (K-DUR,KLOR-CON) 20 MEQ tablet TAKE  1 TABLET TWICE A DAY  180 tablet  2    Allergies: Allergies  Allergen Reactions  . Calcium     REACTION: constipation  . Ezetimibe     REACTION: fatigue  . Ramipril     REACTION: abdominal pain  . Simvastatin     REACTION: leg pain and body aches  . Valsartan     REACTION: abdominal pain    History  Substance Use Topics  . Smoking status: Former Smoker -- 1.0 packs/day    Quit date: 03/28/2000  . Smokeless tobacco: Not on file  . Alcohol Use: No     ROS:  Please see the history of present illness.    All other systems reviewed and negative.   PHYSICAL EXAM: VS:  BP 138/68  Pulse 68  Ht 5\' 5"  (1.651 m)  Wt 185 lb (83.915 kg)  BMI 30.79 kg/m2  LMP 05/29/2007 Well nourished, well developed, in no  acute distress HEENT: normal Neck: no JVD Cardiac:  normal S1, S2; RRR; no murmur Chest: Median sternotomy scar well healed without erythema or discharge Lungs:  clear to auscultation bilaterally, no wheezing, rhonchi or rales Abd: soft, nontender, no hepatomegaly Ext: no edema; Right groin without hematoma or bruit Skin: warm and dry Neuro:  CNs 2-12 intact, no focal abnormalities noted  EKG:  Sinus rhythm, heart rate 68, nonspecific interventricular conduction delay, nonspecific ST-T wave changes, No change from prior tracing  ASSESSMENT AND PLAN:

## 2011-07-03 NOTE — Patient Instructions (Signed)
Your physician recommends that you schedule a follow-up appointment in: 6-8 weeks with Dr Excell Seltzer Your physician has requested that you have an echocardiogram. Echocardiography is a painless test that uses sound waves to create images of your heart. It provides your doctor with information about the size and shape of your heart and how well your heart's chambers and valves are working. This procedure takes approximately one hour. There are no restrictions for this procedure.

## 2011-07-04 ENCOUNTER — Other Ambulatory Visit: Payer: Self-pay | Admitting: Surgery

## 2011-07-04 DIAGNOSIS — I359 Nonrheumatic aortic valve disorder, unspecified: Secondary | ICD-10-CM

## 2011-07-06 ENCOUNTER — Ambulatory Visit (HOSPITAL_COMMUNITY): Payer: Medicare Other | Attending: Cardiology | Admitting: Radiology

## 2011-07-06 DIAGNOSIS — Z87891 Personal history of nicotine dependence: Secondary | ICD-10-CM | POA: Diagnosis not present

## 2011-07-06 DIAGNOSIS — I10 Essential (primary) hypertension: Secondary | ICD-10-CM | POA: Insufficient documentation

## 2011-07-06 DIAGNOSIS — Z954 Presence of other heart-valve replacement: Secondary | ICD-10-CM | POA: Diagnosis not present

## 2011-07-06 DIAGNOSIS — I359 Nonrheumatic aortic valve disorder, unspecified: Secondary | ICD-10-CM | POA: Diagnosis not present

## 2011-07-06 DIAGNOSIS — E785 Hyperlipidemia, unspecified: Secondary | ICD-10-CM | POA: Diagnosis not present

## 2011-07-06 DIAGNOSIS — R079 Chest pain, unspecified: Secondary | ICD-10-CM | POA: Diagnosis not present

## 2011-07-09 ENCOUNTER — Telehealth: Payer: Self-pay | Admitting: *Deleted

## 2011-07-09 NOTE — Telephone Encounter (Signed)
Message copied by Tarri Fuller on Mon Jul 09, 2011 11:26 AM ------      Message from: [place], [place] T      Created: Mon Jul 09, 2011  8:11 AM       AVR looks good      Normal LV function      Tereso Newcomer, [place]  8:10 AM 07/09/2011

## 2011-07-09 NOTE — Telephone Encounter (Signed)
Pt notified of echo results today and gave verbal understanding of results. [name]

## 2011-07-10 ENCOUNTER — Ambulatory Visit (INDEPENDENT_AMBULATORY_CARE_PROVIDER_SITE_OTHER): Payer: Self-pay | Admitting: Surgery

## 2011-07-10 ENCOUNTER — Ambulatory Visit
Admission: RE | Admit: 2011-07-10 | Discharge: 2011-07-10 | Disposition: A | Discharge status: Home / Self Care | Payer: Medicare Other | Source: Ambulatory Visit | Attending: Surgery | Admitting: Surgery

## 2011-07-10 ENCOUNTER — Encounter: Payer: Self-pay | Admitting: Surgery

## 2011-07-10 VITALS — BP 157/80 | HR 84 | Resp 16 | Ht 65.0 in | Wt 184.0 lb

## 2011-07-10 DIAGNOSIS — I359 Nonrheumatic aortic valve disorder, unspecified: Secondary | ICD-10-CM

## 2011-07-10 DIAGNOSIS — Z954 Presence of other heart-valve replacement: Secondary | ICD-10-CM | POA: Diagnosis not present

## 2011-07-10 NOTE — Progress Notes (Signed)
[address]            [name] [address]          [phone]      HPI:  Patient returns for routine postoperative follow-up having undergone aortic valve replacement using a 21 mm Edwards pericardial valve on 06/14/2011. The patient's early postoperative recovery while in the hospital was notable for an uncomplicated postoperative course. Since hospital discharge the patient reports she has been feeling well. She is walking daily without chest pain or shortness of breath..   Current Outpatient Prescriptions  Medication Sig Dispense Refill  . aspirin 325 MG tablet Take 325 mg by mouth. Take 1/2 tablet daily      . carvedilol (COREG) 12.5 MG tablet Take 1 tablet (12.5 mg total) by mouth 2 (two) times daily with a meal.  60 tablet  1  . Cholecalciferol (VITAMIN D) 1000 UNITS capsule Take 1,000 Units by mouth 2 (two) times daily.       . hydrochlorothiazide (HYDRODIURIL) 25 MG tablet Take 25 mg by mouth once.       . Omega-3 Fatty Acids (FISH OIL) 1000 MG CAPS Take by mouth 2 (two) times daily.       . potassium chloride SA (K-DUR,KLOR-CON) 20 MEQ tablet TAKE 1 TABLET TWICE A DAY  180 tablet  2  . oxyCODONE (OXY IR/ROXICODONE) 5 MG immediate release tablet Take by mouth. 1-2 tablets every 4 hours as needed for pain        Physical Exam: BP 157/80  Pulse 84  Resp 16  Ht 5\' 5"  (1.651 m)  Wt 184 lb (83.462 kg)  BMI 30.62 kg/m2  SpO2 96%  LMP 05/29/2007 She looks well. Cardiac exam shows a regular rate and rhythm with normal valve sounds. Lung exam is clear. The chest incision is healing well and sternum is stable. There is no peripheral edema.  Diagnostic Tests: Chest x-ray today shows clear lung fields and no pleural effusions.   Transthoracic Echocardiography  Patient: [name], [name] MR #: [id number] Study Date: 07/06/2011 Gender: F Age: 78 Height: 165.1cm Weight: 83.9kg BSA: 1.48m^2 Pt. Status: Room:  ATTENDING Cassell Clement,  MD REFERRING [place], Lyndee Hensen, Scott REFERRING [place], [place] ORDERING [name], MD PERFORMING [facility], [facility] SONOGRAPHER Philomena Course, RDCS cc:  ------------------------------------------------------------ LV EF: 60% - 65%  ------------------------------------------------------------ Indications: Prosthetic valve - aortic V43.3.  ------------------------------------------------------------ History: PMH: Acquired from the patient and from the patient's chart. Chest pain. Bilateral lower extremity edema. Severe aortic stenosis, post prosthetic replacement, 21mm Edwards percardial magna valve, June 13, 2011. Risk factors: Former tobacco use. Hypertension. Dyslipidemia.  ------------------------------------------------------------ Study Conclusions  - Left ventricle: The cavity size was normal. Wall thickness was increased in a pattern of moderate LVH. Systolic function was normal. The estimated ejection fraction was in the range of 60% to 65%. Wall motion was normal; there were no regional wall motion abnormalities. Features are consistent with a pseudonormal left ventricular filling pattern, with concomitant abnormal relaxation and increased filling pressure (grade 2 diastolic dysfunction). E/medial e' > 15 suggests LV end diastolic pressure at least 20 mmHg. - Aortic valve: Abioprosthetic aortic valvewas present and functioning normally. The prosthesis had a normal range of motion. The sewing ring appeared normal, had no rocking motion, and showed no evidence of dehiscence. Trivial regurgitation. Gradient across bioprosthetic aortic valve is within normal limits. Mean gradient:  15mm Hg (S). Peak gradient: 28mm Hg (S). - Mitral valve: Mild regurgitation. - Left atrium: The atrium was mildly dilated. - Right ventricle: The cavity size was normal. Systolic function was normal. - Atrial septum: No defect or patent foramen ovale was identified. -  Pulmonary arteries: PA peak pressure: 29mm Hg (S). Impressions:  - Normal LV size with moderate LV hypertrophy. EF 60-65%. Moderate diastolic dysfunction. Normally-functioning bioprosthetic aortic valve. Normal RV size and systolicfunction. Mild mitral regurgitation.  ------------------------------------------------------------ Labs, prior tests, procedures, and surgery: Catheterization (June 07, 2011).  Transthoracic echocardiography. M-mode, complete 2D, spectral Doppler, and color Doppler. Height: Height: 165.1cm. Height: 65in. Weight: Weight: 83.9kg. Weight: 184.6lb. Body mass index: BMI: 30.8kg/m^2. Body surface area: BSA: 1.91m^2. Blood pressure: 138/68. Patient status: Outpatient. Location: [facility]  ------------------------------------------------------------  ------------------------------------------------------------ Left ventricle: The cavity size was normal. Wall thickness was increased in a pattern of moderate LVH. Systolic function was normal. The estimated ejection fraction was in the range of 60% to 65%. Wall motion was normal; there were no regional wall motion abnormalities. Features are consistent with a pseudonormal left ventricular filling pattern, with concomitant abnormal relaxation and increased filling pressure (grade 2 diastolic dysfunction). E/medial e' > 15 suggests LV end diastolic pressure at least 20 mmHg.  ------------------------------------------------------------ Aortic valve: Normal-sized annulus. Abioprosthetic aortic valvewas present and functioning normally. The prosthesis had a normal range of motion. The sewing ring appeared normal, had no rocking motion, and showed no evidence of dehiscence. Cusp separation was normal. Doppler: Trivial regurgitation. Gradient across bioprosthetic aortic valve is within normal limits. VTI ratio of LVOT to aortic valve: 0.39. Indexed valve area: 0.64cm^2/m^2 (VTI). Peak velocity ratio of  LVOT to aortic valve: 0.39. Indexed valve area: 0.64cm^2/m^2 (Vmax). Mean gradient: 15mm Hg (S). Peak gradient: 28mm Hg (S).  ------------------------------------------------------------ Aorta: Aortic root: The aortic root was normal in size. Ascending aorta: The ascending aorta was normal in size.  ------------------------------------------------------------ Mitral valve: Structurally normal valve. Leaflet separation was normal. Doppler: Transvalvular velocity was within the normal range. There was no evidence for stenosis. Mild regurgitation. Peak gradient: 7mm Hg (D).  ------------------------------------------------------------ Left atrium: The atrium was mildly dilated.  ------------------------------------------------------------ Atrial septum: No defect or patent foramen ovale was identified.  ------------------------------------------------------------ Right ventricle: The cavity size was normal. Systolic function was normal.  ------------------------------------------------------------ Pulmonic valve: Structurally normal valve. Cusp separation was normal. Doppler: Transvalvular velocity was within the normal range. No significant regurgitation.  ------------------------------------------------------------ Tricuspid valve: Structurally normal valve. Leaflet separation was normal. Doppler: Transvalvular velocity was within the normal range. Trivial regurgitation.  ------------------------------------------------------------ Right atrium: The atrium was normal in size.  ------------------------------------------------------------ Pericardium: The pericardium was normal in appearance.  ------------------------------------------------------------ Systemic veins: Inferior vena cava: The vessel was normal in size; the respirophasic diameter changes were in the normal range (= 50%); findings are consistent with normal central  venous pressure.  ------------------------------------------------------------  2D measurements Normal Doppler measurements Norma Left ventricle l LVID ED, 38.6 mm 43-52 Main pulmonary artery chord, Pressure, 29 mm Hg =30 PLAX S LVID ES, 26.2 mm 23-38 Left ventricle chord, Ea, lat 6.9 cm/s ----- PLAX ann, tiss 1 FS, chord, 32 % >29 DP PLAX E/Ea, lat 18. ----- LVPW, ED 15.7 mm ------ ann, tiss 67 IVS/LVPW 1.12 <1.3 DP ratio, ED Ea, med 5.3 cm/s ----- Ventricular septum ann, tiss 7 IVS, ED 17.6 mm ------ DP LVOT E/Ea, med 24. ----- Diam, S 20 mm ------ ann, tiss 02 Area 3.14 cm^2 ------ DP Diam 20 mm ------ LVOT Aorta  Peak vel, 102 cm/s ----- Root diam, 25 mm ------ S ED VTI, S 23. cm ----- Left atrium 6 AP dim 42 mm ------ HR 62 bpm ----- AP dim 2.2 cm/m^2 <2.2 Stroke vol 74. ml ----- index 1 Cardiac 4.6 L/min ----- output Cardiac 2.4 L/(min-m ----- index ^2) Stroke 38. ml/m^2 ----- index 8 Aortic valve Peak vel, 264 cm/s ----- S Mean vel, 171 cm/s ----- S VTI, S 60. cm ----- 5 Mean 14 mm Hg ----- gradient, S Peak 28 mm Hg ----- gradient, S VTI ratio 0.3 ----- LVOT/AV 9 Area index 0.6 cm^2/m^2 ----- (VTI) 4 Peak vel 0.3 ----- ratio, 9 LVOT/AV Area index 0.6 cm^2/m^2 ----- (Vmax) 4 Mitral valve Peak E vel 129 cm/s ----- Peak A vel 114 cm/s ----- Decelerati 211 ms 150-2 on time 30 Peak 7 mm Hg ----- gradient, D Peak E/A 1.1 ----- ratio Tricuspid valve Regurg 247 cm/s ----- peak vel Peak RV-RA 24 mm Hg ----- gradient, S Systemic veins Estimated 5 mm Hg ----- CVP  ------------------------------------------------------------ Prepared and Electronically Authenticated by  Marca Ancona, MD [id number]  Impression:  Overall she is making a good recovery following her surgery. Her valve is functioning normally by echocardiogram. 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  Plan:  She will continue to followup with cardiology and will contact me if he develops any problems with her incisions.

## 2011-07-12 ENCOUNTER — Encounter (HOSPITAL_COMMUNITY): Payer: Self-pay

## 2011-07-12 ENCOUNTER — Encounter (HOSPITAL_COMMUNITY)
Admission: RE | Admit: 2011-07-12 | Discharge: 2011-07-12 | Disposition: A | Discharge status: Home / Self Care | Payer: Medicare Other | Source: Ambulatory Visit | Attending: Cardiovascular Disease | Admitting: Cardiovascular Disease

## 2011-07-12 DIAGNOSIS — Z9079 Acquired absence of other genital organ(s): Secondary | ICD-10-CM | POA: Insufficient documentation

## 2011-07-12 DIAGNOSIS — I10 Essential (primary) hypertension: Secondary | ICD-10-CM | POA: Insufficient documentation

## 2011-07-12 DIAGNOSIS — I359 Nonrheumatic aortic valve disorder, unspecified: Secondary | ICD-10-CM | POA: Insufficient documentation

## 2011-07-12 DIAGNOSIS — Z7982 Long term (current) use of aspirin: Secondary | ICD-10-CM | POA: Insufficient documentation

## 2011-07-12 DIAGNOSIS — Z9889 Other specified postprocedural states: Secondary | ICD-10-CM | POA: Insufficient documentation

## 2011-07-12 DIAGNOSIS — E785 Hyperlipidemia, unspecified: Secondary | ICD-10-CM | POA: Insufficient documentation

## 2011-07-12 DIAGNOSIS — Z5189 Encounter for other specified aftercare: Secondary | ICD-10-CM | POA: Insufficient documentation

## 2011-07-12 DIAGNOSIS — Z90710 Acquired absence of both cervix and uterus: Secondary | ICD-10-CM | POA: Insufficient documentation

## 2011-07-12 DIAGNOSIS — Z87891 Personal history of nicotine dependence: Secondary | ICD-10-CM | POA: Insufficient documentation

## 2011-07-12 NOTE — Progress Notes (Signed)
Cardiac Rehab Medication Review  Does the patient  feel that his/her medications are working for him/her?  yes  Has the patient been experiencing any side effects to the medications prescribed?  no  Does the patient measure his/her own blood pressure or blood glucose at home?  yes   Does the patient have any problems obtaining medications due to transportation or finances?   no  Understanding of regimen: excellent Understanding of indications: good Potential of compliance: excellent  Pharmacist comments: Patient takes her blood pressure at home about three times a day.    Janace Litten, PharmD 07/12/2011 8:26 AM

## 2011-07-16 ENCOUNTER — Encounter (HOSPITAL_COMMUNITY)
Admission: RE | Admit: 2011-07-16 | Discharge: 2011-07-16 | Disposition: A | Discharge status: Home / Self Care | Payer: Medicare Other | Source: Ambulatory Visit | Attending: Cardiovascular Disease | Admitting: Cardiovascular Disease

## 2011-07-16 ENCOUNTER — Encounter (HOSPITAL_COMMUNITY): Payer: Self-pay

## 2011-07-16 DIAGNOSIS — E785 Hyperlipidemia, unspecified: Secondary | ICD-10-CM | POA: Diagnosis not present

## 2011-07-16 DIAGNOSIS — I10 Essential (primary) hypertension: Secondary | ICD-10-CM | POA: Diagnosis not present

## 2011-07-16 DIAGNOSIS — Z90710 Acquired absence of both cervix and uterus: Secondary | ICD-10-CM | POA: Diagnosis not present

## 2011-07-16 DIAGNOSIS — Z5189 Encounter for other specified aftercare: Secondary | ICD-10-CM | POA: Diagnosis not present

## 2011-07-16 DIAGNOSIS — Z9079 Acquired absence of other genital organ(s): Secondary | ICD-10-CM | POA: Diagnosis not present

## 2011-07-16 DIAGNOSIS — Z9889 Other specified postprocedural states: Secondary | ICD-10-CM | POA: Diagnosis not present

## 2011-07-16 DIAGNOSIS — I359 Nonrheumatic aortic valve disorder, unspecified: Secondary | ICD-10-CM | POA: Diagnosis not present

## 2011-07-16 DIAGNOSIS — Z87891 Personal history of nicotine dependence: Secondary | ICD-10-CM | POA: Diagnosis not present

## 2011-07-16 DIAGNOSIS — Z7982 Long term (current) use of aspirin: Secondary | ICD-10-CM | POA: Diagnosis not present

## 2011-07-16 NOTE — Progress Notes (Signed)
Pt started cardiac rehab today.  Pt tolerated light exercise without difficulty.  VSS. Telemetry-NST with occasional PVC.  Pt asymptomatic. Will forward rhythm strip to Dr. Excell Seltzer for review.  Pt denies chest pain, dyspnea or dizziness with exercise.  Pt oriented to exercise equipment and routine.  Understanding verbalized.  Will continue to monitor the patient throughout  the program.

## 2011-07-18 ENCOUNTER — Encounter (HOSPITAL_COMMUNITY)
Admission: RE | Admit: 2011-07-18 | Discharge: 2011-07-18 | Disposition: A | Discharge status: Home / Self Care | Payer: Medicare Other | Source: Ambulatory Visit | Attending: Cardiovascular Disease | Admitting: Cardiovascular Disease

## 2011-07-18 NOTE — Progress Notes (Signed)
[name] 78 y.o. female       Nutrition Screen                                                                    YES  NO Do you live in a nursing home?  x   Do you eat out more than 3 times/week?    x If yes, how many times per week do you eat out?  Do you have food allergies?   x If yes, what are you allergic to?  Have you gained or lost more than 10 lbs without trying?               x If yes, how much weight have you lost and over what time period?  lbs gained or lost over  weeks/month  Do you want to lose weight?    x  If yes, what is a goal weight or amount of weight you would like to lose? 25 lb  Do you eat alone most of the time?   x   Do you eat less than 2 meals/day?  x If yes, how many meals do you eat?  Do you drink more than 3 alcohol drinks/day?  x If yes, how many drinks per day?  Are you having trouble with constipation? *  x If yes, what are you doing to help relieve constipation?  Do you have financial difficulties with buying food?*    x   Are you experiencing regular nausea/ vomiting?*     x   Do you have a poor appetite? *                                        x   Do you have trouble chewing/swallowing? *   x    Pt with diagnoses of:  X AVR/MVR/ICD      X Dyslipidemia  / HDL< 40 / LDL>70 / High TG      X %  Body fat >goal / Body Mass Index >25 X HTN / BP >120/80       Pt Risk Score   1       Diagnosis Risk Score  20       Total Risk Score   21                         High Risk               X Low Risk    HT: 65.2" Ht Readings from Last 1 Encounters:  07/12/11 5' 5.2" (1.656 m)    WT:   188.5 lb (85.7 kg) Wt Readings from Last 3 Encounters:  07/12/11 188 lb 15 oz (85.7 kg)  07/10/11 184 lb (83.462 kg)  07/03/11 185 lb (83.915 kg)     IBW 57.3 150%IBW BMI 31.2 25.7%body fat  Meds reviewed: Hydrochlorothiazide, Potassium Chloride, Vitamin D, Fish oil Past Medical History  Diagnosis Date  . Family history of colonic polyps   . Other abnormal  glucose   . Pain in joint, shoulder region   . Unspecified hearing loss   .  Aortic stenosis     s/p tissue AVR 05/2011  . Alcohol abuse, unspecified   . Dyskinesia of esophagus   . Diaphragmatic hernia without mention of obstruction or gangrene   . Tobacco use disorder   . Internal hemorrhoids without mention of complication   . Diverticulosis of colon (without mention of hemorrhage)   . Postmenopausal bleeding   . HTN (hypertension)   . HLD (hyperlipidemia)   . Malignant neoplasm of corpus uteri, except isthmus   . Breast CA     rt  . Arthritis        Activity level: Pt is moderately active  Wt goal: 164-176 lb ( 74.5-80 kg) Current tobacco use? No      Food/Drug Interaction? No       Labs:  Lipid Panel     Component Value Date/Time   CHOL 133 03/22/2011 0828   TRIG 53.0 03/22/2011 0828   HDL 50.40 03/22/2011 0828   CHOLHDL 3 03/22/2011 0828   VLDL 10.6 03/22/2011 0828   LDLCALC 72 03/22/2011 0828   Lab Results  Component Value Date   HGBA1C 5.6 06/11/2011   03/22/11 Glucose 112 LDL goal: < 100      MI, DM, Carotid or PVD and > 2:      HTN, >76 yo female Estimated Daily Nutrition Needs for: ? wt loss  1300-1550 Kcal , Total Fat 35-40gm, Saturated Fat 9-12 gm, Trans Fat 1.2-1.5 gm,  Sodium less than 1500 mg

## 2011-07-20 ENCOUNTER — Encounter (HOSPITAL_COMMUNITY)
Admission: RE | Admit: 2011-07-20 | Discharge: 2011-07-20 | Disposition: A | Discharge status: Home / Self Care | Payer: Medicare Other | Source: Ambulatory Visit | Attending: Cardiovascular Disease | Admitting: Cardiovascular Disease

## 2011-07-23 ENCOUNTER — Encounter (HOSPITAL_COMMUNITY)
Admission: RE | Admit: 2011-07-23 | Discharge: 2011-07-23 | Disposition: A | Discharge status: Home / Self Care | Payer: Medicare Other | Source: Ambulatory Visit | Attending: Cardiovascular Disease | Admitting: Cardiovascular Disease

## 2011-07-23 NOTE — Progress Notes (Signed)
[name] [place] 78 y.o. female Nutrition Note Spoke with pt.  Nutrition Plan and Nutrition Survey reviewed with pt. Pt is following Step 2 of the Therapeutic Lifestyle Changes diet. Pt wants to lose 25 lbs. Pt has previously tried the [employer]. Pt states, "it worked, I lost wt, but I didn't feel like it was healthy." Healthy weight loss tips discussed.  Pt expressed understanding.  Pt is statin intolerant.  Increasing soluble fiber and possibly adding plant stanol/sterol esters to diet to help with lipid control discussed. Pt encouraged to consume more of a plant-based/ vegetarian diet if she wanted to limit saturated fat/dietary cholesterol further.   Nutrition Diagnosis   Food-and nutrition-related knowledge deficit related to lack of exposure to information as related to diagnosis of: ? CVD   Obesity related to excessive energy intake as evidenced by a BMI of 31.2  Nutrition RX/ Estimated Daily Nutrition Needs for: wt loss  1300-1550 Kcal, 35-40 gm fat, 9-12 gm sat fat, 1.2-1.5 gm trans-fat, <1500 mg sodium  Nutrition Intervention   Pt's individual nutrition plan including cholesterol goals reviewed with pt.   Benefits of adopting Therapeutic Lifestyle Changes discussed when [employer] reviewed.   Pt to attend the Portion Distortion class   Pt to attend the  ? Nutrition I class                          ? Nutrition II class   Pt given handouts for: ? wt loss ? Heart Healthy Eating: Fiber ? Heart Healthy Eating: Sterol & Stanol Tips   Continue client-centered nutrition education by RD, as part of interdisciplinary care. Goal(s)   Pt to identify food quantities necessary to achieve: ? wt loss to a goal wt of 164-176( 74.5-80 kg) at graduation from cardiac rehab.  Monitor and Evaluate progress toward nutrition goal with team.

## 2011-07-25 ENCOUNTER — Encounter (HOSPITAL_COMMUNITY)
Admission: RE | Admit: 2011-07-25 | Discharge: 2011-07-25 | Disposition: A | Discharge status: Home / Self Care | Payer: Medicare Other | Source: Ambulatory Visit | Attending: Cardiovascular Disease | Admitting: Cardiovascular Disease

## 2011-07-27 ENCOUNTER — Encounter (HOSPITAL_COMMUNITY)
Admission: RE | Admit: 2011-07-27 | Discharge: 2011-07-27 | Disposition: A | Discharge status: Home / Self Care | Payer: Medicare Other | Source: Ambulatory Visit | Attending: Cardiovascular Disease | Admitting: Cardiovascular Disease

## 2011-07-27 DIAGNOSIS — Z9889 Other specified postprocedural states: Secondary | ICD-10-CM | POA: Diagnosis not present

## 2011-07-27 DIAGNOSIS — Z87891 Personal history of nicotine dependence: Secondary | ICD-10-CM | POA: Diagnosis not present

## 2011-07-27 DIAGNOSIS — Z5189 Encounter for other specified aftercare: Secondary | ICD-10-CM | POA: Insufficient documentation

## 2011-07-27 DIAGNOSIS — I359 Nonrheumatic aortic valve disorder, unspecified: Secondary | ICD-10-CM | POA: Insufficient documentation

## 2011-07-27 DIAGNOSIS — E785 Hyperlipidemia, unspecified: Secondary | ICD-10-CM | POA: Diagnosis not present

## 2011-07-27 DIAGNOSIS — Z9079 Acquired absence of other genital organ(s): Secondary | ICD-10-CM | POA: Diagnosis not present

## 2011-07-27 DIAGNOSIS — I10 Essential (primary) hypertension: Secondary | ICD-10-CM | POA: Insufficient documentation

## 2011-07-27 DIAGNOSIS — Z90710 Acquired absence of both cervix and uterus: Secondary | ICD-10-CM | POA: Insufficient documentation

## 2011-07-27 DIAGNOSIS — Z7982 Long term (current) use of aspirin: Secondary | ICD-10-CM | POA: Diagnosis not present

## 2011-07-27 NOTE — Progress Notes (Signed)
Reviewed home exercise with pt today.  Pt plans to walk at home for exercise.  Pt asked about returning to [employer] circuit training routine. I advised pt not to return to that program until we have monitored her as we progressively increase her exercise intensity here in cardiac rehab. Pt voiced understanding to not yet returning to that program. Reviewed THR, pulse, RPE, sign and symptoms, and when to call 911 or MD.  Pt voiced understanding. Electronically signed by Harriett Sine MS on Friday July 27 2011 at [phone]

## 2011-07-30 ENCOUNTER — Encounter (HOSPITAL_COMMUNITY): Payer: Medicare Other

## 2011-08-01 ENCOUNTER — Encounter (HOSPITAL_COMMUNITY): Payer: Medicare Other

## 2011-08-03 ENCOUNTER — Encounter (HOSPITAL_COMMUNITY)
Admission: RE | Admit: 2011-08-03 | Discharge: 2011-08-03 | Disposition: A | Discharge status: Home / Self Care | Payer: Medicare Other | Source: Ambulatory Visit | Attending: Cardiovascular Disease | Admitting: Cardiovascular Disease

## 2011-08-06 ENCOUNTER — Encounter (HOSPITAL_COMMUNITY)
Admission: RE | Admit: 2011-08-06 | Discharge: 2011-08-06 | Disposition: A | Discharge status: Home / Self Care | Payer: Medicare Other | Source: Ambulatory Visit | Attending: Cardiovascular Disease | Admitting: Cardiovascular Disease

## 2011-08-08 ENCOUNTER — Encounter (HOSPITAL_COMMUNITY)
Admission: RE | Admit: 2011-08-08 | Discharge: 2011-08-08 | Disposition: A | Discharge status: Home / Self Care | Payer: Medicare Other | Source: Ambulatory Visit | Attending: Cardiovascular Disease | Admitting: Cardiovascular Disease

## 2011-08-10 ENCOUNTER — Encounter (HOSPITAL_COMMUNITY)
Admission: RE | Admit: 2011-08-10 | Discharge: 2011-08-10 | Disposition: A | Discharge status: Home / Self Care | Payer: Medicare Other | Source: Ambulatory Visit | Attending: Cardiovascular Disease | Admitting: Cardiovascular Disease

## 2011-08-13 ENCOUNTER — Encounter (HOSPITAL_COMMUNITY)
Admission: RE | Admit: 2011-08-13 | Discharge: 2011-08-13 | Disposition: A | Discharge status: Home / Self Care | Payer: Medicare Other | Source: Ambulatory Visit | Attending: Cardiovascular Disease | Admitting: Cardiovascular Disease

## 2011-08-15 ENCOUNTER — Encounter (HOSPITAL_COMMUNITY)
Admission: RE | Admit: 2011-08-15 | Discharge: 2011-08-15 | Disposition: A | Discharge status: Home / Self Care | Payer: Medicare Other | Source: Ambulatory Visit | Attending: Cardiovascular Disease | Admitting: Cardiovascular Disease

## 2011-08-17 ENCOUNTER — Encounter (HOSPITAL_COMMUNITY)
Admission: RE | Admit: 2011-08-17 | Discharge: 2011-08-17 | Disposition: A | Discharge status: Home / Self Care | Payer: Medicare Other | Source: Ambulatory Visit | Attending: Cardiovascular Disease | Admitting: Cardiovascular Disease

## 2011-08-20 ENCOUNTER — Encounter (HOSPITAL_COMMUNITY)
Admission: RE | Admit: 2011-08-20 | Discharge: 2011-08-20 | Disposition: A | Discharge status: Home / Self Care | Payer: Medicare Other | Source: Ambulatory Visit | Attending: Cardiovascular Disease | Admitting: Cardiovascular Disease

## 2011-08-21 ENCOUNTER — Encounter: Payer: Self-pay | Admitting: Cardiovascular Disease

## 2011-08-21 ENCOUNTER — Ambulatory Visit (INDEPENDENT_AMBULATORY_CARE_PROVIDER_SITE_OTHER): Payer: Medicare Other | Admitting: Cardiovascular Disease

## 2011-08-21 VITALS — BP 138/74 | HR 72 | Ht 65.5 in | Wt 187.8 lb

## 2011-08-21 DIAGNOSIS — I359 Nonrheumatic aortic valve disorder, unspecified: Secondary | ICD-10-CM | POA: Diagnosis not present

## 2011-08-21 DIAGNOSIS — I10 Essential (primary) hypertension: Secondary | ICD-10-CM

## 2011-08-21 MED ORDER — CARVEDILOL 25 MG PO TABS: 25.0000 mg | ORAL_TABLET | Freq: Two times a day (BID) | ORAL | Status: DC

## 2011-08-21 NOTE — Assessment & Plan Note (Signed)
History of severe symptomatic aortic stenosis now status post bioprosthetic aVR. She is doing well and will continue current medical therapy. She was encouraged to continue with her exercise program. I will see her back in 3 months for followup and minimal spread out followup from there forward.

## 2011-08-21 NOTE — Progress Notes (Signed)
HPI:  78 year old woman presenting for followup of aortic stenosis status post bioprosthetic aortic valve replacement.  The patient had severe symptomatic aortic stenosis and underwent surgical aVR with a 21 mm pericardial valve on January 16. She had a noneventful postoperative course and feels better than she has felt in a long time. Her endurance is improved and she denies exertional chest pain or dyspnea. She denies palpitations, lightheadedness, or syncope. She has chronic leg edema related to venous insufficiency and she is a little disappointed this didn't improve after her heart surgery. Leg swelling progresses throughout the day. She has very little residual soreness in her chest and feels that surgery went much easier than she anticipated.  A postoperative echo showed normal LV function with an ejection fraction of 60-65% with a normal functioning aortic bioprosthesis and a mean gradient of 15 mm mercury and peak gradient of 21 mm mercury.  Outpatient Encounter Prescriptions as of 08/21/2011  Medication Sig Dispense Refill  . aspirin 325 MG tablet Take 325 mg by mouth. Take 1/2 tablet daily      . carvedilol (COREG) 25 MG tablet Take 1 tablet (25 mg total) by mouth 2 (two) times daily with a meal.  180 tablet  3  . Cholecalciferol (VITAMIN D) 1000 UNITS capsule Take 1,000 Units by mouth 2 (two) times daily.       . hydrochlorothiazide (HYDRODIURIL) 25 MG tablet Take 25 mg by mouth once.       . Omega-3 Fatty Acids (FISH OIL) 1000 MG CAPS Take 2,000 mg by mouth 2 (two) times daily.       . potassium chloride SA (K-DUR,KLOR-CON) 20 MEQ tablet TAKE 1 TABLET TWICE A DAY  180 tablet  2  . DISCONTD: carvedilol (COREG) 25 MG tablet Take 25 mg by mouth 2 (two) times daily with a meal.      . DISCONTD: carvedilol (COREG) 25 MG tablet Take 1 tablet (25 mg total) by mouth 2 (two) times daily with a meal.  90 tablet  3  . DISCONTD: carvedilol (COREG) 12.5 MG tablet Take 1 tablet (12.5 mg total) by mouth  2 (two) times daily with a meal.  60 tablet  1  . DISCONTD: oxyCODONE (OXY IR/ROXICODONE) 5 MG immediate release tablet Take by mouth. 1-2 tablets every 4 hours as needed for pain        Allergies  Allergen Reactions  . Calcium     REACTION: constipation  . Ezetimibe     REACTION: fatigue  . Ramipril     REACTION: abdominal pain  . Simvastatin     REACTION: leg pain and body aches  . Valsartan     REACTION: abdominal pain    Past Medical History  Diagnosis Date  . Family history of colonic polyps   . Other abnormal glucose   . Pain in joint, shoulder region   . Unspecified hearing loss   . Aortic stenosis     s/p tissue AVR 05/2011  . Alcohol abuse, unspecified   . Dyskinesia of esophagus   . Diaphragmatic hernia without mention of obstruction or gangrene   . Tobacco use disorder   . Internal hemorrhoids without mention of complication   . Diverticulosis of colon (without mention of hemorrhage)   . Postmenopausal bleeding   . HTN (hypertension)   . HLD (hyperlipidemia)   . Malignant neoplasm of corpus uteri, except isthmus   . Breast CA     rt  . Arthritis  ROS: Negative except as per HPI  BP 138/74  Pulse 72  Ht 5' 5.5" (1.664 m)  Wt 85.186 kg (187 lb 12.8 oz)  BMI 30.78 kg/m2  LMP 05/29/2007  PHYSICAL EXAM: Pt is alert and oriented, NAD HEENT: normal Neck: JVP - normal, carotids 2+= without bruits Lungs: CTA bilaterally CV: RRR with a grade 2/6 systolic ejection murmur at the left sternal border Abd: soft, NT, Positive BS, no hepatomegaly Ext: Mild pretibial edema bilaterally, distal pulses intact and equal Skin: warm/dry no rash  ASSESSMENT AND PLAN:

## 2011-08-21 NOTE — Assessment & Plan Note (Signed)
Controlled on current medical therapy with a combination of carvedilol and hydrochlorothiazide.

## 2011-08-21 NOTE — Patient Instructions (Signed)
Your physician recommends that you schedule a follow-up appointment in: 3 months. Your physician recommends that you continue on your current medications as directed. Please refer to the Current Medication list given to you today. 

## 2011-08-22 ENCOUNTER — Encounter (HOSPITAL_COMMUNITY)
Admission: RE | Admit: 2011-08-22 | Discharge: 2011-08-22 | Disposition: A | Discharge status: Home / Self Care | Payer: Medicare Other | Source: Ambulatory Visit | Attending: Cardiovascular Disease | Admitting: Cardiovascular Disease

## 2011-08-24 ENCOUNTER — Encounter (HOSPITAL_COMMUNITY)
Admission: RE | Admit: 2011-08-24 | Discharge: 2011-08-24 | Disposition: A | Discharge status: Home / Self Care | Payer: Medicare Other | Source: Ambulatory Visit | Attending: Cardiovascular Disease | Admitting: Cardiovascular Disease

## 2011-08-27 ENCOUNTER — Encounter (HOSPITAL_COMMUNITY)
Admission: RE | Admit: 2011-08-27 | Discharge: 2011-08-27 | Disposition: A | Discharge status: Home / Self Care | Payer: Medicare Other | Source: Ambulatory Visit | Attending: Cardiovascular Disease | Admitting: Cardiovascular Disease

## 2011-08-27 DIAGNOSIS — I10 Essential (primary) hypertension: Secondary | ICD-10-CM | POA: Insufficient documentation

## 2011-08-27 DIAGNOSIS — Z9079 Acquired absence of other genital organ(s): Secondary | ICD-10-CM | POA: Insufficient documentation

## 2011-08-27 DIAGNOSIS — E785 Hyperlipidemia, unspecified: Secondary | ICD-10-CM | POA: Insufficient documentation

## 2011-08-27 DIAGNOSIS — Z90710 Acquired absence of both cervix and uterus: Secondary | ICD-10-CM | POA: Insufficient documentation

## 2011-08-27 DIAGNOSIS — Z5189 Encounter for other specified aftercare: Secondary | ICD-10-CM | POA: Diagnosis not present

## 2011-08-27 DIAGNOSIS — Z87891 Personal history of nicotine dependence: Secondary | ICD-10-CM | POA: Insufficient documentation

## 2011-08-27 DIAGNOSIS — Z9889 Other specified postprocedural states: Secondary | ICD-10-CM | POA: Insufficient documentation

## 2011-08-27 DIAGNOSIS — I359 Nonrheumatic aortic valve disorder, unspecified: Secondary | ICD-10-CM | POA: Diagnosis not present

## 2011-08-27 DIAGNOSIS — Z7982 Long term (current) use of aspirin: Secondary | ICD-10-CM | POA: Insufficient documentation

## 2011-08-29 ENCOUNTER — Encounter (HOSPITAL_COMMUNITY)
Admission: RE | Admit: 2011-08-29 | Discharge: 2011-08-29 | Disposition: A | Discharge status: Home / Self Care | Payer: Medicare Other | Source: Ambulatory Visit | Attending: Cardiovascular Disease | Admitting: Cardiovascular Disease

## 2011-08-31 ENCOUNTER — Encounter (HOSPITAL_COMMUNITY)
Admission: RE | Admit: 2011-08-31 | Discharge: 2011-08-31 | Disposition: A | Discharge status: Home / Self Care | Payer: Medicare Other | Source: Ambulatory Visit | Attending: Cardiovascular Disease | Admitting: Cardiovascular Disease

## 2011-09-03 ENCOUNTER — Encounter (HOSPITAL_COMMUNITY)
Admission: RE | Admit: 2011-09-03 | Discharge: 2011-09-03 | Disposition: A | Discharge status: Home / Self Care | Payer: Medicare Other | Source: Ambulatory Visit | Attending: Cardiovascular Disease | Admitting: Cardiovascular Disease

## 2011-09-05 ENCOUNTER — Other Ambulatory Visit (INDEPENDENT_AMBULATORY_CARE_PROVIDER_SITE_OTHER): Payer: Medicare Other

## 2011-09-05 ENCOUNTER — Other Ambulatory Visit: Payer: Self-pay | Admitting: *Deleted

## 2011-09-05 ENCOUNTER — Encounter (HOSPITAL_COMMUNITY)
Admission: RE | Admit: 2011-09-05 | Discharge: 2011-09-05 | Payer: Medicare Other | Source: Ambulatory Visit | Attending: Cardiovascular Disease | Admitting: Cardiovascular Disease

## 2011-09-05 DIAGNOSIS — R531 Weakness: Secondary | ICD-10-CM

## 2011-09-05 DIAGNOSIS — R5383 Other fatigue: Secondary | ICD-10-CM

## 2011-09-05 DIAGNOSIS — R5381 Other malaise: Secondary | ICD-10-CM | POA: Diagnosis not present

## 2011-09-05 LAB — CBC WITH DIFFERENTIAL/PLATELET
Basophils Relative: 0.5 % (ref 0.0–3.0)
Eosinophils Absolute: 0 10*3/uL (ref 0.0–0.7)
MCHC: 33.7 g/dL (ref 30.0–36.0)
MCV: 83.9 fl (ref 78.0–100.0)
Monocytes Absolute: 0.6 10*3/uL (ref 0.1–1.0)
Neutrophils Relative %: 73.2 % (ref 43.0–77.0)
Platelets: 141 10*3/uL — ABNORMAL LOW (ref 150.0–400.0)

## 2011-09-05 LAB — BASIC METABOLIC PANEL
BUN: 16 mg/dL (ref 6–23)
Calcium: 9.7 mg/dL (ref 8.4–10.5)
GFR: 78.67 mL/min (ref >60.00)
Potassium: 3.7 mEq/L (ref 3.5–5.1)
Sodium: 141 mEq/L (ref 135–145)

## 2011-09-05 NOTE — Progress Notes (Signed)
Pt arrived to cardiac rehab reporting episode of weakness and lightheadedness associated with fall at home yesterday. Pt denies syncope or pain.  Pt states "I just felt weak all over."  Pt denies symptoms today. Pt reports  she  was doing usual activities in her home, however had only eaten fruit for lunch.  Orthostatic vital signs obtained...112/58 HR- 52 lying, 124/71, HR-55 sitting, 126/69, HR-58 standing.  pc to Dr. Earmon Phoenix office.  appt scheduled with Excell Seltzer on Friday 09/07/11 @3 :45.  Pt will go to his office today for BMP and CBC.  Pt informed to continue current regimen, however no strenuous activity until evaluated y MD.  Pt advised to contact Dr. Earmon Phoenix office is sx persist or worsen, present to ED for sudden unrelieved sx.  Understanding verbalized-jrion,rn

## 2011-09-07 ENCOUNTER — Encounter (HOSPITAL_COMMUNITY): Payer: Medicare Other

## 2011-09-07 ENCOUNTER — Encounter: Payer: Self-pay | Admitting: Cardiovascular Disease

## 2011-09-07 ENCOUNTER — Ambulatory Visit (INDEPENDENT_AMBULATORY_CARE_PROVIDER_SITE_OTHER): Payer: Medicare Other | Admitting: Cardiovascular Disease

## 2011-09-07 VITALS — BP 138/90 | HR 67 | Ht 65.0 in | Wt 185.0 lb

## 2011-09-07 DIAGNOSIS — I359 Nonrheumatic aortic valve disorder, unspecified: Secondary | ICD-10-CM

## 2011-09-07 DIAGNOSIS — R55 Syncope and collapse: Secondary | ICD-10-CM | POA: Diagnosis not present

## 2011-09-07 DIAGNOSIS — I350 Nonrheumatic aortic (valve) stenosis: Secondary | ICD-10-CM

## 2011-09-07 NOTE — Assessment & Plan Note (Signed)
The patient had a single episode of near syncope and now has a feeling of dizziness. I reviewed her orthostatic vital signs from cardiac rehabilitation and they are normal. She does not have clear orthostatic like symptoms his dizziness is precipitated by various head movements. She's had problems with vertigo in the past and this may be a similar condition. I recommended that she continue her current medicines and push fluids. She can continue with cardiac rehabilitation. She will followup with her primary care physician in the near future. I think her cardiac status is stable.

## 2011-09-07 NOTE — Progress Notes (Signed)
HPI:  78 year old woman presenting for followup evaluation. The patient has a history of severe aortic stenosis and underwent bioprosthetic aVR in January. She had an uneventful postoperative course. She has been doing well and has been participating regularly in cardiac rehabilitation. Earlier this week she had an episode of near syncope. She had been standing up and was in her closet after a hot shower. She felt weak all over and describes a feeling that her entire body "felt like Jell-O." She did not lose consciousness but was very weak and she had to lay down on the floor. She has not had chest pain, chest pressure, dyspnea, or palpitations. Since that episode she has felt dizzy on several occasions. This has occurred with positional changes as well as certain movements of her head. She's had mild nausea but no vomiting or diaphoresis. She has no other complaints.  Outpatient Encounter Prescriptions as of 09/07/2011  Medication Sig Dispense Refill  . aspirin 325 MG tablet Take 325 mg by mouth. Take 1/2 tablet daily      . carvedilol (COREG) 25 MG tablet Take 1 tablet (25 mg total) by mouth 2 (two) times daily with a meal.  180 tablet  3  . Cholecalciferol (VITAMIN D) 1000 UNITS capsule Take 1,000 Units by mouth 2 (two) times daily.       . hydrochlorothiazide (HYDRODIURIL) 25 MG tablet Take 25 mg by mouth once.       . Omega-3 Fatty Acids (FISH OIL) 1000 MG CAPS Take 2,000 mg by mouth 2 (two) times daily.       . potassium chloride SA (K-DUR,KLOR-CON) 20 MEQ tablet TAKE 1 TABLET TWICE A DAY  180 tablet  2    Allergies  Allergen Reactions  . Calcium     REACTION: constipation  . Ezetimibe     REACTION: fatigue  . Ramipril     REACTION: abdominal pain  . Simvastatin     REACTION: leg pain and body aches  . Valsartan     REACTION: abdominal pain    Past Medical History  Diagnosis Date  . Family history of colonic polyps   . Other abnormal glucose   . Pain in joint, shoulder region     . Unspecified hearing loss   . Aortic stenosis     s/p tissue AVR 05/2011  . Alcohol abuse, unspecified   . Dyskinesia of esophagus   . Diaphragmatic hernia without mention of obstruction or gangrene   . Tobacco use disorder   . Internal hemorrhoids without mention of complication   . Diverticulosis of colon (without mention of hemorrhage)   . Postmenopausal bleeding   . HTN (hypertension)   . HLD (hyperlipidemia)   . Malignant neoplasm of corpus uteri, except isthmus   . Breast CA     rt  . Arthritis     ROS: Negative except as per HPI  BP 138/90  Pulse 67  Ht 5\' 5"  (1.651 m)  Wt 83.915 kg (185 lb)  BMI 30.79 kg/m2  LMP 05/29/2007  PHYSICAL EXAM: Pt is alert and oriented, NAD HEENT: normal Neck: JVP - normal, carotids 2+= without bruits Lungs: CTA bilaterally CV: RRR with grade 2-3/6 systolic murmur at the left upper sternal border Abd: soft, NT, Positive BS, no hepatomegaly Ext: Trace pretibial edema bilaterally, distal pulses intact and equal Skin: warm/dry no rash  EKG:  Normal sinus rhythm 67 beats per minute, left axis deviation, left ventricular hypertrophy with repolarization abnormality.  ASSESSMENT AND PLAN:

## 2011-09-07 NOTE — Patient Instructions (Signed)
Please keep your scheduled follow-up with Dr Excell Seltzer.

## 2011-09-10 ENCOUNTER — Encounter (HOSPITAL_COMMUNITY)
Admission: RE | Admit: 2011-09-10 | Discharge: 2011-09-10 | Disposition: A | Discharge status: Home / Self Care | Payer: Medicare Other | Source: Ambulatory Visit | Attending: Cardiovascular Disease | Admitting: Cardiovascular Disease

## 2011-09-12 ENCOUNTER — Encounter (HOSPITAL_COMMUNITY)
Admission: RE | Admit: 2011-09-12 | Discharge: 2011-09-12 | Disposition: A | Discharge status: Home / Self Care | Payer: Medicare Other | Source: Ambulatory Visit | Attending: Cardiovascular Disease | Admitting: Cardiovascular Disease

## 2011-09-14 ENCOUNTER — Encounter (HOSPITAL_COMMUNITY)
Admission: RE | Admit: 2011-09-14 | Discharge: 2011-09-14 | Disposition: A | Discharge status: Home / Self Care | Payer: Medicare Other | Source: Ambulatory Visit | Attending: Cardiovascular Disease | Admitting: Cardiovascular Disease

## 2011-09-17 ENCOUNTER — Telehealth: Payer: Self-pay | Admitting: Family Medicine

## 2011-09-17 ENCOUNTER — Encounter (HOSPITAL_COMMUNITY)
Admission: RE | Admit: 2011-09-17 | Discharge: 2011-09-17 | Disposition: A | Discharge status: Home / Self Care | Payer: Medicare Other | Source: Ambulatory Visit | Attending: Cardiovascular Disease | Admitting: Cardiovascular Disease

## 2011-09-17 DIAGNOSIS — I10 Essential (primary) hypertension: Secondary | ICD-10-CM

## 2011-09-17 DIAGNOSIS — E559 Vitamin D deficiency, unspecified: Secondary | ICD-10-CM

## 2011-09-17 DIAGNOSIS — M899 Disorder of bone, unspecified: Secondary | ICD-10-CM

## 2011-09-17 DIAGNOSIS — E785 Hyperlipidemia, unspecified: Secondary | ICD-10-CM

## 2011-09-17 DIAGNOSIS — R7309 Other abnormal glucose: Secondary | ICD-10-CM

## 2011-09-17 NOTE — Telephone Encounter (Signed)
Message copied by Judy Pimple on Mon Sep 17, 2011  9:05 PM ------      Message from: Baldomero Lamy      Created: Mon Sep 10, 2011  9:50 AM      Regarding: Cpx labs Tues 4/23       Please order  future cpx labs for pt's upcomming lab appt.      Thanks      Rodney Booze

## 2011-09-18 ENCOUNTER — Other Ambulatory Visit (INDEPENDENT_AMBULATORY_CARE_PROVIDER_SITE_OTHER): Payer: Medicare Other

## 2011-09-18 DIAGNOSIS — I10 Essential (primary) hypertension: Secondary | ICD-10-CM | POA: Diagnosis not present

## 2011-09-18 DIAGNOSIS — M899 Disorder of bone, unspecified: Secondary | ICD-10-CM

## 2011-09-18 DIAGNOSIS — R7309 Other abnormal glucose: Secondary | ICD-10-CM | POA: Diagnosis not present

## 2011-09-18 DIAGNOSIS — E559 Vitamin D deficiency, unspecified: Secondary | ICD-10-CM | POA: Diagnosis not present

## 2011-09-18 DIAGNOSIS — E785 Hyperlipidemia, unspecified: Secondary | ICD-10-CM

## 2011-09-18 LAB — HEMOGLOBIN A1C: Hgb A1c MFr Bld: 5.7 % (ref 4.6–6.5)

## 2011-09-18 LAB — COMPREHENSIVE METABOLIC PANEL
Albumin: 4 g/dL (ref 3.5–5.2)
CO2: 30 mEq/L (ref 19–32)
Calcium: 9.6 mg/dL (ref 8.4–10.5)
Chloride: 104 mEq/L (ref 96–112)
GFR: 69.13 mL/min (ref >60.00)
Glucose, Bld: 104 mg/dL — ABNORMAL HIGH (ref 70–99)
Potassium: 4.1 mEq/L (ref 3.5–5.1)
Sodium: 141 mEq/L (ref 135–145)
Total Bilirubin: 0.2 mg/dL — ABNORMAL LOW (ref 0.3–1.2)
Total Protein: 6.6 g/dL (ref 6.0–8.3)

## 2011-09-18 LAB — TSH: TSH: 1.57 u[IU]/mL (ref 0.35–5.50)

## 2011-09-18 LAB — CBC WITH DIFFERENTIAL/PLATELET
Eosinophils Relative: 0 % (ref 0.0–5.0)
HCT: 41.3 % (ref 36.0–46.0)
Lymphocytes Relative: 19.9 % (ref 12.0–46.0)
Lymphs Abs: 0.9 10*3/uL (ref 0.7–4.0)
Monocytes Relative: 8.2 % (ref 3.0–12.0)
Platelets: 128 10*3/uL — ABNORMAL LOW (ref 150.0–400.0)
WBC: 4.5 10*3/uL (ref 4.5–10.5)

## 2011-09-18 LAB — LIPID PANEL: Total CHOL/HDL Ratio: 3

## 2011-09-19 ENCOUNTER — Encounter (HOSPITAL_COMMUNITY)
Admission: RE | Admit: 2011-09-19 | Discharge: 2011-09-19 | Disposition: A | Discharge status: Home / Self Care | Payer: Medicare Other | Source: Ambulatory Visit | Attending: Cardiovascular Disease | Admitting: Cardiovascular Disease

## 2011-09-19 LAB — VITAMIN D 25 HYDROXY (VIT D DEFICIENCY, FRACTURES): Vit D, 25-Hydroxy: 60 ng/mL (ref 30–89)

## 2011-09-20 ENCOUNTER — Other Ambulatory Visit (HOSPITAL_COMMUNITY)
Admission: RE | Admit: 2011-09-20 | Discharge: 2011-09-20 | Disposition: A | Discharge status: Home / Self Care | Payer: Medicare Other | Source: Ambulatory Visit | Attending: Family Medicine | Admitting: Family Medicine

## 2011-09-20 ENCOUNTER — Ambulatory Visit (INDEPENDENT_AMBULATORY_CARE_PROVIDER_SITE_OTHER): Payer: Medicare Other | Admitting: Family Medicine

## 2011-09-20 ENCOUNTER — Encounter: Payer: Self-pay | Admitting: Family Medicine

## 2011-09-20 VITALS — BP 123/74 | HR 64 | Ht 65.0 in | Wt 184.0 lb

## 2011-09-20 DIAGNOSIS — Z124 Encounter for screening for malignant neoplasm of cervix: Secondary | ICD-10-CM | POA: Diagnosis not present

## 2011-09-20 DIAGNOSIS — R0989 Other specified symptoms and signs involving the circulatory and respiratory systems: Secondary | ICD-10-CM

## 2011-09-20 DIAGNOSIS — C549 Malignant neoplasm of corpus uteri, unspecified: Secondary | ICD-10-CM

## 2011-09-20 DIAGNOSIS — Z1159 Encounter for screening for other viral diseases: Secondary | ICD-10-CM | POA: Insufficient documentation

## 2011-09-20 DIAGNOSIS — I359 Nonrheumatic aortic valve disorder, unspecified: Secondary | ICD-10-CM

## 2011-09-20 DIAGNOSIS — C50919 Malignant neoplasm of unspecified site of unspecified female breast: Secondary | ICD-10-CM | POA: Diagnosis not present

## 2011-09-20 DIAGNOSIS — C50511 Malignant neoplasm of lower-outer quadrant of right female breast: Secondary | ICD-10-CM | POA: Insufficient documentation

## 2011-09-20 NOTE — Progress Notes (Signed)
  Subjective:     [name] is a 78 y.o. female and is here for a comprehensive physical exam. The patient reports no problems.  She is s/p TAH/BSO and lymphadenectomy for endometrial cancer.   Last saw Dr. Josephine Cables in October.  Gets q 6 months paps, have been neg.  No bleeding.  Had Br CA and is s/p lumpectomy and XRT.  Last mammo was last year.  Sees Drs. Derrell Lolling and Welton Flakes soon.  Notes no new breast lesions.  Is still considering BRCA or Lynch testing in pt. With 2 separate primary cancers.  She is s/p aortic valve replacement earlier this year and is in cardiac rehab which is going well.   History   Social History  . Marital Status: Married    Spouse Name: N/A    Number of Children: N/A  . Years of Education: N/A   Occupational History  . Not on file.   Social History Main Topics  . Smoking status: Former Smoker -- 1.0 packs/day    Quit date: 03/28/2000  . Smokeless tobacco: Not on file  . Alcohol Use: No  . Drug Use: No  . Sexually Active: No   Other Topics Concern  . Not on file   Social History Narrative  . No narrative on file   Health Maintenance  Topic Date Due  . Influenza Vaccine  02/26/2012  . Colonoscopy  08/07/2018  . Tetanus/tdap  03/09/2019  . Pneumococcal Polysaccharide Vaccine Age 42 And Over  Completed  . Zostavax  Completed    The following portions of the patient's history were reviewed and updated as appropriate: allergies, current medications, past family history, past medical history, past social history, past surgical history and problem list.  Review of Systems A comprehensive review of systems was negative.   Objective:    BP 123/74  Pulse 64  Ht 5\' 5"  (1.651 m)  Wt 184 lb (83.462 kg)  BMI 30.62 kg/m2  LMP 05/29/2007 General appearance: alert, cooperative and appears stated age Neck: supple, symmetrical, trachea midline and thyroid not enlarged, symmetric, no tenderness/mass/nodules Lungs: clear to auscultation bilaterally Breasts:  negative findings: normal contour with no evidence of flattening or dimpling, skin normal, palpation negative for masses or nodules, no palpable axillary lymphadenopathy, positive findings: firm and fixed nodule located on the right just under scar, ? scar tissue from surgery and XRT  Heart: regular rate and rhythm and systolic murmur: holosystolic 2/6, blowing throughout the precordium Abdomen: soft, non-tender; bowel sounds normal; no masses,  no organomegaly Pelvic: no adnexal masses or tenderness, no bladder tenderness, uterus surgically absent, vagina normal without discharge and no mass at top of vagina noted. Extremities: edema 1+ Pulses: 2+ and symmetric Skin: Skin color, texture, turgor normal. No rashes or lesions Lymph nodes: Cervical, supraclavicular, and axillary nodes normal. Neurologic: Grossly normal    Assessment:    Annual female exam.  H/o endometrial Ca.  Multiple other medical problems currently under care by other MD's.     Plan:  Pap smear today. F/u 1 year. Keep regularly scheduled appt. With other providers.   See After Visit Summary for Counseling Recommendations

## 2011-09-20 NOTE — Patient Instructions (Signed)
Preventive Care for Adults, Female A healthy lifestyle and preventive care can promote health and wellness. Preventive health guidelines for women include the following key practices.  A routine yearly physical is a good way to check with your caregiver about your health and preventive screening. It is a chance to share any concerns and updates on your health, and to receive a thorough exam.   Visit your dentist for a routine exam and preventive care every 6 months. Brush your teeth twice a day and floss once a day. Good oral hygiene prevents tooth decay and gum disease.   The frequency of eye exams is based on your age, health, family medical history, use of contact lenses, and other factors. Follow your caregiver's recommendations for frequency of eye exams.   Eat a healthy diet. Foods like vegetables, fruits, whole grains, low-fat dairy products, and lean protein foods contain the nutrients you need without too many calories. Decrease your intake of foods high in solid fats, added sugars, and salt. Eat the right amount of calories for you.Get information about a proper diet from your caregiver, if necessary.   Regular physical exercise is one of the most important things you can do for your health. Most adults should get at least 150 minutes of moderate-intensity exercise (any activity that increases your heart rate and causes you to sweat) each week. In addition, most adults need muscle-strengthening exercises on 2 or more days a week.   Maintain a healthy weight. The body mass index (BMI) is a screening tool to identify possible weight problems. It provides an estimate of body fat based on height and weight. Your caregiver can help determine your BMI, and can help you achieve or maintain a healthy weight.For adults 20 years and older:   A BMI below 18.5 is considered underweight.   A BMI of 18.5 to 24.9 is normal.   A BMI of 25 to 29.9 is considered overweight.   A BMI of 30 and above is  considered obese.   Maintain normal blood lipids and cholesterol levels by exercising and minimizing your intake of saturated fat. Eat a balanced diet with plenty of fruit and vegetables. Blood tests for lipids and cholesterol should begin at age 20 and be repeated every 5 years. If your lipid or cholesterol levels are high, you are over 50, or you are at high risk for heart disease, you may need your cholesterol levels checked more frequently.Ongoing high lipid and cholesterol levels should be treated with medicines if diet and exercise are not effective.   If you smoke, find out from your caregiver how to quit. If you do not use tobacco, do not start.   If you are pregnant, do not drink alcohol. If you are breastfeeding, be very cautious about drinking alcohol. If you are not pregnant and choose to drink alcohol, do not exceed 1 drink per day. One drink is considered to be 12 ounces (355 mL) of beer, 5 ounces (148 mL) of wine, or 1.5 ounces (44 mL) of liquor.   Avoid use of street drugs. Do not share needles with anyone. Ask for help if you need support or instructions about stopping the use of drugs.   High blood pressure causes heart disease and increases the risk of stroke. Your blood pressure should be checked at least every 1 to 2 years. Ongoing high blood pressure should be treated with medicines if weight loss and exercise are not effective.   If you are 55 to 76   years old, ask your caregiver if you should take aspirin to prevent strokes.   Diabetes screening involves taking a blood sample to check your fasting blood sugar level. This should be done once every 3 years, after age 45, if you are within normal weight and without risk factors for diabetes. Testing should be considered at a younger age or be carried out more frequently if you are overweight and have at least 1 risk factor for diabetes.   Breast cancer screening is essential preventive care for women. You should practice "breast  self-awareness." This means understanding the normal appearance and feel of your breasts and may include breast self-examination. Any changes detected, no matter how small, should be reported to a caregiver. Women in their 20s and 30s should have a clinical breast exam (CBE) by a caregiver as part of a regular health exam every 1 to 3 years. After age 40, women should have a CBE every year. Starting at age 40, women should consider having a mammography (breast X-ray test) every year. Women who have a family history of breast cancer should talk to their caregiver about genetic screening. Women at a high risk of breast cancer should talk to their caregivers about having magnetic resonance imaging (MRI) and a mammography every year.   The Pap test is a screening test for cervical cancer. A Pap test can show cell changes on the cervix that might become cervical cancer if left untreated. A Pap test is a procedure in which cells are obtained and examined from the lower end of the uterus (cervix).   Women should have a Pap test starting at age 21.   Between ages 21 and 29, Pap tests should be repeated every 2 years.   Beginning at age 30, you should have a Pap test every 3 years as long as the past 3 Pap tests have been normal.   Some women have medical problems that increase the chance of getting cervical cancer. Talk to your caregiver about these problems. It is especially important to talk to your caregiver if a new problem develops soon after your last Pap test. In these cases, your caregiver may recommend more frequent screening and Pap tests.   The above recommendations are the same for women who have or have not gotten the vaccine for human papillomavirus (HPV).   If you had a hysterectomy for a problem that was not cancer or a condition that could lead to cancer, then you no longer need Pap tests. Even if you no longer need a Pap test, a regular exam is a good idea to make sure no other problems are  starting.   If you are between ages 65 and 70, and you have had normal Pap tests going back 10 years, you no longer need Pap tests. Even if you no longer need a Pap test, a regular exam is a good idea to make sure no other problems are starting.   If you have had past treatment for cervical cancer or a condition that could lead to cancer, you need Pap tests and screening for cancer for at least 20 years after your treatment.   If Pap tests have been discontinued, risk factors (such as a new sexual partner) need to be reassessed to determine if screening should be resumed.   The HPV test is an additional test that may be used for cervical cancer screening. The HPV test looks for the virus that can cause the cell changes on the cervix.   The cells collected during the Pap test can be tested for HPV. The HPV test could be used to screen women aged 30 years and older, and should be used in women of any age who have unclear Pap test results. After the age of 30, women should have HPV testing at the same frequency as a Pap test.   Colorectal cancer can be detected and often prevented. Most routine colorectal cancer screening begins at the age of 50 and continues through age 75. However, your caregiver may recommend screening at an earlier age if you have risk factors for colon cancer. On a yearly basis, your caregiver may provide home test kits to check for hidden blood in the stool. Use of a small camera at the end of a tube, to directly examine the colon (sigmoidoscopy or colonoscopy), can detect the earliest forms of colorectal cancer. Talk to your caregiver about this at age 50, when routine screening begins. Direct examination of the colon should be repeated every 5 to 10 years through age 75, unless early forms of pre-cancerous polyps or small growths are found.   Hepatitis C blood testing is recommended for all people born from 1945 through 1965 and any individual with known risks for hepatitis C.    Practice safe sex. Use condoms and avoid high-risk sexual practices to reduce the spread of sexually transmitted infections (STIs). STIs include gonorrhea, chlamydia, syphilis, trichomonas, herpes, HPV, and human immunodeficiency virus (HIV). Herpes, HIV, and HPV are viral illnesses that have no cure. They can result in disability, cancer, and death. Sexually active women aged 25 and younger should be checked for chlamydia. Older women with new or multiple partners should also be tested for chlamydia. Testing for other STIs is recommended if you are sexually active and at increased risk.   Osteoporosis is a disease in which the bones lose minerals and strength with aging. This can result in serious bone fractures. The risk of osteoporosis can be identified using a bone density scan. Women ages 65 and over and women at risk for fractures or osteoporosis should discuss screening with their caregivers. Ask your caregiver whether you should take a calcium supplement or vitamin D to reduce the rate of osteoporosis.   Menopause can be associated with physical symptoms and risks. Hormone replacement therapy is available to decrease symptoms and risks. You should talk to your caregiver about whether hormone replacement therapy is right for you.   Use sunscreen with sun protection factor (SPF) of 30 or more. Apply sunscreen liberally and repeatedly throughout the day. You should seek shade when your shadow is shorter than you. Protect yourself by wearing long sleeves, pants, a wide-brimmed hat, and sunglasses year round, whenever you are outdoors.   Once a month, do a whole body skin exam, using a mirror to look at the skin on your back. Notify your caregiver of new moles, moles that have irregular borders, moles that are larger than a pencil eraser, or moles that have changed in shape or color.   Stay current with required immunizations.   Influenza. You need a dose every fall (or winter). The composition of  the flu vaccine changes each year, so being vaccinated once is not enough.   Pneumococcal polysaccharide. You need 1 to 2 doses if you smoke cigarettes or if you have certain chronic medical conditions. You need 1 dose at age 65 (or older) if you have never been vaccinated.   Tetanus, diphtheria, pertussis (Tdap, Td). Get 1 dose of   Tdap vaccine if you are younger than age 65, are over 65 and have contact with an infant, are a healthcare worker, are pregnant, or simply want to be protected from whooping cough. After that, you need a Td booster dose every 10 years. Consult your caregiver if you have not had at least 3 tetanus and diphtheria-containing shots sometime in your life or have a deep or dirty wound.   HPV. You need this vaccine if you are a woman age 26 or younger. The vaccine is given in 3 doses over 6 months.   Measles, mumps, rubella (MMR). You need at least 1 dose of MMR if you were born in 1957 or later. You may also need a second dose.   Meningococcal. If you are age 19 to 21 and a first-year college student living in a residence hall, or have one of several medical conditions, you need to get vaccinated against meningococcal disease. You may also need additional booster doses.   Zoster (shingles). If you are age 60 or older, you should get this vaccine.   Varicella (chickenpox). If you have never had chickenpox or you were vaccinated but received only 1 dose, talk to your caregiver to find out if you need this vaccine.   Hepatitis A. You need this vaccine if you have a specific risk factor for hepatitis A virus infection or you simply wish to be protected from this disease. The vaccine is usually given as 2 doses, 6 to 18 months apart.   Hepatitis B. You need this vaccine if you have a specific risk factor for hepatitis B virus infection or you simply wish to be protected from this disease. The vaccine is given in 3 doses, usually over 6 months.  Preventive Services /  Frequency Ages 19 to 39  Blood pressure check.** / Every 1 to 2 years.   Lipid and cholesterol check.** / Every 5 years beginning at age 20.   Clinical breast exam.** / Every 3 years for women in their 20s and 30s.   Pap test.** / Every 2 years from ages 21 through 29. Every 3 years starting at age 30 through age 65 or 70 with a history of 3 consecutive normal Pap tests.   HPV screening.** / Every 3 years from ages 30 through ages 65 to 70 with a history of 3 consecutive normal Pap tests.   Hepatitis C blood test.** / For any individual with known risks for hepatitis C.   Skin self-exam. / Monthly.   Influenza immunization.** / Every year.   Pneumococcal polysaccharide immunization.** / 1 to 2 doses if you smoke cigarettes or if you have certain chronic medical conditions.   Tetanus, diphtheria, pertussis (Tdap, Td) immunization. / A one-time dose of Tdap vaccine. After that, you need a Td booster dose every 10 years.   HPV immunization. / 3 doses over 6 months, if you are 26 and younger.   Measles, mumps, rubella (MMR) immunization. / You need at least 1 dose of MMR if you were born in 1957 or later. You may also need a second dose.   Meningococcal immunization. / 1 dose if you are age 19 to 21 and a first-year college student living in a residence hall, or have one of several medical conditions, you need to get vaccinated against meningococcal disease. You may also need additional booster doses.   Varicella immunization.** / Consult your caregiver.   Hepatitis A immunization.** / Consult your caregiver. 2 doses, 6 to 18 months   apart.   Hepatitis B immunization.** / Consult your caregiver. 3 doses usually over 6 months.  Ages 40 to 64  Blood pressure check.** / Every 1 to 2 years.   Lipid and cholesterol check.** / Every 5 years beginning at age 20.   Clinical breast exam.** / Every year after age 40.   Mammogram.** / Every year beginning at age 40 and continuing for as  long as you are in good health. Consult with your caregiver.   Pap test.** / Every 3 years starting at age 30 through age 65 or 70 with a history of 3 consecutive normal Pap tests.   HPV screening.** / Every 3 years from ages 30 through ages 65 to 70 with a history of 3 consecutive normal Pap tests.   Fecal occult blood test (FOBT) of stool. / Every year beginning at age 50 and continuing until age 75. You may not need to do this test if you get a colonoscopy every 10 years.   Flexible sigmoidoscopy or colonoscopy.** / Every 5 years for a flexible sigmoidoscopy or every 10 years for a colonoscopy beginning at age 50 and continuing until age 75.   Hepatitis C blood test.** / For all people born from 1945 through 1965 and any individual with known risks for hepatitis C.   Skin self-exam. / Monthly.   Influenza immunization.** / Every year.   Pneumococcal polysaccharide immunization.** / 1 to 2 doses if you smoke cigarettes or if you have certain chronic medical conditions.   Tetanus, diphtheria, pertussis (Tdap, Td) immunization.** / A one-time dose of Tdap vaccine. After that, you need a Td booster dose every 10 years.   Measles, mumps, rubella (MMR) immunization. / You need at least 1 dose of MMR if you were born in 1957 or later. You may also need a second dose.   Varicella immunization.** / Consult your caregiver.   Meningococcal immunization.** / Consult your caregiver.   Hepatitis A immunization.** / Consult your caregiver. 2 doses, 6 to 18 months apart.   Hepatitis B immunization.** / Consult your caregiver. 3 doses, usually over 6 months.  Ages 65 and over  Blood pressure check.** / Every 1 to 2 years.   Lipid and cholesterol check.** / Every 5 years beginning at age 20.   Clinical breast exam.** / Every year after age 40.   Mammogram.** / Every year beginning at age 40 and continuing for as long as you are in good health. Consult with your caregiver.   Pap test.** /  Every 3 years starting at age 30 through age 65 or 70 with a 3 consecutive normal Pap tests. Testing can be stopped between 65 and 70 with 3 consecutive normal Pap tests and no abnormal Pap or HPV tests in the past 10 years.   HPV screening.** / Every 3 years from ages 30 through ages 65 or 70 with a history of 3 consecutive normal Pap tests. Testing can be stopped between 65 and 70 with 3 consecutive normal Pap tests and no abnormal Pap or HPV tests in the past 10 years.   Fecal occult blood test (FOBT) of stool. / Every year beginning at age 50 and continuing until age 75. You may not need to do this test if you get a colonoscopy every 10 years.   Flexible sigmoidoscopy or colonoscopy.** / Every 5 years for a flexible sigmoidoscopy or every 10 years for a colonoscopy beginning at age 50 and continuing until age 75.   Hepatitis   C blood test.** / For all people born from 30 through 1965 and any individual with known risks for hepatitis C.   Osteoporosis screening.** / A one-time screening for women ages 58 and over and women at risk for fractures or osteoporosis.   Skin self-exam. / Monthly.   Influenza immunization.** / Every year.   Pneumococcal polysaccharide immunization.** / 1 dose at age 16 (or older) if you have never been vaccinated.   Tetanus, diphtheria, pertussis (Tdap, Td) immunization. / A one-time dose of Tdap vaccine if you are over 65 and have contact with an infant, are a Research scientist (physical sciences), or simply want to be protected from whooping cough. After that, you need a Td booster dose every 10 years.   Varicella immunization.** / Consult your caregiver.   Meningococcal immunization.** / Consult your caregiver.   Hepatitis A immunization.** / Consult your caregiver. 2 doses, 6 to 18 months apart.   Hepatitis B immunization.** / Check with your caregiver. 3 doses, usually over 6 months.  ** Family history and personal history of risk and conditions may change your caregiver's  recommendations. Document Released: 07/10/2001 Document Revised: 05/03/2011 Document Reviewed: 10/09/2010 [facility] Patient Information 2012 [place], [place].BRCA-1 and BRCA-2 BRCA-1 and BRCA-2 are 2 genes that are linked with hereditary breast and ovarian cancers. About 200,000 women are diagnosed with invasive breast cancer each year and about 23,000 with ovarian cancer (according to the American Cancer Society). Of these cancers, about 5% to 10% will be due to a mutation in one of the BRCA genes. Men can also inherit an increased risk of developing breast cancer, primarily from an alteration in the BRCA-2 gene.  Individuals with mutations in BRCA1 or BRCA2 have significantly elevated risks for breast cancer (up to 80% lifetime risk), ovarian cancer (up to 40% lifetime risk), bilateral breast cancer and other types of cancers. BRCA mutations are inherited and passed from generation to generation. One half of the time, they are passed from the father's side of the family.  The DNA in white blood cells is used to detect mutations in the BRCA genes. While the gene products (proteins) of the BRCA genes act only in breast and ovarian tissue, the genes are present in every cell of the body and blood is the most easily accessible source of that DNA. PREPARATION FOR TEST The test for BRCA mutations is done on a blood sample collected by needle from a vein in the arm. The test does not require surgical biopsy of breast or ovarian tissue.  NORMAL FINDINGS No genetic mutations. Ranges for normal findings may vary among different laboratories and hospitals. You should always check with your doctor after having lab work or other tests done to discuss the meaning of your test results and whether your values are considered within normal limits. MEANING OF TEST  Your caregiver will go over the test results with you and discuss the importance and meaning of your results, as well as treatment options and the need for  additional tests if necessary. OBTAINING THE TEST RESULTS It is your responsibility to obtain your test results. Ask the lab or department performing the test when and how you will get your results. OTHER THINGS TO KNOW Your test results may have implications for other family members. When one member of a family is tested for BRCA mutations, issues often arise about how or whether to share this information with other family members. Seek advice from a genetic counselor about communication of result with your family members.  Pre and post test consultation with a health care provider knowledgeable about genetic testing cannot be overemphasized.  There are many issues to be considered when preparing for a genetic test and upon learning the results, and a genetic counselor has the knowledge and experience to help you sort through them.  If the BRCA test is positive, the options include increased frequency of check-ups (e.g., mammography, blood tests for CA-125, or transvaginal ultrasonography); medications that could reduce risk (e.g., oral contraceptives or tamoxifen); or surgical removal of the ovaries or breasts. There are a number of variables involved and it is important to discuss your options with your doctor and genetic counselor. Research studies have reported that for every 1000 women negative for BRCA mutations, between 12 and 45 of them will develop breast cancer by age 51 and between 3 and 4 will develop ovarian cancer by age 71. The risk increases with age. The test can be ordered by a doctor, preferably by one who can also offer genetic counseling. The blood sample will be sent to a laboratory that specializes in BRCA testing. The American Society of Clinical Oncology and the National Breast Cancer Coalition encourage women seeking the test to participate in long-term outcome studies to help gather information on the effectiveness of different check-up and treatment options. Document Released:  06/07/2004 Document Revised: 05/03/2011 Document Reviewed: 04/19/2008 [facility] Patient Information 2012 [place], [place].

## 2011-09-21 ENCOUNTER — Encounter (HOSPITAL_COMMUNITY)
Admission: RE | Admit: 2011-09-21 | Discharge: 2011-09-21 | Disposition: A | Discharge status: Home / Self Care | Payer: Medicare Other | Source: Ambulatory Visit | Attending: Cardiovascular Disease | Admitting: Cardiovascular Disease

## 2011-09-24 ENCOUNTER — Encounter (HOSPITAL_COMMUNITY)
Admission: RE | Admit: 2011-09-24 | Discharge: 2011-09-24 | Disposition: A | Discharge status: Home / Self Care | Payer: Medicare Other | Source: Ambulatory Visit | Attending: Cardiovascular Disease | Admitting: Cardiovascular Disease

## 2011-09-24 ENCOUNTER — Encounter: Payer: Self-pay | Admitting: Family Medicine

## 2011-09-24 ENCOUNTER — Ambulatory Visit (INDEPENDENT_AMBULATORY_CARE_PROVIDER_SITE_OTHER): Payer: Medicare Other | Admitting: Family Medicine

## 2011-09-24 VITALS — BP 130/80 | HR 68 | Temp 98.2°F | Wt 186.8 lb

## 2011-09-24 DIAGNOSIS — I10 Essential (primary) hypertension: Secondary | ICD-10-CM | POA: Diagnosis not present

## 2011-09-24 DIAGNOSIS — E785 Hyperlipidemia, unspecified: Secondary | ICD-10-CM

## 2011-09-24 DIAGNOSIS — D696 Thrombocytopenia, unspecified: Secondary | ICD-10-CM

## 2011-09-24 DIAGNOSIS — R7309 Other abnormal glucose: Secondary | ICD-10-CM | POA: Diagnosis not present

## 2011-09-24 NOTE — Assessment & Plan Note (Signed)
bp in fair control at this time  No changes needed  Disc lifstyle change with low sodium diet and exercise   

## 2011-09-24 NOTE — Assessment & Plan Note (Signed)
New and very mild - ? If rel to recent surg No symptoms Re check cbc 3 mo  Will be f/u annual exam in 6 mo

## 2011-09-24 NOTE — Assessment & Plan Note (Signed)
This is well controlled with diet and exercise with a1c 5.7 Disc need for wt loss Doing well s/p aortic valve repair

## 2011-09-24 NOTE — Assessment & Plan Note (Signed)
Improved with red yeast rice/ and no LFT elevations  Feeling fine on that Disc goals for lipids and reasons to control them Rev labs with pt Rev low sat fat diet in detail

## 2011-09-24 NOTE — Progress Notes (Signed)
Subjective:    Patient ID: [name], female    DOB: May 11, 1934, 78 y.o.   MRN: [id number]  HPI Here for f/u of chronic conditions  Had surgery for AS and doing very well  Is feeling quite a bit better - now can do so much more - more stamina  Is trying to keep good care of herself   Is still in cardio/pulm rehab -- using a stepper and walking  She did have one fall at home - was a bit light headed   Wt is up 2 lb and bmi 31 No swelling   bp is 130/80    Today No cp or palpitations or headaches or edema  No side effects to medicines     Chemistry      Component Value Date/Time   NA 141 09/18/2011 0835   K 4.1 09/18/2011 0835   CL 104 09/18/2011 0835   CO2 30 09/18/2011 0835   BUN 15 09/18/2011 0835   CREATININE 0.9 09/18/2011 0835      Component Value Date/Time   CALCIUM 9.6 09/18/2011 0835   ALKPHOS 54 09/18/2011 0835   AST 25 09/18/2011 0835   ALT 25 09/18/2011 0835   BILITOT 0.2* 09/18/2011 0835      Lab Results  Component Value Date   ALT 25 09/18/2011   AST 25 09/18/2011   ALKPHOS 54 09/18/2011   BILITOT 0.2* 09/18/2011      Hyperglycemia - a1c is 5.7 Good control overall  Diet - she is working on that very hard - sugar and fat  Is getting much more exercise  Lab Results  Component Value Date   CHOL 171 09/18/2011   HDL 49.40 09/18/2011   LDLCALC 104* 09/18/2011   LDLDIRECT 147.6 03/16/2010   TRIG 89.0 09/18/2011   CHOLHDL 3 09/18/2011   is on red yeast rice/ cannot take statins   Patient Active Problem List  Diagnoses  . Malignant neoplasm of corpus uteri, except isthmus  . VITAMIN D DEFICIENCY  . HYPERLIPIDEMIA  . HEARING LOSS, LEFT EAR  . HYPERTENSION  . AORTIC STENOSIS  . HEMORRHOIDS, INTERNAL  . EXTERNAL HEMORRHOIDS  . ESOPHAGEAL SPASM  . HIATAL HERNIA  . DIVERTICULOSIS, COLON  . CONSTIPATION  . OSTEOPENIA  . DEPENDENT EDEMA, LEGS  . CAROTID BRUITS, BILATERAL  . HYPERGLYCEMIA  . Dyspepsia  . Near syncope  . Breast cancer, stage 2  .  Thrombocytopenia   Past Medical History  Diagnosis Date  . Family history of colonic polyps   . Other abnormal glucose   . Pain in joint, shoulder region   . Unspecified hearing loss   . Aortic stenosis     s/p tissue AVR 05/2011  . Alcohol abuse, unspecified   . Dyskinesia of esophagus   . Diaphragmatic hernia without mention of obstruction or gangrene   . Tobacco use disorder   . Internal hemorrhoids without mention of complication   . Diverticulosis of colon (without mention of hemorrhage)   . Postmenopausal bleeding   . HTN (hypertension)   . HLD (hyperlipidemia)   . Malignant neoplasm of corpus uteri, except isthmus   . Breast CA     rt  . Arthritis    Past Surgical History  Procedure Date  . Rotator cuff repair 05/2008    right  . Laparoscopy w/ total bilateral pelvic and peri-aortic lymphadenectomy 11/04/2007  . Total abdominal hysterectomy w/ bilateral salpingoophorectomy 11/04/07    salpingo-oophrectomy  . Cataract extraction  bil  . 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 Breast lumpectomy 11/2009    DCIS, neg sentinel lymph node biopsy  . Abdominal hysterectomy   . Aortic valve replacement 06/13/2011    Procedure: AORTIC VALVE REPLACEMENT (AVR);  Surgeon: [name], MD;  Location: [facility] OR;  Service: Open Heart Surgery;  Laterality: N/A;  . Breast lumpectomy   . Shoulder surgery   . Abdominal hysterectomy   . Paraaortic lymphadenectomy    History  Substance Use Topics  . Smoking status: Former Smoker -- 1.0 packs/day    Quit date: 03/28/2000  . Smokeless tobacco: Not on file  . Alcohol Use: No   Family History  Problem Relation Age of Onset  . Colon polyps Sister     2010  . Cancer Maternal Aunt     possible post menopausal breast cancer  . Heart disease Mother   . Stroke Father    Allergies  Allergen Reactions  . Calcium     REACTION: constipation  . Ezetimibe     REACTION: fatigue  . Ramipril     REACTION: abdominal pain  . Simvastatin      REACTION: leg pain and body aches  . Valsartan     REACTION: abdominal pain   Current Outpatient Prescriptions on File Prior to Visit  Medication Sig Dispense Refill  . aspirin 325 MG tablet Take 325 mg by mouth. Take 1/2 tablet daily      . carvedilol (COREG) 25 MG tablet Take 1 tablet (25 mg total) by mouth 2 (two) times daily with a meal.  180 tablet  3  . Cholecalciferol (VITAMIN D) 1000 UNITS capsule Take 1,000 Units by mouth 2 (two) times daily.       . diphenhydrAMINE (BENADRYL) 50 MG tablet Take 50 mg by mouth at bedtime as needed.      . hydrochlorothiazide (HYDRODIURIL) 25 MG tablet Take 25 mg by mouth once.       . Omega-3 Fatty Acids (FISH OIL) 1000 MG CAPS Take 2,000 mg by mouth daily.       . potassium chloride SA (K-DUR,KLOR-CON) 20 MEQ tablet TAKE 1 TABLET TWICE A DAY  180 tablet  2  . Psyllium (METAMUCIL PO) Take by mouth.        Review of Systems Review of Systems  Constitutional: Negative for fever, appetite change, fatigue and unexpected weight change.  Eyes: Negative for pain and visual disturbance.  Respiratory: Negative for cough and shortness of breath.   Cardiovascular: Negative for cp or palpitations    Gastrointestinal: Negative for nausea, diarrhea and constipation.  Genitourinary: Negative for urgency and frequency.  Skin: Negative for pallor or rash   Neurological: Negative for weakness, light-headedness, numbness and headaches.  Hematological: Negative for adenopathy. Does not bruise/bleed easily.  Psychiatric/Behavioral: Negative for dysphoric mood. The patient is not nervous/anxious.         Objective:   Physical Exam  Constitutional: She appears well-developed and well-nourished. No distress.  HENT:  Head: Normocephalic and atraumatic.  Mouth/Throat: Oropharynx is clear and moist.  Eyes: Conjunctivae and EOM are normal. Pupils are equal, round, and reactive to light. No scleral icterus.  Neck: Normal range of motion. Neck supple. No JVD present.  Carotid bruit is not present. No thyromegaly present.  Cardiovascular: Normal rate, regular rhythm and intact distal pulses.  Exam reveals no gallop.   Murmur heard. Pulmonary/Chest: Effort normal and breath sounds normal. No respiratory distress. She has no wheezes.  No crackles   Abdominal: Soft. Bowel sounds are normal. She exhibits no distension and no abdominal bruit. There is no tenderness.  Musculoskeletal: She exhibits no edema and no tenderness.  Lymphadenopathy:    She has no cervical adenopathy.  Neurological: She is alert. She has normal reflexes. No cranial nerve deficit. She exhibits normal muscle tone. Coordination normal.  Skin: Skin is warm and dry. No rash noted. No erythema. No pallor.       Healing mid sternal scar  Psychiatric: She has a normal mood and affect.          Assessment & Plan:

## 2011-09-24 NOTE — Patient Instructions (Signed)
Keep up the good work with healthy diet and exercise  We will watch your platelet count - schedule non fasting labs 3 months Follow up with me in 6 months for annual exam with labs prior

## 2011-09-26 ENCOUNTER — Encounter (HOSPITAL_COMMUNITY)
Admission: RE | Admit: 2011-09-26 | Discharge: 2011-09-26 | Disposition: A | Discharge status: Home / Self Care | Payer: Medicare Other | Source: Ambulatory Visit | Attending: Cardiovascular Disease | Admitting: Cardiovascular Disease

## 2011-09-26 DIAGNOSIS — Z87891 Personal history of nicotine dependence: Secondary | ICD-10-CM | POA: Diagnosis not present

## 2011-09-26 DIAGNOSIS — Z9079 Acquired absence of other genital organ(s): Secondary | ICD-10-CM | POA: Diagnosis not present

## 2011-09-26 DIAGNOSIS — Z7982 Long term (current) use of aspirin: Secondary | ICD-10-CM | POA: Insufficient documentation

## 2011-09-26 DIAGNOSIS — I359 Nonrheumatic aortic valve disorder, unspecified: Secondary | ICD-10-CM | POA: Insufficient documentation

## 2011-09-26 DIAGNOSIS — I10 Essential (primary) hypertension: Secondary | ICD-10-CM | POA: Insufficient documentation

## 2011-09-26 DIAGNOSIS — Z9889 Other specified postprocedural states: Secondary | ICD-10-CM | POA: Insufficient documentation

## 2011-09-26 DIAGNOSIS — E785 Hyperlipidemia, unspecified: Secondary | ICD-10-CM | POA: Insufficient documentation

## 2011-09-26 DIAGNOSIS — Z5189 Encounter for other specified aftercare: Secondary | ICD-10-CM | POA: Diagnosis not present

## 2011-09-26 DIAGNOSIS — Z90710 Acquired absence of both cervix and uterus: Secondary | ICD-10-CM | POA: Insufficient documentation

## 2011-09-28 ENCOUNTER — Encounter (HOSPITAL_COMMUNITY)
Admission: RE | Admit: 2011-09-28 | Discharge: 2011-09-28 | Disposition: A | Discharge status: Home / Self Care | Payer: Medicare Other | Source: Ambulatory Visit | Attending: Cardiovascular Disease | Admitting: Cardiovascular Disease

## 2011-10-01 ENCOUNTER — Encounter (HOSPITAL_COMMUNITY)
Admission: RE | Admit: 2011-10-01 | Discharge: 2011-10-01 | Disposition: A | Discharge status: Home / Self Care | Payer: Medicare Other | Source: Ambulatory Visit | Attending: Cardiovascular Disease | Admitting: Cardiovascular Disease

## 2011-10-03 ENCOUNTER — Encounter (HOSPITAL_COMMUNITY)
Admission: RE | Admit: 2011-10-03 | Discharge: 2011-10-03 | Disposition: A | Discharge status: Home / Self Care | Payer: Medicare Other | Source: Ambulatory Visit | Attending: Cardiovascular Disease | Admitting: Cardiovascular Disease

## 2011-10-05 ENCOUNTER — Encounter (HOSPITAL_COMMUNITY)
Admission: RE | Admit: 2011-10-05 | Discharge: 2011-10-05 | Disposition: A | Discharge status: Home / Self Care | Payer: Medicare Other | Source: Ambulatory Visit | Attending: Cardiovascular Disease | Admitting: Cardiovascular Disease

## 2011-10-08 ENCOUNTER — Encounter (HOSPITAL_COMMUNITY)
Admission: RE | Admit: 2011-10-08 | Discharge: 2011-10-08 | Disposition: A | Discharge status: Home / Self Care | Payer: Medicare Other | Source: Ambulatory Visit | Attending: Cardiovascular Disease | Admitting: Cardiovascular Disease

## 2011-10-10 ENCOUNTER — Encounter (HOSPITAL_COMMUNITY)
Admission: RE | Admit: 2011-10-10 | Discharge: 2011-10-10 | Disposition: A | Discharge status: Home / Self Care | Payer: Medicare Other | Source: Ambulatory Visit | Attending: Cardiovascular Disease | Admitting: Cardiovascular Disease

## 2011-10-12 ENCOUNTER — Encounter (HOSPITAL_COMMUNITY)
Admission: RE | Admit: 2011-10-12 | Discharge: 2011-10-12 | Disposition: A | Discharge status: Home / Self Care | Payer: Medicare Other | Source: Ambulatory Visit | Attending: Cardiovascular Disease | Admitting: Cardiovascular Disease

## 2011-10-15 ENCOUNTER — Encounter (HOSPITAL_COMMUNITY)
Admission: RE | Admit: 2011-10-15 | Discharge: 2011-10-15 | Disposition: A | Discharge status: Home / Self Care | Payer: Medicare Other | Source: Ambulatory Visit | Attending: Cardiovascular Disease | Admitting: Cardiovascular Disease

## 2011-10-15 ENCOUNTER — Encounter (HOSPITAL_COMMUNITY): Payer: Self-pay

## 2011-10-17 ENCOUNTER — Encounter (HOSPITAL_COMMUNITY): Payer: Medicare Other

## 2011-10-19 ENCOUNTER — Encounter (HOSPITAL_COMMUNITY): Payer: Medicare Other

## 2011-10-19 ENCOUNTER — Encounter (HOSPITAL_COMMUNITY)
Admission: RE | Admit: 2011-10-19 | Discharge: 2011-10-19 | Disposition: A | Discharge status: Home / Self Care | Payer: Medicare Other | Source: Ambulatory Visit | Attending: Cardiovascular Disease | Admitting: Cardiovascular Disease

## 2011-11-07 ENCOUNTER — Ambulatory Visit (INDEPENDENT_AMBULATORY_CARE_PROVIDER_SITE_OTHER): Payer: Medicare Other | Admitting: Cardiovascular Disease

## 2011-11-07 ENCOUNTER — Encounter: Payer: Self-pay | Admitting: Cardiovascular Disease

## 2011-11-07 VITALS — BP 120/68 | HR 64 | Ht 65.5 in | Wt 189.4 lb

## 2011-11-07 DIAGNOSIS — I359 Nonrheumatic aortic valve disorder, unspecified: Secondary | ICD-10-CM

## 2011-11-07 MED ORDER — POTASSIUM CHLORIDE CRYS ER 20 MEQ PO TBCR: 20.0000 meq | EXTENDED_RELEASE_TABLET | Freq: Two times a day (BID) | ORAL | Status: DC

## 2011-11-07 NOTE — Patient Instructions (Addendum)
Your physician wants you to follow-up in: January 2014 You will receive a reminder letter in the mail two months in advance. If you don't receive a letter, please call our office to schedule the follow-up appointment.  

## 2011-11-07 NOTE — Progress Notes (Signed)
HPI:  12 her old woman presenting for followup evaluation. The patient is followed for a history of severe aortic stenosis and bioprosthetic AVR performed in January 2013. Postoperative echocardiogram gram performed in February demonstrated normal left ventricular size and systolic function, moderate LVH, and normal gradients following bioprosthetic AVR.  From a symptomatic perspective, the patient is doing very well. When I saw her several months ago she had a near syncopal episode. She's had no recurrence of this. She never had frank syncope. She denies chest pain or pressure, dyspnea, orthopnea, or PND. She does admit to chronic leg swelling, worse as the day progresses. She watches her diet closely and followed to salt restricted diet.  Outpatient Encounter Prescriptions as of 11/07/2011  Medication Sig Dispense Refill  . aspirin 325 MG tablet Take 325 mg by mouth. Take 1/2 tablet daily      . carvedilol (COREG) 25 MG tablet Take 1 tablet (25 mg total) by mouth 2 (two) times daily with a meal.  180 tablet  3  . Cholecalciferol (VITAMIN D) 1000 UNITS capsule Take 1,000 Units by mouth 2 (two) times daily.       . diphenhydrAMINE (BENADRYL) 50 MG tablet Take 50 mg by mouth at bedtime as needed.      . hydrochlorothiazide (HYDRODIURIL) 25 MG tablet Take 25 mg by mouth once.       . Omega-3 Fatty Acids (FISH OIL) 1000 MG CAPS Take 2,000 mg by mouth daily.       . potassium chloride SA (K-DUR,KLOR-CON) 20 MEQ tablet TAKE 1 TABLET TWICE A DAY  180 tablet  2  . Psyllium (METAMUCIL PO) Take by mouth.      . Red Yeast Rice 600 MG CAPS Take 1,200 mg by mouth daily.        Allergies  Allergen Reactions  . Calcium     REACTION: constipation  . Ezetimibe     REACTION: fatigue  . Ramipril     REACTION: abdominal pain  . Simvastatin     REACTION: leg pain and body aches  . Valsartan     REACTION: abdominal pain    Past Medical History  Diagnosis Date  . Family history of colonic polyps   .  Other abnormal glucose   . Pain in joint, shoulder region   . Unspecified hearing loss   . Aortic stenosis     s/p tissue AVR 05/2011  . Alcohol abuse, unspecified   . Dyskinesia of esophagus   . Diaphragmatic hernia without mention of obstruction or gangrene   . Tobacco use disorder   . Internal hemorrhoids without mention of complication   . Diverticulosis of colon (without mention of hemorrhage)   . Postmenopausal bleeding   . HTN (hypertension)   . HLD (hyperlipidemia)   . Malignant neoplasm of corpus uteri, except isthmus   . Breast CA     rt  . Arthritis     ROS: Negative except as per HPI  BP 120/68  Pulse 64  Ht 5' 5.5" (1.664 m)  Wt 85.911 kg (189 lb 6.4 oz)  BMI 31.04 kg/m2  LMP 05/29/2007  PHYSICAL EXAM: Pt is alert and oriented, NAD HEENT: normal Neck: JVP - normal, carotids 2+= without bruits Lungs: CTA bilaterally CV: RRR with grade 2/6 systolic ejection murmur at the left sternal border Abd: soft, NT, Positive BS, no hepatomegaly Ext: Trace pretibial edema bilaterally, distal pulses intact and equal Skin: warm/dry no rash  ASSESSMENT AND PLAN: 1. Severe  aortic stenosis status post bioprosthetic aVR. The patient continues to do well. Her echo was reviewed. I will see her back in January when she is out to one year from her surgery.  2. Hypertension. Controlled on current therapy with carvedilol and hydrochlorothiazide.  3. Hyperlipidemia. The patient is tolerating red yeast rice. She is intolerant to other lipid-lowering therapy. Lipids have been followed by Dr. Milinda Antis.  [name] 11/07/2011 9:29 AM

## 2011-11-22 ENCOUNTER — Telehealth (INDEPENDENT_AMBULATORY_CARE_PROVIDER_SITE_OTHER): Payer: Self-pay | Admitting: General Surgery

## 2011-11-22 ENCOUNTER — Ambulatory Visit (INDEPENDENT_AMBULATORY_CARE_PROVIDER_SITE_OTHER): Payer: Medicare Other | Admitting: General Surgery

## 2011-11-22 ENCOUNTER — Encounter (INDEPENDENT_AMBULATORY_CARE_PROVIDER_SITE_OTHER): Payer: Self-pay | Admitting: General Surgery

## 2011-11-22 VITALS — BP 138/79 | HR 68 | Temp 98.6°F | Ht 65.5 in | Wt 190.2 lb

## 2011-11-22 DIAGNOSIS — C50919 Malignant neoplasm of unspecified site of unspecified female breast: Secondary | ICD-10-CM

## 2011-11-22 NOTE — Progress Notes (Signed)
Patient ID: [name], female   DOB: May 22, 1934, 78 y.o.   MRN: [id number]  Chief Complaint  Patient presents with  . Follow-up    yrly br ck    HPI [name] is a 78 y.o. female.  She returns for long-term followup regarding surveillance of her right breast cancer.  On December 02, 2009 she underwent right partial mastectomy, sentinel node biopsy, insertion of MammoSite. Final pathology report showed carcinoma in situ, Tis, N0. Radiation therapy was supervised by Dr. Roselind Messier.    She is followed by Dr. Welton Flakes but is not on any antiestrogen therapy.  She notices that the thickening in her operative site is unchanged. Bilateral mammograms were performed on January 17, 2011 and were unremarkable. She is scheduled for bilateral breast imaging in August of 2013 at [facility]  Significant history is that she underwent aortic valve replacement by Dr. Laneta Simmers  On Jan.  16 2013 and that went well and she is feeling better. She is followed by Dr. Vinnie Langton for primary care and Dr. [name] for cardiology. HPI  Past Medical History  Diagnosis Date  . Family history of colonic polyps   . Other abnormal glucose   . Pain in joint, shoulder region   . Unspecified hearing loss   . Aortic stenosis     s/p tissue AVR 05/2011  . Alcohol abuse, unspecified   . Dyskinesia of esophagus   . Diaphragmatic hernia without mention of obstruction or gangrene   . Tobacco use disorder   . Internal hemorrhoids without mention of complication   . Diverticulosis of colon (without mention of hemorrhage)   . Postmenopausal bleeding   . HTN (hypertension)   . HLD (hyperlipidemia)   . Malignant neoplasm of corpus uteri, except isthmus   . Breast CA     rt  . Arthritis     Past Surgical History  Procedure Date  . Rotator cuff repair 05/2008    right  . Laparoscopy w/ total bilateral pelvic and peri-aortic lymphadenectomy 11/04/2007  . Total abdominal hysterectomy w/ bilateral  salpingoophorectomy 11/04/07    salpingo-oophrectomy  . Cataract extraction     bil  . 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 Breast lumpectomy 11/2009    DCIS, neg sentinel lymph node biopsy  . Abdominal hysterectomy   . Aortic valve replacement 06/13/2011    Procedure: AORTIC VALVE REPLACEMENT (AVR);  Surgeon: [name], MD;  Location: [facility] OR;  Service: Open Heart Surgery;  Laterality: N/A;  . Breast lumpectomy   . Shoulder surgery   . Abdominal hysterectomy   . Paraaortic lymphadenectomy     Family History  Problem Relation Age of Onset  . Colon polyps Sister     2010  . Cancer Maternal Aunt     possible post menopausal breast cancer  . Heart disease Mother   . Stroke Father     Social History History  Substance Use Topics  . Smoking status: Former Smoker -- 1.0 packs/day    Quit date: 03/28/2000  . Smokeless tobacco: Not on file  . Alcohol Use: No    Allergies  Allergen Reactions  . Calcium     REACTION: constipation  . Ezetimibe     REACTION: fatigue  . Ramipril     REACTION: abdominal pain  . Simvastatin     REACTION: leg pain and body aches  . Valsartan     REACTION: abdominal pain    Current  Outpatient Prescriptions  Medication Sig Dispense Refill  . aspirin 325 MG tablet Take 325 mg by mouth. Take 1/2 tablet daily      . carvedilol (COREG) 25 MG tablet Take 1 tablet (25 mg total) by mouth 2 (two) times daily with a meal.  180 tablet  3  . Cholecalciferol (VITAMIN D) 1000 UNITS capsule Take 1,000 Units by mouth 2 (two) times daily.       . diphenhydrAMINE (BENADRYL) 50 MG tablet Take 50 mg by mouth at bedtime as needed.      . hydrochlorothiazide (HYDRODIURIL) 25 MG tablet Take 25 mg by mouth once.       . Omega-3 Fatty Acids (FISH OIL) 1000 MG CAPS Take 2,000 mg by mouth daily.       . potassium chloride SA (K-DUR,KLOR-CON) 20 MEQ tablet Take 1 tablet (20 mEq total) by mouth 2 (two) times daily.  180 tablet  3  . Psyllium (METAMUCIL PO) Take by mouth.        . Red Yeast Rice 600 MG CAPS Take 1,200 mg by mouth daily.        Review of Systems Review of Systems  Constitutional: Negative for fever, chills and unexpected weight change.  HENT: Negative for hearing loss, congestion, sore throat, trouble swallowing and voice change.   Eyes: Negative for visual disturbance.  Respiratory: Negative for cough and wheezing.   Cardiovascular: Negative for chest pain, palpitations and leg swelling.  Gastrointestinal: Negative for nausea, vomiting, abdominal pain, diarrhea, constipation, blood in stool, abdominal distention and anal bleeding.  Genitourinary: Negative for hematuria, vaginal bleeding and difficulty urinating.  Musculoskeletal: Positive for myalgias and arthralgias.  Skin: Negative for rash and wound.  Neurological: Negative for seizures, syncope and headaches.  Hematological: Negative for adenopathy. Does not bruise/bleed easily.  Psychiatric/Behavioral: Negative for confusion.    Blood pressure 138/79, pulse 68, temperature 98.6 F (37 C), temperature source Temporal, height 5' 5.5" (1.664 m), weight 190 lb 3.2 oz (86.274 kg), last menstrual period 05/29/2007, SpO2 96.00%.  Physical Exam Physical Exam  Constitutional: She is oriented to person, place, and time. She appears well-developed and well-nourished. No distress.  HENT:  Head: Normocephalic and atraumatic.  Nose: Nose normal.  Mouth/Throat: No oropharyngeal exudate.  Neck: Neck supple. No JVD present. No tracheal deviation present. No thyromegaly present.  Cardiovascular: Normal rate, regular rhythm and intact distal pulses.   Murmur heard.      Aortic systolic murmur noted.  Pulmonary/Chest: Effort normal and breath sounds normal. No respiratory distress. She has no wheezes. She has no rales. She exhibits no tenderness.       Well-healed sternotomy scar. Right breast lumpectomy scar upper outer quadrant with chronic fibrotic thickening under the scar. Right axillary incision  well-healed. No adenopathy. No other breast mass bilaterally. No adenopathy on the left. Nipple and areolar complexes looked normal.  Musculoskeletal: She exhibits no edema and no tenderness.  Lymphadenopathy:    She has no cervical adenopathy.  Neurological: She is alert and oriented to person, place, and time. She exhibits normal muscle tone. Coordination normal.  Skin: Skin is warm. No rash noted. She is not diaphoretic. No erythema. No pallor.  Psychiatric: She has a normal mood and affect. Her behavior is normal. Judgment and thought content normal.    Data Reviewed All old records and last mammograms.  Assessment    Ductal carcinoma in situ right breast, upper outer quadrants. No evidence of recurrence 2 years following right partial mastectomy sentinel  lymph node biopsy and accelerated partial breast radiation.   Recent AVR, doing well.  Hypertension    Plan    Keep appointment with Dr. Drue Second next month.  Repeat bilateral mammograms and right breast ultrasound August 2013.  Repeat bowel mammogram August 2014  Return to see me in September 2014.       Angelia Mould. Derrell Lolling, M.D., [facility] General and Minimally invasive Surgery Breast and Colorectal Surgery Office:   [phone] Pager:   [phone]  11/22/2011, 9:42 AM

## 2011-11-22 NOTE — Telephone Encounter (Signed)
Spoke with Victorino Dike at the [facility] and advised of test requested by Dr. Derrell Lolling. Advised that the patient stated she had pain at lumpectomy site during the mgm performed. Requested the results need to be sent to Dr. Derrell Lolling for review. Also advised Victorino Dike that this is a patient of Dr. Welton Flakes and to please make her aware of the request and that she will see Dr. Derrell Lolling again September next year. He would like for the patient to have the testing and receive the results before she sees him again  (next appointment to be September 2014).

## 2011-11-22 NOTE — Patient Instructions (Signed)
Your breast exam is basically unremarkable. There is no evidence of cancer. The thickening in your lumpectomy site is stable and probably due to scar tissue.  Be sure to get bilateral mammograms and a right breast ultrasound in August of this year as scheduled.  Followup bilateral mammogram should be done in August of 2014.  Keep your appt. To  her to see Dr. Welton Flakes next month.  Return to see Dr. Derrell Lolling in September of 2014.

## 2011-11-23 NOTE — Progress Notes (Signed)
Pt graduated from cardiac rehab completing 36/36 exercise sessions and 11 education sessions.  VSS, telemetry-NSR. Pt had active participation and should be commended for her efforts.  Pt did not have hospital admission during cardiac rehab period.  Pt plans to exercise on her own at home and will need MD encouragement to continue.  Pt was a pleasure to have in the program.  Thank you for the referral.

## 2011-12-07 ENCOUNTER — Telehealth: Payer: Self-pay | Admitting: *Deleted

## 2011-12-07 NOTE — Telephone Encounter (Signed)
Pt called states " i have 2 appts and don't know which one I'm suppose to come to?" Confirmed with pt system showing appt on Wednesday 7/17 830lab/0900 MD. Pt verbalized understanding

## 2011-12-10 ENCOUNTER — Other Ambulatory Visit: Payer: Medicare Other | Admitting: Lab

## 2011-12-10 ENCOUNTER — Ambulatory Visit: Payer: Medicare Other | Admitting: Oncology

## 2011-12-12 ENCOUNTER — Encounter: Payer: Self-pay | Admitting: Oncology

## 2011-12-12 ENCOUNTER — Telehealth: Payer: Self-pay | Admitting: *Deleted

## 2011-12-12 ENCOUNTER — Ambulatory Visit (HOSPITAL_BASED_OUTPATIENT_CLINIC_OR_DEPARTMENT_OTHER): Payer: Medicare Other | Admitting: Oncology

## 2011-12-12 ENCOUNTER — Other Ambulatory Visit (HOSPITAL_BASED_OUTPATIENT_CLINIC_OR_DEPARTMENT_OTHER): Payer: Medicare Other | Admitting: Lab

## 2011-12-12 VITALS — BP 145/88 | HR 70 | Temp 98.8°F | Ht 65.5 in | Wt 191.6 lb

## 2011-12-12 DIAGNOSIS — D696 Thrombocytopenia, unspecified: Secondary | ICD-10-CM

## 2011-12-12 DIAGNOSIS — C50419 Malignant neoplasm of upper-outer quadrant of unspecified female breast: Secondary | ICD-10-CM

## 2011-12-12 DIAGNOSIS — D0590 Unspecified type of carcinoma in situ of unspecified breast: Secondary | ICD-10-CM | POA: Diagnosis not present

## 2011-12-12 DIAGNOSIS — C549 Malignant neoplasm of corpus uteri, unspecified: Secondary | ICD-10-CM

## 2011-12-12 DIAGNOSIS — Z170 Estrogen receptor positive status [ER+]: Secondary | ICD-10-CM

## 2011-12-12 DIAGNOSIS — C50919 Malignant neoplasm of unspecified site of unspecified female breast: Secondary | ICD-10-CM

## 2011-12-12 LAB — CBC WITH DIFFERENTIAL/PLATELET
Basophils Absolute: 0 10*3/uL (ref 0.0–0.1)
EOS%: 4.5 % (ref 0.0–7.0)
Eosinophils Absolute: 0.2 10*3/uL (ref 0.0–0.5)
HGB: 14.3 g/dL (ref 11.6–15.9)
NEUT#: 3 10*3/uL (ref 1.5–6.5)
RBC: 4.8 10*6/uL (ref 3.70–5.45)
RDW: 15.4 % — ABNORMAL HIGH (ref 11.2–14.5)
lymph#: 0.9 10*3/uL (ref 0.9–3.3)

## 2011-12-12 LAB — COMPREHENSIVE METABOLIC PANEL
AST: 22 U/L (ref 0–37)
Albumin: 4 g/dL (ref 3.5–5.2)
Alkaline Phosphatase: 52 U/L (ref 39–117)
BUN: 17 mg/dL (ref 6–23)
Calcium: 9.5 mg/dL (ref 8.4–10.5)
Chloride: 103 mEq/L (ref 96–112)
Glucose, Bld: 108 mg/dL — ABNORMAL HIGH (ref 70–99)
Potassium: 4 mEq/L (ref 3.5–5.3)
Sodium: 140 mEq/L (ref 135–145)
Total Protein: 6.2 g/dL (ref 6.0–8.3)

## 2011-12-12 NOTE — Progress Notes (Signed)
OFFICE PROGRESS NOTE  CC  [name] Manns, MD [address] [place] [address], [place] [address] Dr. Claud Kelp  DIAGNOSIS: 78 year old female with history of 0.6 cm DCIS of the right breast diagnosed May 2011  PRIOR THERAPY:  #1 patient underwent a lumpectomy on 12/02/2009 with sentinel node biopsy. Her final pathology revealed a DCIS grade 2 sentinel node was negative for metastatic disease.  #2 patient was then started on partial breast radiation with MammoSite in July 2011.  #3 patient did not go on to chemoprevention. But she has been on observation only.  #4 patient recently had aortic valve replacement surgery.  CURRENT THERAPY:Observation for breast cancer  INTERVAL HISTORY: [name] 78 y.o. female returns for Followup visit today. Her last visit with me was back in February 2012. She overall feels well she tolerated her cardiac surgery very well without any problems. Of note most recent CBC performed by Dr. Marrian Salvage revealed that patient was slightly thrombocytopenicWith platelet on 09/05/2011 at 141,000 on 423 it was 128,000 and today he is 126,000. All of her other blood counts were normal. She is without any bleeding problems she has no cold sore flus. She denies any nausea or vomiting. Patient does complain of having tenderness in the right breast at the incision site there is noted to be some fullness in this area which is on palpation tender to touch. It feels more like a scar tissue. She does have upcoming mammograms. She was seen by Dr. Claud Kelp who also recommended an ultrasound. Remainder of the 10 point review of systems is unremarkable.  MEDICAL HISTORY: Past Medical History  Diagnosis Date  . Family history of colonic polyps   . Other abnormal glucose   . Pain in joint, shoulder region   . Unspecified hearing loss   . Aortic stenosis     s/p tissue AVR 05/2011  . Alcohol abuse, unspecified   . Dyskinesia of esophagus   .  Diaphragmatic hernia without mention of obstruction or gangrene   . Tobacco use disorder   . Internal hemorrhoids without mention of complication   . Diverticulosis of colon (without mention of hemorrhage)   . Postmenopausal bleeding   . HTN (hypertension)   . HLD (hyperlipidemia)   . Malignant neoplasm of corpus uteri, except isthmus   . Breast CA     rt  . Arthritis     ALLERGIES:  is allergic to calcium; ezetimibe; ramipril; simvastatin; and valsartan.  MEDICATIONS:  Current Outpatient Prescriptions  Medication Sig Dispense Refill  . aspirin 325 MG tablet Take 325 mg by mouth. Take 1/2 tablet daily      . carvedilol (COREG) 25 MG tablet Take 1 tablet (25 mg total) by mouth 2 (two) times daily with a meal.  180 tablet  3  . Cholecalciferol (VITAMIN D) 1000 UNITS capsule Take 1,000 Units by mouth 2 (two) times daily.       . diphenhydrAMINE (BENADRYL) 50 MG tablet Take 50 mg by mouth at bedtime as needed.      . hydrochlorothiazide (HYDRODIURIL) 25 MG tablet Take 25 mg by mouth once.       . Omega-3 Fatty Acids (FISH OIL) 1000 MG CAPS Take 2,000 mg by mouth daily.       . potassium chloride SA (K-DUR,KLOR-CON) 20 MEQ tablet Take 1 tablet (20 mEq total) by mouth 2 (two) times daily.  180 tablet  3  . Psyllium (METAMUCIL PO) Take by mouth.      Marland Kitchen  Red Yeast Rice 600 MG CAPS Take 1,200 mg by mouth daily.        SURGICAL HISTORY:  Past Surgical History  Procedure Date  . Rotator cuff repair 05/2008    right  . Laparoscopy w/ total bilateral pelvic and peri-aortic lymphadenectomy 11/04/2007  . Total abdominal hysterectomy w/ bilateral salpingoophorectomy 11/04/07    salpingo-oophrectomy  . Cataract extraction     bil  . 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 Breast lumpectomy 11/2009    DCIS, neg sentinel lymph node biopsy  . Abdominal hysterectomy   . Aortic valve replacement 06/13/2011    Procedure: AORTIC VALVE REPLACEMENT (AVR);  Surgeon: [name], MD;  Location: [facility] OR;  Service:  Open Heart Surgery;  Laterality: N/A;  . Breast lumpectomy   . Shoulder surgery   . Abdominal hysterectomy   . Paraaortic lymphadenectomy     REVIEW OF SYSTEMS:  Pertinent items are noted in HPI.   PHYSICAL EXAMINATION: General appearance: alert, cooperative and appears stated age Lymph nodes: Cervical, supraclavicular, and axillary nodes normal. Resp: clear to auscultation bilaterally Back: symmetric, no curvature. ROM normal. No CVA tenderness. Cardio: regular rate and rhythm GI: soft, non-tender; bowel sounds normal; no masses,  no organomegaly Extremities: extremities normal, atraumatic, no cyanosis or edema Neurologic: Grossly normal Right breast exam: Healed surgical scar there is fullness in this region but tenderness on palpation. No other masses no nipple discharge retraction or inversion. Left breast no masses or nipple discharge.  ECOG PERFORMANCE STATUS: 1 - Symptomatic but completely ambulatory  Blood pressure 145/88, pulse 70, temperature 98.8 F (37.1 C), temperature source Oral, height 5' 5.5" (1.664 m), weight 191 lb 9.6 oz (86.909 kg), last menstrual period 05/29/2007.  LABORATORY DATA: Lab Results  Component Value Date   WBC 4.6 12/12/2011   HGB 14.3 12/12/2011   HCT 41.7 12/12/2011   MCV 86.8 12/12/2011   PLT 126* 12/12/2011      Chemistry      Component Value Date/Time   NA 141 09/18/2011 0835   K 4.1 09/18/2011 0835   CL 104 09/18/2011 0835   CO2 30 09/18/2011 0835   BUN 15 09/18/2011 0835   CREATININE 0.9 09/18/2011 0835      Component Value Date/Time   CALCIUM 9.6 09/18/2011 0835   ALKPHOS 54 09/18/2011 0835   AST 25 09/18/2011 0835   ALT 25 09/18/2011 0835   BILITOT 0.2* 09/18/2011 0835       RADIOGRAPHIC STUDIES:  No results found.  ASSESSMENT: 78 year old female with  #1 ductal carcinoma in situ of the right breast measuring 0.6 cm diagnosed May 2011. She had a lumpectomy on 12/02/2009 that showed a grade 2 DCIS she underwent MammoSite treatment.  She declined tamoxifen or an aromatase inhibitor. She is on observation only.  #2 thrombocytopenia: Patient prior had had normal counts but starting in April of this year she developed mild thrombocytopenia. Her cardiac surgery was performed in January for the aortic valve replacement. I do suspect that her thrombocytopenia could be related to the fact or she could possibly have early ITP. At this time she is asymptomatic and observation and serial platelet count may be appropriate.   PLAN:   #1 patient will continue to be seen every 6 months. At her next visit I will draw a CBC. She will proceed with getting her mammograms performed in August. We will continue to monitor her her platelets.  #2 she will continue to pace followed by Dr.  [place] as well as Dr. Derrell Lolling.   All questions were answered. The patient knows to call the clinic with any problems, questions or concerns. We can certainly see the patient much sooner if necessary.  I spent 25 minutes counseling the patient face to face. The total time spent in the appointment was 30 minutes.    Drue Second, MD Medical/Oncology [facility] [phone] (beeper) [phone] (Office)  12/12/2011, 9:06 AM

## 2011-12-12 NOTE — Patient Instructions (Addendum)
1. Breast cancer: doing well, keep your appointment for mammograms  2. Low Platelets count: platelets still slightly low but not dangerously, we will continue to monitor for now  3. I will see you back in 6 months

## 2011-12-12 NOTE — Telephone Encounter (Signed)
gave patient appointment for 06-16-2012 starting at 8:30am printed out calendar and gave to the patient

## 2011-12-18 ENCOUNTER — Telehealth: Payer: Self-pay

## 2011-12-18 NOTE — Telephone Encounter (Signed)
Pt scheduled for CBC with diff 12/24/11. [name] at Cancer center did CBC with diff 12/12/11(results under lab tab) Pt wants to know if can cancel CBS with diff 12/24/11.Please advise.

## 2011-12-18 NOTE — Telephone Encounter (Signed)
Yes- go ahead and cancel it , thanks for the heads up  Platelet ct was stable

## 2011-12-20 NOTE — Telephone Encounter (Signed)
Informed patient

## 2011-12-24 ENCOUNTER — Other Ambulatory Visit: Payer: Medicare Other

## 2012-01-21 ENCOUNTER — Other Ambulatory Visit: Payer: Self-pay | Admitting: Family Medicine

## 2012-01-21 DIAGNOSIS — Z853 Personal history of malignant neoplasm of breast: Secondary | ICD-10-CM

## 2012-01-22 DIAGNOSIS — Z961 Presence of intraocular lens: Secondary | ICD-10-CM | POA: Diagnosis not present

## 2012-01-22 DIAGNOSIS — H52209 Unspecified astigmatism, unspecified eye: Secondary | ICD-10-CM | POA: Diagnosis not present

## 2012-01-23 ENCOUNTER — Ambulatory Visit
Admission: RE | Admit: 2012-01-23 | Discharge: 2012-01-23 | Disposition: A | Discharge status: Home / Self Care | Payer: Medicare Other | Source: Ambulatory Visit | Attending: Family Medicine | Admitting: Family Medicine

## 2012-01-23 ENCOUNTER — Ambulatory Visit
Admission: RE | Admit: 2012-01-23 | Discharge: 2012-01-23 | Disposition: A | Discharge status: Home / Self Care | Payer: Medicare Other | Source: Ambulatory Visit | Attending: General Surgery | Admitting: General Surgery

## 2012-01-23 DIAGNOSIS — Z853 Personal history of malignant neoplasm of breast: Secondary | ICD-10-CM | POA: Diagnosis not present

## 2012-01-23 DIAGNOSIS — C50919 Malignant neoplasm of unspecified site of unspecified female breast: Secondary | ICD-10-CM

## 2012-01-23 DIAGNOSIS — N644 Mastodynia: Secondary | ICD-10-CM | POA: Diagnosis not present

## 2012-01-26 ENCOUNTER — Other Ambulatory Visit: Payer: Self-pay | Admitting: Cardiovascular Disease

## 2012-01-29 NOTE — Telephone Encounter (Signed)
Opened in error

## 2012-03-17 ENCOUNTER — Telehealth: Payer: Self-pay | Admitting: Family Medicine

## 2012-03-17 DIAGNOSIS — R7309 Other abnormal glucose: Secondary | ICD-10-CM

## 2012-03-17 DIAGNOSIS — M949 Disorder of cartilage, unspecified: Secondary | ICD-10-CM

## 2012-03-17 DIAGNOSIS — E559 Vitamin D deficiency, unspecified: Secondary | ICD-10-CM

## 2012-03-17 DIAGNOSIS — E785 Hyperlipidemia, unspecified: Secondary | ICD-10-CM

## 2012-03-17 DIAGNOSIS — I10 Essential (primary) hypertension: Secondary | ICD-10-CM

## 2012-03-17 DIAGNOSIS — D696 Thrombocytopenia, unspecified: Secondary | ICD-10-CM

## 2012-03-17 NOTE — Telephone Encounter (Signed)
Message copied by Judy Pimple on Mon Mar 17, 2012  7:56 AM ------      Message from: Alvina Chou      Created: Fri Mar 14, 2012  4:27 PM      Regarding: Lab orders for Tuesday, 10.22.13       Patient is scheduled for CPX labs, please order future labs, Thanks , Camelia Eng

## 2012-03-18 ENCOUNTER — Other Ambulatory Visit (INDEPENDENT_AMBULATORY_CARE_PROVIDER_SITE_OTHER): Payer: Medicare Other

## 2012-03-18 DIAGNOSIS — I10 Essential (primary) hypertension: Secondary | ICD-10-CM

## 2012-03-18 DIAGNOSIS — E559 Vitamin D deficiency, unspecified: Secondary | ICD-10-CM

## 2012-03-18 DIAGNOSIS — D696 Thrombocytopenia, unspecified: Secondary | ICD-10-CM | POA: Diagnosis not present

## 2012-03-18 DIAGNOSIS — M899 Disorder of bone, unspecified: Secondary | ICD-10-CM | POA: Diagnosis not present

## 2012-03-18 DIAGNOSIS — E785 Hyperlipidemia, unspecified: Secondary | ICD-10-CM | POA: Diagnosis not present

## 2012-03-18 DIAGNOSIS — R7309 Other abnormal glucose: Secondary | ICD-10-CM

## 2012-03-18 LAB — CBC WITH DIFFERENTIAL/PLATELET
Basophils Absolute: 0 10*3/uL (ref 0.0–0.1)
Eosinophils Absolute: 0 10*3/uL (ref 0.0–0.7)
Hemoglobin: 14.2 g/dL (ref 12.0–15.0)
Lymphocytes Relative: 22.3 % (ref 12.0–46.0)
MCHC: 33.6 g/dL (ref 30.0–36.0)
Monocytes Relative: 8.5 % (ref 3.0–12.0)
Neutro Abs: 2.8 10*3/uL (ref 1.4–7.7)
Neutrophils Relative %: 68.5 % (ref 43.0–77.0)
RDW: 13.4 % (ref 11.5–14.6)

## 2012-03-18 LAB — LIPID PANEL
Total CHOL/HDL Ratio: 5
Triglycerides: 84 mg/dL (ref 0.0–149.0)

## 2012-03-18 LAB — HEMOGLOBIN A1C: Hgb A1c MFr Bld: 5.5 % (ref 4.6–6.5)

## 2012-03-18 LAB — COMPREHENSIVE METABOLIC PANEL
ALT: 25 U/L (ref 0–35)
AST: 21 U/L (ref 0–37)
Albumin: 3.6 g/dL (ref 3.5–5.2)
Alkaline Phosphatase: 44 U/L (ref 39–117)
Calcium: 9.2 mg/dL (ref 8.4–10.5)
Chloride: 102 mEq/L (ref 96–112)
Potassium: 3.8 mEq/L (ref 3.5–5.1)
Sodium: 138 mEq/L (ref 135–145)
Total Protein: 6.2 g/dL (ref 6.0–8.3)

## 2012-03-19 LAB — LDL CHOLESTEROL, DIRECT: Direct LDL: 162.4 mg/dL

## 2012-03-19 LAB — VITAMIN D 25 HYDROXY (VIT D DEFICIENCY, FRACTURES): Vit D, 25-Hydroxy: 53 ng/mL (ref 30–89)

## 2012-03-20 LAB — TSH: TSH: 1.31 u[IU]/mL (ref 0.35–5.50)

## 2012-03-25 ENCOUNTER — Ambulatory Visit (INDEPENDENT_AMBULATORY_CARE_PROVIDER_SITE_OTHER): Payer: Medicare Other | Admitting: Family Medicine

## 2012-03-25 ENCOUNTER — Encounter: Payer: Self-pay | Admitting: Family Medicine

## 2012-03-25 VITALS — BP 142/82 | HR 64 | Temp 98.5°F | Ht 66.0 in | Wt 186.0 lb

## 2012-03-25 DIAGNOSIS — E559 Vitamin D deficiency, unspecified: Secondary | ICD-10-CM | POA: Diagnosis not present

## 2012-03-25 DIAGNOSIS — I10 Essential (primary) hypertension: Secondary | ICD-10-CM

## 2012-03-25 DIAGNOSIS — M899 Disorder of bone, unspecified: Secondary | ICD-10-CM | POA: Diagnosis not present

## 2012-03-25 DIAGNOSIS — D696 Thrombocytopenia, unspecified: Secondary | ICD-10-CM | POA: Diagnosis not present

## 2012-03-25 DIAGNOSIS — Z23 Encounter for immunization: Secondary | ICD-10-CM

## 2012-03-25 DIAGNOSIS — E785 Hyperlipidemia, unspecified: Secondary | ICD-10-CM

## 2012-03-25 DIAGNOSIS — R7309 Other abnormal glucose: Secondary | ICD-10-CM

## 2012-03-25 DIAGNOSIS — Z9181 History of falling: Secondary | ICD-10-CM

## 2012-03-25 NOTE — Patient Instructions (Addendum)
Please send for last bone density test report from [facility]  Please take up rugs in your house and fix anything needed to prevent falls  Flu shot today Avoid red meat/ fried foods/ egg yolks/ fatty breakfast meats/ butter, cheese and high fat dairy/ and shellfish

## 2012-03-25 NOTE — Progress Notes (Signed)
Subjective:    Patient ID: [name], female    DOB: [birth year], 78 y.o.   MRN: [id number]  HPI Here for check up of chronic medical conditions and to review health mt list   Nothing new going on  Balance is not as good as it used to be - is very careful   Wt is down 5 lb- is watching her diet  Needs to get some exercise and get wt off   (tries to go in the ams) She does care for her husband a fair amount but can get out and exercise Flu shot today- got that   Osteopenia dexa 2/08 in our record- oncology cares for this - she has had more recent ones - 3-4 years  No meds for OP D level is 53- good   Thrombocytopenia Followed by heme Lab Results  Component Value Date   WBC 4.1* 03/18/2012   HGB 14.2 03/18/2012   HCT 42.3 03/18/2012   MCV 89.9 03/18/2012   PLT 131.0* 03/18/2012   no easy bruising or bleeding   mammo 8/13- stable and also did an ultrasound where lumpectomy was  Hx of breast ca  Self exam-no lumps or changes  Sees Dr Park Breed and also Dr Derrell Lolling (last exam was in May) She does not want exam today   colonosc 3/10- unsure of recall- found one polyp Sister had polyps   Hx of hyst  Still sees Dr Chestine SporeSharol Given- has that this upcoming Friday- every 6 months and saw Dr Shawnie Pons in April (has 2 paps per year) Has had cervical cuff paps due to hx of ca   Lipid Lab Results  Component Value Date   CHOL 212* 03/18/2012   CHOL 171 09/18/2011   CHOL 133 03/22/2011   Lab Results  Component Value Date   HDL 45.40 03/18/2012   HDL 49.40 09/18/2011   HDL 40.98 03/22/2011   Lab Results  Component Value Date   LDLCALC 104* 09/18/2011   LDLCALC 72 03/22/2011   LDLCALC 80 09/18/2010   Lab Results  Component Value Date   TRIG 84.0 03/18/2012   TRIG 89.0 09/18/2011   TRIG 53.0 03/22/2011   Lab Results  Component Value Date   CHOLHDL 5 03/18/2012   CHOLHDL 3 09/18/2011   CHOLHDL 3 03/22/2011   Lab Results  Component Value Date   LDLDIRECT 162.4 03/18/2012   LDLDIRECT 147.6 03/16/2010   cannot take simvastatin, pravachol and zetia  She stopped red yeast rice 3 wk ago- ? Muscle pain too  LDL direct is gradually going up - pt says she has been a bit lax with diet  Eats too much butter (can get rid of that) , no other fatty dairy  Beef is seldom  No shellfish No fried foods    Hyperglycemia Watching diet Lab Results  Component Value Date   HGBA1C 5.5 03/18/2012     Patient Active Problem List  Diagnosis  . Malignant neoplasm of corpus uteri, except isthmus  . VITAMIN D DEFICIENCY  . HYPERLIPIDEMIA  . HEARING LOSS, LEFT EAR  . HYPERTENSION  . AORTIC STENOSIS  . HEMORRHOIDS, INTERNAL  . EXTERNAL HEMORRHOIDS  . ESOPHAGEAL SPASM  . HIATAL HERNIA  . DIVERTICULOSIS, COLON  . CONSTIPATION  . OSTEOPENIA  . DEPENDENT EDEMA, LEGS  . CAROTID BRUITS, BILATERAL  . HYPERGLYCEMIA  . Dyspepsia  . Near syncope  . Breast cancer, stage 2  . Thrombocytopenia   Past Medical History  Diagnosis Date  . Family  history of colonic polyps   . Other abnormal glucose   . Pain in joint, shoulder region   . Unspecified hearing loss   . Aortic stenosis     s/p tissue AVR 05/2011  . Alcohol abuse, unspecified   . Dyskinesia of esophagus   . Diaphragmatic hernia without mention of obstruction or gangrene   . Tobacco use disorder   . Internal hemorrhoids without mention of complication   . Diverticulosis of colon (without mention of hemorrhage)   . Postmenopausal bleeding   . HTN (hypertension)   . HLD (hyperlipidemia)   . Malignant neoplasm of corpus uteri, except isthmus   . Breast CA     rt  . Arthritis    Past Surgical History  Procedure Date  . Rotator cuff repair 05/2008    right  . Laparoscopy w/ total bilateral pelvic and peri-aortic lymphadenectomy 11/04/2007  . Total abdominal hysterectomy w/ bilateral salpingoophorectomy 11/04/07    salpingo-oophrectomy  . Cataract extraction     bil  . 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 Breast  lumpectomy 11/2009    DCIS, neg sentinel lymph node biopsy  . Abdominal hysterectomy   . Aortic valve replacement 06/13/2011    Procedure: AORTIC VALVE REPLACEMENT (AVR);  Surgeon: [name], MD;  Location: [facility] OR;  Service: Open Heart Surgery;  Laterality: N/A;  . Breast lumpectomy   . Shoulder surgery   . Abdominal hysterectomy   . Paraaortic lymphadenectomy    History  Substance Use Topics  . Smoking status: Former Smoker -- 1.0 packs/day    Quit date: 03/28/2000  . Smokeless tobacco: Not on file  . Alcohol Use: No   Family History  Problem Relation Age of Onset  . Colon polyps Sister     2010  . Cancer Maternal Aunt     possible post menopausal breast cancer  . Heart disease Mother   . Stroke Father    Allergies  Allergen Reactions  . Calcium     REACTION: constipation  . Ezetimibe     REACTION: fatigue  . Ramipril     REACTION: abdominal pain  . Simvastatin     REACTION: leg pain and body aches  . Valsartan     REACTION: abdominal pain   Current Outpatient Prescriptions on File Prior to Visit  Medication Sig Dispense Refill  . aspirin 325 MG tablet Take 325 mg by mouth. Take 1/2 tablet daily      . carvedilol (COREG) 25 MG tablet Take 1 tablet (25 mg total) by mouth 2 (two) times daily with a meal.  180 tablet  3  . Cholecalciferol (VITAMIN D) 1000 UNITS capsule Take 1,000 Units by mouth 2 (two) times daily.       . diphenhydrAMINE (BENADRYL) 50 MG tablet Take 50 mg by mouth at bedtime as needed.      . hydrochlorothiazide (HYDRODIURIL) 25 MG tablet TAKE 1 TABLET DAILY  90 tablet  2  . Omega-3 Fatty Acids (FISH OIL) 1000 MG CAPS Take 2,000 mg by mouth daily.       . potassium chloride SA (K-DUR,KLOR-CON) 20 MEQ tablet Take 1 tablet (20 mEq total) by mouth 2 (two) times daily.  180 tablet  3  . Psyllium (METAMUCIL PO) Take by mouth.        Review of Systems Review of Systems  Constitutional: Negative for fever, appetite change, fatigue and unexpected weight  change.  Eyes: Negative for pain and visual disturbance.  Respiratory: Negative for cough and shortness of breath.   Cardiovascular: Negative for cp or palpitations    Gastrointestinal: Negative for nausea, diarrhea and constipation.  Genitourinary: Negative for urgency and frequency.  Skin: Negative for pallor or rash   Neurological: Negative for weakness, light-headedness, numbness and headaches. neg for falls/ pos for decrease in balance Hematological: Negative for adenopathy. Does not bruise/bleed easily.  Psychiatric/Behavioral: Negative for dysphoric mood. The patient is not nervous/anxious.         Objective:   Physical Exam  Constitutional: She appears well-developed and well-nourished. No distress.       overwt and well appearing   HENT:  Head: Normocephalic and atraumatic.  Right Ear: External ear normal.  Left Ear: External ear normal.  Nose: Nose normal.  Mouth/Throat: Oropharynx is clear and moist.  Eyes: Conjunctivae normal and EOM are normal. Pupils are equal, round, and reactive to light. Right eye exhibits no discharge. Left eye exhibits no discharge. No scleral icterus.  Neck: Normal range of motion. No JVD present. Carotid bruit is not present. No thyromegaly present.  Cardiovascular: Normal rate, regular rhythm, normal heart sounds and intact distal pulses.  Exam reveals no gallop.   Pulmonary/Chest: Effort normal and breath sounds normal. No respiratory distress. She has no wheezes.  Abdominal: Soft. Bowel sounds are normal. She exhibits no distension and no mass. There is no tenderness.  Musculoskeletal: She exhibits no edema and no tenderness.  Lymphadenopathy:    She has no cervical adenopathy.  Neurological: She is alert. She has normal reflexes. No cranial nerve deficit. She exhibits normal muscle tone. Coordination normal.  Skin: Skin is warm and dry. No rash noted. No erythema. No pallor.  Psychiatric: She has a normal mood and affect.            Assessment & Plan:

## 2012-03-28 ENCOUNTER — Ambulatory Visit: Payer: Medicare Other | Attending: Gynecology | Admitting: Gynecology

## 2012-03-28 ENCOUNTER — Encounter: Payer: Self-pay | Admitting: Gynecology

## 2012-03-28 VITALS — BP 122/70 | HR 70 | Temp 98.6°F | Resp 20 | Ht 66.3 in | Wt 194.2 lb

## 2012-03-28 DIAGNOSIS — Z8542 Personal history of malignant neoplasm of other parts of uterus: Secondary | ICD-10-CM | POA: Diagnosis not present

## 2012-03-28 DIAGNOSIS — Z954 Presence of other heart-valve replacement: Secondary | ICD-10-CM | POA: Diagnosis not present

## 2012-03-28 DIAGNOSIS — C55 Malignant neoplasm of uterus, part unspecified: Secondary | ICD-10-CM

## 2012-03-28 DIAGNOSIS — Z823 Family history of stroke: Secondary | ICD-10-CM | POA: Diagnosis not present

## 2012-03-28 DIAGNOSIS — Z8249 Family history of ischemic heart disease and other diseases of the circulatory system: Secondary | ICD-10-CM | POA: Insufficient documentation

## 2012-03-28 DIAGNOSIS — Z83719 Family history of colon polyps, unspecified: Secondary | ICD-10-CM | POA: Insufficient documentation

## 2012-03-28 DIAGNOSIS — Z87891 Personal history of nicotine dependence: Secondary | ICD-10-CM | POA: Insufficient documentation

## 2012-03-28 DIAGNOSIS — Z90710 Acquired absence of both cervix and uterus: Secondary | ICD-10-CM | POA: Diagnosis not present

## 2012-03-28 DIAGNOSIS — I10 Essential (primary) hypertension: Secondary | ICD-10-CM | POA: Diagnosis not present

## 2012-03-28 DIAGNOSIS — Z7982 Long term (current) use of aspirin: Secondary | ICD-10-CM | POA: Diagnosis not present

## 2012-03-28 DIAGNOSIS — Z809 Family history of malignant neoplasm, unspecified: Secondary | ICD-10-CM | POA: Insufficient documentation

## 2012-03-28 DIAGNOSIS — Z8371 Family history of colonic polyps: Secondary | ICD-10-CM | POA: Insufficient documentation

## 2012-03-28 DIAGNOSIS — C549 Malignant neoplasm of corpus uteri, unspecified: Secondary | ICD-10-CM | POA: Diagnosis not present

## 2012-03-28 DIAGNOSIS — Z09 Encounter for follow-up examination after completed treatment for conditions other than malignant neoplasm: Secondary | ICD-10-CM | POA: Insufficient documentation

## 2012-03-28 DIAGNOSIS — Z9079 Acquired absence of other genital organ(s): Secondary | ICD-10-CM | POA: Diagnosis not present

## 2012-03-28 DIAGNOSIS — Z853 Personal history of malignant neoplasm of breast: Secondary | ICD-10-CM | POA: Insufficient documentation

## 2012-03-28 NOTE — Assessment & Plan Note (Signed)
Lipids are high-pt intol of all meds Disc goals for lipids and reasons to control them Rev labs with pt Rev low sat fat diet in detail

## 2012-03-28 NOTE — Assessment & Plan Note (Signed)
Stable and very well controlled  Urged to stick with a low glycemic diet

## 2012-03-28 NOTE — Progress Notes (Signed)
Consult Note: Gyn-Onc   [name] 78 y.o. female  Chief Complaint  Patient presents with  . Uterine cancer    Follow up    Interval History: The patient returns today as regularly scheduled for followup. Since her last visit she has seen Dr. Shawnie Pons who felt that things are going well. Patient reports she has no GI GU or pelvic symptoms. She specifically denies any pelvic pain or vaginal bleeding or discharge. She did have an aortic valve replacement in January 2013.  HPI: Stage IB grade 2 endometrial cancer undergoing total abdominal hysterectomy bilateral salpingo-oophorectomy pelvic and para-aortic lymphadenectomy June 2009 no adjuvant therapy was required. Patient followed with no evidence of recurrent disease.  Review of Systems:10 point review of systems is negative as noted above.   Vitals: Blood pressure 122/70, pulse 70, temperature 98.6 F (37 C), temperature source Oral, resp. rate 20, height 5' 6.3" (1.684 m), weight 194 lb 3.2 oz (88.089 kg), last menstrual period 05/29/2007.  Physical Exam: General : The patient is a healthy woman in no acute distress.  HEENT: normocephalic, extraoccular movements normal; neck is supple without thyromegally  Lynphnodes: Supraclavicular and inguinal nodes not enlarged  Abdomen: Soft, non-tender, no ascites, no organomegally, no masses, no hernias  Pelvic:  EGBUS: Normal female  Vagina: Normal, no lesions  Urethra and Bladder: Normal, non-tender  Cervix: Surgically absent  Uterus: Surgically absent  Bi-manual examination: Non-tender; no adenxal masses or nodularity  Rectal: normal sphincter tone, no masses, no blood  Lower extremities: Bilateral 1+ edema of the lower extremities.. Normal range of motion    Assessment/Plan: Stage IB grade 2 endometrial adenocarcinoma 2009. Patient's clinically free of disease. Based on new guidelines we will not repeat a Pap smear today. She returned to see Dr. Shawnie Pons in 6 months and return to see [place]  in one year Allergies  Allergen Reactions  . Calcium     REACTION: constipation  . Ezetimibe     REACTION: fatigue  . Ramipril     REACTION: abdominal pain  . Simvastatin     REACTION: leg pain and body aches  . Valsartan     REACTION: abdominal pain    Past Medical History  Diagnosis Date  . Family history of colonic polyps   . Other abnormal glucose   . Pain in joint, shoulder region   . Unspecified hearing loss   . Aortic stenosis     s/p tissue AVR 05/2011  . Alcohol abuse, unspecified   . Dyskinesia of esophagus   . Diaphragmatic hernia without mention of obstruction or gangrene   . Tobacco use disorder   . Internal hemorrhoids without mention of complication   . Diverticulosis of colon (without mention of hemorrhage)   . Postmenopausal bleeding   . HTN (hypertension)   . HLD (hyperlipidemia)   . Malignant neoplasm of corpus uteri, except isthmus   . Breast CA     rt  . Arthritis     Past Surgical History  Procedure Date  . Rotator cuff repair 05/2008    right  . Laparoscopy w/ total bilateral pelvic and peri-aortic lymphadenectomy 11/04/2007  . Total abdominal hysterectomy w/ bilateral salpingoophorectomy 11/04/07    salpingo-oophrectomy  . Cataract extraction     bil  . 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 Breast lumpectomy 11/2009    DCIS, neg sentinel lymph node biopsy  . Abdominal hysterectomy   . Aortic valve replacement 06/13/2011    Procedure: AORTIC VALVE  REPLACEMENT (AVR);  Surgeon: [name], MD;  Location: [facility] OR;  Service: Open Heart Surgery;  Laterality: N/A;  . Breast lumpectomy   . Shoulder surgery   . Abdominal hysterectomy   . Paraaortic lymphadenectomy     Current Outpatient Prescriptions  Medication Sig Dispense Refill  . aspirin 325 MG tablet Take 325 mg by mouth. Take 1/2 tablet daily      . carvedilol (COREG) 25 MG tablet Take 1 tablet (25 mg total) by mouth 2 (two) times daily with a meal.  180 tablet  3  . Cholecalciferol (VITAMIN D)  1000 UNITS capsule Take 1,000 Units by mouth 2 (two) times daily.       . diphenhydrAMINE (BENADRYL) 50 MG tablet Take 50 mg by mouth at bedtime as needed.      . hydrochlorothiazide (HYDRODIURIL) 25 MG tablet TAKE 1 TABLET DAILY  90 tablet  2  . Omega-3 Fatty Acids (FISH OIL) 1000 MG CAPS Take 2,000 mg by mouth daily.       . potassium chloride SA (K-DUR,KLOR-CON) 20 MEQ tablet Take 1 tablet (20 mEq total) by mouth 2 (two) times daily.  180 tablet  3  . Psyllium (METAMUCIL PO) Take by mouth.        History   Social History  . Marital Status: Married    Spouse Name: N/A    Number of Children: N/A  . Years of Education: N/A   Occupational History  . Not on file.   Social History Main Topics  . Smoking status: Former Smoker -- 1.0 packs/day    Quit date: 03/28/2000  . Smokeless tobacco: Not on file  . Alcohol Use: No  . Drug Use: No  . Sexually Active: No   Other Topics Concern  . Not on file   Social History Narrative  . No narrative on file    Family History  Problem Relation Age of Onset  . Colon polyps Sister     2010  . Cancer Maternal Aunt     possible post menopausal breast cancer  . Heart disease Mother   . Stroke Father       Jeannette Corpus, MD 03/28/2012, 10:40 AM

## 2012-03-28 NOTE — Assessment & Plan Note (Signed)
bp in fair control at this time  No changes needed  Disc lifstyle change with low sodium diet and exercise  Labs reviewed  

## 2012-03-28 NOTE — Assessment & Plan Note (Signed)
Disc safety with declining balance with age  Adv to take up all rugs in house and avoid walking on irreg or slippery Will use walker when appropriate

## 2012-03-28 NOTE — Assessment & Plan Note (Signed)
Level now theraputic Will continue vit D dose  Disc imp to bone and overall health

## 2012-03-28 NOTE — Assessment & Plan Note (Signed)
Lab Results  Component Value Date   WBC 4.1* 03/18/2012   HGB 14.2 03/18/2012   HCT 42.3 03/18/2012   MCV 89.9 03/18/2012   PLT 131.0* 03/18/2012   this is stable with no symptoms Oncology is keeping an eye on it

## 2012-03-28 NOTE — Assessment & Plan Note (Signed)
Will send for last dexa Disc safety and ca and D No falls or fractures

## 2012-03-28 NOTE — Patient Instructions (Signed)
Please see Dr. Shawnie Pons in 6 months and return to see [place] in one year

## 2012-06-16 ENCOUNTER — Other Ambulatory Visit: Payer: Medicare Other | Admitting: Lab

## 2012-06-16 ENCOUNTER — Ambulatory Visit: Payer: Medicare Other | Admitting: Oncology

## 2012-06-17 ENCOUNTER — Ambulatory Visit (INDEPENDENT_AMBULATORY_CARE_PROVIDER_SITE_OTHER): Payer: Medicare Other | Admitting: Cardiovascular Disease

## 2012-06-17 ENCOUNTER — Encounter: Payer: Self-pay | Admitting: Cardiovascular Disease

## 2012-06-17 VITALS — BP 182/84 | HR 62 | Ht 66.0 in | Wt 198.4 lb

## 2012-06-17 DIAGNOSIS — I359 Nonrheumatic aortic valve disorder, unspecified: Secondary | ICD-10-CM | POA: Diagnosis not present

## 2012-06-17 DIAGNOSIS — I10 Essential (primary) hypertension: Secondary | ICD-10-CM | POA: Diagnosis not present

## 2012-06-17 MED ORDER — CARVEDILOL 25 MG PO TABS: 25.0000 mg | ORAL_TABLET | Freq: Two times a day (BID) | ORAL | Status: DC

## 2012-06-17 NOTE — Patient Instructions (Addendum)
Your physician wants you to follow-up in: 6 MONTHS with Dr Excell Seltzer.  You will receive a reminder letter in the mail two months in advance. If you don't receive a letter, please call our office to schedule the follow-up appointment.  Your physician has requested that you regularly monitor and record your blood pressure readings at home. Please use the same machine at the same time of day to check your readings and record them to bring to your follow-up visit. Please call the office if your BP is consistently above 150/90.  Your physician recommends that you continue on your current medications as directed. Please refer to the Current Medication list given to you today.

## 2012-06-17 NOTE — Progress Notes (Signed)
HPI:  82 six-year-old woman presenting for followup evaluation. The patient underwent aortic valve replacement with a bioprosthetic valve in January 2013. Her postoperative echo demonstrated expected gradients following surgery. She has normal left ventricular function. Last lipids were checked in October 2013 with a cholesterol of 212, HDL 45, triglycerides 84, and LDL 162. She is statin intolerant.  The patient has been under a lot of stress. She's been trying to find medication prescription coverage for her husband and this is been difficult with all the recent insurance changes. From a symptomatic perspective, she is doing well. She denies chest pain or pressure, dyspnea, or lightheadedness. She has chronic leg swelling is unchanged over time. She has some palpitations at rest, but no sustained symptoms. She notes occasional home blood pressure readings over 150, and she takes diltiazem when this occurs. Otherwise her blood pressure has been controlled. Her overall energy level is reduced but this is also a chronic problem.  Outpatient Encounter Prescriptions as of 06/17/2012  Medication Sig Dispense Refill  . aspirin 325 MG tablet Take 325 mg by mouth. Take 1/2 tablet daily      . carvedilol (COREG) 25 MG tablet Take 1 tablet (25 mg total) by mouth 2 (two) times daily with a meal.  180 tablet  3  . Cholecalciferol (VITAMIN D) 1000 UNITS capsule Take 1,000 Units by mouth 2 (two) times daily.       . diphenhydrAMINE (BENADRYL) 50 MG tablet Take 50 mg by mouth at bedtime as needed.      . hydrochlorothiazide (HYDRODIURIL) 25 MG tablet TAKE 1 TABLET DAILY  90 tablet  2  . Omega-3 Fatty Acids (FISH OIL) 1000 MG CAPS Take 2,000 mg by mouth daily.       . potassium chloride SA (K-DUR,KLOR-CON) 20 MEQ tablet Take 1 tablet (20 mEq total) by mouth 2 (two) times daily.  180 tablet  3  . Psyllium (METAMUCIL PO) Take by mouth.        Allergies  Allergen Reactions  . Calcium     REACTION: constipation    . Ezetimibe     REACTION: fatigue  . Ramipril     REACTION: abdominal pain  . Simvastatin     REACTION: leg pain and body aches  . Valsartan     REACTION: abdominal pain    Past Medical History  Diagnosis Date  . Family history of colonic polyps   . Other abnormal glucose   . Pain in joint, shoulder region   . Unspecified hearing loss   . Aortic stenosis     s/p tissue AVR 05/2011  . Alcohol abuse, unspecified   . Dyskinesia of esophagus   . Diaphragmatic hernia without mention of obstruction or gangrene   . Tobacco use disorder   . Internal hemorrhoids without mention of complication   . Diverticulosis of colon (without mention of hemorrhage)   . Postmenopausal bleeding   . HTN (hypertension)   . HLD (hyperlipidemia)   . Malignant neoplasm of corpus uteri, except isthmus   . Breast CA     rt  . Arthritis    ROS: Negative except as per HPI  BP 182/84  Pulse 62  Ht 5\' 6"  (1.676 m)  Wt 89.994 kg (198 lb 6.4 oz)  BMI 32.02 kg/m2  LMP 05/29/2007  PHYSICAL EXAM: Pt is alert and oriented, pleasant elderly woman in NAD HEENT: normal Neck: JVP - normal, carotids 2+= with bilateral bruits Lungs: CTA bilaterally CV: RRR with grade  2/6 systolic murmur at the left sternal border Abd: soft, NT, Positive BS, no hepatomegaly Ext: Trace pretibial edema bilaterally, distal pulses intact and equal Skin: warm/dry no rash  EKG:  Normal sinus rhythm 62 beats per minute, left anterior fascicular block, left ventricular hypertrophy.  ASSESSMENT AND PLAN: 1. Severe aortic stenosis, status post bioprosthetic aortic valve replacement. The patient remained stable. Her postoperative echo was reviewed. She should followup in 6 months. I encouraged her to resume a regular exercise program.  2. Essential hypertension. I rechecked the patient's blood pressure and obtained a similar reading of 185/90. Her recent office visits were reviewed and her blood pressure has otherwise been controlled.  Home blood pressures have also been much better controlled. I suspect this is an isolated reading. I asked her to call us if her home readings are consistently greater than 150/90. The patient monitors her blood pressure regularly. She tolerates diltiazem and I would probably start her back on daily diltiazem if blood pressure is consistently elevated. She is intolerant to ACE inhibitors and ARB's.  3. Hyperlipidemia. The patient is intolerant to statin drugs and ezetimibe. Lifestyle modification was reviewed.  [name] 06/17/2012 9:28 AM

## 2012-06-19 ENCOUNTER — Encounter: Payer: Self-pay | Admitting: Oncology

## 2012-06-19 ENCOUNTER — Other Ambulatory Visit (HOSPITAL_BASED_OUTPATIENT_CLINIC_OR_DEPARTMENT_OTHER): Payer: Medicare Other | Admitting: Lab

## 2012-06-19 ENCOUNTER — Ambulatory Visit (HOSPITAL_BASED_OUTPATIENT_CLINIC_OR_DEPARTMENT_OTHER): Payer: No Typology Code available for payment source | Admitting: Oncology

## 2012-06-19 VITALS — BP 159/83

## 2012-06-19 DIAGNOSIS — D0590 Unspecified type of carcinoma in situ of unspecified breast: Secondary | ICD-10-CM

## 2012-06-19 DIAGNOSIS — D696 Thrombocytopenia, unspecified: Secondary | ICD-10-CM

## 2012-06-19 LAB — CBC WITH DIFFERENTIAL/PLATELET
EOS%: 4.6 % (ref 0.0–7.0)
Eosinophils Absolute: 0.2 10*3/uL (ref 0.0–0.5)
MCV: 87.3 fL (ref 79.5–101.0)
MONO%: 10.5 % (ref 0.0–14.0)
NEUT#: 3.4 10*3/uL (ref 1.5–6.5)
RBC: 4.96 10*6/uL (ref 3.70–5.45)
RDW: 13.8 % (ref 11.2–14.5)
lymph#: 1.2 10*3/uL (ref 0.9–3.3)

## 2012-06-19 NOTE — Progress Notes (Signed)
OFFICE PROGRESS NOTE  CC  [name], MD [address] [place] [address], [place] [address] Dr. 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 Her final pathology revealed a DCIS grade 2 sentinel node was negative for metastatic disease.  #2 patient was then started on partial breast radiation with MammoSite in July 2011.  #3 patient did not go on to chemoprevention. But she has been on observation only.  #4 patient recently had aortic valve replacement surgery.  CURRENT THERAPY:Observation for breast cancer  INTERVAL HISTORY: [name] 79 y.o. female returns for Followup visit today. Her last visit with me was back in February 2013. S She is without any bleeding problems she has no cold sore flus. She denies any nausea or vomiting. Patient does complain of having tenderness in the right breast at the incision site there is noted to be some fullness in this area which is on palpation tender to touch. It feels more like a scar tissue. She does have upcoming mammograms. She was seen by Dr. Claud Kelp who also recommended an ultrasound. Remainder of the 10 point review of systems is unremarkable.  MEDICAL HISTORY: Past Medical History  Diagnosis Date  . Family history of colonic polyps   . Other abnormal glucose   . Pain in joint, shoulder region   . Unspecified hearing loss   . Aortic stenosis     s/p tissue AVR 05/2011  . Alcohol abuse, unspecified   . Dyskinesia of esophagus   . Diaphragmatic hernia without mention of obstruction or gangrene   . Tobacco use disorder   . Internal hemorrhoids without mention of complication   . Diverticulosis of colon (without mention of hemorrhage)   . Postmenopausal bleeding   . HTN (hypertension)   . HLD (hyperlipidemia)   . Malignant neoplasm of corpus  uteri, except isthmus   . Breast CA     rt  . Arthritis     ALLERGIES:  is allergic to calcium; ezetimibe; ramipril; simvastatin; and valsartan.  MEDICATIONS:  Current Outpatient Prescriptions  Medication Sig Dispense Refill  . aspirin 325 MG tablet Take 325 mg by mouth. Take 1/2 tablet daily      . carvedilol (COREG) 25 MG tablet Take 1 tablet (25 mg total) by mouth 2 (two) times daily with a meal.  180 tablet  3  . Cholecalciferol (VITAMIN D) 1000 UNITS capsule Take 1,000 Units by mouth 2 (two) times daily.       . diphenhydrAMINE (BENADRYL) 50 MG tablet Take 50 mg by mouth at bedtime as needed.      . hydrochlorothiazide (HYDRODIURIL) 25 MG tablet TAKE 1 TABLET DAILY  90 tablet  2  . Omega-3 Fatty Acids (FISH OIL) 1000 MG CAPS Take 2,000 mg by mouth daily.       . potassium chloride SA (K-DUR,KLOR-CON) 20 MEQ tablet Take 1 tablet (20 mEq total) by mouth 2 (two) times daily.  180 tablet  3  . Psyllium (METAMUCIL PO) Take by mouth.        SURGICAL HISTORY:  Past Surgical History  Procedure Date  . Rotator cuff repair 05/2008    right  . Laparoscopy w/ total bilateral pelvic and peri-aortic lymphadenectomy 11/04/2007  . Total abdominal hysterectomy w/ bilateral salpingoophorectomy 11/04/07    salpingo-oophrectomy  . Cataract extraction  bil  . 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 Breast lumpectomy 11/2009    DCIS, neg sentinel lymph node biopsy  . Abdominal hysterectomy   . Aortic valve replacement 06/13/2011    Procedure: AORTIC VALVE REPLACEMENT (AVR);  Surgeon: [name], MD;  Location: [facility] OR;  Service: Open Heart Surgery;  Laterality: N/A;  . Breast lumpectomy   . Shoulder surgery   . Abdominal hysterectomy   . Paraaortic lymphadenectomy     REVIEW OF SYSTEMS:  Pertinent items are noted in HPI.   PHYSICAL EXAMINATION: General appearance: alert, cooperative and appears stated age Lymph nodes: Cervical, supraclavicular, and axillary nodes normal. Resp: clear to  auscultation bilaterally Back: symmetric, no curvature. ROM normal. No CVA tenderness. Cardio: regular rate and rhythm GI: soft, non-tender; bowel sounds normal; no masses,  no organomegaly Extremities: extremities normal, atraumatic, no cyanosis or edema Neurologic: Grossly normal Right breast exam: Healed surgical scar there is fullness in this region but tenderness on palpation. No other masses no nipple discharge retraction or inversion. Left breast no masses or nipple discharge.  ECOG PERFORMANCE STATUS: 1 - Symptomatic but completely ambulatory  Last menstrual period 05/29/2007.  LABORATORY DATA: Lab Results  Component Value Date   WBC 5.4 06/19/2012   HGB 15.2 06/19/2012   HCT 43.3 06/19/2012   MCV 87.3 06/19/2012   PLT 126* 06/19/2012      Chemistry      Component Value Date/Time   NA 138 03/18/2012 0900   K 3.8 03/18/2012 0900   CL 102 03/18/2012 0900   CO2 30 03/18/2012 0900   BUN 14 03/18/2012 0900   CREATININE 0.8 03/18/2012 0900      Component Value Date/Time   CALCIUM 9.2 03/18/2012 0900   ALKPHOS 44 03/18/2012 0900   AST 21 03/18/2012 0900   ALT 25 03/18/2012 0900   BILITOT 0.7 03/18/2012 0900       RADIOGRAPHIC STUDIES:  No results found.  ASSESSMENT: 79 year old female with  #1 ductal carcinoma in situ of the right breast measuring 0.6 cm diagnosed May 2011. She had a lumpectomy on 12/02/2009 that showed a grade 2 DCIS she underwent MammoSite treatment. She declined tamoxifen or an aromatase inhibitor. She is on observation only.  #2 thrombocytopenia: Patient prior had had normal counts but starting in April of this year she developed mild thrombocytopenia. Her cardiac surgery was performed in January for the aortic valve replacement. I do suspect that her thrombocytopenia could be related to the fact or she could possibly have early ITP. At this time she is asymptomatic and observation and serial platelet count may be appropriate.   PLAN:   #1  patient will continue to be seen every 12 months. She will proceed with getting her mammograms performed in August. We will continue to monitor her her platelets.  #2 she will continue to be followed by Dr. Vinnie Langton as well as Dr. Derrell Lolling.   All questions were answered. The patient knows to call the clinic with any problems, questions or concerns. We can certainly see the patient much sooner if necessary.  I spent 15 minutes counseling the patient face to face. The total time spent in the appointment was 30 minutes.    Drue Second, MD Medical/Oncology [facility] [phone] (beeper) [phone] (Office)  06/19/2012, 12:23 PM

## 2012-06-19 NOTE — Patient Instructions (Addendum)
I will see you back in 1 year 

## 2012-07-03 ENCOUNTER — Encounter: Payer: Self-pay | Admitting: Cardiovascular Disease

## 2012-09-16 ENCOUNTER — Other Ambulatory Visit: Payer: Self-pay

## 2012-09-16 MED ORDER — VITAMIN D 1000 UNITS PO CAPS: 1000.0000 [IU] | ORAL_CAPSULE | Freq: Two times a day (BID) | ORAL | Status: DC

## 2012-09-16 NOTE — Telephone Encounter (Signed)
[facility] D 1000 units taking on cap bid. Pt has always bought OTC but this year her insurance will pay 100 % if sent to mail order pharmacy.Please advise.

## 2012-09-16 NOTE — Telephone Encounter (Signed)
[facility]

## 2012-09-17 NOTE — Telephone Encounter (Signed)
Left voicemail letting pt know [facility]

## 2012-10-01 ENCOUNTER — Encounter: Payer: Self-pay | Admitting: Family Medicine

## 2012-10-01 ENCOUNTER — Ambulatory Visit (INDEPENDENT_AMBULATORY_CARE_PROVIDER_SITE_OTHER): Payer: Medicare Other | Admitting: Family Medicine

## 2012-10-01 VITALS — BP 157/83 | HR 65 | Resp 16 | Ht 66.0 in | Wt 202.0 lb

## 2012-10-01 DIAGNOSIS — C549 Malignant neoplasm of corpus uteri, unspecified: Secondary | ICD-10-CM

## 2012-10-01 NOTE — Progress Notes (Signed)
  Subjective:     [name] is a 79 y.o. female and is here for a comprehensive physical exam. The patient reports problems - allergies are acting up, but improving.  Saw Dr. Stanford Breed 6 months ago, who did not do further pap smear testing, per new guidelines..  History   Social History  . Marital Status: Married    Spouse Name: N/A    Number of Children: N/A  . Years of Education: N/A   Occupational History  . Not on file.   Social History Main Topics  . Smoking status: Former Smoker -- 1.00 packs/day    Quit date: 03/28/2000  . Smokeless tobacco: Not on file  . Alcohol Use: No  . Drug Use: No  . Sexually Active: No   Other Topics Concern  . Not on file   Social History Narrative  . No narrative on file   Health Maintenance  Topic Date Due  . Pap Smear  03/21/2012  . Mammogram  01/23/2013  . Influenza Vaccine  01/26/2013  . Colonoscopy  08/07/2018  . Tetanus/tdap  03/09/2019  . Pneumococcal Polysaccharide Vaccine Age 45 And Over  Completed  . Zostavax  Completed    The following portions of the patient's history were reviewed and updated as appropriate: allergies, current medications, past family history, past medical history, past social history, past surgical history and problem list.  Review of Systems Pertinent items are noted in HPI.   Objective:    BP 157/83  Pulse 65  Resp 16  Ht 5\' 6"  (1.676 m)  Wt 202 lb (91.627 kg)  BMI 32.62 kg/m2  LMP 05/29/2007 General appearance: alert, cooperative and appears stated age Head: Normocephalic, without obvious abnormality, atraumatic Neck: no adenopathy, supple, symmetrical, trachea midline and thyroid not enlarged, symmetric, no tenderness/mass/nodules Lungs: clear to auscultation bilaterally Breasts: thickening noted under previoius lumpectomy, unchanged from previous per pt.  had mammogram and u/s for same in 12/2011, for yearly follow up. Heart: regular rate and rhythm and systolic murmur: holosystolic  2/6, blowing throughout the precordium Abdomen: soft, non-tender; bowel sounds normal; no masses,  no organomegaly Pelvic: external genitalia normal, uterus surgically absent and atrophic vagina, cuff without lesion mass noted. Extremities: 2+ edema Pulses: 2+ and symmetric Skin: Skin color, texture, turgor normal. No rashes or lesions Lymph nodes: Cervical, supraclavicular, and axillary nodes normal. Neurologic: Grossly normal    Assessment:   H/o endometrial Ca, H/o breast Ca, s/p aortic valve replacement.  No signs of recurrence of endometrial Ca.    Plan:    Continue yearly exams with me.  Yearly exam with GYN/Onc in 6 months. See After Visit Summary for Counseling Recommendations

## 2012-10-01 NOTE — Patient Instructions (Signed)
Preventive Care for Adults, Female A healthy lifestyle and preventive care can promote health and wellness. Preventive health guidelines for women include the following key practices.  A routine yearly physical is a good way to check with your caregiver about your health and preventive screening. It is a chance to share any concerns and updates on your health, and to receive a thorough exam.  Visit your dentist for a routine exam and preventive care every 6 months. Brush your teeth twice a day and floss once a day. Good oral hygiene prevents tooth decay and gum disease.  The frequency of eye exams is based on your age, health, family medical history, use of contact lenses, and other factors. Follow your caregiver's recommendations for frequency of eye exams.  Eat a healthy diet. Foods like vegetables, fruits, whole grains, low-fat dairy products, and lean protein foods contain the nutrients you need without too many calories. Decrease your intake of foods high in solid fats, added sugars, and salt. Eat the right amount of calories for you.Get information about a proper diet from your caregiver, if necessary.  Regular physical exercise is one of the most important things you can do for your health. Most adults should get at least 150 minutes of moderate-intensity exercise (any activity that increases your heart rate and causes you to sweat) each week. In addition, most adults need muscle-strengthening exercises on 2 or more days a week.  Maintain a healthy weight. The body mass index (BMI) is a screening tool to identify possible weight problems. It provides an estimate of body fat based on height and weight. Your caregiver can help determine your BMI, and can help you achieve or maintain a healthy weight.For adults 20 years and older:  A BMI below 18.5 is considered underweight.  A BMI of 18.5 to 24.9 is normal.  A BMI of 25 to 29.9 is considered overweight.  A BMI of 30 and above is  considered obese.  Maintain normal blood lipids and cholesterol levels by exercising and minimizing your intake of saturated fat. Eat a balanced diet with plenty of fruit and vegetables. Blood tests for lipids and cholesterol should begin at age 20 and be repeated every 5 years. If your lipid or cholesterol levels are high, you are over 50, or you are at high risk for heart disease, you may need your cholesterol levels checked more frequently.Ongoing high lipid and cholesterol levels should be treated with medicines if diet and exercise are not effective.  If you smoke, find out from your caregiver how to quit. If you do not use tobacco, do not start.  If you are pregnant, do not drink alcohol. If you are breastfeeding, be very cautious about drinking alcohol. If you are not pregnant and choose to drink alcohol, do not exceed 1 drink per day. One drink is considered to be 12 ounces (355 mL) of beer, 5 ounces (148 mL) of wine, or 1.5 ounces (44 mL) of liquor.  Avoid use of street drugs. Do not share needles with anyone. Ask for help if you need support or instructions about stopping the use of drugs.  High blood pressure causes heart disease and increases the risk of stroke. Your blood pressure should be checked at least every 1 to 2 years. Ongoing high blood pressure should be treated with medicines if weight loss and exercise are not effective.  If you are 55 to 77 years old, ask your caregiver if you should take aspirin to prevent strokes.  Diabetes   screening involves taking a blood sample to check your fasting blood sugar level. This should be done once every 3 years, after age 45, if you are within normal weight and without risk factors for diabetes. Testing should be considered at a younger age or be carried out more frequently if you are overweight and have at least 1 risk factor for diabetes.  Breast cancer screening is essential preventive care for women. You should practice "breast  self-awareness." This means understanding the normal appearance and feel of your breasts and may include breast self-examination. Any changes detected, no matter how small, should be reported to a caregiver. Women in their 20s and 30s should have a clinical breast exam (CBE) by a caregiver as part of a regular health exam every 1 to 3 years. After age 40, women should have a CBE every year. Starting at age 40, women should consider having a mammography (breast X-ray test) every year. Women who have a family history of breast cancer should talk to their caregiver about genetic screening. Women at a high risk of breast cancer should talk to their caregivers about having magnetic resonance imaging (MRI) and a mammography every year.  The Pap test is a screening test for cervical cancer. A Pap test can show cell changes on the cervix that might become cervical cancer if left untreated. A Pap test is a procedure in which cells are obtained and examined from the lower end of the uterus (cervix).  Women should have a Pap test starting at age 21.  Between ages 21 and 29, Pap tests should be repeated every 2 years.  Beginning at age 30, you should have a Pap test every 3 years as long as the past 3 Pap tests have been normal.  Some women have medical problems that increase the chance of getting cervical cancer. Talk to your caregiver about these problems. It is especially important to talk to your caregiver if a new problem develops soon after your last Pap test. In these cases, your caregiver may recommend more frequent screening and Pap tests.  The above recommendations are the same for women who have or have not gotten the vaccine for human papillomavirus (HPV).  If you had a hysterectomy for a problem that was not cancer or a condition that could lead to cancer, then you no longer need Pap tests. Even if you no longer need a Pap test, a regular exam is a good idea to make sure no other problems are  starting.  If you are between ages 65 and 70, and you have had normal Pap tests going back 10 years, you no longer need Pap tests. Even if you no longer need a Pap test, a regular exam is a good idea to make sure no other problems are starting.  If you have had past treatment for cervical cancer or a condition that could lead to cancer, you need Pap tests and screening for cancer for at least 20 years after your treatment.  If Pap tests have been discontinued, risk factors (such as a new sexual partner) need to be reassessed to determine if screening should be resumed.  The HPV test is an additional test that may be used for cervical cancer screening. The HPV test looks for the virus that can cause the cell changes on the cervix. The cells collected during the Pap test can be tested for HPV. The HPV test could be used to screen women aged 30 years and older, and should   be used in women of any age who have unclear Pap test results. After the age of 30, women should have HPV testing at the same frequency as a Pap test.  Colorectal cancer can be detected and often prevented. Most routine colorectal cancer screening begins at the age of 50 and continues through age 75. However, your caregiver may recommend screening at an earlier age if you have risk factors for colon cancer. On a yearly basis, your caregiver may provide home test kits to check for hidden blood in the stool. Use of a small camera at the end of a tube, to directly examine the colon (sigmoidoscopy or colonoscopy), can detect the earliest forms of colorectal cancer. Talk to your caregiver about this at age 50, when routine screening begins. Direct examination of the colon should be repeated every 5 to 10 years through age 75, unless early forms of pre-cancerous polyps or small growths are found.  Hepatitis C blood testing is recommended for all people born from 1945 through 1965 and any individual with known risks for hepatitis C.  Practice  safe sex. Use condoms and avoid high-risk sexual practices to reduce the spread of sexually transmitted infections (STIs). STIs include gonorrhea, chlamydia, syphilis, trichomonas, herpes, HPV, and human immunodeficiency virus (HIV). Herpes, HIV, and HPV are viral illnesses that have no cure. They can result in disability, cancer, and death. Sexually active women aged 25 and younger should be checked for chlamydia. Older women with new or multiple partners should also be tested for chlamydia. Testing for other STIs is recommended if you are sexually active and at increased risk.  Osteoporosis is a disease in which the bones lose minerals and strength with aging. This can result in serious bone fractures. The risk of osteoporosis can be identified using a bone density scan. Women ages 65 and over and women at risk for fractures or osteoporosis should discuss screening with their caregivers. Ask your caregiver whether you should take a calcium supplement or vitamin D to reduce the rate of osteoporosis.  Menopause can be associated with physical symptoms and risks. Hormone replacement therapy is available to decrease symptoms and risks. You should talk to your caregiver about whether hormone replacement therapy is right for you.  Use sunscreen with sun protection factor (SPF) of 30 or more. Apply sunscreen liberally and repeatedly throughout the day. You should seek shade when your shadow is shorter than you. Protect yourself by wearing long sleeves, pants, a wide-brimmed hat, and sunglasses year round, whenever you are outdoors.  Once a month, do a whole body skin exam, using a mirror to look at the skin on your back. Notify your caregiver of new moles, moles that have irregular borders, moles that are larger than a pencil eraser, or moles that have changed in shape or color.  Stay current with required immunizations.  Influenza. You need a dose every fall (or winter). The composition of the flu vaccine  changes each year, so being vaccinated once is not enough.  Pneumococcal polysaccharide. You need 1 to 2 doses if you smoke cigarettes or if you have certain chronic medical conditions. You need 1 dose at age 65 (or older) if you have never been vaccinated.  Tetanus, diphtheria, pertussis (Tdap, Td). Get 1 dose of Tdap vaccine if you are younger than age 65, are over 65 and have contact with an infant, are a healthcare worker, are pregnant, or simply want to be protected from whooping cough. After that, you need a Td   booster dose every 10 years. Consult your caregiver if you have not had at least 3 tetanus and diphtheria-containing shots sometime in your life or have a deep or dirty wound.  HPV. You need this vaccine if you are a woman age 26 or younger. The vaccine is given in 3 doses over 6 months.  Measles, mumps, rubella (MMR). You need at least 1 dose of MMR if you were born in 1957 or later. You may also need a second dose.  Meningococcal. If you are age 19 to 21 and a first-year college student living in a residence hall, or have one of several medical conditions, you need to get vaccinated against meningococcal disease. You may also need additional booster doses.  Zoster (shingles). If you are age 60 or older, you should get this vaccine.  Varicella (chickenpox). If you have never had chickenpox or you were vaccinated but received only 1 dose, talk to your caregiver to find out if you need this vaccine.  Hepatitis A. You need this vaccine if you have a specific risk factor for hepatitis A virus infection or you simply wish to be protected from this disease. The vaccine is usually given as 2 doses, 6 to 18 months apart.  Hepatitis B. You need this vaccine if you have a specific risk factor for hepatitis B virus infection or you simply wish to be protected from this disease. The vaccine is given in 3 doses, usually over 6 months. Preventive Services / Frequency Ages 19 to 39  Blood  pressure check.** / Every 1 to 2 years.  Lipid and cholesterol check.** / Every 5 years beginning at age 20.  Clinical breast exam.** / Every 3 years for women in their 20s and 30s.  Pap test.** / Every 2 years from ages 21 through 29. Every 3 years starting at age 30 through age 65 or 70 with a history of 3 consecutive normal Pap tests.  HPV screening.** / Every 3 years from ages 30 through ages 65 to 70 with a history of 3 consecutive normal Pap tests.  Hepatitis C blood test.** / For any individual with known risks for hepatitis C.  Skin self-exam. / Monthly.  Influenza immunization.** / Every year.  Pneumococcal polysaccharide immunization.** / 1 to 2 doses if you smoke cigarettes or if you have certain chronic medical conditions.  Tetanus, diphtheria, pertussis (Tdap, Td) immunization. / A one-time dose of Tdap vaccine. After that, you need a Td booster dose every 10 years.  HPV immunization. / 3 doses over 6 months, if you are 26 and younger.  Measles, mumps, rubella (MMR) immunization. / You need at least 1 dose of MMR if you were born in 1957 or later. You may also need a second dose.  Meningococcal immunization. / 1 dose if you are age 19 to 21 and a first-year college student living in a residence hall, or have one of several medical conditions, you need to get vaccinated against meningococcal disease. You may also need additional booster doses.  Varicella immunization.** / Consult your caregiver.  Hepatitis A immunization.** / Consult your caregiver. 2 doses, 6 to 18 months apart.  Hepatitis B immunization.** / Consult your caregiver. 3 doses usually over 6 months. Ages 40 to 64  Blood pressure check.** / Every 1 to 2 years.  Lipid and cholesterol check.** / Every 5 years beginning at age 20.  Clinical breast exam.** / Every year after age 40.  Mammogram.** / Every year beginning at age 40   and continuing for as long as you are in good health. Consult with your  caregiver.  Pap test.** / Every 3 years starting at age 30 through age 65 or 70 with a history of 3 consecutive normal Pap tests.  HPV screening.** / Every 3 years from ages 30 through ages 65 to 70 with a history of 3 consecutive normal Pap tests.  Fecal occult blood test (FOBT) of stool. / Every year beginning at age 50 and continuing until age 75. You may not need to do this test if you get a colonoscopy every 10 years.  Flexible sigmoidoscopy or colonoscopy.** / Every 5 years for a flexible sigmoidoscopy or every 10 years for a colonoscopy beginning at age 50 and continuing until age 75.  Hepatitis C blood test.** / For all people born from 1945 through 1965 and any individual with known risks for hepatitis C.  Skin self-exam. / Monthly.  Influenza immunization.** / Every year.  Pneumococcal polysaccharide immunization.** / 1 to 2 doses if you smoke cigarettes or if you have certain chronic medical conditions.  Tetanus, diphtheria, pertussis (Tdap, Td) immunization.** / A one-time dose of Tdap vaccine. After that, you need a Td booster dose every 10 years.  Measles, mumps, rubella (MMR) immunization. / You need at least 1 dose of MMR if you were born in 1957 or later. You may also need a second dose.  Varicella immunization.** / Consult your caregiver.  Meningococcal immunization.** / Consult your caregiver.  Hepatitis A immunization.** / Consult your caregiver. 2 doses, 6 to 18 months apart.  Hepatitis B immunization.** / Consult your caregiver. 3 doses, usually over 6 months. Ages 65 and over  Blood pressure check.** / Every 1 to 2 years.  Lipid and cholesterol check.** / Every 5 years beginning at age 20.  Clinical breast exam.** / Every year after age 40.  Mammogram.** / Every year beginning at age 40 and continuing for as long as you are in good health. Consult with your caregiver.  Pap test.** / Every 3 years starting at age 30 through age 65 or 70 with a 3  consecutive normal Pap tests. Testing can be stopped between 65 and 70 with 3 consecutive normal Pap tests and no abnormal Pap or HPV tests in the past 10 years.  HPV screening.** / Every 3 years from ages 30 through ages 65 or 70 with a history of 3 consecutive normal Pap tests. Testing can be stopped between 65 and 70 with 3 consecutive normal Pap tests and no abnormal Pap or HPV tests in the past 10 years.  Fecal occult blood test (FOBT) of stool. / Every year beginning at age 50 and continuing until age 75. You may not need to do this test if you get a colonoscopy every 10 years.  Flexible sigmoidoscopy or colonoscopy.** / Every 5 years for a flexible sigmoidoscopy or every 10 years for a colonoscopy beginning at age 50 and continuing until age 75.  Hepatitis C blood test.** / For all people born from 1945 through 1965 and any individual with known risks for hepatitis C.  Osteoporosis screening.** / A one-time screening for women ages 65 and over and women at risk for fractures or osteoporosis.  Skin self-exam. / Monthly.  Influenza immunization.** / Every year.  Pneumococcal polysaccharide immunization.** / 1 dose at age 65 (or older) if you have never been vaccinated.  Tetanus, diphtheria, pertussis (Tdap, Td) immunization. / A one-time dose of Tdap vaccine if you are over   65 and have contact with an infant, are a healthcare worker, or simply want to be protected from whooping cough. After that, you need a Td booster dose every 10 years.  Varicella immunization.** / Consult your caregiver.  Meningococcal immunization.** / Consult your caregiver.  Hepatitis A immunization.** / Consult your caregiver. 2 doses, 6 to 18 months apart.  Hepatitis B immunization.** / Check with your caregiver. 3 doses, usually over 6 months. ** Family history and personal history of risk and conditions may change your caregiver's recommendations. Document Released: 07/10/2001 Document Revised: 08/06/2011  Document Reviewed: 10/09/2010 [employer] Patient Information 2013 [employer].  

## 2012-10-02 NOTE — Assessment & Plan Note (Signed)
No evidence of recurrence.  F/u with GYN/ONC in 6 months.

## 2012-11-01 ENCOUNTER — Other Ambulatory Visit: Payer: Self-pay | Admitting: Cardiovascular Disease

## 2012-12-17 ENCOUNTER — Other Ambulatory Visit: Payer: Self-pay | Admitting: Family Medicine

## 2012-12-17 DIAGNOSIS — Z853 Personal history of malignant neoplasm of breast: Secondary | ICD-10-CM

## 2012-12-17 DIAGNOSIS — Z9889 Other specified postprocedural states: Secondary | ICD-10-CM

## 2012-12-31 ENCOUNTER — Encounter: Payer: Self-pay | Admitting: Cardiovascular Disease

## 2012-12-31 ENCOUNTER — Ambulatory Visit (INDEPENDENT_AMBULATORY_CARE_PROVIDER_SITE_OTHER): Payer: Medicare Other | Admitting: Cardiovascular Disease

## 2012-12-31 VITALS — BP 172/86 | HR 67 | Ht 66.0 in | Wt 206.0 lb

## 2012-12-31 DIAGNOSIS — E785 Hyperlipidemia, unspecified: Secondary | ICD-10-CM

## 2012-12-31 DIAGNOSIS — R0602 Shortness of breath: Secondary | ICD-10-CM | POA: Diagnosis not present

## 2012-12-31 DIAGNOSIS — I359 Nonrheumatic aortic valve disorder, unspecified: Secondary | ICD-10-CM

## 2012-12-31 DIAGNOSIS — I10 Essential (primary) hypertension: Secondary | ICD-10-CM | POA: Diagnosis not present

## 2012-12-31 LAB — CBC WITH DIFFERENTIAL/PLATELET
Basophils Absolute: 0 10*3/uL (ref 0.0–0.1)
Basophils Relative: 0.5 % (ref 0.0–3.0)
Eosinophils Absolute: 0.2 10*3/uL (ref 0.0–0.7)
HCT: 43 % (ref 36.0–46.0)
Hemoglobin: 14.7 g/dL (ref 12.0–15.0)
Lymphs Abs: 1 10*3/uL (ref 0.7–4.0)
MCHC: 34.2 g/dL (ref 30.0–36.0)
Monocytes Relative: 9.7 % (ref 3.0–12.0)
Neutro Abs: 4.2 10*3/uL (ref 1.4–7.7)
RDW: 13.4 % (ref 11.5–14.6)

## 2012-12-31 LAB — BASIC METABOLIC PANEL
BUN: 14 mg/dL (ref 6–23)
CO2: 30 mEq/L (ref 19–32)
Calcium: 10 mg/dL (ref 8.4–10.5)
Creatinine, Ser: 0.8 mg/dL (ref 0.4–1.2)

## 2012-12-31 MED ORDER — POTASSIUM CHLORIDE CRYS ER 20 MEQ PO TBCR: 20.0000 meq | EXTENDED_RELEASE_TABLET | Freq: Two times a day (BID) | ORAL | Status: DC

## 2012-12-31 MED ORDER — CARVEDILOL 25 MG PO TABS: 25.0000 mg | ORAL_TABLET | Freq: Two times a day (BID) | ORAL | Status: DC

## 2012-12-31 MED ORDER — AMLODIPINE BESYLATE 5 MG PO TABS: 5.0000 mg | ORAL_TABLET | Freq: Every day | ORAL | Status: DC

## 2012-12-31 NOTE — Progress Notes (Signed)
HPI:  79 year old woman returning for followup evaluation. The patient has a history of symptomatic aortic stenosis and she underwent aortic valve replacement with a bioprosthetic valve in 2013. She's had normal left ventricular systolic function. She's also followed for hypertension and hyperlipidemia. She is intolerant to statin drugs and other lipid-lowering therapies. Last lipids from October 2013 showed a cholesterol of 212, triglycerides 84, HDL 45, and LDL 162.  The patient complains of shortness of breath over the last 4 months. She describes shortness of breath with low-level exertion. She has no symptoms at rest. She denies orthopnea, PND, or increased leg swelling. She does have some mild chronic edema which is unchanged. She denies chest pain or pressure, lightheadedness, or syncope. Home blood pressures have been running in the 130s and 140s. She's had no fevers, chills, or cough.  Outpatient Encounter Prescriptions as of 12/31/2012  Medication Sig Dispense Refill  . aspirin 325 MG tablet Take 325 mg by mouth. Take 1/2 tablet daily      . carvedilol (COREG) 25 MG tablet Take 1 tablet (25 mg total) by mouth 2 (two) times daily with a meal.  180 tablet  3  . Cholecalciferol (VITAMIN D) 1000 UNITS capsule Take 1 capsule (1,000 Units total) by mouth 2 (two) times daily.  180 capsule  3  . diphenhydrAMINE (BENADRYL) 50 MG tablet Take 50 mg by mouth at bedtime as needed.      . hydrochlorothiazide (HYDRODIURIL) 25 MG tablet TAKE 1 TABLET DAILY  90 tablet  1  . Omega-3 Fatty Acids (FISH OIL) 1000 MG CAPS Take 2,000 mg by mouth daily.       . potassium chloride SA (K-DUR,KLOR-CON) 20 MEQ tablet Take 1 tablet (20 mEq total) by mouth 2 (two) times daily.  180 tablet  3  . Psyllium (METAMUCIL PO) Take by mouth.       No facility-administered encounter medications on file as of 12/31/2012.    Allergies  Allergen Reactions  . Calcium     REACTION: constipation  . Ezetimibe     REACTION:  fatigue  . Ramipril     REACTION: abdominal pain  . Simvastatin     REACTION: leg pain and body aches  . Valsartan     REACTION: abdominal pain    Past Medical History  Diagnosis Date  . Family history of colonic polyps   . Other abnormal glucose   . Pain in joint, shoulder region   . Unspecified hearing loss   . Aortic stenosis     s/p tissue AVR 05/2011  . Alcohol abuse, unspecified   . Dyskinesia of esophagus   . Diaphragmatic hernia without mention of obstruction or gangrene   . Tobacco use disorder   . Internal hemorrhoids without mention of complication   . Diverticulosis of colon (without mention of hemorrhage)   . Postmenopausal bleeding   . HTN (hypertension)   . HLD (hyperlipidemia)   . Malignant neoplasm of corpus uteri, except isthmus   . Breast CA     rt  . Arthritis     ROS: Negative except as per HPI  BP 172/86  Pulse 67  Ht 5\' 6"  (1.676 m)  Wt 93.441 kg (206 lb)  BMI 33.27 kg/m2  LMP 05/29/2007  PHYSICAL EXAM: Pt is alert and oriented, NAD HEENT: normal Neck: JVP - normal, carotids 2+= with soft bilateral bruits Lungs: CTA bilaterally CV: RRR with grade 2/6 systolic ejection murmur best heard at the right upper sternal border  Abd: soft, NT, Positive BS, no hepatomegaly Ext: no C/C/E, distal pulses intact and equal Skin: warm/dry no rash  EKG:  Sinus rhythm 67 beats per minute, left ventricular hypertrophy, left anterior fascicular block.  ASSESSMENT AND PLAN: 1. Shortness of breath. Uncertain etiology. Differential diagnosis includes diastolic dysfunction, hypertensive related, or valvular related. I do not appreciate any abnormalities in her lung exam. Will repeat an echocardiogram and check a chest x-ray. Labs in January showed no evidence of anemia. Will repeat lab work since her symptoms have started in the interval from her previous lab work.  2. Aortic stenosis status post bioprosthetic aVR. Exam is unremarkable with an expected systolic  ejection murmur. Will repeat echo in the setting of shortness of breath.  3. Hypertension with suboptimal control. Add amlodipine 5 mg. Continue hydrochlorothiazide and carvedilol. Multiple drug intolerances noted.  4. Hyperlipidemia. Intolerant to lipid-lowering therapies. Continue to work on diet and exercise  For followup I will see her back in 6 months.  [name] 12/31/2012 9:15 AM

## 2012-12-31 NOTE — Patient Instructions (Signed)
A chest x-ray takes a picture of the organs and structures inside the chest, including the heart, lungs, and blood vessels. This test can show several things, including, whether the heart is enlarges; whether fluid is building up in the lungs; and whether pacemaker / defibrillator leads are still in place.  Your physician has requested that you have an echocardiogram. Echocardiography is a painless test that uses sound waves to create images of your heart. It provides your doctor with information about the size and shape of your heart and how well your heart's chambers and valves are working. This procedure takes approximately one hour. There are no restrictions for this procedure.  Your physician recommends that you have lab work today: BMP, CBC and BNP   Your physician has recommended you make the following change in your medication: START Amlodipine 5mg  take one by mouth daily  Your physician wants you to follow-up in: 6 MONTHS with Dr Excell Seltzer.  You will receive a reminder letter in the mail two months in advance. If you don't receive a letter, please call our office to schedule the follow-up appointment.

## 2013-01-02 ENCOUNTER — Ambulatory Visit (INDEPENDENT_AMBULATORY_CARE_PROVIDER_SITE_OTHER)
Admission: RE | Admit: 2013-01-02 | Discharge: 2013-01-02 | Disposition: A | Discharge status: Home / Self Care | Payer: Medicare Other | Source: Ambulatory Visit | Attending: Cardiovascular Disease | Admitting: Cardiovascular Disease

## 2013-01-02 ENCOUNTER — Ambulatory Visit (HOSPITAL_COMMUNITY): Payer: Medicare Other | Attending: Cardiovascular Disease

## 2013-01-02 DIAGNOSIS — I359 Nonrheumatic aortic valve disorder, unspecified: Secondary | ICD-10-CM | POA: Diagnosis not present

## 2013-01-02 DIAGNOSIS — I10 Essential (primary) hypertension: Secondary | ICD-10-CM | POA: Diagnosis not present

## 2013-01-02 DIAGNOSIS — E785 Hyperlipidemia, unspecified: Secondary | ICD-10-CM | POA: Diagnosis not present

## 2013-01-02 DIAGNOSIS — R0609 Other forms of dyspnea: Secondary | ICD-10-CM | POA: Insufficient documentation

## 2013-01-02 DIAGNOSIS — Z952 Presence of prosthetic heart valve: Secondary | ICD-10-CM | POA: Insufficient documentation

## 2013-01-02 DIAGNOSIS — R609 Edema, unspecified: Secondary | ICD-10-CM | POA: Diagnosis not present

## 2013-01-02 DIAGNOSIS — R0602 Shortness of breath: Secondary | ICD-10-CM

## 2013-01-02 DIAGNOSIS — R0989 Other specified symptoms and signs involving the circulatory and respiratory systems: Secondary | ICD-10-CM | POA: Insufficient documentation

## 2013-01-02 NOTE — Progress Notes (Signed)
Echocardiogram performed.  

## 2013-01-08 ENCOUNTER — Encounter: Payer: Self-pay | Admitting: Cardiovascular Disease

## 2013-01-08 NOTE — Telephone Encounter (Signed)
This encounter was created in error - please disregard.

## 2013-01-08 NOTE — Telephone Encounter (Signed)
F/up  ° °Pt rtned call  °

## 2013-01-23 ENCOUNTER — Ambulatory Visit
Admission: RE | Admit: 2013-01-23 | Discharge: 2013-01-23 | Disposition: A | Discharge status: Home / Self Care | Payer: Medicare Other | Source: Ambulatory Visit | Attending: Family Medicine | Admitting: Family Medicine

## 2013-01-23 DIAGNOSIS — Z853 Personal history of malignant neoplasm of breast: Secondary | ICD-10-CM

## 2013-01-23 DIAGNOSIS — Z9889 Other specified postprocedural states: Secondary | ICD-10-CM

## 2013-01-23 DIAGNOSIS — R922 Inconclusive mammogram: Secondary | ICD-10-CM | POA: Diagnosis not present

## 2013-02-03 ENCOUNTER — Encounter: Payer: Self-pay | Admitting: *Deleted

## 2013-02-09 ENCOUNTER — Ambulatory Visit (INDEPENDENT_AMBULATORY_CARE_PROVIDER_SITE_OTHER): Payer: Medicare Other | Admitting: General Surgery

## 2013-02-09 ENCOUNTER — Encounter (INDEPENDENT_AMBULATORY_CARE_PROVIDER_SITE_OTHER): Payer: Self-pay | Admitting: General Surgery

## 2013-02-09 VITALS — BP 126/68 | HR 63 | Temp 97.6°F | Ht 66.0 in | Wt 207.0 lb

## 2013-02-09 DIAGNOSIS — C50911 Malignant neoplasm of unspecified site of right female breast: Secondary | ICD-10-CM

## 2013-02-09 DIAGNOSIS — C50919 Malignant neoplasm of unspecified site of unspecified female breast: Secondary | ICD-10-CM

## 2013-02-09 NOTE — Progress Notes (Signed)
Patient ID: [name], female   DOB: 13-Apr-1934, 79 y.o.   MRN: [id number] History: [name] is a 79 y.o. female. She returns for long-term followup regarding surveillance of her right breast cancer.  On December 02, 2009 she underwent right partial mastectomy, sentinel node biopsy, insertion of MammoSite. Final pathology report showed carcinoma in situ, Tis, N0. Radiation therapy was supervised by Dr. Roselind Messier. She is followed by Dr. Welton Flakes but is not on any antiestrogen therapy.  She notices that the thickening in her operative site is unchanged. Bilateral mammograms were performed on 02/03/2013  and were unremarkable. ..  Significant history is that she underwent aortic valve replacement by Dr. Laneta Simmers On Jan. 16 2013 and that went well and she is feeling better. She is followed by Dr. Vinnie Langton for primary care and Dr. [name] for cardiology. She still feels a lump in her right breast but it is unchanged. She has no chronic pain but there is some tenderness at the lumpectomy site. No arm swelling or numbness.  ROS: 10 system review of systems is negative except as described above  Exam: Patient alert. Mental status normal. No distress. Neck: No adenopathy or mass. No JVD. Lungs: Clear to auscultation bilaterally Heart: Regular rate and rhythm. Systolic murmur. Sternotomy well-healed Breasts: Right breast shows radially oriented scar superiorly with chronic thickening beneath the scar. Well-healed right axillary scar. No other mass. No other skin change. Left breast reveals no mass, no skin changes, no axillary adenopathy  Assessment  Ductal carcinoma in situ right breast, upper outer quadrants. No evidence of recurrence 2 years following right partial mastectomy sentinel lymph node biopsy and accelerated partial breast radiation.  S/p   AVR, doing well.   Hypertension    Plan: See Dr. Drue Second in January Repeat bilateral mammograms in September 2015 Return to see me in one  year   [name] M. Derrell Lolling, M.D., [facility] General and Minimally invasive Surgery Breast and Colorectal Surgery Office:   [phone] Pager:   [phone]

## 2013-02-09 NOTE — Patient Instructions (Signed)
Examination of your  breasts and the lymph node areas today is normal. Your recent mammograms are also normal showing no abnormality other than the scarring from the lumpectomy. There is no evidence for cancer.  Keep your annual appointment with Dr. Welton Flakes in January   repeat mammograms in one year, and see Dr. Derrell Lolling at that time.

## 2013-02-10 DIAGNOSIS — H52209 Unspecified astigmatism, unspecified eye: Secondary | ICD-10-CM | POA: Diagnosis not present

## 2013-02-10 DIAGNOSIS — Z961 Presence of intraocular lens: Secondary | ICD-10-CM | POA: Diagnosis not present

## 2013-03-06 ENCOUNTER — Ambulatory Visit (INDEPENDENT_AMBULATORY_CARE_PROVIDER_SITE_OTHER): Payer: Medicare Other

## 2013-03-06 DIAGNOSIS — Z23 Encounter for immunization: Secondary | ICD-10-CM | POA: Diagnosis not present

## 2013-04-11 IMAGING — CR DG CHEST 2V
2 series · 2 of 2 positions shown · non-contrast
Comparison: 11/29/2009

CLINICAL DATA: Aortic stenosis.  Aortic valve repair.  Preoperative
respiratory exam.  Hypertension.  Smoking history.

CHEST - 2 VIEW

[view not recorded (1 of 2)]
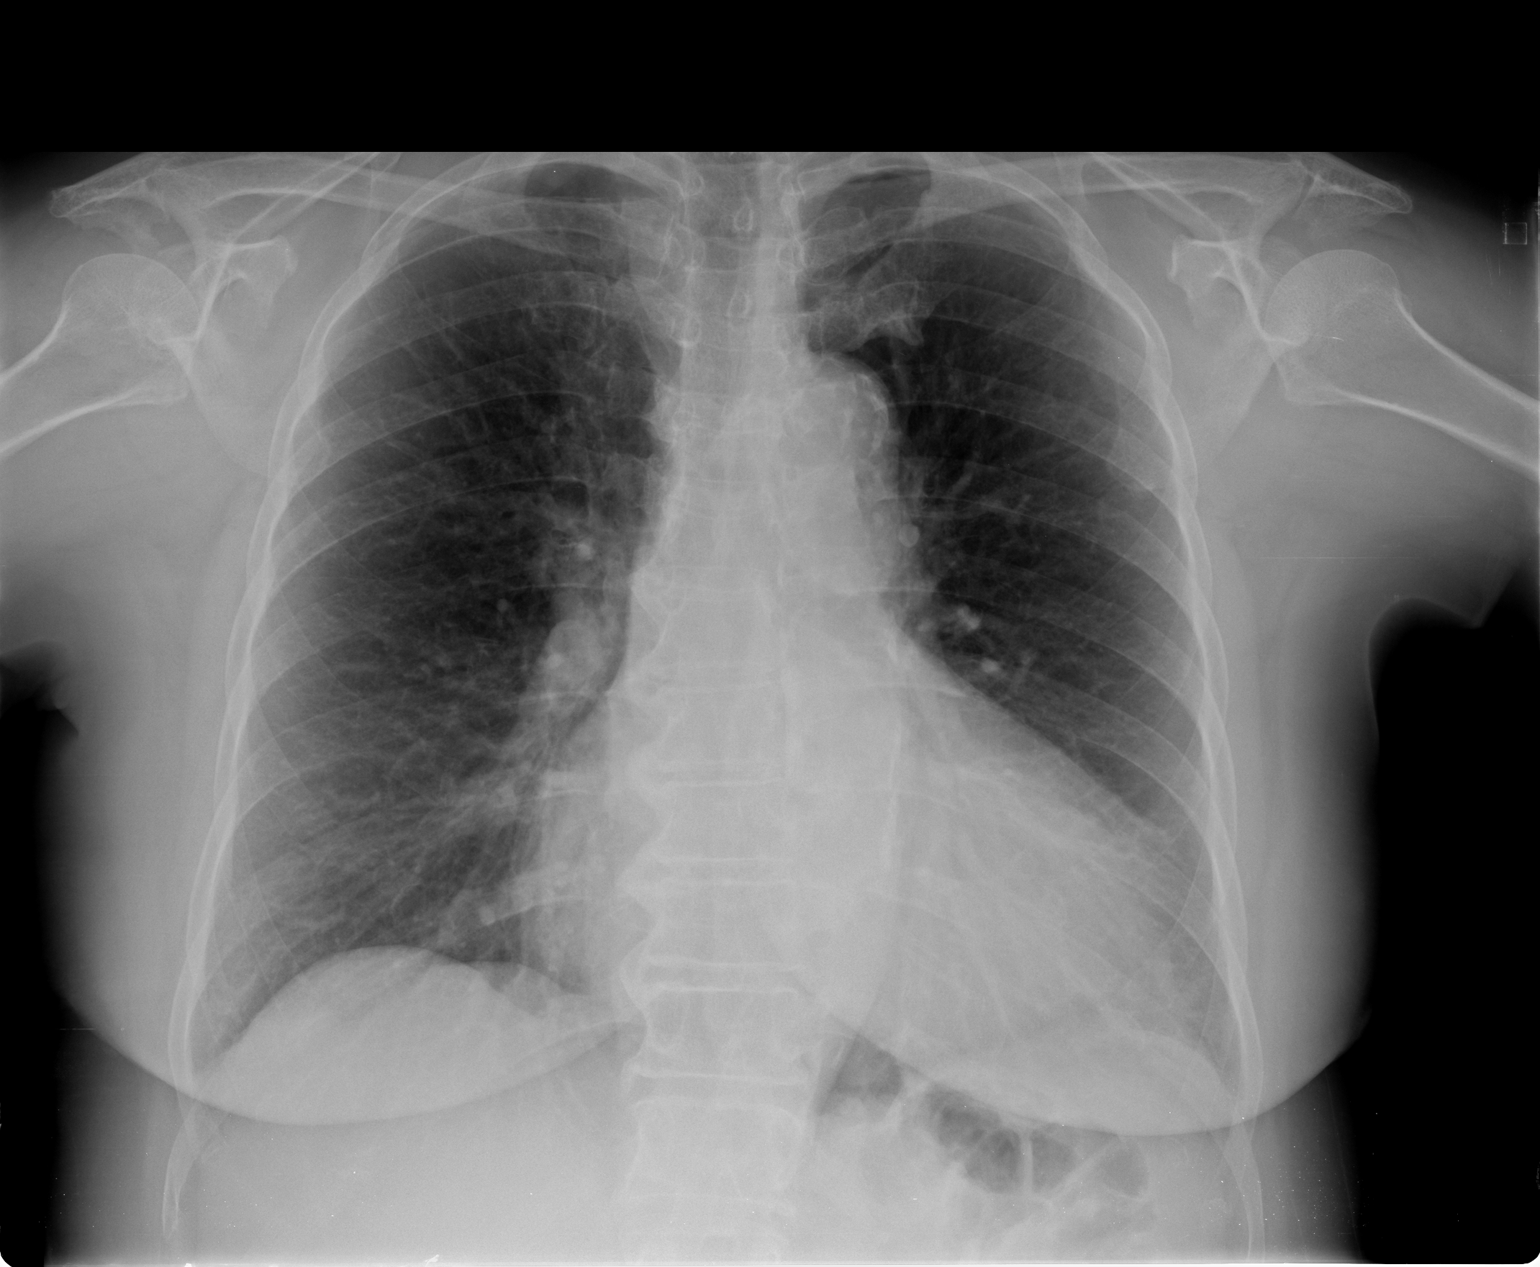

[view not recorded (2 of 2)]
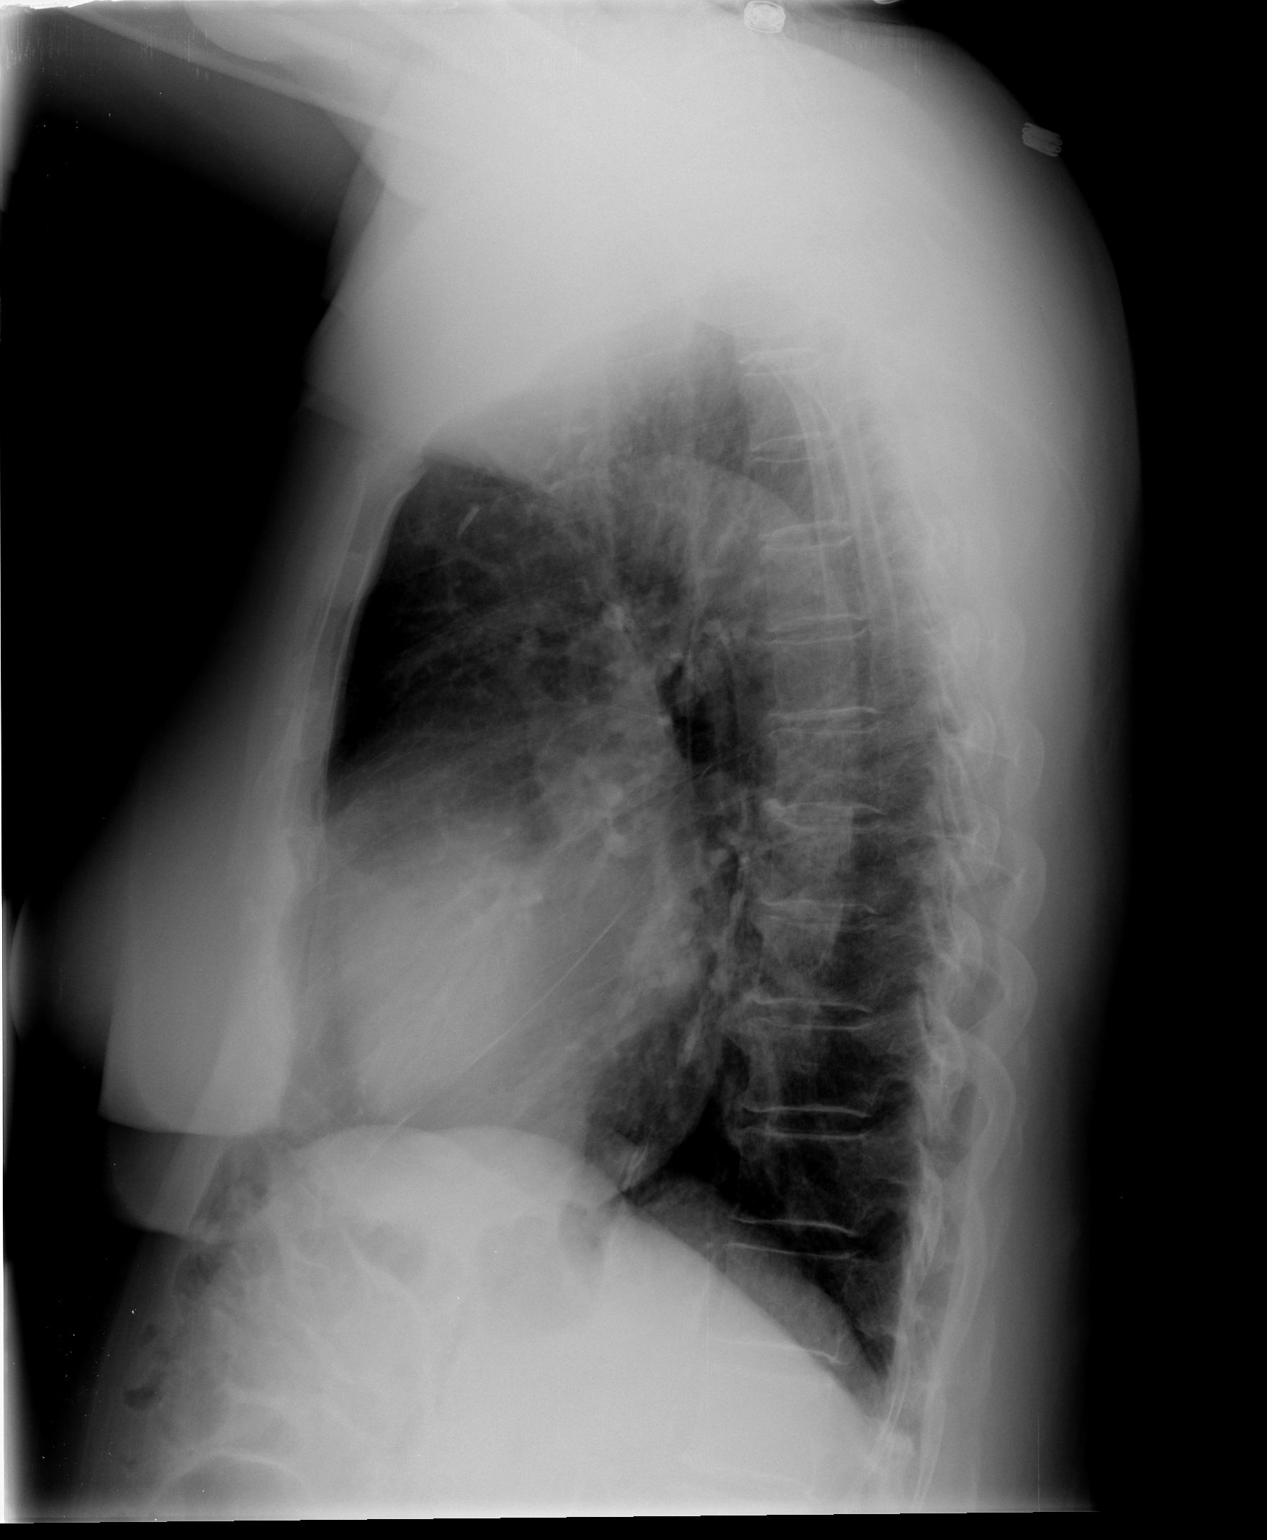

[2 of 2 positions shown; findings below may reference images not displayed]

FINDINGS: There is mild cardiomegaly.  There is atherosclerosis of
the aorta.  The vascularity is normal.  No effusions.  No focal
pulmonary pathology.  No significant bony finding.
IMPRESSION: Cardiomegaly.  Atherosclerosis of the aorta.  No active process
evident.

## 2013-04-13 IMAGING — CR DG CHEST 1V PORT
1 series · 1 of 1 positions shown · non-contrast
Comparison: 06/11/2011

CLINICAL DATA: Postop day 0 status post aortic valve replacement.

PORTABLE CHEST - 1 VIEW

[view not recorded]
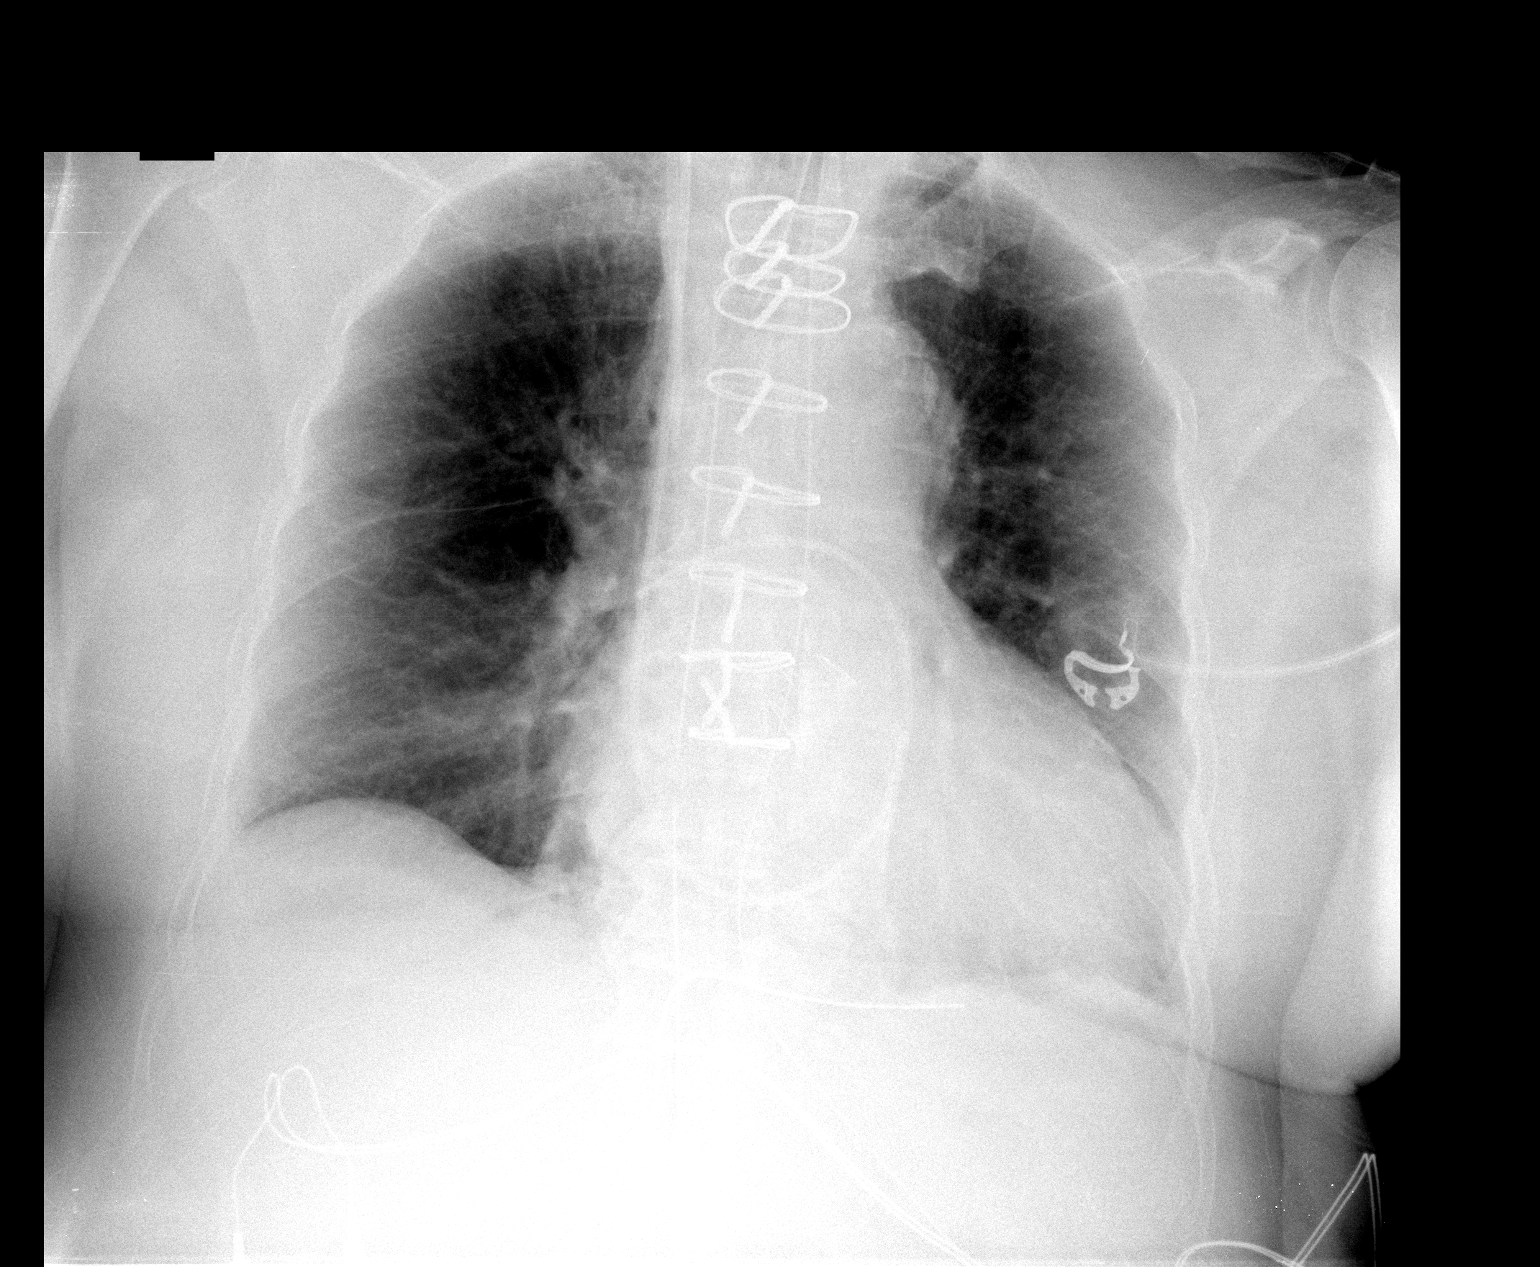

[1 of 1 positions shown; findings below may reference images not displayed]

FINDINGS: Right internal jugular Swan-Ganz catheter noted with tip
in the descending right pulmonary artery.

Endotracheal tube tip is 4 cm above the carina.

Mediastinal drain and left-sided chest tube noted.  Nasogastric
tube extends into the stomach.

Persistent mild cardiomegaly noted.

Aortic atherosclerosis is present.

No pneumothorax is observed.  There is minimal atelectasis or
scarring along the left hemidiaphragm.  No overt pulmonary edema or
discrete pleural effusion identified.

Prosthesis compatible with Eyo Perimount Magna-ease aortic
valve replacement noted.
IMPRESSION: 1.  Interval aortic valve prosthesis placement.  Tubes and lines
appear satisfactorily positioned.
2.  Stable cardiomegaly and stable aortic atherosclerosis.

## 2013-04-15 ENCOUNTER — Encounter (INDEPENDENT_AMBULATORY_CARE_PROVIDER_SITE_OTHER): Payer: Self-pay

## 2013-04-15 ENCOUNTER — Encounter: Payer: Self-pay | Admitting: Gynecology

## 2013-04-15 ENCOUNTER — Ambulatory Visit: Payer: Medicare Other | Attending: Gynecology | Admitting: Gynecology

## 2013-04-15 VITALS — BP 132/82 | HR 69 | Temp 97.9°F | Resp 16 | Ht 66.0 in | Wt 205.4 lb

## 2013-04-15 DIAGNOSIS — Z9079 Acquired absence of other genital organ(s): Secondary | ICD-10-CM | POA: Diagnosis not present

## 2013-04-15 DIAGNOSIS — C55 Malignant neoplasm of uterus, part unspecified: Secondary | ICD-10-CM

## 2013-04-15 DIAGNOSIS — C549 Malignant neoplasm of corpus uteri, unspecified: Secondary | ICD-10-CM | POA: Diagnosis not present

## 2013-04-15 DIAGNOSIS — Z90710 Acquired absence of both cervix and uterus: Secondary | ICD-10-CM | POA: Diagnosis not present

## 2013-04-15 NOTE — Patient Instructions (Signed)
Return to see Dr. Shawnie Pons on an annual basis.

## 2013-04-15 NOTE — Progress Notes (Signed)
Consult Note: Gyn-Onc   [name] 79 y.o. female  Chief Complaint  Patient presents with  . Uterine cancer    Follow up    Interval History: The patient returns today as regularly scheduled for followup. Since her last visit she has seen Dr. Shawnie Pons who felt that things are going well. Patient reports she has no GI GU or pelvic symptoms. She specifically denies any pelvic pain or vaginal bleeding or discharge. She is up-to-date with mammograms and colonoscopy.  HPI: Stage IB grade 2 endometrial cancer undergoing total abdominal hysterectomy bilateral salpingo-oophorectomy pelvic and para-aortic lymphadenectomy June 2009 no adjuvant therapy was required. Patient followed with no evidence of recurrent disease.  Review of Systems:10 point review of systems is negative as noted above.   Vitals: Blood pressure 132/82, pulse 69, temperature 97.9 F (36.6 C), temperature source Oral, resp. rate 16, height 5\' 6"  (1.676 m), weight 205 lb 6.4 oz (93.169 kg), last menstrual period 05/29/2007.  Physical Exam: General : The patient is a healthy woman in no acute distress.  HEENT: normocephalic, extraoccular movements normal; neck is supple without thyromegally  Lynphnodes: Supraclavicular and inguinal nodes not enlarged  Abdomen: Soft, non-tender, no ascites, no organomegally, no masses, no hernias  Pelvic:  EGBUS: Normal female  Vagina: Normal, no lesions  Urethra and Bladder: Normal, non-tender  Cervix: Surgically absent  Uterus: Surgically absent  Bi-manual examination: Non-tender; no adenxal masses or nodularity  Rectal: normal sphincter tone, no masses, no blood  Lower extremities: Bilateral 1+ edema of the lower extremities.. Normal range of motion    Assessment/Plan: Stage IB grade 2 endometrial adenocarcinoma 2009. Patient's clinically free of disease. Now with 5 years of normal followup we will release her from our practice. She's encouraged to see Dr. Shawnie Pons on a annual  basis. Allergies  Allergen Reactions  . Calcium     REACTION: constipation  . Diovan [Valsartan]     REACTION: abdominal pain  . Ezetimibe     REACTION: fatigue  . Ramipril     REACTION: abdominal pain  . Simvastatin     REACTION: leg pain and body aches    Past Medical History  Diagnosis Date  . Family history of colonic polyps   . Other abnormal glucose   . Pain in joint, shoulder region   . Unspecified hearing loss   . Aortic stenosis     s/p tissue AVR 05/2011  . Alcohol abuse, unspecified   . Dyskinesia of esophagus   . Diaphragmatic hernia without mention of obstruction or gangrene   . Tobacco use disorder   . Internal hemorrhoids without mention of complication   . Diverticulosis of colon (without mention of hemorrhage)   . Postmenopausal bleeding   . HTN (hypertension)   . HLD (hyperlipidemia)   . Malignant neoplasm of corpus uteri, except isthmus   . Breast CA     rt  . Arthritis   . Heart murmur     Past Surgical History  Procedure Laterality Date  . Rotator cuff repair  05/2008    right  . Laparoscopy w/ total bilateral pelvic and peri-aortic lymphadenectomy  11/04/2007  . Total abdominal hysterectomy w/ bilateral salpingoophorectomy  11/04/07    salpingo-oophrectomy  . Cataract extraction      bil  . Breast biopsy  5/11    DCIS  . Breast lumpectomy  11/2009    DCIS, neg sentinel lymph node biopsy  . Abdominal hysterectomy    . Aortic valve replacement  06/13/2011    Procedure: AORTIC VALVE REPLACEMENT (AVR);  Surgeon: [name], MD;  Location[facility];  Service: Open Heart Surgery;  Laterality: N/A;  . Breast lumpectomy    . Shoulder surgery    . Abdominal hysterectomy    . Paraaortic lymphadenectomy      Current Outpatient Prescriptions  Medication Sig Dispense Refill  . amLODipine (NORVASC) 5 MG tablet Take 1 tablet (5 mg total) by mouth daily.  90 tablet  3  . aspirin 325 MG tablet Take 325 mg by mouth. Take 1/2 tablet daily      . carvedilol  (COREG) 25 MG tablet Take 1 tablet (25 mg total) by mouth 2 (two) times daily with a meal.  180 tablet  3  . Cholecalciferol (VITAMIN D) 1000 UNITS capsule Take 1 capsule (1,000 Units total) by mouth 2 (two) times daily.  180 capsule  3  . diphenhydrAMINE (BENADRYL) 50 MG tablet Take 50 mg by mouth at bedtime as needed.      . hydrochlorothiazide (HYDRODIURIL) 25 MG tablet TAKE 1 TABLET DAILY  90 tablet  1  . Omega-3 Fatty Acids (FISH OIL) 1000 MG CAPS Take 2,000 mg by mouth daily.       . potassium chloride SA (K-DUR,KLOR-CON) 20 MEQ tablet Take 1 tablet (20 mEq total) by mouth 2 (two) times daily.  180 tablet  3  . Psyllium (METAMUCIL PO) Take by mouth.       No current facility-administered medications for this visit.    History   Social History  . Marital Status: Married    Spouse Name: N/A    Number of Children: N/A  . Years of Education: N/A   Occupational History  . Not on file.   Social History Main Topics  . Smoking status: Former Smoker -- 1.00 packs/day    Quit date: 03/28/2000  . Smokeless tobacco: Not on file  . Alcohol Use: No  . Drug Use: No  . Sexual Activity: No   Other Topics Concern  . Not on file   Social History Narrative  . No narrative on file    Family History  Problem Relation Age of Onset  . Colon polyps Sister     2010  . Cancer Maternal Aunt     possible post menopausal breast cancer  . Heart disease Mother   . Stroke Father   . Heart failure Father       Jeannette Corpus, MD 04/15/2013, 9:20 AM

## 2013-06-01 ENCOUNTER — Other Ambulatory Visit: Payer: Self-pay | Admitting: *Deleted

## 2013-06-01 MED ORDER — HYDROCHLOROTHIAZIDE 25 MG PO TABS: ORAL_TABLET | ORAL | Status: DC

## 2013-06-18 ENCOUNTER — Other Ambulatory Visit: Payer: Self-pay | Admitting: *Deleted

## 2013-06-18 DIAGNOSIS — C549 Malignant neoplasm of corpus uteri, unspecified: Secondary | ICD-10-CM

## 2013-06-18 DIAGNOSIS — C50919 Malignant neoplasm of unspecified site of unspecified female breast: Secondary | ICD-10-CM

## 2013-06-19 ENCOUNTER — Telehealth: Payer: Self-pay | Admitting: Oncology

## 2013-06-19 ENCOUNTER — Encounter: Payer: Self-pay | Admitting: Oncology

## 2013-06-19 ENCOUNTER — Ambulatory Visit (HOSPITAL_BASED_OUTPATIENT_CLINIC_OR_DEPARTMENT_OTHER): Payer: Medicare Other | Admitting: Oncology

## 2013-06-19 ENCOUNTER — Other Ambulatory Visit (HOSPITAL_BASED_OUTPATIENT_CLINIC_OR_DEPARTMENT_OTHER): Payer: Medicare Other

## 2013-06-19 VITALS — BP 153/75 | HR 64 | Temp 97.9°F | Resp 18 | Ht 66.0 in | Wt 205.8 lb

## 2013-06-19 DIAGNOSIS — C50919 Malignant neoplasm of unspecified site of unspecified female breast: Secondary | ICD-10-CM

## 2013-06-19 DIAGNOSIS — D0590 Unspecified type of carcinoma in situ of unspecified breast: Secondary | ICD-10-CM | POA: Diagnosis not present

## 2013-06-19 DIAGNOSIS — C549 Malignant neoplasm of corpus uteri, unspecified: Secondary | ICD-10-CM

## 2013-06-19 DIAGNOSIS — D696 Thrombocytopenia, unspecified: Secondary | ICD-10-CM | POA: Diagnosis not present

## 2013-06-19 LAB — CBC WITH DIFFERENTIAL/PLATELET
BASO%: 1 % (ref 0.0–2.0)
BASOS ABS: 0.1 10*3/uL (ref 0.0–0.1)
EOS%: 5.1 % (ref 0.0–7.0)
Eosinophils Absolute: 0.3 10*3/uL (ref 0.0–0.5)
HEMATOCRIT: 43.5 % (ref 34.8–46.6)
HEMOGLOBIN: 15 g/dL (ref 11.6–15.9)
LYMPH%: 22.5 % (ref 14.0–49.7)
MCH: 30.3 pg (ref 25.1–34.0)
MCHC: 34.6 g/dL (ref 31.5–36.0)
MCV: 87.8 fL (ref 79.5–101.0)
MONO#: 0.5 10*3/uL (ref 0.1–0.9)
MONO%: 10.8 % (ref 0.0–14.0)
NEUT#: 3.1 10*3/uL (ref 1.5–6.5)
NEUT%: 60.6 % (ref 38.4–76.8)
PLATELETS: 137 10*3/uL — AB (ref 145–400)
RBC: 4.96 10*6/uL (ref 3.70–5.45)
RDW: 13.5 % (ref 11.2–14.5)
WBC: 5.1 10*3/uL (ref 3.9–10.3)
lymph#: 1.1 10*3/uL (ref 0.9–3.3)

## 2013-06-19 LAB — COMPREHENSIVE METABOLIC PANEL (CC13)
ALK PHOS: 48 U/L (ref 40–150)
ALT: 38 U/L (ref 0–55)
AST: 26 U/L (ref 5–34)
Albumin: 4 g/dL (ref 3.5–5.0)
Anion Gap: 9 mEq/L (ref 3–11)
BILIRUBIN TOTAL: 0.55 mg/dL (ref 0.20–1.20)
BUN: 11.8 mg/dL (ref 7.0–26.0)
CALCIUM: 10.2 mg/dL (ref 8.4–10.4)
CHLORIDE: 102 meq/L (ref 98–109)
CO2: 31 mEq/L — ABNORMAL HIGH (ref 22–29)
CREATININE: 0.8 mg/dL (ref 0.6–1.1)
Glucose: 124 mg/dl (ref 70–140)
Potassium: 3.7 mEq/L (ref 3.5–5.1)
Sodium: 142 mEq/L (ref 136–145)
Total Protein: 6.7 g/dL (ref 6.4–8.3)

## 2013-06-19 NOTE — Telephone Encounter (Signed)
, °

## 2013-06-19 NOTE — Progress Notes (Signed)
OFFICE PROGRESS NOTE  CC  [name] Pardon, MD [facility] [address], [place] [address] Dr. Fanny Skates  DIAGNOSIS: 80 year old female with history of 0.6 cm DCIS of the right breast diagnosed May 2011  PRIOR THERAPY:  #1 patient underwent a lumpectomy on 12/02/2009 with sentinel node biopsy. Her final pathology revealed a DCIS grade 2 sentinel node was negative for metastatic disease.  #2 patient was then started on partial breast radiation with MammoSite in July 2011.  #3 patient did not go on to chemoprevention. But she has been on observation only.  #4 patient recently had aortic valve replacement surgery.  [place] for breast cancer  INTERVAL HISTORY: [name] 80 y.o. female returns for Followup visit today. Her last visit with me was back in February 2013. S She is without any bleeding problems she has no cold sore flus. She denies any nausea or vomiting. Patient does complain of having tenderness in the right breast at the incision site there is noted to be some fullness in this area which is on palpation tender to touch. It feels more like a scar tissue. She does have upcoming mammograms. She was seen by Dr. Fanny Skates who also recommended an ultrasound. Remainder of the 10 point review of systems is unremarkable.  MEDICAL HISTORY: Past Medical History  Diagnosis Date  . Family history of colonic polyps   . Other abnormal glucose   . Pain in joint, shoulder region   . Unspecified hearing loss   . Aortic stenosis     s/p tissue AVR 05/2011  . Alcohol abuse, unspecified   . Dyskinesia of esophagus   . Diaphragmatic hernia without mention of obstruction or gangrene   . Tobacco use disorder   . Internal hemorrhoids without mention of complication   . Diverticulosis of colon (without mention of hemorrhage)   . Postmenopausal bleeding   . HTN (hypertension)   . HLD (hyperlipidemia)   . Malignant neoplasm of corpus  uteri, except isthmus   . Breast CA     rt  . Arthritis   . Heart murmur     ALLERGIES:  is allergic to calcium; diovan; ezetimibe; ramipril; and simvastatin.  MEDICATIONS:  Current Outpatient Prescriptions  Medication Sig Dispense Refill  . amLODipine (NORVASC) 5 MG tablet Take 1 tablet (5 mg total) by mouth daily.  90 tablet  3  . aspirin 325 MG tablet Take 325 mg by mouth. Take 1/2 tablet daily      . carvedilol (COREG) 25 MG tablet Take 1 tablet (25 mg total) by mouth 2 (two) times daily with a meal.  180 tablet  3  . Cholecalciferol (VITAMIN D) 1000 UNITS capsule Take 1 capsule (1,000 Units total) by mouth 2 (two) times daily.  180 capsule  3  . diphenhydrAMINE (BENADRYL) 50 MG tablet Take 50 mg by mouth at bedtime as needed.      . hydrochlorothiazide (HYDRODIURIL) 25 MG tablet TAKE 1 TABLET DAILY  90 tablet  0  . Omega-3 Fatty Acids (FISH OIL) 1000 MG CAPS Take 2,000 mg by mouth daily.       . potassium chloride SA (K-DUR,KLOR-CON) 20 MEQ tablet Take 1 tablet (20 mEq total) by mouth 2 (two) times daily.  180 tablet  3  . Psyllium (METAMUCIL PO) Take by mouth.       No current facility-administered medications for this visit.    SURGICAL HISTORY:  Past Surgical History  Procedure Laterality Date  .  Rotator cuff repair  05/2008    right  . Laparoscopy w/ total bilateral pelvic and peri-aortic lymphadenectomy  11/04/2007  . Total abdominal hysterectomy w/ bilateral salpingoophorectomy  11/04/07    salpingo-oophrectomy  . Cataract extraction      bil  . Breast biopsy  5/11    DCIS  . Breast lumpectomy  11/2009    DCIS, neg sentinel lymph node biopsy  . Abdominal hysterectomy    . Aortic valve replacement  06/13/2011    Procedure: AORTIC VALVE REPLACEMENT (AVR);  Surgeon: Gaye Pollack, MD;  Location: [facility];  Service: Open Heart Surgery;  Laterality: N/A;  . Breast lumpectomy    . Shoulder surgery    . Abdominal hysterectomy    . Paraaortic lymphadenectomy      REVIEW OF  SYSTEMS:  Pertinent items are noted in HPI.   PHYSICAL EXAMINATION: General appearance: alert, cooperative and appears stated age Lymph nodes: Cervical, supraclavicular, and axillary nodes normal. Resp: clear to auscultation bilaterally Back: symmetric, no curvature. ROM normal. No CVA tenderness. Cardio: regular rate and rhythm GI: soft, non-tender; bowel sounds normal; no masses,  no organomegaly Extremities: extremities normal, atraumatic, no cyanosis or edema Neurologic: Grossly normal Right breast exam: Healed surgical scar there is fullness in this region but tenderness on palpation. No other masses no nipple discharge retraction or inversion. Left breast no masses or nipple discharge.  ECOG PERFORMANCE STATUS: 1 - Symptomatic but completely ambulatory  Blood pressure 153/75, pulse 64, temperature 97.9 F (36.6 C), temperature source Oral, resp. rate 18, height 5\' 6"  (1.676 m), weight 205 lb 12.8 oz (93.35 kg), last menstrual period 05/29/2007.  LABORATORY DATA: Lab Results  Component Value Date   WBC 5.1 06/19/2013   HGB 15.0 06/19/2013   HCT 43.5 06/19/2013   MCV 87.8 06/19/2013   PLT 137* 06/19/2013      Chemistry      Component Value Date/Time   NA 142 06/19/2013 0926   NA 138 12/31/2012 0942   K 3.7 06/19/2013 0926   K 3.7 12/31/2012 0942   CL 101 12/31/2012 0942   CO2 31* 06/19/2013 0926   CO2 30 12/31/2012 0942   BUN 11.8 06/19/2013 0926   BUN 14 12/31/2012 0942   CREATININE 0.8 06/19/2013 0926   CREATININE 0.8 12/31/2012 0942      Component Value Date/Time   CALCIUM 10.2 06/19/2013 0926   CALCIUM 10.0 12/31/2012 0942   ALKPHOS 48 06/19/2013 0926   ALKPHOS 44 03/18/2012 0900   AST 26 06/19/2013 0926   AST 21 03/18/2012 0900   ALT 38 06/19/2013 0926   ALT 25 03/18/2012 0900   BILITOT 0.55 06/19/2013 0926   BILITOT 0.7 03/18/2012 0900       RADIOGRAPHIC STUDIES:  No results found.  ASSESSMENT: 81 year old female with  #1 ductal carcinoma in situ of the right breast  measuring 0.6 cm diagnosed May 2011. She had a lumpectomy on 12/02/2009 that showed a grade 2 DCIS she underwent MammoSite treatment. She declined tamoxifen or an aromatase inhibitor. She is on observation only.  #2 thrombocytopenia: Patient prior had had normal counts but starting in April of this year she developed mild thrombocytopenia. Her cardiac surgery was performed in January for the aortic valve replacement. I do suspect that her thrombocytopenia could be related to the fact or she could possibly have early ITP. At this time she is asymptomatic and observation and serial platelet count may be appropriate.   PLAN:   #1 patient  will continue to be seen every 12 months. She will proceed with getting her mammograms performed in August. We will continue to monitor her her platelets.  #2 she will continue to be followed by Dr. Rica Mote as well as Dr. Dalbert Batman.   All questions were answered. The patient knows to call the clinic with any problems, questions or concerns. We can certainly see the patient much sooner if necessary.  I spent 15 minutes counseling the patient face to face. The total time spent in the appointment was 30 minutes.    Marcy Panning, MD Medical/Oncology [facility] [phone] (beeper) [phone] (Office)  06/19/2013, 10:39 AM

## 2013-07-03 ENCOUNTER — Ambulatory Visit (INDEPENDENT_AMBULATORY_CARE_PROVIDER_SITE_OTHER): Payer: Medicare Other | Admitting: Cardiovascular Disease

## 2013-07-03 ENCOUNTER — Encounter: Payer: Self-pay | Admitting: Cardiovascular Disease

## 2013-07-03 VITALS — BP 154/73 | HR 64 | Ht 66.0 in | Wt 205.0 lb

## 2013-07-03 DIAGNOSIS — E785 Hyperlipidemia, unspecified: Secondary | ICD-10-CM

## 2013-07-03 DIAGNOSIS — I10 Essential (primary) hypertension: Secondary | ICD-10-CM

## 2013-07-03 DIAGNOSIS — I359 Nonrheumatic aortic valve disorder, unspecified: Secondary | ICD-10-CM | POA: Diagnosis not present

## 2013-07-03 DIAGNOSIS — R0602 Shortness of breath: Secondary | ICD-10-CM

## 2013-07-03 MED ORDER — POTASSIUM CHLORIDE CRYS ER 20 MEQ PO TBCR
20.0000 meq | EXTENDED_RELEASE_TABLET | Freq: Every day | ORAL | Status: DC
Start: 2013-07-03 — End: 2014-10-16

## 2013-07-03 MED ORDER — SPIRONOLACTONE 25 MG PO TABS: 25.0000 mg | ORAL_TABLET | Freq: Every day | ORAL | Status: DC

## 2013-07-03 NOTE — Patient Instructions (Addendum)
Your physician has recommended you make the following change in your medication:  START Spironolactone 25 mg Once Daily DECREASE KDur (potassium supplement) to 20 mEq once daily  Your physician recommends that you return for lab work in: One Month for a [facility] has requested that you have an echocardiogram. Echocardiography is a painless test that uses sound waves to create images of your heart. It provides your doctor with information about the size and shape of your heart and how well your heart's chambers and valves are working. This procedure takes approximately one hour. There are no restrictions for this procedure.  Your physician wants you to follow-up in: 6 months with Dr. Burt Knack AFTER your Echocardiogram.  You will receive a reminder letter in the mail two months in advance. If you don't receive a letter, please call our office to schedule the follow-up appointment.

## 2013-07-03 NOTE — Progress Notes (Signed)
HPI:  80 year old woman presenting for followup evaluation. The patient is followed for aortic stenosis and underwent bioprosthetic aortic valve replacement in 2013. When I saw her last in August 2014 she complained of aggressive shortness of breath with activity. An echocardiogram showed normal prosthetic valve function and normal LV function. There was a mildly elevated transvalvular gradient noted. A chest x-ray was within normal limits and lab work was also unremarkable with a normal BNP. She presents today for followup.  Patient reports stable symptoms overall. She still has some shortness of breath with activity, but no significant change. She has no resting shortness of breath. She is able to do the things she wants to do. She did have some chest pain and she related to indigestion and acid reflux. She took times for this and it has now resolved. She had some pain in her teeth and gums and attributed this to amlodipine. This is also improved and she hasn't had much trouble over the last few months. She's had no lightheadedness, palpitations, or syncope. Blood pressure at home has ranged from the 130s to 150s.   Outpatient Encounter Prescriptions as of 07/03/2013  Medication Sig  . amLODipine (NORVASC) 5 MG tablet Take 1 tablet (5 mg total) by mouth daily.  Marland Kitchen aspirin 325 MG tablet Take 325 mg by mouth. Take 1/2 tablet daily  . carvedilol (COREG) 25 MG tablet Take 1 tablet (25 mg total) by mouth 2 (two) times daily with a meal.  . Cholecalciferol (VITAMIN D) 1000 UNITS capsule Take 1 capsule (1,000 Units total) by mouth 2 (two) times daily.  . diphenhydrAMINE (BENADRYL) 50 MG tablet Take 50 mg by mouth daily.   . hydrochlorothiazide (HYDRODIURIL) 25 MG tablet TAKE 1 TABLET DAILY  . Omega-3 Fatty Acids (FISH OIL) 1000 MG CAPS Take 2,000 mg by mouth daily.   . potassium chloride SA (K-DUR,KLOR-CON) 20 MEQ tablet Take 1 tablet (20 mEq total) by mouth 2 (two) times daily.  . Psyllium (METAMUCIL  PO) Take by mouth.    Allergies  Allergen Reactions  . Calcium     REACTION: constipation  . Diovan [Valsartan]     REACTION: abdominal pain  . Ezetimibe     REACTION: fatigue  . Ramipril     REACTION: abdominal pain  . Simvastatin     REACTION: leg pain and body aches    Past Medical History  Diagnosis Date  . Family history of colonic polyps   . Other abnormal glucose   . Pain in joint, shoulder region   . Unspecified hearing loss   . Aortic stenosis     s/p tissue AVR 05/2011  . Alcohol abuse, unspecified   . Dyskinesia of esophagus   . Diaphragmatic hernia without mention of obstruction or gangrene   . Tobacco use disorder   . Internal hemorrhoids without mention of complication   . Diverticulosis of colon (without mention of hemorrhage)   . Postmenopausal bleeding   . HTN (hypertension)   . HLD (hyperlipidemia)   . Malignant neoplasm of corpus uteri, except isthmus   . Breast CA     rt  . Arthritis   . Heart murmur     ROS: Negative except as per HPI  BP 154/73  Pulse 64  Ht 5\' 6"  (1.676 m)  Wt 205 lb (92.987 kg)  BMI 33.10 kg/m2  LMP 05/29/2007  PHYSICAL EXAM: Pt is alert and oriented, NAD HEENT: normal Neck: JVP - normal, carotids 2+= without bruits Lungs:  CTA bilaterally CV: RRR with grade 2/6 systolic ejection murmur at the left sternal border Abd: soft, NT, Positive BS, no hepatomegaly Ext: 1+ pretibial edema bilaterally, distal pulses intact and equal Skin: warm/dry no rash  EKG:  Normal sinus rhythm with left ventricular hypertrophy and QRS widening. 64 beats per minute.  2-D echocardiogram August 2014: Left ventricle: The cavity size was normal. Wall thickness was increased in a pattern of moderate LVH. Systolic function was normal. The estimated ejection fraction was in the range of 55% to 65%. Wall motion was normal; there were no regional wall motion abnormalities. Features are consistent with a pseudonormal left ventricular  filling pattern, with concomitant abnormal relaxation and increased filling pressure (grade 2 diastolic dysfunction).  ------------------------------------------------------------ Aortic valve: A bioprosthesis was present. Doppler: No regurgitation. VTI ratio of LVOT to aortic valve: 0.38. Valve area: 1.09cm^2(VTI). Indexed valve area: 0.54cm^2/m^2 (VTI). Peak velocity ratio of LVOT to aortic valve: 0.38. Valve area: 1.07cm^2 (Vmax). Indexed valve area: 0.53cm^2/m^2 (Vmax). Mean gradient: 68mm Hg (S). Peak gradient: 21mm Hg (S).  ------------------------------------------------------------ Aorta: Aortic root: The aortic root was normal in size.  ------------------------------------------------------------ Mitral valve: Calcified annulus. Leaflet separation was normal. Doppler: Transvalvular velocity was within the normal range. There was no evidence for stenosis. Mild regurgitation. Peak gradient: 3mm Hg (D).  ------------------------------------------------------------ Left atrium: The atrium was mildly dilated.  ------------------------------------------------------------ Right ventricle: The cavity size was normal. Systolic function was normal.  ------------------------------------------------------------ Pulmonic valve: Structurally normal valve. Cusp separation was normal. Doppler: Transvalvular velocity was within the normal range. Trivial regurgitation.  ------------------------------------------------------------ Tricuspid valve: Structurally normal valve. Leaflet separation was normal. Doppler: Transvalvular velocity was within the normal range. Mild regurgitation.  ------------------------------------------------------------ Pulmonary artery: Systolic pressure was mildly increased.  ------------------------------------------------------------ Right atrium: The atrium was normal in size.  ------------------------------------------------------------ Pericardium:  There was no pericardial effusion.  ------------------------------------------------------------ Systemic veins: Inferior vena cava: The vessel was normal in size; the respirophasic diameter changes were in the normal range (= 50%); findings are consistent with normal central venous pressure.     ASSESSMENT AND PLAN: 1. Aortic stenosis status post bioprosthetic aortic valve replacement. The patient is stable. I am going to repeat an echo before her office visit in August and she had elevated transvalvular velocities on her first postop echo. If this is stable we'll just follow her clinically after that.  2. Hypertension with suboptimal control. The patient is allergic to ACE inhibitors and ARB's. I don't think she can tolerate more amlodipine because of leg swelling. Best option I think is to add spironolactone 25 mg daily. Will reduce her potassium chloride 40 mEq once daily and will check a metabolic panel in 2 weeks.  For follow-up I will see her back in 6 months for an echocardiogram before that visit.   Sherren Mocha 07/03/2013 10:03 AM

## 2013-07-06 ENCOUNTER — Other Ambulatory Visit: Payer: Self-pay | Admitting: *Deleted

## 2013-07-06 DIAGNOSIS — I359 Nonrheumatic aortic valve disorder, unspecified: Secondary | ICD-10-CM

## 2013-07-06 DIAGNOSIS — R0602 Shortness of breath: Secondary | ICD-10-CM

## 2013-07-06 DIAGNOSIS — I10 Essential (primary) hypertension: Secondary | ICD-10-CM

## 2013-07-06 DIAGNOSIS — E785 Hyperlipidemia, unspecified: Secondary | ICD-10-CM

## 2013-07-06 MED ORDER — AMLODIPINE BESYLATE 5 MG PO TABS: 5.0000 mg | ORAL_TABLET | Freq: Every day | ORAL | Status: DC

## 2013-07-06 MED ORDER — CARVEDILOL 25 MG PO TABS
25.0000 mg | ORAL_TABLET | Freq: Two times a day (BID) | ORAL | Status: DC
Start: 2013-07-06 — End: 2014-07-02

## 2013-07-31 ENCOUNTER — Ambulatory Visit (INDEPENDENT_AMBULATORY_CARE_PROVIDER_SITE_OTHER): Payer: Medicare Other | Admitting: *Deleted

## 2013-07-31 DIAGNOSIS — C50919 Malignant neoplasm of unspecified site of unspecified female breast: Secondary | ICD-10-CM

## 2013-07-31 LAB — BASIC METABOLIC PANEL
BUN: 13 mg/dL (ref 6–23)
CHLORIDE: 100 meq/L (ref 96–112)
CO2: 29 meq/L (ref 19–32)
Calcium: 9.8 mg/dL (ref 8.4–10.5)
Creatinine, Ser: 1 mg/dL (ref 0.4–1.2)
GFR: 59.77 mL/min — ABNORMAL LOW (ref >60.00)
Glucose, Bld: 104 mg/dL — ABNORMAL HIGH (ref 70–99)
POTASSIUM: 3.8 meq/L (ref 3.5–5.1)
Sodium: 136 mEq/L (ref 135–145)

## 2013-08-29 ENCOUNTER — Other Ambulatory Visit: Payer: Self-pay | Admitting: Cardiovascular Disease

## 2013-10-09 ENCOUNTER — Encounter: Payer: Self-pay | Admitting: Family Medicine

## 2013-10-09 ENCOUNTER — Ambulatory Visit (INDEPENDENT_AMBULATORY_CARE_PROVIDER_SITE_OTHER): Payer: Medicare Other | Admitting: Family Medicine

## 2013-10-09 VITALS — BP 159/81 | HR 71 | Ht 66.0 in | Wt 208.0 lb

## 2013-10-09 DIAGNOSIS — C549 Malignant neoplasm of corpus uteri, unspecified: Secondary | ICD-10-CM

## 2013-10-09 DIAGNOSIS — Z0000 Encounter for general adult medical examination without abnormal findings: Secondary | ICD-10-CM

## 2013-10-09 DIAGNOSIS — Z8541 Personal history of malignant neoplasm of cervix uteri: Secondary | ICD-10-CM | POA: Diagnosis not present

## 2013-10-09 DIAGNOSIS — I10 Essential (primary) hypertension: Secondary | ICD-10-CM | POA: Diagnosis not present

## 2013-10-09 NOTE — Progress Notes (Signed)
  Subjective:     [name] is a 80 y.o. female and is here for a comprehensive physical exam. The patient reports problems - lower extremety swelling and rash. Weight is up and so is blood pressure.  Has h/o endometrial CA treated surgically in 2009.  No evidence of recurrence.  Released by GYN/ONC and no further f/u needed.  New recommendations specify no need for continued vaginal cytology.  History   Social History  . Marital Status: Married    Spouse Name: N/A    Number of Children: N/A  . Years of Education: N/A   Occupational History  . Not on file.   Social History Main Topics  . Smoking status: Former Smoker -- 1.00 packs/day    Quit date: 03/28/2000  . Smokeless tobacco: Not on file  . Alcohol Use: No  . Drug Use: No  . Sexual Activity: No   Other Topics Concern  . Not on file   Social History Narrative  . No narrative on file   Health Maintenance  Topic Date Due  . Pap Smear  04/05/2012  . Influenza Vaccine  12/26/2013  . Mammogram  01/23/2014  . Colonoscopy  08/07/2018  . Tetanus/tdap  03/09/2019  . Pneumococcal Polysaccharide Vaccine Age 56 And Over  Completed  . Zostavax  Completed    The following portions of the patient's history were reviewed and updated as appropriate: allergies, current medications, past family history, past medical history, past social history, past surgical history and problem list.  Review of Systems Pertinent items are noted in HPI.   Objective:    BP 159/81  Pulse 71  Ht 5\' 6"  (1.676 m)  Wt 208 lb (94.348 kg)  BMI 33.59 kg/m2  LMP 05/29/2007 General appearance: alert, cooperative and appears stated age Head: Normocephalic, without obvious abnormality, atraumatic Neck: no adenopathy, supple, symmetrical, trachea midline and thyroid not enlarged, symmetric, no tenderness/mass/nodules Lungs: clear to auscultation bilaterally Breasts: positive findings: firm, hard, fixed and non-tender nodule located on the right 12  o'clock under old scar Heart: regular rate and rhythm and systolic murmur: holosystolic 2/6, blowing at 2nd left intercostal space Abdomen: soft, non-tender; bowel sounds normal; no masses,  no organomegaly Pelvic: external genitalia normal and atrophic vagina. Normal appearing cuff, no pelvic mass Extremities: edema 3+ and venous stasis dermatitis noted Pulses: 2+ and symmetric Skin: petechiae noted on lower extremeties bilaterally Lymph nodes: Cervical, supraclavicular, and axillary nodes normal. Neurologic: Grossly normal    Assessment:    Multiple medical problems. No GYN issues right now.   Plan:    f/u with PCP for all problems Malignant neoplasm of corpus uteri, except isthmus No need for further cytology.  Annual pelvic is ok since she is now 6 years removed from treatment.   Return in 1 year (on 10/10/2014) for a CPE.  See After Visit Summary for Counseling Recommendations

## 2013-10-09 NOTE — Assessment & Plan Note (Signed)
No need for further cytology.  Annual pelvic is ok since she is now 6 years removed from treatment.

## 2013-10-09 NOTE — Patient Instructions (Signed)

## 2013-11-03 ENCOUNTER — Telehealth: Payer: Self-pay | Admitting: Cardiovascular Disease

## 2013-11-03 NOTE — Telephone Encounter (Signed)
I have reviewed Dr Antionette Char 07/03/2013 office note and the pt is due for an Echocardiogram and Office Visit in August with Dr Burt Knack. I will contact the pt with these appointments.

## 2013-11-03 NOTE — Telephone Encounter (Signed)
I spoke with the pt and appointments are scheduled.

## 2013-11-03 NOTE — Telephone Encounter (Signed)
New message           Pt is convinced she was told she has to come in August to see Dr Burt [name] / Recall and office notes say 6 months after echo in January 2016. Can you work pt in? Pt is not having any issues.

## 2013-12-21 ENCOUNTER — Ambulatory Visit (HOSPITAL_COMMUNITY): Payer: Medicare Other | Attending: Cardiology | Admitting: Radiology

## 2013-12-21 DIAGNOSIS — Z954 Presence of other heart-valve replacement: Secondary | ICD-10-CM | POA: Diagnosis not present

## 2013-12-21 DIAGNOSIS — I10 Essential (primary) hypertension: Secondary | ICD-10-CM | POA: Diagnosis not present

## 2013-12-21 DIAGNOSIS — E785 Hyperlipidemia, unspecified: Secondary | ICD-10-CM | POA: Diagnosis not present

## 2013-12-21 DIAGNOSIS — I359 Nonrheumatic aortic valve disorder, unspecified: Secondary | ICD-10-CM

## 2013-12-21 DIAGNOSIS — Z87891 Personal history of nicotine dependence: Secondary | ICD-10-CM | POA: Insufficient documentation

## 2013-12-21 DIAGNOSIS — I379 Nonrheumatic pulmonary valve disorder, unspecified: Secondary | ICD-10-CM | POA: Insufficient documentation

## 2013-12-21 DIAGNOSIS — I059 Rheumatic mitral valve disease, unspecified: Secondary | ICD-10-CM | POA: Insufficient documentation

## 2013-12-21 DIAGNOSIS — I079 Rheumatic tricuspid valve disease, unspecified: Secondary | ICD-10-CM | POA: Diagnosis not present

## 2013-12-21 DIAGNOSIS — I517 Cardiomegaly: Secondary | ICD-10-CM | POA: Diagnosis not present

## 2013-12-21 DIAGNOSIS — R0602 Shortness of breath: Secondary | ICD-10-CM

## 2013-12-21 NOTE — Progress Notes (Signed)
Echocardiogram performed.  

## 2013-12-23 ENCOUNTER — Other Ambulatory Visit: Payer: Self-pay | Admitting: Family Medicine

## 2013-12-23 DIAGNOSIS — Z853 Personal history of malignant neoplasm of breast: Secondary | ICD-10-CM

## 2014-01-01 ENCOUNTER — Encounter: Payer: Self-pay | Admitting: Cardiovascular Disease

## 2014-01-01 ENCOUNTER — Other Ambulatory Visit (HOSPITAL_COMMUNITY): Payer: Medicare Other

## 2014-01-01 ENCOUNTER — Ambulatory Visit (INDEPENDENT_AMBULATORY_CARE_PROVIDER_SITE_OTHER): Payer: Medicare Other | Admitting: Cardiovascular Disease

## 2014-01-01 VITALS — BP 138/78 | HR 61 | Ht 66.0 in | Wt 200.0 lb

## 2014-01-01 DIAGNOSIS — I359 Nonrheumatic aortic valve disorder, unspecified: Secondary | ICD-10-CM | POA: Diagnosis not present

## 2014-01-01 DIAGNOSIS — I10 Essential (primary) hypertension: Secondary | ICD-10-CM | POA: Diagnosis not present

## 2014-01-01 DIAGNOSIS — E785 Hyperlipidemia, unspecified: Secondary | ICD-10-CM

## 2014-01-01 NOTE — Progress Notes (Signed)
HPI:  80 year old woman presenting for followup evaluation. The patient is followed for aortic stenosis and underwent bioprosthetic aortic valve replacement in 2013. The patient is also followed for hypertension.  She is doing well at this time. She was having a lot of problems with fatigue and poor blood pressure control. She changed her diet fairly dramatically, and has lost some weight. She feels much better. Her blood pressure is better controlled and her energy level is improved. She denies shortness of breath, chest pain, lightheadedness, or syncope. Her chronic leg swelling is a little bit improved but still persists. It is worse as the day progresses. She denies orthopnea or PND.   Outpatient Encounter Prescriptions as of 01/01/2014  Medication Sig  . amLODipine (NORVASC) 5 MG tablet Take 1 tablet (5 mg total) by mouth daily.  Marland Kitchen aspirin 325 MG tablet Take 325 mg by mouth. Take 1/2 tablet daily  . carvedilol (COREG) 25 MG tablet Take 1 tablet (25 mg total) by mouth 2 (two) times daily with a meal.  . Cholecalciferol (VITAMIN D) 1000 UNITS capsule Take 1 capsule (1,000 Units total) by mouth 2 (two) times daily.  . diphenhydrAMINE (BENADRYL) 50 MG tablet Take 50 mg by mouth every 6 (six) hours as needed.   . hydrochlorothiazide (HYDRODIURIL) 25 MG tablet TAKE 1 TABLET DAILY  . Omega-3 Fatty Acids (FISH OIL) 1000 MG CAPS Take 2,000 mg by mouth daily.   . potassium chloride SA (K-DUR,KLOR-CON) 20 MEQ tablet Take 1 tablet (20 mEq total) by mouth daily.  . Psyllium (METAMUCIL PO) Take by mouth as needed.   Marland Kitchen spironolactone (ALDACTONE) 25 MG tablet Take 1 tablet (25 mg total) by mouth daily.    Allergies  Allergen Reactions  . Calcium     REACTION: constipation  . Diovan [Valsartan]     REACTION: abdominal pain  . Ezetimibe     REACTION: fatigue  . Ramipril     REACTION: abdominal pain  . Simvastatin     REACTION: leg pain and body aches    Past Medical History  Diagnosis Date    . Family history of colonic polyps   . Other abnormal glucose   . Pain in joint, shoulder region   . Unspecified hearing loss   . Aortic stenosis     s/p tissue AVR 05/2011  . Alcohol abuse, unspecified   . Dyskinesia of esophagus   . Diaphragmatic hernia without mention of obstruction or gangrene   . Tobacco use disorder   . Internal hemorrhoids without mention of complication   . Diverticulosis of colon (without mention of hemorrhage)   . Postmenopausal bleeding   . HTN (hypertension)   . HLD (hyperlipidemia)   . Malignant neoplasm of corpus uteri, except isthmus   . Breast CA     rt  . Arthritis   . Heart murmur     ROS: Negative except as per HPI  BP 138/78  Pulse 61  Ht 5\' 6"  (1.676 m)  Wt 200 lb (90.719 kg)  BMI 32.30 kg/m2  LMP 05/29/2007  PHYSICAL EXAM: Pt is alert and oriented, NAD HEENT: normal Neck: JVP - normal, carotids 2+= without bruits Lungs: CTA bilaterally CV: RRR with grade 2/6 systolic ejection murmur at the left upper sternal border Abd: soft, NT, Positive BS, no hepatomegaly Ext: 1+ pretibial edema bilaterally, distal pulses intact and equal Skin: warm/dry no rash  EKG:  Normal sinus rhythm 61 beats per minute, left ventricular hypertrophy with QRS widening.  2D Echo 12/21/2013: Study Conclusions  - Left ventricle: The cavity size was normal. Wall thickness was increased in a pattern of mild LVH. Systolic function was normal. The estimated ejection fraction was in the range of 55% to 60%. Wall motion was normal; there were no regional wall motion abnormalities. Doppler parameters are consistent with abnormal left ventricular relaxation (grade 1 diastolic dysfunction). - Aortic valve: A bioprosthesis was present. - Mitral valve: There was mild regurgitation. - Left atrium: The atrium was mildly dilated. - Pulmonary arteries: Systolic pressure was mildly increased.  Impressions:  - Normal LV function; grade 1 diastolic dysfunction;  bioprosthetic aortic valve with mildy elevated gradient (mean 20 mmHg); no AI; no significant change compared to study of 01/02/13.  ASSESSMENT AND PLAN: 1. Aortic valve disorder status post bioprosthetic aortic valve replacement. Her echocardiogram was reviewed and shows stable bioprosthetic valve function with a mildly elevated gradient, unchanged from the previous study one year ago. She has [place] Heart Association functional class I symptoms. Recommend a repeat echocardiogram next year to reassess her valve gradient. If this remains stable, I will plan on following her clinically. I'll see her back in one year after her echocardiogram.  2. Essential hypertension. Blood pressure is controlled on a combination of amlodipine, carvedilol, hydrochlorothiazide, and spironolactone. She has upcoming lab work to follow her renal function and potassium. We reviewed weight loss goals and dietary strategies.  Sherren Mocha MD 01/01/2014 8:39 AM

## 2014-01-01 NOTE — Patient Instructions (Signed)
Your physician has requested that you have an echocardiogram in 1 YEAR. Echocardiography is a painless test that uses sound waves to create images of your heart. It provides your doctor with information about the size and shape of your heart and how well your heart's chambers and valves are working. This procedure takes approximately one hour. There are no restrictions for this procedure.  Your physician wants you to follow-up in: 1 YEAR with Dr Burt Knack.  You will receive a reminder letter in the mail two months in advance. If you don't receive a letter, please call our office to schedule the follow-up appointment.  Your physician recommends that you continue on your current medications as directed. Please refer to the Current Medication list given to you today.

## 2014-01-07 ENCOUNTER — Telehealth: Payer: Self-pay | Admitting: Family Medicine

## 2014-01-07 DIAGNOSIS — M949 Disorder of cartilage, unspecified: Secondary | ICD-10-CM

## 2014-01-07 DIAGNOSIS — M899 Disorder of bone, unspecified: Secondary | ICD-10-CM

## 2014-01-07 DIAGNOSIS — D696 Thrombocytopenia, unspecified: Secondary | ICD-10-CM

## 2014-01-07 DIAGNOSIS — E559 Vitamin D deficiency, unspecified: Secondary | ICD-10-CM

## 2014-01-07 DIAGNOSIS — R7309 Other abnormal glucose: Secondary | ICD-10-CM

## 2014-01-07 DIAGNOSIS — E785 Hyperlipidemia, unspecified: Secondary | ICD-10-CM

## 2014-01-07 DIAGNOSIS — I10 Essential (primary) hypertension: Secondary | ICD-10-CM

## 2014-01-07 NOTE — Telephone Encounter (Signed)
Message copied by Abner Greenspan on Thu Jan 07, 2014 12:42 PM ------      Message from: Ellamae Sia      Created: Mon Jan 04, 2014  3:18 PM      Regarding: Lab orders for Friday,8.14.15       Patient is scheduled for CPX labs, please order future labs, Thanks , Terri       ------

## 2014-01-08 ENCOUNTER — Other Ambulatory Visit (INDEPENDENT_AMBULATORY_CARE_PROVIDER_SITE_OTHER): Payer: Medicare Other

## 2014-01-08 DIAGNOSIS — M899 Disorder of bone, unspecified: Secondary | ICD-10-CM | POA: Diagnosis not present

## 2014-01-08 DIAGNOSIS — E785 Hyperlipidemia, unspecified: Secondary | ICD-10-CM | POA: Diagnosis not present

## 2014-01-08 DIAGNOSIS — I10 Essential (primary) hypertension: Secondary | ICD-10-CM | POA: Diagnosis not present

## 2014-01-08 DIAGNOSIS — D696 Thrombocytopenia, unspecified: Secondary | ICD-10-CM

## 2014-01-08 DIAGNOSIS — R7309 Other abnormal glucose: Secondary | ICD-10-CM | POA: Diagnosis not present

## 2014-01-08 DIAGNOSIS — M949 Disorder of cartilage, unspecified: Secondary | ICD-10-CM

## 2014-01-08 DIAGNOSIS — E559 Vitamin D deficiency, unspecified: Secondary | ICD-10-CM | POA: Diagnosis not present

## 2014-01-08 LAB — COMPREHENSIVE METABOLIC PANEL
ALK PHOS: 42 U/L (ref 39–117)
ALT: 35 U/L (ref 0–35)
AST: 27 U/L (ref 0–37)
Albumin: 4 g/dL (ref 3.5–5.2)
BILIRUBIN TOTAL: 0.9 mg/dL (ref 0.2–1.2)
BUN: 13 mg/dL (ref 6–23)
CO2: 27 mEq/L (ref 19–32)
Calcium: 9.9 mg/dL (ref 8.4–10.5)
Chloride: 104 mEq/L (ref 96–112)
Creatinine, Ser: 0.8 mg/dL (ref 0.4–1.2)
GFR: 79.39 mL/min (ref >60.00)
GLUCOSE: 100 mg/dL — AB (ref 70–99)
Potassium: 4.2 mEq/L (ref 3.5–5.1)
Sodium: 135 mEq/L (ref 135–145)
Total Protein: 6.7 g/dL (ref 6.0–8.3)

## 2014-01-08 LAB — CBC WITH DIFFERENTIAL/PLATELET
BASOS PCT: 0.5 % (ref 0.0–3.0)
Basophils Absolute: 0 10*3/uL (ref 0.0–0.1)
EOS PCT: 1.9 % (ref 0.0–5.0)
Eosinophils Absolute: 0.1 10*3/uL (ref 0.0–0.7)
HEMATOCRIT: 43.5 % (ref 36.0–46.0)
Hemoglobin: 14.6 g/dL (ref 12.0–15.0)
LYMPHS ABS: 1 10*3/uL (ref 0.7–4.0)
Lymphocytes Relative: 15.3 % (ref 12.0–46.0)
MCHC: 33.6 g/dL (ref 30.0–36.0)
MCV: 90.7 fl (ref 78.0–100.0)
MONO ABS: 0.5 10*3/uL (ref 0.1–1.0)
Monocytes Relative: 8.5 % (ref 3.0–12.0)
Neutro Abs: 4.8 10*3/uL (ref 1.4–7.7)
Neutrophils Relative %: 73.8 % (ref 43.0–77.0)
PLATELETS: 146 10*3/uL — AB (ref 150.0–400.0)
RBC: 4.8 Mil/uL (ref 3.87–5.11)
RDW: 13.5 % (ref 11.5–15.5)
WBC: 6.5 10*3/uL (ref 4.0–10.5)

## 2014-01-08 LAB — LIPID PANEL
CHOLESTEROL: 172 mg/dL (ref 0–200)
HDL: 46.7 mg/dL (ref >39.00)
LDL Cholesterol: 110 mg/dL — ABNORMAL HIGH (ref 0–99)
NonHDL: 125.3
Total CHOL/HDL Ratio: 4
Triglycerides: 75 mg/dL (ref 0.0–149.0)
VLDL: 15 mg/dL (ref 0.0–40.0)

## 2014-01-08 LAB — HEMOGLOBIN A1C: HEMOGLOBIN A1C: 5.6 % (ref 4.6–6.5)

## 2014-01-08 LAB — VITAMIN D 25 HYDROXY (VIT D DEFICIENCY, FRACTURES): VITD: 51 ng/mL (ref 30.00–100.00)

## 2014-01-08 LAB — TSH: TSH: 2.23 u[IU]/mL (ref 0.35–4.50)

## 2014-01-15 ENCOUNTER — Encounter: Payer: Self-pay | Admitting: Family Medicine

## 2014-01-15 ENCOUNTER — Ambulatory Visit (INDEPENDENT_AMBULATORY_CARE_PROVIDER_SITE_OTHER): Payer: Medicare Other | Admitting: Family Medicine

## 2014-01-15 VITALS — BP 126/82 | HR 59 | Temp 98.0°F | Ht 66.0 in | Wt 198.0 lb

## 2014-01-15 DIAGNOSIS — E559 Vitamin D deficiency, unspecified: Secondary | ICD-10-CM

## 2014-01-15 DIAGNOSIS — R7309 Other abnormal glucose: Secondary | ICD-10-CM

## 2014-01-15 DIAGNOSIS — M949 Disorder of cartilage, unspecified: Secondary | ICD-10-CM

## 2014-01-15 DIAGNOSIS — Z23 Encounter for immunization: Secondary | ICD-10-CM

## 2014-01-15 DIAGNOSIS — Z0000 Encounter for general adult medical examination without abnormal findings: Secondary | ICD-10-CM | POA: Diagnosis not present

## 2014-01-15 DIAGNOSIS — E785 Hyperlipidemia, unspecified: Secondary | ICD-10-CM

## 2014-01-15 DIAGNOSIS — I10 Essential (primary) hypertension: Secondary | ICD-10-CM

## 2014-01-15 DIAGNOSIS — Z1211 Encounter for screening for malignant neoplasm of colon: Secondary | ICD-10-CM | POA: Insufficient documentation

## 2014-01-15 DIAGNOSIS — M899 Disorder of bone, unspecified: Secondary | ICD-10-CM | POA: Diagnosis not present

## 2014-01-15 MED ORDER — VITAMIN D 1000 UNITS PO CAPS: 1000.0000 [IU] | ORAL_CAPSULE | Freq: Two times a day (BID) | ORAL | Status: AC

## 2014-01-15 MED ORDER — HYDROCORTISONE 2.5 % RE CREA: 1.0000 "application " | TOPICAL_CREAM | Freq: Two times a day (BID) | RECTAL | Status: DC

## 2014-01-15 NOTE — Progress Notes (Signed)
Pre visit review using our clinic review tool, if applicable. No additional management support is needed unless otherwise documented below in the visit note. 

## 2014-01-15 NOTE — Assessment & Plan Note (Signed)
Reviewed health habits including diet and exercise and skin cancer prevention Reviewed appropriate screening tests for age  Also reviewed health mt list, fam hx and immunization status , as well as social and family history   See HPI Labs rev  Mammogram upcoming Given IFOB card  Will work on adv dir prevnar vaccine today

## 2014-01-15 NOTE — Progress Notes (Signed)
Subjective:    Patient ID: [name], female    DOB: 01-09-1934, 80 y.o.   MRN: [id number]  HPI I have personally reviewed the Medicare Annual Wellness questionnaire and have noted 1. The patient's medical and social history 2. Their use of alcohol, tobacco or illicit drugs 3. Their current medications and supplements 4. The patient's functional ability including ADL's, fall risks, home safety risks and hearing or visual             impairment. 5. Diet and physical activities 6. Evidence for depression or mood disorders  The patients weight, height, BMI have been recorded in the chart and visual acuity is per eye clinic.  I have made referrals, counseling and provided education to the patient based review of the above and I have provided the pt with a written personalized care plan for preventive services.  Wt is down 2 lb with bmi of 31 (had gained and then lost)  Has had a good summer overall - quiet overall  She had a bad time for 2 mo -her bp and weight went up and she was tired  She changed her diet - cutting down on sugar/ eliminating cereal/ added back a little fat , and eats an egg in am - more protien/ chicken and fish  More fruit and vegetables  Then she intentionally lost 15 lb  Feeling much better now  In addition her knees do not hurt    See scanned forms.  Routine anticipatory guidance given to patient.  See health maintenance. Colon cancer screening 3/10 - she had one polyp  Breast cancer screening mammogram 8/14 - is due for that - has it scheduled Sept 3rd  Self breast exam- no changes  Continues breast cancer f/u- sees oncol once yearly in Jan and sees Psychologist, sport and exercise next month  Flu vaccine last fall 10/14-will get one this fall Tetanus vaccine 10/10 Pneumovax 23 10/10 - will do that today  Zoster vaccine 3/09  Advance directive-does not have that  Cognitive function addressed- see scanned forms- and if abnormal then additional documentation follows. No memory  problems aside from normal for her -occ forgets names and then it comes to her a minute later   No falls Actually balance is improved also  PMH and SH reviewed  Meds, vitals, and allergies reviewed.   ROS: See HPI.  Otherwise negative.    bp is stable today  No cp or palpitations or headaches or edema  No side effects to medicines  BP Readings from Last 3 Encounters:  01/15/14 126/82  01/01/14 138/78  10/09/13 159/81       Chemistry      Component Value Date/Time   NA 135 01/08/2014 0840   NA 142 06/19/2013 0926   K 4.2 01/08/2014 0840   K 3.7 06/19/2013 0926   CL 104 01/08/2014 0840   CO2 27 01/08/2014 0840   CO2 31* 06/19/2013 0926   BUN 13 01/08/2014 0840   BUN 11.8 06/19/2013 0926   CREATININE 0.8 01/08/2014 0840   CREATININE 0.8 06/19/2013 0926      Component Value Date/Time   CALCIUM 9.9 01/08/2014 0840   CALCIUM 10.2 06/19/2013 0926   ALKPHOS 42 01/08/2014 0840   ALKPHOS 48 06/19/2013 0926   AST 27 01/08/2014 0840   AST 26 06/19/2013 0926   ALT 35 01/08/2014 0840   ALT 38 06/19/2013 0926   BILITOT 0.9 01/08/2014 0840   BILITOT 0.55 06/19/2013 0926     of  note -she says Dr Burt Knack changed her K dose   Hyperlipidemia Lab Results  Component Value Date   CHOL 172 01/08/2014   CHOL 212* 03/18/2012   CHOL 171 09/18/2011   Lab Results  Component Value Date   HDL 46.70 01/08/2014   HDL 45.40 03/18/2012   HDL 49.40 09/18/2011   Lab Results  Component Value Date   LDLCALC 110* 01/08/2014   LDLCALC 104* 09/18/2011   LDLCALC 72 03/22/2011   Lab Results  Component Value Date   TRIG 75.0 01/08/2014   TRIG 84.0 03/18/2012   TRIG 89.0 09/18/2011   Lab Results  Component Value Date   CHOLHDL 4 01/08/2014   CHOLHDL 5 03/18/2012   CHOLHDL 3 09/18/2011   Lab Results  Component Value Date   LDLDIRECT 162.4 03/18/2012   LDLDIRECT 147.6 03/16/2010   stable and well controlled   Osteopenia D level is 51 Last dexa 08   Saw gyn in May - Dr Kennon Rounds - mentioned that she no longer  needed pap  Hx of endomet ca in the past   Hyperglycemia Lab Results  Component Value Date   HGBA1C 5.6 01/08/2014    Patient Active Problem List   Diagnosis Date Noted  . Encounter for Medicare annual wellness exam 01/15/2014  . Risk for falls 03/25/2012  . Thrombocytopenia 09/24/2011  . Breast cancer, stage 2 09/20/2011  . Dyspepsia 07/03/2011  . VITAMIN D DEFICIENCY 08/29/2009  . DEPENDENT EDEMA, LEGS 09/07/2008  . CONSTIPATION 07/30/2008  . HYPERGLYCEMIA 07/09/2008  . Malignant neoplasm of corpus uteri, except isthmus 10/28/2007  . HEARING LOSS, LEFT EAR 10/15/2007  . HEMORRHOIDS, INTERNAL 10/15/2007  . EXTERNAL HEMORRHOIDS 10/15/2007  . ESOPHAGEAL SPASM 10/15/2007  . DIVERTICULOSIS, COLON 10/15/2007  . HYPERLIPIDEMIA 10/07/2007  . HYPERTENSION 10/07/2007  . OSTEOPENIA 06/28/2006  . CAROTID BRUITS, BILATERAL 11/25/2005  . AORTIC STENOSIS 10/26/2004  . HIATAL HERNIA 12/27/1995   Past Medical History  Diagnosis Date  . Family history of colonic polyps   . Other abnormal glucose   . Pain in joint, shoulder region   . Unspecified hearing loss   . Aortic stenosis     s/p tissue AVR 05/2011  . Alcohol abuse, unspecified   . Dyskinesia of esophagus   . Diaphragmatic hernia without mention of obstruction or gangrene   . Tobacco use disorder   . Internal hemorrhoids without mention of complication   . Diverticulosis of colon (without mention of hemorrhage)   . Postmenopausal bleeding   . HTN (hypertension)   . HLD (hyperlipidemia)   . Malignant neoplasm of corpus uteri, except isthmus   . Breast CA     rt  . Arthritis   . Heart murmur    Past Surgical History  Procedure Laterality Date  . Rotator cuff repair  05/2008    right  . Laparoscopy w/ total bilateral pelvic and peri-aortic lymphadenectomy  11/04/2007  . Total abdominal hysterectomy w/ bilateral salpingoophorectomy  11/04/07    salpingo-oophrectomy  . Cataract extraction      bil  . Breast biopsy  5/11     DCIS  . Breast lumpectomy  11/2009    DCIS, neg sentinel lymph node biopsy  . Abdominal hysterectomy    . Aortic valve replacement  06/13/2011    Procedure: AORTIC VALVE REPLACEMENT (AVR);  Surgeon: Gaye Pollack, MD;  Location: [facility];  Service: Open Heart Surgery;  Laterality: N/A;  . Breast lumpectomy    . Shoulder surgery    .  Abdominal hysterectomy    . Paraaortic lymphadenectomy     History  Substance Use Topics  . Smoking status: Former Smoker -- 1.00 packs/day    Quit date: 03/28/2000  . Smokeless tobacco: Not on file  . Alcohol Use: No   Family History  Problem Relation Age of Onset  . Colon polyps Sister     2010  . Cancer Maternal Aunt     possible post menopausal breast cancer  . Heart disease Mother   . Stroke Father   . Heart failure Father    Allergies  Allergen Reactions  . Calcium     REACTION: constipation  . Diovan [Valsartan]     REACTION: abdominal pain  . Ezetimibe     REACTION: fatigue  . Ramipril     REACTION: abdominal pain  . Simvastatin     REACTION: leg pain and body aches   Current Outpatient Prescriptions on File Prior to Visit  Medication Sig Dispense Refill  . amLODipine (NORVASC) 5 MG tablet Take 1 tablet (5 mg total) by mouth daily.  90 tablet  3  . aspirin 325 MG tablet Take 325 mg by mouth. Take 1/2 tablet daily      . carvedilol (COREG) 25 MG tablet Take 1 tablet (25 mg total) by mouth 2 (two) times daily with a meal.  180 tablet  3  . Cholecalciferol (VITAMIN D) 1000 UNITS capsule Take 1 capsule (1,000 Units total) by mouth 2 (two) times daily.  180 capsule  3  . diphenhydrAMINE (BENADRYL) 50 MG tablet Take 50 mg by mouth every 6 (six) hours as needed.       . hydrochlorothiazide (HYDRODIURIL) 25 MG tablet TAKE 1 TABLET DAILY  90 tablet  3  . Omega-3 Fatty Acids (FISH OIL) 1000 MG CAPS Take 2,000 mg by mouth daily.       . potassium chloride SA (K-DUR,KLOR-CON) 20 MEQ tablet Take 1 tablet (20 mEq total) by mouth daily.  90 tablet   3  . Psyllium (METAMUCIL PO) Take by mouth as needed.       Marland Kitchen spironolactone (ALDACTONE) 25 MG tablet Take 1 tablet (25 mg total) by mouth daily.  90 tablet  3   No current facility-administered medications on file prior to visit.    Review of Systems    Review of Systems  Constitutional: Negative for fever, appetite change, fatigue and unexpected weight change.  Eyes: Negative for pain and visual disturbance.  Respiratory: Negative for cough and shortness of breath.   Cardiovascular: Negative for cp or palpitations    Gastrointestinal: Negative for nausea, diarrhea and constipation.  Genitourinary: Negative for urgency and frequency.  Skin: Negative for pallor or rash   Neurological: Negative for weakness, light-headedness, numbness and headaches.  Hematological: Negative for adenopathy. Does not bruise/bleed easily.  Psychiatric/Behavioral: Negative for dysphoric mood. The patient is not nervous/anxious.      Objective:   Physical Exam  Constitutional: She appears well-developed and well-nourished. No distress.  obese and well appearing   HENT:  Head: Normocephalic and atraumatic.  Right Ear: External ear normal.  Left Ear: External ear normal.  Nose: Nose normal.  Mouth/Throat: Oropharynx is clear and moist.  Eyes: Conjunctivae and EOM are normal. Pupils are equal, round, and reactive to light. Right eye exhibits no discharge. Left eye exhibits no discharge. No scleral icterus.  Neck: Normal range of motion. Neck supple. No JVD present. No thyromegaly present.  Cardiovascular: Normal rate, regular rhythm and intact distal  pulses.  Exam reveals no gallop.   Murmur heard. Pulmonary/Chest: Effort normal and breath sounds normal. No respiratory distress. She has no wheezes. She has no rales.  Abdominal: Soft. Bowel sounds are normal. She exhibits no distension and no mass. There is no tenderness.  Musculoskeletal: She exhibits no edema and no tenderness.  Lymphadenopathy:    She  has no cervical adenopathy.  Neurological: She is alert. She has normal reflexes. No cranial nerve deficit. She exhibits normal muscle tone. Coordination normal.  Skin: Skin is warm and dry. No rash noted. No erythema. No pallor.  Psychiatric: She has a normal mood and affect.          Assessment & Plan:   Problem List Items Addressed This Visit     Cardiovascular and Mediastinum   HYPERTENSION      bp in fair control at this time  BP Readings from Last 1 Encounters:  01/15/14 126/82   No changes needed Disc lifstyle change with low sodium diet and exercise  Labs rev today      Musculoskeletal and Integument   OSTEOPENIA     Scheduled dexa  Disc need for calcium/ vitamin D/ wt bearing exercise and bone density test every 2 y to monitor Disc safety/ fracture risk in detail   D level is better at 51 Refilled her vit D    Relevant Orders      DG Bone Density     Other   VITAMIN D DEFICIENCY     Refilled 1000 iu bid  Vitamin D level is therapeutic with current supplementation Disc importance of this to bone and overall health      HYPERLIPIDEMIA     Disc goals for lipids and reasons to control them Rev labs with pt Rev low sat fat diet in detail  Overall stable     HYPERGLYCEMIA      Lab Results  Component Value Date   HGBA1C 5.6 01/08/2014   Doing very well on lower sugar diet -feels good Enc exercise     Encounter for Medicare annual wellness exam - Primary     Reviewed health habits including diet and exercise and skin cancer prevention Reviewed appropriate screening tests for age  Also reviewed health mt list, fam hx and immunization status , as well as social and family history   See HPI Labs rev  Mammogram upcoming Given IFOB card  Will work on adv dir prevnar vaccine today    Colon cancer screening     IFOB given for screening Pt is over 75    Relevant Orders      Fecal occult blood, imunochemical    Other Visit Diagnoses   Need for  vaccination with 13-polyvalent pneumococcal conjugate vaccine        Relevant Orders       Pneumococcal conjugate vaccine 13-valent (Completed)

## 2014-01-15 NOTE — Assessment & Plan Note (Signed)
IFOB given for screening Pt is over 77

## 2014-01-15 NOTE — Patient Instructions (Signed)
Please do IFOB card for colon cancer screening  prevnar vaccine today (pneumonia vaccine booster) Please work on an advanced directive Get your mammogram as planned Stop at check out for referral for bone density test

## 2014-01-15 NOTE — Assessment & Plan Note (Signed)
Disc goals for lipids and reasons to control them Rev labs with pt Rev low sat fat diet in detail Overall stable   

## 2014-01-15 NOTE — Assessment & Plan Note (Signed)
Lab Results  Component Value Date   HGBA1C 5.6 01/08/2014   Doing very well on lower sugar diet -feels good Enc exercise

## 2014-01-15 NOTE — Assessment & Plan Note (Signed)
Scheduled dexa  Disc need for calcium/ vitamin D/ wt bearing exercise and bone density test every 2 y to monitor Disc safety/ fracture risk in detail   D level is better at 51 Refilled her vit D

## 2014-01-15 NOTE — Assessment & Plan Note (Signed)
Refilled 1000 iu bid  Vitamin D level is therapeutic with current supplementation Disc importance of this to bone and overall health

## 2014-01-15 NOTE — Assessment & Plan Note (Signed)
bp in fair control at this time  BP Readings from Last 1 Encounters:  01/15/14 126/82   No changes needed Disc lifstyle change with low sodium diet and exercise  Labs rev today

## 2014-01-18 ENCOUNTER — Telehealth: Payer: Self-pay | Admitting: Family Medicine

## 2014-01-18 NOTE — Telephone Encounter (Signed)
Relevant patient education assigned to patient using Emmi. ° °

## 2014-01-19 ENCOUNTER — Ambulatory Visit (INDEPENDENT_AMBULATORY_CARE_PROVIDER_SITE_OTHER)
Admission: RE | Admit: 2014-01-19 | Discharge: 2014-01-19 | Disposition: A | Discharge status: Home / Self Care | Payer: Medicare Other | Source: Ambulatory Visit | Attending: Family Medicine | Admitting: Family Medicine

## 2014-01-19 DIAGNOSIS — M949 Disorder of cartilage, unspecified: Principal | ICD-10-CM

## 2014-01-19 DIAGNOSIS — M899 Disorder of bone, unspecified: Secondary | ICD-10-CM

## 2014-01-19 LAB — HM DEXA SCAN: HM Dexa Scan: NORMAL

## 2014-01-26 ENCOUNTER — Encounter: Payer: Self-pay | Admitting: Family Medicine

## 2014-01-26 ENCOUNTER — Ambulatory Visit
Admission: RE | Admit: 2014-01-26 | Discharge: 2014-01-26 | Disposition: A | Discharge status: Home / Self Care | Payer: Medicare Other | Source: Ambulatory Visit | Attending: Family Medicine | Admitting: Family Medicine

## 2014-01-26 ENCOUNTER — Encounter: Payer: Self-pay | Admitting: *Deleted

## 2014-01-26 DIAGNOSIS — Z853 Personal history of malignant neoplasm of breast: Secondary | ICD-10-CM

## 2014-01-26 DIAGNOSIS — N6489 Other specified disorders of breast: Secondary | ICD-10-CM | POA: Diagnosis not present

## 2014-01-27 ENCOUNTER — Encounter: Payer: Self-pay | Admitting: *Deleted

## 2014-02-19 DIAGNOSIS — H52209 Unspecified astigmatism, unspecified eye: Secondary | ICD-10-CM | POA: Diagnosis not present

## 2014-02-19 DIAGNOSIS — Z961 Presence of intraocular lens: Secondary | ICD-10-CM | POA: Diagnosis not present

## 2014-02-19 DIAGNOSIS — H02839 Dermatochalasis of unspecified eye, unspecified eyelid: Secondary | ICD-10-CM | POA: Diagnosis not present

## 2014-02-20 ENCOUNTER — Telehealth: Payer: Self-pay | Admitting: Hematology and Oncology

## 2014-02-20 NOTE — Telephone Encounter (Signed)
Called home phone no vm lft msg on pt's cell phone regarding labs/ov and MD change, mailed out sch ...Marland KitchenMarland KitchenMarland Kitchen KJ

## 2014-02-23 ENCOUNTER — Ambulatory Visit (INDEPENDENT_AMBULATORY_CARE_PROVIDER_SITE_OTHER): Payer: Medicare Other

## 2014-02-23 DIAGNOSIS — Z23 Encounter for immunization: Secondary | ICD-10-CM

## 2014-03-10 DIAGNOSIS — H02834 Dermatochalasis of left upper eyelid: Secondary | ICD-10-CM | POA: Diagnosis not present

## 2014-03-10 DIAGNOSIS — H02403 Unspecified ptosis of bilateral eyelids: Secondary | ICD-10-CM | POA: Diagnosis not present

## 2014-03-10 DIAGNOSIS — H02831 Dermatochalasis of right upper eyelid: Secondary | ICD-10-CM | POA: Diagnosis not present

## 2014-03-15 ENCOUNTER — Ambulatory Visit (INDEPENDENT_AMBULATORY_CARE_PROVIDER_SITE_OTHER): Payer: Medicare Other | Admitting: Family Medicine

## 2014-03-15 ENCOUNTER — Encounter: Payer: Self-pay | Admitting: Family Medicine

## 2014-03-15 VITALS — BP 150/80 | HR 72 | Temp 98.1°F | Wt 194.8 lb

## 2014-03-15 DIAGNOSIS — R300 Dysuria: Secondary | ICD-10-CM

## 2014-03-15 DIAGNOSIS — N390 Urinary tract infection, site not specified: Secondary | ICD-10-CM | POA: Diagnosis not present

## 2014-03-15 LAB — POCT URINALYSIS DIPSTICK
BILIRUBIN UA: NEGATIVE
Glucose, UA: NEGATIVE
KETONES UA: NEGATIVE
Nitrite, UA: NEGATIVE
PROTEIN UA: NEGATIVE
Spec Grav, UA: 1.015
Urobilinogen, UA: 0.2
pH, UA: 7

## 2014-03-15 MED ORDER — SULFAMETHOXAZOLE-TMP DS 800-160 MG PO TABS: 1.0000 | ORAL_TABLET | Freq: Two times a day (BID) | ORAL | Status: DC

## 2014-03-15 NOTE — Patient Instructions (Signed)
Drink plenty of water and start the antibiotics today.  We'll contact you with your lab report.  Take care.   

## 2014-03-15 NOTE — Progress Notes (Signed)
Pre visit review using our clinic review tool, if applicable. No additional management support is needed unless otherwise documented below in the visit note.  Dysuria: yes, burning with urination. Frequency.  Blood noted in urine once.   duration of symptoms: a few days ago abdominal pain:yes  fevers:no back pain:no vomiting:no U/a d/w pt.  ucx pending.    Meds, vitals, and allergies reviewed.   ROS: See HPI.  Otherwise negative.    GEN: nad, alert and oriented HEENT: mucous membranes moist NECK: supple CV: rrr.  Murmur noted PULM: ctab, no inc wob ABD: soft, +bs, suprapubic area not tender EXT: edema in BLE at baseline.  SKIN: no acute rash BACK: no CVA pain

## 2014-03-15 NOTE — Assessment & Plan Note (Signed)
Nontoxic, d/w pt.  Septra, fluids, ucx, f/u prn.  She agrees.

## 2014-03-16 ENCOUNTER — Ambulatory Visit (INDEPENDENT_AMBULATORY_CARE_PROVIDER_SITE_OTHER): Payer: Medicare Other | Admitting: General Surgery

## 2014-03-16 DIAGNOSIS — C50911 Malignant neoplasm of unspecified site of right female breast: Secondary | ICD-10-CM | POA: Diagnosis not present

## 2014-03-18 LAB — URINE CULTURE: Colony Count: >=100000

## 2014-03-29 ENCOUNTER — Encounter: Payer: Self-pay | Admitting: Family Medicine

## 2014-06-17 ENCOUNTER — Other Ambulatory Visit: Payer: Self-pay | Admitting: *Deleted

## 2014-06-17 DIAGNOSIS — C50919 Malignant neoplasm of unspecified site of unspecified female breast: Secondary | ICD-10-CM

## 2014-06-21 ENCOUNTER — Other Ambulatory Visit (HOSPITAL_BASED_OUTPATIENT_CLINIC_OR_DEPARTMENT_OTHER): Payer: Medicare Other

## 2014-06-21 ENCOUNTER — Telehealth: Payer: Self-pay | Admitting: Hematology and Oncology

## 2014-06-21 ENCOUNTER — Ambulatory Visit (HOSPITAL_BASED_OUTPATIENT_CLINIC_OR_DEPARTMENT_OTHER): Payer: Medicare Other | Admitting: Hematology and Oncology

## 2014-06-21 VITALS — BP 135/58 | HR 69 | Temp 98.3°F | Resp 18 | Ht 66.0 in | Wt 197.8 lb

## 2014-06-21 DIAGNOSIS — Z8542 Personal history of malignant neoplasm of other parts of uterus: Secondary | ICD-10-CM | POA: Diagnosis not present

## 2014-06-21 DIAGNOSIS — C50911 Malignant neoplasm of unspecified site of right female breast: Secondary | ICD-10-CM

## 2014-06-21 DIAGNOSIS — Z853 Personal history of malignant neoplasm of breast: Secondary | ICD-10-CM | POA: Diagnosis not present

## 2014-06-21 DIAGNOSIS — C50919 Malignant neoplasm of unspecified site of unspecified female breast: Secondary | ICD-10-CM

## 2014-06-21 LAB — COMPREHENSIVE METABOLIC PANEL (CC13)
ALT: 34 U/L (ref 0–55)
ANION GAP: 10 meq/L (ref 3–11)
AST: 25 U/L (ref 5–34)
Albumin: 4 g/dL (ref 3.5–5.0)
Alkaline Phosphatase: 51 U/L (ref 40–150)
BILIRUBIN TOTAL: 0.72 mg/dL (ref 0.20–1.20)
BUN: 13.8 mg/dL (ref 7.0–26.0)
CHLORIDE: 102 meq/L (ref 98–109)
CO2: 29 mEq/L (ref 22–29)
Calcium: 9.7 mg/dL (ref 8.4–10.4)
Creatinine: 0.8 mg/dL (ref 0.6–1.1)
EGFR: 67 mL/min/{1.73_m2} — ABNORMAL LOW (ref >90)
Glucose: 109 mg/dl (ref 70–140)
Potassium: 4.3 mEq/L (ref 3.5–5.1)
SODIUM: 141 meq/L (ref 136–145)
Total Protein: 6.7 g/dL (ref 6.4–8.3)

## 2014-06-21 LAB — CBC WITH DIFFERENTIAL/PLATELET
BASO%: 0.8 % (ref 0.0–2.0)
Basophils Absolute: 0 10*3/uL (ref 0.0–0.1)
EOS%: 3.9 % (ref 0.0–7.0)
Eosinophils Absolute: 0.2 10*3/uL (ref 0.0–0.5)
HCT: 45.3 % (ref 34.8–46.6)
HGB: 14.9 g/dL (ref 11.6–15.9)
LYMPH#: 1.2 10*3/uL (ref 0.9–3.3)
LYMPH%: 20.5 % (ref 14.0–49.7)
MCH: 29.2 pg (ref 25.1–34.0)
MCHC: 32.8 g/dL (ref 31.5–36.0)
MCV: 89 fL (ref 79.5–101.0)
MONO#: 0.4 10*3/uL (ref 0.1–0.9)
MONO%: 7.6 % (ref 0.0–14.0)
NEUT#: 3.9 10*3/uL (ref 1.5–6.5)
NEUT%: 67.2 % (ref 38.4–76.8)
Platelets: 158 10*3/uL (ref 145–400)
RBC: 5.1 10*6/uL (ref 3.70–5.45)
RDW: 13.4 % (ref 11.2–14.5)
WBC: 5.8 10*3/uL (ref 3.9–10.3)

## 2014-06-21 NOTE — Telephone Encounter (Signed)
, °

## 2014-06-21 NOTE — Assessment & Plan Note (Addendum)
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 Did not go on antiestrogen therapy by preference. Currently on observation  Breast cancer surveillance: 1. Mammogram done 01/26/2014 is normal 2. Physical examination of breasts 06/21/2014 is normal  Prior history of endometrioid carcinoma diagnosed in 2009 status post TAH and BSO: No problems from that.  I recommended continued annual surveillance physical exam and mammogram and follow-up. Return to clinic in 1 year

## 2014-06-21 NOTE — Progress Notes (Signed)
Patient Care Team: Abner Greenspan, MD as PCP - General Blane Ohara, MD (Cardiology) Gaye Pollack, MD (Cardiothoracic Surgery)  DIAGNOSIS: No matching staging information was found for the patient.  SUMMARY OF ONCOLOGIC HISTORY:   Cancer of right breast   12/02/2009 Surgery Right breast lumpectomy: DCIS grade 2, sentinel node negative, ER 100%, PR 89%   12/05/2009 -  Radiation Therapy MammoSite radiation    Anti-estrogen oral therapy Patient refused antiestrogen therapy    CHIEF COMPLIANT: Follow-up of history of DCIS  INTERVAL HISTORY: [name] is a 81 year old lady with above-mentioned history of right-sided DCIS treated with lumpectomy followed by MammoSite radiation and is currently on observation. She had refused antiestrogen therapy has been doing quite well. She had a previous history of endometrial cancer in 2009 treated with abdominal hysterectomy and bilateral salpingo-oophorectomy and lymph node dissection. She is doing very well from both of these problems and appears to have no problems. the area where she had the breast cancer has now formed into a big lump from the effects of mammosite radiation  REVIEW OF SYSTEMS:   Constitutional: Denies fevers, chills or abnormal weight loss Eyes: Denies blurriness of vision Ears, nose, mouth, throat, and face: Denies mucositis or sore throat Respiratory: Denies cough, dyspnea or wheezes Cardiovascular: Denies palpitation, chest discomfort. Positive for chronic lower extremity swelling Gastrointestinal:  Denies nausea, heartburn or change in bowel habits Skin: Denies abnormal skin rashes Lymphatics: Denies new lymphadenopathy or easy bruising Neurological:Denies numbness, tingling or new weaknesses Behavioral/Psych: Mood is stable, no new changes  Breast:  denies any pain or lumps or nodules in either breasts, prior MammoSite radiation area is a lump which is normal All other systems were reviewed with the patient and are  negative.  I have reviewed the past medical history, past surgical history, social history and family history with the patient and they are unchanged from previous note.  ALLERGIES:  is allergic to calcium; diovan; ezetimibe; ramipril; and simvastatin.  MEDICATIONS:  Current Outpatient Prescriptions  Medication Sig Dispense Refill  . amLODipine (NORVASC) 5 MG tablet Take 1 tablet (5 mg total) by mouth daily. 90 tablet 3  . aspirin 325 MG tablet Take 325 mg by mouth. Take 1/2 tablet daily    . carvedilol (COREG) 25 MG tablet Take 1 tablet (25 mg total) by mouth 2 (two) times daily with a meal. 180 tablet 3  . Cholecalciferol (VITAMIN D) 1000 UNITS capsule Take 1 capsule (1,000 Units total) by mouth 2 (two) times daily. 180 capsule 3  . diphenhydrAMINE (BENADRYL) 50 MG tablet Take 50 mg by mouth every 6 (six) hours as needed.     . hydrochlorothiazide (HYDRODIURIL) 25 MG tablet TAKE 1 TABLET DAILY 90 tablet 3  . hydrocortisone (PROCTOSOL HC) 2.5 % rectal cream Place 1 application rectally 2 (two) times daily. As needed 30 g 0  . Omega-3 Fatty Acids (FISH OIL) 1000 MG CAPS Take 2,000 mg by mouth daily.     . potassium chloride SA (K-DUR,KLOR-CON) 20 MEQ tablet Take 1 tablet (20 mEq total) by mouth daily. 90 tablet 3  . Psyllium (METAMUCIL PO) Take by mouth as needed.     Marland Kitchen spironolactone (ALDACTONE) 25 MG tablet Take 1 tablet (25 mg total) by mouth daily. 90 tablet 3   No current facility-administered medications for this visit.    PHYSICAL EXAMINATION: ECOG PERFORMANCE STATUS: 1 - Symptomatic but completely ambulatory  Filed Vitals:   06/21/14 1029  BP: 135/58  Pulse:  69  Temp: 98.3 F (36.8 C)  Resp: 18   Filed Weights   06/21/14 1029  Weight: 197 lb 12.8 oz (89.721 kg)    GENERAL:alert, no distress and comfortable SKIN: skin color, texture, turgor are normal, no rashes or significant lesions EYES: normal, Conjunctiva are pink and non-injected, sclera clear OROPHARYNX:no  exudate, no erythema and lips, buccal mucosa, and tongue normal  NECK: supple, thyroid normal size, non-tender, without nodularity LYMPH:  no palpable lymphadenopathy in the cervical, axillary or inguinal LUNGS: clear to auscultation and percussion with normal breathing effort HEART: regular rate & rhythm and no murmurs and no lower extremity edema ABDOMEN:abdomen soft, non-tender and normal bowel sounds Musculoskeletal:no cyanosis of digits and no clubbing  NEURO: alert & oriented x 3 with fluent speech, no focal motor/sensory deficits BREAST: No palpable masses or nodules in either right or left breasts. Right breast where she had MammoSite is a rounded nodular abnormality which is unchanged from before. No palpable axillary supraclavicular or infraclavicular adenopathy no breast tenderness or nipple discharge. (exam performed in the presence of a chaperone)  LABORATORY DATA:  I have reviewed the data as listed   Chemistry      Component Value Date/Time   NA 135 01/08/2014 0840   NA 142 06/19/2013 0926   K 4.2 01/08/2014 0840   K 3.7 06/19/2013 0926   CL 104 01/08/2014 0840   CO2 27 01/08/2014 0840   CO2 31* 06/19/2013 0926   BUN 13 01/08/2014 0840   BUN 11.8 06/19/2013 0926   CREATININE 0.8 01/08/2014 0840   CREATININE 0.8 06/19/2013 0926      Component Value Date/Time   CALCIUM 9.9 01/08/2014 0840   CALCIUM 10.2 06/19/2013 0926   ALKPHOS 42 01/08/2014 0840   ALKPHOS 48 06/19/2013 0926   AST 27 01/08/2014 0840   AST 26 06/19/2013 0926   ALT 35 01/08/2014 0840   ALT 38 06/19/2013 0926   BILITOT 0.9 01/08/2014 0840   BILITOT 0.55 06/19/2013 0926       Lab Results  Component Value Date   WBC 5.8 06/21/2014   HGB 14.9 06/21/2014   HCT 45.3 06/21/2014   MCV 89.0 06/21/2014   PLT 158 06/21/2014   NEUTROABS 3.9 06/21/2014     RADIOGRAPHIC STUDIES: I have personally reviewed the radiology reports and agreed with their findings. 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 Did not go on antiestrogen therapy by preference. Currently on observation  Breast cancer surveillance: 1. Mammogram done 01/26/2014 is normal 2. Physical examination of breasts 06/21/2014 is normal  Prior history of endometrioid carcinoma diagnosed in 2009 status post TAH and BSO: No problems from that.  I recommended continued annual surveillance physical exam and mammogram and follow-up. Return to clinic in 1 year    Orders Placed This Encounter  Procedures  . CBC with Differential    Standing Status: Future     Number of Occurrences:      Standing Expiration Date: 06/21/2015  . Comprehensive metabolic panel (Cmet) - [facility]    Standing Status: Future     Number of Occurrences:      Standing Expiration Date: 06/21/2015   The patient has a good understanding of the overall plan. she agrees with it. She will call with any problems that may develop before her next visit here.   Rulon Eisenmenger, MD

## 2014-07-02 ENCOUNTER — Other Ambulatory Visit: Payer: Self-pay | Admitting: Cardiovascular Disease

## 2014-08-22 ENCOUNTER — Other Ambulatory Visit: Payer: Self-pay | Admitting: Cardiovascular Disease

## 2014-10-16 ENCOUNTER — Other Ambulatory Visit: Payer: Self-pay | Admitting: Cardiovascular Disease

## 2014-12-23 ENCOUNTER — Encounter: Payer: Self-pay | Admitting: Gastroenterology

## 2015-01-03 ENCOUNTER — Other Ambulatory Visit (HOSPITAL_COMMUNITY): Payer: Medicare Other

## 2015-01-17 ENCOUNTER — Other Ambulatory Visit: Payer: Self-pay | Admitting: Family Medicine

## 2015-01-17 DIAGNOSIS — Z853 Personal history of malignant neoplasm of breast: Secondary | ICD-10-CM

## 2015-02-01 ENCOUNTER — Ambulatory Visit
Admission: RE | Admit: 2015-02-01 | Discharge: 2015-02-01 | Disposition: A | Discharge status: Home / Self Care | Payer: Medicare Other | Source: Ambulatory Visit | Attending: Family Medicine | Admitting: Family Medicine

## 2015-02-01 DIAGNOSIS — R928 Other abnormal and inconclusive findings on diagnostic imaging of breast: Secondary | ICD-10-CM | POA: Diagnosis not present

## 2015-02-01 DIAGNOSIS — Z853 Personal history of malignant neoplasm of breast: Secondary | ICD-10-CM

## 2015-02-01 LAB — HM MAMMOGRAPHY: HM MAMMO: NORMAL

## 2015-02-02 ENCOUNTER — Encounter: Payer: Self-pay | Admitting: *Deleted

## 2015-02-02 ENCOUNTER — Encounter: Payer: Self-pay | Admitting: Family Medicine

## 2015-02-07 ENCOUNTER — Other Ambulatory Visit: Payer: Self-pay | Admitting: Cardiovascular Disease

## 2015-02-07 DIAGNOSIS — I350 Nonrheumatic aortic (valve) stenosis: Secondary | ICD-10-CM

## 2015-02-10 ENCOUNTER — Other Ambulatory Visit: Payer: Self-pay

## 2015-02-10 ENCOUNTER — Ambulatory Visit (HOSPITAL_COMMUNITY): Payer: Medicare Other | Attending: Cardiology

## 2015-02-10 DIAGNOSIS — I340 Nonrheumatic mitral (valve) insufficiency: Secondary | ICD-10-CM | POA: Diagnosis not present

## 2015-02-10 DIAGNOSIS — I10 Essential (primary) hypertension: Secondary | ICD-10-CM | POA: Insufficient documentation

## 2015-02-10 DIAGNOSIS — E785 Hyperlipidemia, unspecified: Secondary | ICD-10-CM | POA: Insufficient documentation

## 2015-02-10 DIAGNOSIS — Z953 Presence of xenogenic heart valve: Secondary | ICD-10-CM | POA: Insufficient documentation

## 2015-02-10 DIAGNOSIS — I350 Nonrheumatic aortic (valve) stenosis: Secondary | ICD-10-CM | POA: Diagnosis not present

## 2015-02-10 DIAGNOSIS — F17200 Nicotine dependence, unspecified, uncomplicated: Secondary | ICD-10-CM | POA: Diagnosis not present

## 2015-02-10 DIAGNOSIS — I313 Pericardial effusion (noninflammatory): Secondary | ICD-10-CM | POA: Diagnosis not present

## 2015-02-17 ENCOUNTER — Encounter: Payer: Self-pay | Admitting: Cardiovascular Disease

## 2015-02-17 ENCOUNTER — Ambulatory Visit (INDEPENDENT_AMBULATORY_CARE_PROVIDER_SITE_OTHER): Payer: Medicare Other | Admitting: Cardiovascular Disease

## 2015-02-17 VITALS — BP 124/82 | HR 63 | Ht 66.0 in | Wt 201.4 lb

## 2015-02-17 DIAGNOSIS — I350 Nonrheumatic aortic (valve) stenosis: Secondary | ICD-10-CM

## 2015-02-17 NOTE — Patient Instructions (Signed)
Medication Instructions:  Your physician recommends that you continue on your current medications as directed. Please refer to the Current Medication list given to you today.    Labwork: NONE ORDER TODAY    Testing/Procedures: NONE ORDER TODAY    Follow-Up:  Your physician wants you to follow-up in: ONE YEAR WITH DR COOPER  You will receive a reminder letter in the mail two months in advance. If you don't receive a letter, please call our office to schedule the follow-up appointment.    Any Other Special Instructions Will Be Listed Below (If Applicable).           

## 2015-02-17 NOTE — Progress Notes (Signed)
Cardiology Office Note Date:  02/17/2015   ID:  [name], [name] [birth year], MRN [id number]  PCP:  Loura Pardon, MD  Cardiologist:  Sherren Mocha, MD    Chief Complaint  Patient presents with  . Fatigue    History of Present Illness: [name] is a 81 y.o. female who presents for  Follow-up of aortic stenosis. The patient underwent bioprosthetic aortic valve replacement in 2013 for treatment of severe symptomatic aortic stenosis. She has also been followed for hypertension.   in 2015 the patient's echocardiogram showed mildly elevated transaortic valve gradients. The study was repeated last week and demonstrated stability. LV function has been normal.  Has been having problems with right leg pain the last several months and her mobility has been limited, now slowly improving. Her husband has been having a lot of health problems and this has been difficult for her. She hasn't been following a good diet, but continues to go to [employer] for exercise on a sporadic basis.   She denies chest pain or shortness of breath. She does have occasional twinges in her chest.    Past Medical History  Diagnosis Date  . Family history of colonic polyps   . Other abnormal glucose   . Pain in joint, shoulder region   . Unspecified hearing loss   . Aortic stenosis     s/p tissue AVR 05/2011  . Alcohol abuse, unspecified   . Dyskinesia of esophagus   . Diaphragmatic hernia without mention of obstruction or gangrene   . Tobacco use disorder   . Internal hemorrhoids without mention of complication   . Diverticulosis of colon (without mention of hemorrhage)   . Postmenopausal bleeding   . HTN (hypertension)   . HLD (hyperlipidemia)   . Malignant neoplasm of corpus uteri, except isthmus   . Breast CA     rt  . Arthritis   . Heart murmur     Past Surgical History  Procedure Laterality Date  . Rotator cuff repair  05/2008    right  . Laparoscopy w/ total bilateral pelvic and peri-aortic  lymphadenectomy  11/04/2007  . Total abdominal hysterectomy w/ bilateral salpingoophorectomy  11/04/07    salpingo-oophrectomy  . Cataract extraction      bil  . Breast biopsy  5/11    DCIS  . Breast lumpectomy  11/2009    DCIS, neg sentinel lymph node biopsy  . Abdominal hysterectomy    . Aortic valve replacement  06/13/2011    Procedure: AORTIC VALVE REPLACEMENT (AVR);  Surgeon: Gaye Pollack, MD;  Location: [facility];  Service: Open Heart Surgery;  Laterality: N/A;  . Breast lumpectomy    . Shoulder surgery    . Abdominal hysterectomy    . Paraaortic lymphadenectomy      Current Outpatient Prescriptions  Medication Sig Dispense Refill  . amLODipine (NORVASC) 5 MG tablet TAKE 1 TABLET (5 MG TOTAL) BY MOUTH DAILY. 90 tablet 3  . aspirin 325 MG tablet Take 325 mg by mouth. Take 1/2 tablet daily    . carvedilol (COREG) 25 MG tablet TAKE 1 TABLET TWICE DAILY WITH FOOD 180 tablet 3  . Cholecalciferol (VITAMIN D) 1000 UNITS capsule Take 1 capsule (1,000 Units total) by mouth 2 (two) times daily. 180 capsule 3  . diphenhydrAMINE (BENADRYL) 50 MG tablet Take 50 mg by mouth every 6 (six) hours as needed.     . hydrochlorothiazide (HYDRODIURIL) 25 MG tablet TAKE 1 TABLET DAILY 90 tablet 3  .  hydrocortisone (PROCTOSOL HC) 2.5 % rectal cream Place 1 application rectally 2 (two) times daily. As needed 30 g 0  . KLOR-CON M20 20 MEQ tablet TAKE 1 TABLET (20 MEQ TOTAL) BY MOUTH DAILY. 90 tablet 1  . Omega-3 Fatty Acids (FISH OIL) 1000 MG CAPS Take 2,000 mg by mouth daily.     . Psyllium (METAMUCIL PO) Take by mouth as needed.     Marland Kitchen spironolactone (ALDACTONE) 25 MG tablet TAKE 1 TABLET (25 MG TOTAL) BY MOUTH DAILY. 90 tablet 3   No current facility-administered medications for this visit.    Allergies:   Calcium; Diovan; Ezetimibe; Ramipril; and Simvastatin   Social History:  The patient  reports that she quit smoking about 14 years ago. She has never used smokeless tobacco. She reports that she does  not drink alcohol or use illicit drugs.   Family History:  The patient's family history includes Cancer in her maternal aunt; Colon polyps in her sister; Heart disease in her mother; Heart failure in her father; Stroke in her father.    ROS:  Please see the history of present illness.  Otherwise, review of systems is positive for leg pain.  All other systems are reviewed and negative.   PHYSICAL EXAM: VS:  BP 124/82 mmHg  Pulse 63  Ht 5\' 6"  (1.676 m)  Wt 201 lb 6.4 oz (91.354 kg)  BMI 32.52 kg/m2  LMP 05/29/2007 , BMI Body mass index is 32.52 kg/(m^2). GEN: Well nourished, well developed, in no acute distress HEENT: normal Neck: no JVD, no masses. No carotid bruits Cardiac: RRR with grade 2/6 systolic ejection murmur at the right upper sternal border , no diastolic murmur             Respiratory:  clear to auscultation bilaterally, normal work of breathing GI: soft, nontender, nondistended, + BS MS: no deformity or atrophy Ext: no pretibial edema, pedal pulses 2+= bilaterally Skin: warm and dry, no rash Neuro:  Strength and sensation are intact Psych: euthymic mood, full affect  EKG:  EKG is ordered today. The ekg ordered today shows NSR 63 bpm, LVH with QRS widening.  Recent Labs: 06/21/2014: ALT 34; BUN 13.8; Creatinine 0.8; HGB 14.9; Platelets 158; Potassium 4.3; Sodium 141   Lipid Panel     Component Value Date/Time   CHOL 172 01/08/2014 0840   TRIG 75.0 01/08/2014 0840   TRIG 58 05/13/2006 0933   HDL 46.70 01/08/2014 0840   CHOLHDL 4 01/08/2014 0840   CHOLHDL 2.8 CALC 05/13/2006 0933   VLDL 15.0 01/08/2014 0840   LDLCALC 110* 01/08/2014 0840   LDLDIRECT 162.4 03/18/2012 0900      Wt Readings from Last 3 Encounters:  02/17/15 201 lb 6.4 oz (91.354 kg)  06/21/14 197 lb 12.8 oz (89.721 kg)  03/15/14 194 lb 12 oz (88.338 kg)     Cardiac Studies Reviewed: 2D Echo 02/10/2015: Study Conclusions  - Left ventricle: The cavity size was normal. Systolic function  was normal. The estimated ejection fraction was in the range of 60% to 65%. Wall motion was normal; there were no regional wall motion abnormalities. Doppler parameters are consistent with abnormal left ventricular relaxation (grade 1 diastolic dysfunction). - Aortic valve: A bioprosthesis was present. Peak velocity (S): 242 cm/s. Mean gradient (S): 14 mm Hg. - Mitral valve: There was mild regurgitation. - Left atrium: The atrium was mildly dilated. - Pulmonary arteries: Systolic pressure was mildly increased. PA peak pressure: 35 mm Hg (S). - Pericardium, extracardiac: A  trivial pericardial effusion was identified along the right ventricular free wall.  Impressions:  - Compared to the prior study, there has been no significant interval change.  ASSESSMENT AND PLAN: 1.   Aortic valve disease status post bioprosthetic aortic valve replacement: Her echocardiogram is reviewed and shows no significant change from last year's study. Her transaortic valve gradients are within expected limits. The importance of SBE prophylaxis is reviewed with the patient. She consistently follows this. No medication changes are made today.  2. Essential hypertension: Blood pressure is controlled on multidrug therapy. Lifestyle modification discussed.  Current medicines are reviewed with the patient today.  The patient does not have concerns regarding medicines.  Labs/ tests ordered today include:   Orders Placed This Encounter  Procedures  . EKG 12-Lead    Disposition:   FU one year  Signed, Sherren Mocha, MD  02/17/2015 1:18 PM    [facility] [address], [place], [place]  [address] Phone: [phone]; Fax: [phone]

## 2015-03-21 DIAGNOSIS — C50911 Malignant neoplasm of unspecified site of right female breast: Secondary | ICD-10-CM | POA: Diagnosis not present

## 2015-03-24 ENCOUNTER — Telehealth: Payer: Self-pay | Admitting: Family Medicine

## 2015-03-24 DIAGNOSIS — S39012S Strain of muscle, fascia and tendon of lower back, sequela: Secondary | ICD-10-CM

## 2015-03-24 DIAGNOSIS — M999 Biomechanical lesion, unspecified: Secondary | ICD-10-CM

## 2015-03-24 DIAGNOSIS — S29019S Strain of muscle and tendon of unspecified wall of thorax, sequela: Secondary | ICD-10-CM

## 2015-03-24 DIAGNOSIS — S338XXS Sprain of other parts of lumbar spine and pelvis, sequela: Secondary | ICD-10-CM

## 2015-03-24 NOTE — Telephone Encounter (Signed)
[name] advise me that they have 6 dx codes: M99.03, M99.05, M99.02, S39.012A, S33.8XXA, [name] advise me if you are okay with putting in the xray orders then we just need to call pt directly to schedule appt

## 2015-03-24 NOTE — Telephone Encounter (Signed)
What diagnoses? (codes)

## 2015-03-24 NOTE — Telephone Encounter (Signed)
Clarise Cruz called stated dr Selinda Eon kin saw ms Fallert today  And michele wanted to do xray If they have them done there insurance will not pay She wanted to know if you would order xrays for ms Roskos so ins will pay Please advise   Lower back series ap and lateral view Ap pelvis/ right frog leg

## 2015-03-28 DIAGNOSIS — S338XXA Sprain of other parts of lumbar spine and pelvis, initial encounter: Secondary | ICD-10-CM | POA: Insufficient documentation

## 2015-03-28 DIAGNOSIS — S29019A Strain of muscle and tendon of unspecified wall of thorax, initial encounter: Secondary | ICD-10-CM | POA: Insufficient documentation

## 2015-03-28 DIAGNOSIS — S39012A Strain of muscle, fascia and tendon of lower back, initial encounter: Secondary | ICD-10-CM | POA: Insufficient documentation

## 2015-03-28 DIAGNOSIS — M999 Biomechanical lesion, unspecified: Secondary | ICD-10-CM | POA: Insufficient documentation

## 2015-03-28 NOTE — Telephone Encounter (Signed)
I ordered the xrays- let her know I used those dx and have no idea what her ins will or will not cover  When she comes-please have Terri or Aniceto Boss look at the orders to see if they look correct - the pelvis is supposed to include a R frog leg (may need to be added ) Thanks

## 2015-03-28 NOTE — Telephone Encounter (Signed)
I will see her then  

## 2015-03-28 NOTE — Telephone Encounter (Signed)
When I called to let pt know xray orders are in, she advise me she would like to see Dr. Glori Bickers before getting xrays. Pt was having extreme pain in hip/leg last week and that's when she went to the chiropractor who requested for [place] to do xray orders. Over the weekend pt developed a rash near her groin/ abd area and she is starting to wonder if she has shingles, pt declined to schedule an appt today with another provider so appt schedule with Dr. Glori Bickers tomorrow afternoon

## 2015-03-29 ENCOUNTER — Encounter: Payer: Self-pay | Admitting: Primary Care

## 2015-03-29 ENCOUNTER — Ambulatory Visit (INDEPENDENT_AMBULATORY_CARE_PROVIDER_SITE_OTHER)
Admission: RE | Admit: 2015-03-29 | Discharge: 2015-03-29 | Disposition: A | Discharge status: Home / Self Care | Payer: Medicare Other | Source: Ambulatory Visit | Attending: Family Medicine | Admitting: Family Medicine

## 2015-03-29 ENCOUNTER — Ambulatory Visit: Payer: Self-pay | Admitting: Family Medicine

## 2015-03-29 ENCOUNTER — Ambulatory Visit (INDEPENDENT_AMBULATORY_CARE_PROVIDER_SITE_OTHER): Payer: Medicare Other | Admitting: Primary Care

## 2015-03-29 VITALS — BP 126/76 | HR 66 | Temp 97.9°F | Ht 66.0 in | Wt 196.1 lb

## 2015-03-29 DIAGNOSIS — M9903 Segmental and somatic dysfunction of lumbar region: Secondary | ICD-10-CM

## 2015-03-29 DIAGNOSIS — S29012S Strain of muscle and tendon of back wall of thorax, sequela: Secondary | ICD-10-CM

## 2015-03-29 DIAGNOSIS — S338XXS Sprain of other parts of lumbar spine and pelvis, sequela: Secondary | ICD-10-CM | POA: Diagnosis not present

## 2015-03-29 DIAGNOSIS — M9902 Segmental and somatic dysfunction of thoracic region: Secondary | ICD-10-CM

## 2015-03-29 DIAGNOSIS — S338XXA Sprain of other parts of lumbar spine and pelvis, initial encounter: Secondary | ICD-10-CM | POA: Diagnosis not present

## 2015-03-29 DIAGNOSIS — H918X2 Other specified hearing loss, left ear: Secondary | ICD-10-CM | POA: Diagnosis not present

## 2015-03-29 DIAGNOSIS — M999 Biomechanical lesion, unspecified: Secondary | ICD-10-CM

## 2015-03-29 DIAGNOSIS — R21 Rash and other nonspecific skin eruption: Secondary | ICD-10-CM | POA: Diagnosis not present

## 2015-03-29 DIAGNOSIS — S39012S Strain of muscle, fascia and tendon of lower back, sequela: Secondary | ICD-10-CM | POA: Diagnosis not present

## 2015-03-29 DIAGNOSIS — M5136 Other intervertebral disc degeneration, lumbar region: Secondary | ICD-10-CM | POA: Diagnosis not present

## 2015-03-29 DIAGNOSIS — H903 Sensorineural hearing loss, bilateral: Secondary | ICD-10-CM | POA: Diagnosis not present

## 2015-03-29 DIAGNOSIS — S29019S Strain of muscle and tendon of unspecified wall of thorax, sequela: Secondary | ICD-10-CM

## 2015-03-29 DIAGNOSIS — H9313 Tinnitus, bilateral: Secondary | ICD-10-CM | POA: Diagnosis not present

## 2015-03-29 DIAGNOSIS — H9113 Presbycusis, bilateral: Secondary | ICD-10-CM | POA: Diagnosis not present

## 2015-03-29 MED ORDER — NYSTATIN 100000 UNIT/GM EX CREA: TOPICAL_CREAM | CUTANEOUS | Status: DC

## 2015-03-29 NOTE — Patient Instructions (Signed)
Complete xray(s) prior to leaving today. Dr. Glori Bickers will contact you regarding your results.  Apply Nystatin cream to rash twice daily. This is not the shingles.  It was a pleasure meeting you!

## 2015-03-29 NOTE — Progress Notes (Signed)
Subjective:    Patient ID: [name], female    DOB: Nov 04, 1933, 81 y.o.   MRN: [id number]  HPI  [name] is a 81 year old female who presents today with a chief complaint of rash. She first noticed her rash several months ago which improved after placing powder on her skin after bathing. Over the past 1-2 weeks her rash became worse. Denies itching, drainage, blisters. Occasional slight discomfort but overall no pain, and no pain over the past several days. She's applied gold bond powder and calamine lotion without improvement.  2) Leg pain: Located to the right lower extremity. Her pain will begin to her right buttocks and run down to her ankle. She's been applying cold packs with temporary relief. Her pain has been bothersome intermittently since January 2016. Her pain is worse with prolonged sitting/standing and improved with movement. Starting October 1st her pain dissipated. Her pain then returned 1 week ago and was worse than before. She visited a chiropractor last week who provided physical therapy and requested she obtain xrays from PCP office. She denies recent injury or trauma, numbness/tingling, or falls. PCP has pre-ordered xrays.  Review of Systems  Constitutional: Negative for fever and chills.  Musculoskeletal: Positive for arthralgias.       Right buttocks pain  Skin: Positive for color change and rash.  Neurological: Negative for numbness.       Past Medical History  Diagnosis Date  . Family history of colonic polyps   . Other abnormal glucose   . Pain in joint, shoulder region   . Unspecified hearing loss   . Aortic stenosis     s/p tissue AVR 05/2011  . Alcohol abuse, unspecified   . Dyskinesia of esophagus   . Diaphragmatic hernia without mention of obstruction or gangrene   . Tobacco use disorder   . Internal hemorrhoids without mention of complication   . Diverticulosis of colon (without mention of hemorrhage)   . Postmenopausal bleeding   . HTN  (hypertension)   . HLD (hyperlipidemia)   . Malignant neoplasm of corpus uteri, except isthmus ([place])   . Breast CA ([place])     rt  . Arthritis   . Heart murmur     Social History   Social History  . Marital Status: Married    Spouse Name: N/A  . Number of Children: N/A  . Years of Education: N/A   Occupational History  . Not on file.   Social History Main Topics  . Smoking status: Former Smoker -- 1.00 packs/day    Quit date: 03/28/2000  . Smokeless tobacco: Never Used  . Alcohol Use: No  . Drug Use: No  . Sexual Activity: No   Other Topics Concern  . Not on file   Social History Narrative    Past Surgical History  Procedure Laterality Date  . Rotator cuff repair  05/2008    right  . Laparoscopy w/ total bilateral pelvic and peri-aortic lymphadenectomy  11/04/2007  . Total abdominal hysterectomy w/ bilateral salpingoophorectomy  11/04/07    salpingo-oophrectomy  . Cataract extraction      bil  . Breast biopsy  5/11    DCIS  . Breast lumpectomy  11/2009    DCIS, neg sentinel lymph node biopsy  . Abdominal hysterectomy    . Aortic valve replacement  06/13/2011    Procedure: AORTIC VALVE REPLACEMENT (AVR);  Surgeon: Gaye Pollack, MD;  Location: [facility];  Service: Open Heart Surgery;  Laterality: N/A;  . Breast lumpectomy    . Shoulder surgery    . Abdominal hysterectomy    . Paraaortic lymphadenectomy      Family History  Problem Relation Age of Onset  . Colon polyps Sister     2010  . Cancer Maternal Aunt     possible post menopausal breast cancer  . Heart disease Mother   . Stroke Father   . Heart failure Father     Allergies  Allergen Reactions  . Calcium     REACTION: constipation  . Diovan [Valsartan]     REACTION: abdominal pain  . Ezetimibe     REACTION: fatigue  . Ramipril     REACTION: abdominal pain  . Simvastatin     REACTION: leg pain and body aches    Current Outpatient Prescriptions on File Prior to Visit  Medication Sig Dispense  Refill  . amLODipine (NORVASC) 5 MG tablet TAKE 1 TABLET (5 MG TOTAL) BY MOUTH DAILY. 90 tablet 3  . aspirin 325 MG tablet Take 325 mg by mouth. Take 1/2 tablet daily    . carvedilol (COREG) 25 MG tablet TAKE 1 TABLET TWICE DAILY WITH FOOD 180 tablet 3  . Cholecalciferol (VITAMIN D) 1000 UNITS capsule Take 1 capsule (1,000 Units total) by mouth 2 (two) times daily. 180 capsule 3  . diphenhydrAMINE (BENADRYL) 50 MG tablet Take 50 mg by mouth every 6 (six) hours as needed.     . hydrochlorothiazide (HYDRODIURIL) 25 MG tablet TAKE 1 TABLET DAILY 90 tablet 3  . hydrocortisone (PROCTOSOL HC) 2.5 % rectal cream Place 1 application rectally 2 (two) times daily. As needed 30 g 0  . KLOR-CON M20 20 MEQ tablet TAKE 1 TABLET (20 MEQ TOTAL) BY MOUTH DAILY. 90 tablet 1  . Omega-3 Fatty Acids (FISH OIL) 1000 MG CAPS Take 2,000 mg by mouth daily.     . Psyllium (METAMUCIL PO) Take by mouth as needed.     Marland Kitchen spironolactone (ALDACTONE) 25 MG tablet TAKE 1 TABLET (25 MG TOTAL) BY MOUTH DAILY. 90 tablet 3   No current facility-administered medications on file prior to visit.    BP 126/76 mmHg  Pulse 66  Temp(Src) 97.9 F (36.6 C) (Oral)  Ht 5\' 6"  (1.676 m)  Wt 196 lb 1.9 oz (88.959 kg)  BMI 31.67 kg/m2  SpO2 96%  LMP 05/29/2007    Objective:   Physical Exam  Constitutional: She appears well-nourished.  Cardiovascular: Normal rate and regular rhythm.   Pulmonary/Chest: Effort normal and breath sounds normal.  Musculoskeletal:       Lumbar back: She exhibits normal range of motion and no tenderness.  Negative straight leg raise. Pain upon PROM to right lower extremity.   Skin: Skin is warm and dry.  Moderate yeast-like appearing rash under right abdominal fold. Redness. Non-tender, no skin breakdown.          Assessment & Plan:  Lower extremity pain:  Originates to right buttocks with radiation of pain down right lower extremity without radiculopathy. Exam with negative straight leg raise,  but pain with PROM. Xrays pre-ordered by PCP and are pending.  Discussed supportive treatment at this time.  Yeast Rash:  Present under right abdominal fold. Moderate erythema without skin breakdown. No improvement with OTC powders. RX for Nystatin cream BID. Discussed good hygiene techniques and to follow up if no improvement.

## 2015-04-04 DIAGNOSIS — M545 Low back pain: Secondary | ICD-10-CM | POA: Diagnosis not present

## 2015-04-04 DIAGNOSIS — M4806 Spinal stenosis, lumbar region: Secondary | ICD-10-CM | POA: Diagnosis not present

## 2015-04-15 DIAGNOSIS — M545 Low back pain: Secondary | ICD-10-CM | POA: Diagnosis not present

## 2015-04-20 ENCOUNTER — Other Ambulatory Visit: Payer: Self-pay | Admitting: Cardiovascular Disease

## 2015-04-25 DIAGNOSIS — M4806 Spinal stenosis, lumbar region: Secondary | ICD-10-CM | POA: Diagnosis not present

## 2015-06-22 ENCOUNTER — Other Ambulatory Visit: Payer: Self-pay

## 2015-06-22 DIAGNOSIS — C549 Malignant neoplasm of corpus uteri, unspecified: Secondary | ICD-10-CM

## 2015-06-23 ENCOUNTER — Other Ambulatory Visit (HOSPITAL_BASED_OUTPATIENT_CLINIC_OR_DEPARTMENT_OTHER): Payer: Medicare Other

## 2015-06-23 ENCOUNTER — Encounter: Payer: Self-pay | Admitting: Hematology and Oncology

## 2015-06-23 ENCOUNTER — Ambulatory Visit (HOSPITAL_BASED_OUTPATIENT_CLINIC_OR_DEPARTMENT_OTHER): Payer: Medicare Other | Admitting: Hematology and Oncology

## 2015-06-23 VITALS — BP 144/57 | HR 69 | Temp 98.3°F | Resp 18 | Ht 66.0 in | Wt 204.6 lb

## 2015-06-23 DIAGNOSIS — C50511 Malignant neoplasm of lower-outer quadrant of right female breast: Secondary | ICD-10-CM

## 2015-06-23 DIAGNOSIS — Z853 Personal history of malignant neoplasm of breast: Secondary | ICD-10-CM

## 2015-06-23 DIAGNOSIS — Z8542 Personal history of malignant neoplasm of other parts of uterus: Secondary | ICD-10-CM

## 2015-06-23 DIAGNOSIS — C549 Malignant neoplasm of corpus uteri, unspecified: Secondary | ICD-10-CM

## 2015-06-23 LAB — CBC WITH DIFFERENTIAL/PLATELET
BASO%: 0.5 % (ref 0.0–2.0)
Basophils Absolute: 0 10*3/uL (ref 0.0–0.1)
EOS%: 5.5 % (ref 0.0–7.0)
Eosinophils Absolute: 0.3 10*3/uL (ref 0.0–0.5)
HCT: 44 % (ref 34.8–46.6)
HGB: 15.2 g/dL (ref 11.6–15.9)
LYMPH#: 1.1 10*3/uL (ref 0.9–3.3)
LYMPH%: 17.8 % (ref 14.0–49.7)
MCH: 30.4 pg (ref 25.1–34.0)
MCHC: 34.5 g/dL (ref 31.5–36.0)
MCV: 88 fL (ref 79.5–101.0)
MONO#: 0.6 10*3/uL (ref 0.1–0.9)
MONO%: 10 % (ref 0.0–14.0)
NEUT%: 66.2 % (ref 38.4–76.8)
NEUTROS ABS: 4.1 10*3/uL (ref 1.5–6.5)
Platelets: 134 10*3/uL — ABNORMAL LOW (ref 145–400)
RBC: 5 10*6/uL (ref 3.70–5.45)
RDW: 13 % (ref 11.2–14.5)
WBC: 6.2 10*3/uL (ref 3.9–10.3)

## 2015-06-23 LAB — COMPREHENSIVE METABOLIC PANEL
ALT: 37 U/L (ref 0–55)
AST: 28 U/L (ref 5–34)
Albumin: 3.9 g/dL (ref 3.5–5.0)
Alkaline Phosphatase: 52 U/L (ref 40–150)
Anion Gap: 7 mEq/L (ref 3–11)
BILIRUBIN TOTAL: 0.65 mg/dL (ref 0.20–1.20)
BUN: 16.4 mg/dL (ref 7.0–26.0)
CHLORIDE: 101 meq/L (ref 98–109)
CO2: 31 meq/L — AB (ref 22–29)
CREATININE: 0.8 mg/dL (ref 0.6–1.1)
Calcium: 10.2 mg/dL (ref 8.4–10.4)
EGFR: 68 mL/min/{1.73_m2} — ABNORMAL LOW (ref >90)
GLUCOSE: 100 mg/dL (ref 70–140)
Potassium: 4.1 mEq/L (ref 3.5–5.1)
Sodium: 139 mEq/L (ref 136–145)
TOTAL PROTEIN: 6.9 g/dL (ref 6.4–8.3)

## 2015-06-23 NOTE — Progress Notes (Signed)
Unable to get in to room prior to MD.  No assessment performed.

## 2015-06-23 NOTE — Progress Notes (Signed)
Patient Care Team: Abner Greenspan, MD as PCP - General Sherren Mocha, MD (Cardiology) Gaye Pollack, MD (Cardiothoracic Surgery)  SUMMARY OF ONCOLOGIC HISTORY:   Breast cancer of lower-outer quadrant of right female breast [facility])   12/02/2009 Surgery Right breast lumpectomy: DCIS grade 2, sentinel node negative, ER 100%, PR 89%   12/05/2009 -  Radiation Therapy MammoSite radiation    Anti-estrogen oral therapy Patient refused antiestrogen therapy    CHIEF COMPLIANT: surveillance of right breast DCIS  INTERVAL HISTORY: [name] is a 82 year old with above-mentioned history of right breast DCIS who is now 5-1/2 years from original diagnosis. She is doing quite well with her general health. She had a heart valve replacement surgery and continues to take multiple heart medications. She complains of lower extremity swelling.she denies any lumps or nodules in the breast.  REVIEW OF SYSTEMS:   Constitutional: Denies fevers, chills or abnormal weight loss Eyes: Denies blurriness of vision Ears, nose, mouth, throat, and face: Denies mucositis or sore throat Respiratory: Denies cough, dyspnea or wheezes Cardiovascular: Denies palpitation, chest discomfort Gastrointestinal:  Denies nausea, heartburn or change in bowel habits Skin: Denies abnormal skin rashes Lymphatics: Denies new lymphadenopathy or easy bruising Neurological:Denies numbness, tingling or new weaknesses Behavioral/Psych: Mood is stable, no new changes  Extremities: lower extremity swelling Breast:  denies any pain or lumps or nodules in either breasts All other systems were reviewed with the patient and are negative.  I have reviewed the past medical history, past surgical history, social history and family history with the patient and they are unchanged from previous note.  ALLERGIES:  is allergic to calcium; diovan; ezetimibe; ramipril; and simvastatin.  MEDICATIONS:  Current Outpatient Prescriptions  Medication Sig  Dispense Refill  . amLODipine (NORVASC) 5 MG tablet TAKE 1 TABLET (5 MG TOTAL) BY MOUTH DAILY. 90 tablet 3  . aspirin 325 MG tablet Take 325 mg by mouth. Take 1/2 tablet daily    . carvedilol (COREG) 25 MG tablet TAKE 1 TABLET TWICE DAILY WITH FOOD 180 tablet 3  . Cholecalciferol (VITAMIN D) 1000 UNITS capsule Take 1 capsule (1,000 Units total) by mouth 2 (two) times daily. 180 capsule 3  . diphenhydrAMINE (BENADRYL) 50 MG tablet Take 50 mg by mouth every 6 (six) hours as needed.     . hydrochlorothiazide (HYDRODIURIL) 25 MG tablet TAKE 1 TABLET DAILY 90 tablet 3  . hydrocortisone (PROCTOSOL HC) 2.5 % rectal cream Place 1 application rectally 2 (two) times daily. As needed 30 g 0  . KLOR-CON M20 20 MEQ tablet TAKE 1 TABLET (20 MEQ TOTAL) BY MOUTH DAILY. 90 tablet 3  . nystatin cream (MYCOSTATIN) Apply to affected area twice daily. 30 g 0  . Omega-3 Fatty Acids (FISH OIL) 1000 MG CAPS Take 2,000 mg by mouth daily.     . Psyllium (METAMUCIL PO) Take by mouth as needed.     Marland Kitchen spironolactone (ALDACTONE) 25 MG tablet TAKE 1 TABLET (25 MG TOTAL) BY MOUTH DAILY. 90 tablet 3   No current facility-administered medications for this visit.    PHYSICAL EXAMINATION: ECOG PERFORMANCE STATUS: 1 - Symptomatic but completely ambulatory  Filed Vitals:   06/23/15 1036  BP: 144/57  Pulse: 69  Temp: 98.3 F (36.8 C)  Resp: 18   Filed Weights   06/23/15 1036  Weight: 204 lb 9.6 oz (92.806 kg)    GENERAL:alert, no distress and comfortable SKIN: skin color, texture, turgor are normal, no rashes or significant lesions EYES: normal,  Conjunctiva are pink and non-injected, sclera clear OROPHARYNX:no exudate, no erythema and lips, buccal mucosa, and tongue normal  NECK: supple, thyroid normal size, non-tender, without nodularity LYMPH:  no palpable lymphadenopathy in the cervical, axillary or inguinal LUNGS: clear to auscultation and percussion with normal breathing effort HEART: aortic ejection systolic  murmur and aortic click ABDOMEN:abdomen soft, non-tender and normal bowel sounds MUSCULOSKELETAL:no cyanosis of digits and no clubbing  NEURO: alert & oriented x 3 with fluent speech, no focal motor/sensory deficits EXTREMITIES: 2+ lower extremity edema  LABORATORY DATA:  I have reviewed the data as listed   Chemistry      Component Value Date/Time   NA 141 06/21/2014 1014   NA 135 01/08/2014 0840   K 4.3 06/21/2014 1014   K 4.2 01/08/2014 0840   CL 104 01/08/2014 0840   CO2 29 06/21/2014 1014   CO2 27 01/08/2014 0840   BUN 13.8 06/21/2014 1014   BUN 13 01/08/2014 0840   CREATININE 0.8 06/21/2014 1014   CREATININE 0.8 01/08/2014 0840      Component Value Date/Time   CALCIUM 9.7 06/21/2014 1014   CALCIUM 9.9 01/08/2014 0840   ALKPHOS 51 06/21/2014 1014   ALKPHOS 42 01/08/2014 0840   AST 25 06/21/2014 1014   AST 27 01/08/2014 0840   ALT 34 06/21/2014 1014   ALT 35 01/08/2014 0840   BILITOT 0.72 06/21/2014 1014   BILITOT 0.9 01/08/2014 0840       Lab Results  Component Value Date   WBC 6.2 06/23/2015   HGB 15.2 06/23/2015   HCT 44.0 06/23/2015   MCV 88.0 06/23/2015   PLT 134* 06/23/2015   NEUTROABS 4.1 06/23/2015     ASSESSMENT & PLAN:  Breast cancer of lower-outer quadrant of right female breast ([place]) Right breast DCIS ER 100%, PR 89% status post lumpectomy and MammoSite radiation. Did not go on antiestrogen therapy by preference. Currently on observation  Breast cancer surveillance: 1. Mammogram done 02/01/2015 is normal 2. Physical examination of breasts 06/23/2015 is normal  Prior history of endometrioid carcinoma diagnosed in 2009 status post TAH and BSO: No problems from that.  Since the patient is following with Dr. Dalbert Batman, I recommended that she continue to follow with Dr. Dalbert Batman. Alternately I suggested survivorship clinic. Patient said that she will think about it.   No orders of the defined types were placed in this encounter.   The patient has  a good understanding of the overall plan. she agrees with it. she will call with any problems that may develop before the next visit here.   Rulon Eisenmenger, MD 06/23/2015

## 2015-06-23 NOTE — Assessment & Plan Note (Signed)
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 Did not go on antiestrogen therapy by preference. Currently on observation  Breast cancer surveillance: 1. Mammogram done 02/01/2015 is normal 2. Physical examination of breasts 06/23/2015 is normal  Prior history of endometrioid carcinoma diagnosed in 2009 status post TAH and BSO: No problems from that.  I recommended continued annual surveillance physical exam and mammogram and follow-up. Return to clinic in 1 year  With survivorship clinic

## 2015-07-27 ENCOUNTER — Other Ambulatory Visit: Payer: Self-pay | Admitting: Cardiovascular Disease

## 2015-08-13 ENCOUNTER — Other Ambulatory Visit: Payer: Self-pay | Admitting: Cardiovascular Disease

## 2015-09-06 ENCOUNTER — Other Ambulatory Visit: Payer: Self-pay | Admitting: Cardiovascular Disease

## 2015-09-15 ENCOUNTER — Ambulatory Visit (INDEPENDENT_AMBULATORY_CARE_PROVIDER_SITE_OTHER): Payer: Medicare Other

## 2015-09-15 VITALS — BP 130/78 | HR 62 | Temp 98.5°F | Ht 66.0 in | Wt 203.8 lb

## 2015-09-15 DIAGNOSIS — E785 Hyperlipidemia, unspecified: Secondary | ICD-10-CM

## 2015-09-15 DIAGNOSIS — I10 Essential (primary) hypertension: Secondary | ICD-10-CM

## 2015-09-15 DIAGNOSIS — E559 Vitamin D deficiency, unspecified: Secondary | ICD-10-CM

## 2015-09-15 DIAGNOSIS — Z0000 Encounter for general adult medical examination without abnormal findings: Secondary | ICD-10-CM

## 2015-09-15 LAB — LIPID PANEL
CHOL/HDL RATIO: 4
Cholesterol: 203 mg/dL — ABNORMAL HIGH (ref 0–200)
HDL: 53.6 mg/dL (ref >39.00)
LDL CALC: 131 mg/dL — AB (ref 0–99)
NONHDL: 148.92
TRIGLYCERIDES: 91 mg/dL (ref 0.0–149.0)
VLDL: 18.2 mg/dL (ref 0.0–40.0)

## 2015-09-15 LAB — TSH: TSH: 1.39 u[IU]/mL (ref 0.35–4.50)

## 2015-09-15 LAB — VITAMIN D 25 HYDROXY (VIT D DEFICIENCY, FRACTURES): VITD: 43.01 ng/mL (ref 30.00–100.00)

## 2015-09-15 NOTE — Patient Instructions (Signed)
[name] , Thank you for taking time to come for your Medicare Wellness Visit. I appreciate your ongoing commitment to your health goals. Please review the following plan we discussed and let me know if I can assist you in the future.   These are the goals we discussed: Goals    . Weight < 181 lb (82.101 kg)     Target weight loss is 20 lbs. Starting 09/15/2015, I will continue to exercise for at least 60 min 3-4 days per week.        This is a list of the screening recommended for you and due dates:  Health Maintenance  Topic Date Due  . Pap Smear  03/28/2014  . Flu Shot  12/27/2015  . Mammogram  02/01/2016  . Tetanus Vaccine  03/09/2019  . DEXA scan (bone density measurement)  Completed  . Shingles Vaccine  Completed  . Pneumonia vaccines  Completed   Preventive Care for Adults  A healthy lifestyle and preventive care can promote health and wellness. Preventive health guidelines for adults include the following key practices.  . A routine yearly physical is a good way to check with your health care provider about your health and preventive screening. It is a chance to share any concerns and updates on your health and to receive a thorough exam.  . Visit your dentist for a routine exam and preventive care every 6 months. Brush your teeth twice a day and floss once a day. Good oral hygiene prevents tooth decay and gum disease.  . The frequency of eye exams is based on your age, health, family medical history, use  of contact lenses, and other factors. Follow your health care provider's ecommendations for frequency of eye exams.  . Eat a healthy diet. Foods like vegetables, fruits, whole grains, low-fat dairy products, and lean protein foods contain the nutrients you need without too many calories. Decrease your intake of foods high in solid fats, added sugars, and salt. Eat the right amount of calories for you. Get information about a proper diet from your health care provider, if  necessary.  . Regular physical exercise is one of the most important things you can do for your health. Most adults should get at least 150 minutes of moderate-intensity exercise (any activity that increases your heart rate and causes you to sweat) each week. In addition, most adults need muscle-strengthening exercises on 2 or more days a week.  Silver Sneakers may be a benefit available to you. To determine eligibility, you may visit the website: www.silversneakers.com or contact program at [phone]) [phone] Mon-Fri between 8AM-8PM.   . Maintain a healthy weight. The body mass index (BMI) is a screening tool to identify possible weight problems. It provides an estimate of body fat based on height and weight. Your health care provider can find your BMI and can help you achieve or maintain a healthy weight.   For adults 20 years and older: ? A BMI below 18.5 is considered underweight. ? A BMI of 18.5 to 24.9 is normal. ? A BMI of 25 to 29.9 is considered overweight. ? A BMI of 30 and above is considered obese.   . Maintain normal blood lipids and cholesterol levels by exercising and minimizing your intake of saturated fat. Eat a balanced diet with plenty of fruit and vegetables. Blood tests for lipids and cholesterol should begin at age 15 and be repeated every 5 years. If your lipid or cholesterol levels are high, you are over 50,  or you are at high risk for heart disease, you may need your cholesterol levels checked more frequently. Ongoing high lipid and cholesterol levels should be treated with medicines if diet and exercise are not working.  . If you smoke, find out from your health care provider how to quit. If you do not use tobacco, please do not start.  . If you choose to drink alcohol, please do not consume more than 2 drinks per day. One drink is considered to be 12 ounces (355 mL) of beer, 5 ounces (148 mL) of wine, or 1.5 ounces (44 mL) of liquor.  . If you are 69-2 years old, ask your  health care provider if you should take aspirin to prevent strokes.  . Use sunscreen. Apply sunscreen liberally and repeatedly throughout the day. You should seek shade when your shadow is shorter than you. Protect yourself by wearing long sleeves, pants, a wide-brimmed hat, and sunglasses year round, whenever you are outdoors.  . Once a month, do a whole body skin exam, using a mirror to look at the skin on your back. Tell your health care provider of new moles, moles that have irregular borders, moles that are larger than a pencil eraser, or moles that have changed in shape or color.

## 2015-09-15 NOTE — Progress Notes (Signed)
Patient concerns:  Patient is concerned about risk of falling due to balance and gait. Has been using husband's cane and walker to assist with ambulation as needed. No falls within previous 12 mths.   Patient has stopped taking Amlodopine due to fatigue.   Nurse concerns:  Discussed importance of considering a medical alert device due to patient being alone at times when son is away from home.

## 2015-09-15 NOTE — Progress Notes (Signed)
Subjective:   [name] is a 82 y.o. female who presents for Medicare Annual (Subsequent) preventive examination.  Cardiac Risk Factors include: advanced age (>73men, >59 women);hypertension;obesity (BMI >30kg/m2)     Objective:     Vitals: BP 130/78 mmHg  Pulse 62  Temp(Src) 98.5 F (36.9 C) (Oral)  Ht 5\' 6"  (1.676 m)  Wt 203 lb 12 oz (92.42 kg)  BMI 32.90 kg/m2  SpO2 96%  LMP 05/29/2007  Body mass index is 32.9 kg/(m^2).   Tobacco History  Smoking status  . Former Smoker -- 1.00 packs/day  . Quit date: 03/28/2000  Smokeless tobacco  . Never Used     Counseling given: No   Past Medical History  Diagnosis Date  . Family history of colonic polyps   . Other abnormal glucose   . Pain in joint, shoulder region   . Unspecified hearing loss   . Aortic stenosis     s/p tissue AVR 05/2011  . Alcohol abuse, unspecified   . Dyskinesia of esophagus   . Diaphragmatic hernia without mention of obstruction or gangrene   . Tobacco use disorder   . Internal hemorrhoids without mention of complication   . Diverticulosis of colon (without mention of hemorrhage)   . Postmenopausal bleeding   . HTN (hypertension)   . HLD (hyperlipidemia)   . Malignant neoplasm of corpus uteri, except isthmus ([place])   . Breast CA ([place])     rt  . Arthritis   . Heart murmur    Past Surgical History  Procedure Laterality Date  . Rotator cuff repair  05/2008    right  . Laparoscopy w/ total bilateral pelvic and peri-aortic lymphadenectomy  11/04/2007  . Total abdominal hysterectomy w/ bilateral salpingoophorectomy  11/04/07    salpingo-oophrectomy  . Cataract extraction      bil  . Breast biopsy  5/11    DCIS  . Breast lumpectomy  11/2009    DCIS, neg sentinel lymph node biopsy  . Abdominal hysterectomy    . Aortic valve replacement  06/13/2011    Procedure: AORTIC VALVE REPLACEMENT (AVR);  Surgeon: Gaye Pollack, MD;  Location: [facility];  Service: Open Heart Surgery;  Laterality: N/A;  .  Breast lumpectomy    . Shoulder surgery    . Abdominal hysterectomy    . Paraaortic lymphadenectomy     Family History  Problem Relation Age of Onset  . Colon polyps Sister     2010  . Cancer Maternal Aunt     possible post menopausal breast cancer  . Heart disease Mother   . Stroke Father   . Heart failure Father    History  Sexual Activity  . Sexual Activity: No    Outpatient Encounter Prescriptions as of 09/15/2015  Medication Sig  . aspirin 325 MG tablet Take 325 mg by mouth. Take 1/2 tablet daily  . carvedilol (COREG) 25 MG tablet Take 1 tablet (25 mg total) by mouth 2 (two) times daily with a meal.  . Cholecalciferol (VITAMIN D) 1000 UNITS capsule Take 1 capsule (1,000 Units total) by mouth 2 (two) times daily.  . diphenhydrAMINE (BENADRYL) 50 MG tablet Take 50 mg by mouth every 6 (six) hours as needed.   . hydrochlorothiazide (HYDRODIURIL) 25 MG tablet TAKE 1 TABLET BY MOUTH EVERY DAY  . hydrocortisone (PROCTOSOL HC) 2.5 % rectal cream Place 1 application rectally 2 (two) times daily. As needed  . KLOR-CON M20 20 MEQ tablet TAKE 1 TABLET (20 MEQ  TOTAL) BY MOUTH DAILY.  Marland Kitchen Omega-3 Fatty Acids (FISH OIL) 1000 MG CAPS Take 2,000 mg by mouth daily.   . Psyllium (METAMUCIL PO) Take by mouth as needed.   Marland Kitchen spironolactone (ALDACTONE) 25 MG tablet TAKE 1 TABLET (25 MG TOTAL) BY MOUTH DAILY.  Marland Kitchen amLODipine (NORVASC) 5 MG tablet TAKE 1 TABLET (5 MG TOTAL) BY MOUTH DAILY. (Patient not taking: Reported on 09/15/2015)   No facility-administered encounter medications on file as of 09/15/2015.    Activities of Daily Living In your present state of health, do you have any difficulty performing the following activities: 09/15/2015  Hearing? Y  Vision? N  Difficulty concentrating or making decisions? Y  Walking or climbing stairs? Y  Dressing or bathing? N  Doing errands, shopping? N  Preparing Food and eating ? N  Using the Toilet? N  In the past six months, have you accidently leaked  urine? Y  Do you have problems with loss of bowel control? N  Managing your Medications? N  Managing your Finances? N  Housekeeping or managing your Housekeeping? N    Patient Care Team: Abner Greenspan, MD as PCP - General Sherren Mocha, MD (Cardiology) Gaye Pollack, MD (Cardiothoracic Surgery) Donnamae Jude, MD as Consulting Physician (Obstetrics and Gynecology) Jodi Marble, MD as Consulting Physician (Otolaryngology) Lia Hopping, DDS as Consulting Physician (Dentistry)    Assessment:    Hearing Screening Comments: Bilateral hearing assistive devices Vision Screening Comments: Last vision exam in October 2015 with Dr. Ellie Lunch  Exercise Activities and Dietary recommendations Current Exercise Habits: Structured exercise class, Type of exercise: strength training/weights, Time (Minutes): 60, Frequency (Times/Week): 4, Weekly Exercise (Minutes/Week): 240, Intensity: Moderate, Exercise limited by: None identified  Goals    . Weight < 181 lb (82.101 kg)     Target weight loss is 20 lbs. Starting 09/15/2015, I will continue to exercise for at least 60 min 3-4 days per week.       Fall Risk Fall Risk  09/15/2015 06/21/2014 01/15/2014 03/28/2012  Falls in the past year? No No No -  Risk for fall due to : - - - Impaired balance/gait   Depression Screen PHQ 2/9 Scores 09/15/2015 01/15/2014 03/28/2012 10/15/2011  PHQ - 2 Score 0 0 0 0     Cognitive Testing MMSE - Mini Mental State Exam 09/15/2015  Orientation to time 5  Orientation to Place 5  Registration 3  Attention/ Calculation 0  Recall 3  Language- name 2 objects 0  Language- repeat 1  Language- follow 3 step command 3  Language- read & follow direction 0  Write a sentence 0  Copy design 0  Total score 20   PLEASE NOTE: A Mini-Cog screen was completed. Maximum score is 20. A value of 0 denotes this part of [place] MMSE was not completed.  Orientation to Time - Max 5 Orientation to Place - Max 5 Registration - Max  3 Recall - Max 3 Language Repeat - Max 1 Language Follow 3 Step Command - Max 3   Immunization History  Administered Date(s) Administered  . Influenza Split 03/27/2011, 03/25/2012  . Influenza Whole 03/07/2007, 02/17/2008, 03/08/2009, 03/20/2010  . Influenza,inj,Quad PF,36+ Mos 03/06/2013, 02/23/2014  . Pneumococcal Conjugate-13 01/15/2014  . Pneumococcal Polysaccharide-23 03/08/2009  . Td 05/29/1995, 03/08/2009  . Zoster 08/12/2007   Screening Tests Health Maintenance  Topic Date Due  . PAP SMEAR  09/26/2015 (Originally 03/28/2014)  . INFLUENZA VACCINE  12/27/2015  . MAMMOGRAM  02/01/2016  .  TETANUS/TDAP  03/09/2019  . DEXA SCAN  Completed  . ZOSTAVAX  Completed  . PNA vac Low Risk Adult  Completed      Plan:     I have personally reviewed and addressed the Medicare Annual Wellness questionnaire and have noted the following in the patient's chart:  A. Medical and social history B. Use of alcohol, tobacco or illicit drugs  C. Current medications and supplements D. Functional ability and status E.  Nutritional status F.  Physical activity G. Advance directives H. List of other physicians I.  Hospitalizations, surgeries, and ER visits in previous 12 months J.  [place] to include hearing, vision, cognitive, depression L. Referrals and appointments - none  In addition, I have reviewed and discussed with patient certain preventive protocols, quality metrics, and best practice recommendations. A written personalized care plan for preventive services as well as general preventive health recommendations were provided to patient.  See attached scanned questionnaire for additional information.   Signed,   Lindell Noe, MHA, BS, LPN Health Advisor [id number]

## 2015-09-15 NOTE — Progress Notes (Signed)
Pre visit review using our clinic review tool, if applicable. No additional management support is needed unless otherwise documented below in the visit note. 

## 2015-09-15 NOTE — Progress Notes (Signed)
I reviewed health advisor's note, was available for consultation, and agree with documentation and plan.  Noted and agree with removal of Amlodipine as long as BP remains below 150/90. Will send FYI to PCP.

## 2015-09-26 ENCOUNTER — Ambulatory Visit (INDEPENDENT_AMBULATORY_CARE_PROVIDER_SITE_OTHER): Payer: Medicare Other | Admitting: Family Medicine

## 2015-09-26 ENCOUNTER — Encounter: Payer: Self-pay | Admitting: Family Medicine

## 2015-09-26 VITALS — BP 116/64 | HR 67 | Temp 98.8°F | Ht 66.0 in | Wt 201.8 lb

## 2015-09-26 DIAGNOSIS — E559 Vitamin D deficiency, unspecified: Secondary | ICD-10-CM

## 2015-09-26 DIAGNOSIS — M899 Disorder of bone, unspecified: Secondary | ICD-10-CM | POA: Diagnosis not present

## 2015-09-26 DIAGNOSIS — R7309 Other abnormal glucose: Secondary | ICD-10-CM

## 2015-09-26 DIAGNOSIS — I10 Essential (primary) hypertension: Secondary | ICD-10-CM | POA: Diagnosis not present

## 2015-09-26 DIAGNOSIS — E785 Hyperlipidemia, unspecified: Secondary | ICD-10-CM

## 2015-09-26 DIAGNOSIS — M949 Disorder of cartilage, unspecified: Secondary | ICD-10-CM

## 2015-09-26 DIAGNOSIS — C549 Malignant neoplasm of corpus uteri, unspecified: Secondary | ICD-10-CM

## 2015-09-26 NOTE — Assessment & Plan Note (Signed)
Disc goals for lipids and reasons to control them Rev labs with pt Rev low sat fat diet in detail   

## 2015-09-26 NOTE — Assessment & Plan Note (Addendum)
Hx of osteopenia in the past but last dexa reviewed-in nl range No falls or fx  Urged to continue ca and D Disc need for calcium/ vitamin D/ wt bearing exercise and bone density test every 2 y to monitor Disc safety/ fracture risk in detail

## 2015-09-26 NOTE — Progress Notes (Signed)
Subjective:    Patient ID: [name], female    DOB: [birth year], 82 y.o.   MRN: [id number]  HPI Here for f/u of chronic medical problems   Had AMW with Lesia on 4/20- that went well  Wears hearing aides Wt down 2 lb with bmi of 32 Goal of wt loss/ exercise (cannot do as much as she used to -back bothers her)  Will be 82 years old in 3 weeks    colosc 3/10 with no recall -hyperplastic polyp  Flu vaccine -had it in the fall/oct   Mm 9/16 normal  Past hx breast cancer Self exam-no changes/lumps Saw the surgeon last sept/oct - did a breast exam  Would be due in Jan to see onc  No further therapy  Had hyst with BSO for endomet cancer in the past  Nl pap 2013 with neg HPV  Last saw gyn 2015- did not recommend further paps Gyn/onc signed off No gyn symptoms at all  Pt would rather skip her internal exam   dexa 8/15 normal range (though down a bit from previous) No falls or fractures  She is more cautious now -esp changing levels or walking in yard  She takes vit D - level is good at 43 No calcium-does not tolerate   bp is stable today  No cp or palpitations or headaches or edema  No side effects to medicines  BP Readings from Last 3 Encounters:  09/26/15 116/64  09/15/15 130/78  06/23/15 144/57     Hyperlipidemia Lab Results  Component Value Date   CHOL 203* 09/15/2015   CHOL 172 01/08/2014   CHOL 212* 03/18/2012   Lab Results  Component Value Date   HDL 53.60 09/15/2015   HDL 46.70 01/08/2014   HDL 45.40 03/18/2012   Lab Results  Component Value Date   LDLCALC 131* 09/15/2015   LDLCALC 110* 01/08/2014   LDLCALC 104* 09/18/2011   Lab Results  Component Value Date   TRIG 91.0 09/15/2015   TRIG 75.0 01/08/2014   TRIG 84.0 03/18/2012   Lab Results  Component Value Date   CHOLHDL 4 09/15/2015   CHOLHDL 4 01/08/2014   CHOLHDL 5 03/18/2012   Lab Results  Component Value Date   LDLDIRECT 162.4 03/18/2012   LDLDIRECT 147.6 03/16/2010  overall  stable  Eats a healthy diet - a lot of fish and chicken occ bacon -the only bad thing she eats (quit sausage)  Hx of hyperglycemia Lab Results  Component Value Date   HGBA1C 5.6 01/08/2014    Also watches sugar in diet also   Lab Results  Component Value Date   TSH 1.39 09/15/2015     Patient Active Problem List   Diagnosis Date Noted  . Nonallopathic lesion of lumbar region 03/28/2015  . Nonallopathic lesion of thoracic region 03/28/2015  . Strain of back 03/28/2015  . Sprain of sacrum 03/28/2015  . Strain of thoracic region 03/28/2015  . UTI (lower urinary tract infection) 03/15/2014  . Encounter for Medicare annual wellness exam 01/15/2014  . Colon cancer screening 01/15/2014  . Risk for falls 03/25/2012  . Thrombocytopenia ([place]) 09/24/2011  . Breast cancer of lower-outer quadrant of right female breast ([place]) 09/20/2011  . Dyspepsia 07/03/2011  . Vitamin D deficiency 08/29/2009  . DEPENDENT EDEMA, LEGS 09/07/2008  . CONSTIPATION 07/30/2008  . HYPERGLYCEMIA 07/09/2008  . Malignant neoplasm of corpus uteri, except isthmus ([place]) 10/28/2007  . HEARING LOSS, LEFT EAR 10/15/2007  . HEMORRHOIDS, INTERNAL 10/15/2007  .  EXTERNAL HEMORRHOIDS 10/15/2007  . ESOPHAGEAL SPASM 10/15/2007  . DIVERTICULOSIS, COLON 10/15/2007  . Hyperlipidemia 10/07/2007  . Essential hypertension 10/07/2007  . Disorder of bone and cartilage 06/28/2006  . CAROTID BRUITS, BILATERAL 11/25/2005  . AORTIC STENOSIS 10/26/2004  . HIATAL HERNIA 12/27/1995   Past Medical History  Diagnosis Date  . Family history of colonic polyps   . Other abnormal glucose   . Pain in joint, shoulder region   . Unspecified hearing loss   . Aortic stenosis     s/p tissue AVR 05/2011  . Alcohol abuse, unspecified   . Dyskinesia of esophagus   . Diaphragmatic hernia without mention of obstruction or gangrene   . Tobacco use disorder   . Internal hemorrhoids without mention of complication   . Diverticulosis of colon  (without mention of hemorrhage)   . Postmenopausal bleeding   . HTN (hypertension)   . HLD (hyperlipidemia)   . Malignant neoplasm of corpus uteri, except isthmus ([place])   . Breast CA ([place])     rt  . Arthritis   . Heart murmur    Past Surgical History  Procedure Laterality Date  . Rotator cuff repair  05/2008    right  . Laparoscopy w/ total bilateral pelvic and peri-aortic lymphadenectomy  11/04/2007  . Total abdominal hysterectomy w/ bilateral salpingoophorectomy  11/04/07    salpingo-oophrectomy  . Cataract extraction      bil  . Breast biopsy  5/11    DCIS  . Breast lumpectomy  11/2009    DCIS, neg sentinel lymph node biopsy  . Abdominal hysterectomy    . Aortic valve replacement  06/13/2011    Procedure: AORTIC VALVE REPLACEMENT (AVR);  Surgeon: Gaye Pollack, MD;  Location: [facility];  Service: Open Heart Surgery;  Laterality: N/A;  . Breast lumpectomy    . Shoulder surgery    . Abdominal hysterectomy    . Paraaortic lymphadenectomy     Social History  Substance Use Topics  . Smoking status: Former Smoker -- 1.00 packs/day    Quit date: 03/28/2000  . Smokeless tobacco: Never Used  . Alcohol Use: No   Family History  Problem Relation Age of Onset  . Colon polyps Sister     2010  . Cancer Maternal Aunt     possible post menopausal breast cancer  . Heart disease Mother   . Stroke Father   . Heart failure Father    Allergies  Allergen Reactions  . Calcium     REACTION: constipation  . Diovan [Valsartan]     REACTION: abdominal pain  . Ezetimibe     REACTION: fatigue  . Ramipril     REACTION: abdominal pain  . Simvastatin     REACTION: leg pain and body aches   Current Outpatient Prescriptions on File Prior to Visit  Medication Sig Dispense Refill  . amLODipine (NORVASC) 5 MG tablet TAKE 1 TABLET (5 MG TOTAL) BY MOUTH DAILY. 90 tablet 3  . aspirin 325 MG tablet Take 325 mg by mouth. Take 1/2 tablet daily    . carvedilol (COREG) 25 MG tablet Take 1 tablet (25 mg  total) by mouth 2 (two) times daily with a meal. 180 tablet 1  . Cholecalciferol (VITAMIN D) 1000 UNITS capsule Take 1 capsule (1,000 Units total) by mouth 2 (two) times daily. 180 capsule 3  . diphenhydrAMINE (BENADRYL) 50 MG tablet Take 50 mg by mouth every 6 (six) hours as needed.     . hydrochlorothiazide (HYDRODIURIL)  25 MG tablet TAKE 1 TABLET BY MOUTH EVERY DAY 90 tablet 1  . hydrocortisone (PROCTOSOL HC) 2.5 % rectal cream Place 1 application rectally 2 (two) times daily. As needed 30 g 0  . KLOR-CON M20 20 MEQ tablet TAKE 1 TABLET (20 MEQ TOTAL) BY MOUTH DAILY. 90 tablet 3  . Omega-3 Fatty Acids (FISH OIL) 1000 MG CAPS Take 2,000 mg by mouth daily.     . Psyllium (METAMUCIL PO) Take by mouth as needed.     Marland Kitchen spironolactone (ALDACTONE) 25 MG tablet TAKE 1 TABLET (25 MG TOTAL) BY MOUTH DAILY. 90 tablet 1   No current facility-administered medications on file prior to visit.    Review of Systems    Review of Systems  Constitutional: Negative for fever, appetite change, fatigue and unexpected weight change.  Eyes: Negative for pain and visual disturbance.  Respiratory: Negative for cough and shortness of breath.   Cardiovascular: Negative for cp or palpitations    Gastrointestinal: Negative for nausea, diarrhea and constipation.  Genitourinary: Negative for urgency and frequency.  Skin: Negative for pallor or rash   Neurological: Negative for weakness, light-headedness, numbness and headaches.  Hematological: Negative for adenopathy. Does not bruise/bleed easily.  Psychiatric/Behavioral: Negative for dysphoric mood. The patient is not nervous/anxious.      Objective:   Physical Exam  Constitutional: She appears well-developed and well-nourished. No distress.  obese and well appearing   HENT:  Head: Normocephalic and atraumatic.  Right Ear: External ear normal.  Left Ear: External ear normal.  Mouth/Throat: Oropharynx is clear and moist.  Eyes: Conjunctivae and EOM are normal.  Pupils are equal, round, and reactive to light. No scleral icterus.  Neck: Normal range of motion. Neck supple. No JVD present. Carotid bruit is not present. No thyromegaly present.  Cardiovascular: Normal rate, regular rhythm and intact distal pulses.  Exam reveals no gallop.   Murmur heard. Pulmonary/Chest: Effort normal and breath sounds normal. No respiratory distress. She has no wheezes. She exhibits no tenderness.  Abdominal: Soft. Bowel sounds are normal. She exhibits no distension, no abdominal bruit and no mass. There is no tenderness.  Genitourinary: No breast swelling, tenderness, discharge or bleeding.  Area of firmness in L upper outer corner of breast from prev radiation is stable   Otherwise: No mass, nodules, thickening, tenderness, bulging, retraction, inflamation, nipple discharge or skin changes noted.  No axillary or clavicular LA.      Musculoskeletal: Normal range of motion. She exhibits no edema or tenderness.  Lymphadenopathy:    She has no cervical adenopathy.  Neurological: She is alert. She has normal reflexes. No cranial nerve deficit. She exhibits normal muscle tone. Coordination normal.  Skin: Skin is warm and dry. No rash noted. No erythema. No pallor.  Fair complexion  Psychiatric: She has a normal mood and affect.          Assessment & Plan:   Problem List Items Addressed This Visit      Cardiovascular and Mediastinum   Essential hypertension - Primary    bp in fair control at this time  BP Readings from Last 1 Encounters:  09/26/15 116/64   No changes needed Disc lifstyle change with low sodium diet and exercise  Labs reviewed         Musculoskeletal and Integument   Disorder of bone and cartilage    Hx of osteopenia in the past but last dexa reviewed-in nl range No falls or fx  Urged to continue ca and  D Disc need for calcium/ vitamin D/ wt bearing exercise and bone density test every 2 y to monitor Disc safety/ fracture risk in detail            Genitourinary   Malignant neoplasm of corpus uteri, except isthmus ([facility])    Doing well -no gyn symptoms  No longer seen gyn onc or gyn  No further pap tests or guidance needed         Other   HYPERGLYCEMIA    Urged pt to continue low glycemic diet and work on wt loss to prevent DM      Hyperlipidemia    Disc goals for lipids and reasons to control them Rev labs with pt Rev low sat fat diet in detail       Vitamin D deficiency    Vitamin D level is therapeutic with current supplementation Disc importance of this to bone and overall health

## 2015-09-26 NOTE — Assessment & Plan Note (Signed)
Urged pt to continue low glycemic diet and work on wt loss to prevent DM

## 2015-09-26 NOTE — Assessment & Plan Note (Signed)
Vitamin D level is therapeutic with current supplementation Disc importance of this to bone and overall health  

## 2015-09-26 NOTE — Progress Notes (Signed)
Pre visit review using our clinic review tool, if applicable. No additional management support is needed unless otherwise documented below in the visit note. 

## 2015-09-26 NOTE — Assessment & Plan Note (Signed)
Doing well -no gyn symptoms  No longer seen gyn onc or gyn  No further pap tests or guidance needed

## 2015-09-26 NOTE — Patient Instructions (Signed)
Stay active and eat a healthy diet  Blood pressure is ok and labs are stable Vitamin D level is ok- stay on your current vitamin D  Continue self breast exams

## 2015-09-26 NOTE — Assessment & Plan Note (Signed)
bp in fair control at this time  BP Readings from Last 1 Encounters:  09/26/15 116/64   No changes needed Disc lifstyle change with low sodium diet and exercise  Labs reviewed

## 2016-01-16 ENCOUNTER — Other Ambulatory Visit: Payer: Self-pay | Admitting: Cardiovascular Disease

## 2016-02-08 ENCOUNTER — Other Ambulatory Visit: Payer: Self-pay | Admitting: Cardiovascular Disease

## 2016-02-10 ENCOUNTER — Other Ambulatory Visit: Payer: Self-pay | Admitting: Family Medicine

## 2016-02-10 DIAGNOSIS — Z853 Personal history of malignant neoplasm of breast: Secondary | ICD-10-CM

## 2016-02-10 DIAGNOSIS — Z9889 Other specified postprocedural states: Secondary | ICD-10-CM

## 2016-02-16 ENCOUNTER — Ambulatory Visit
Admission: RE | Admit: 2016-02-16 | Discharge: 2016-02-16 | Disposition: A | Discharge status: Home / Self Care | Payer: Medicare Other | Source: Ambulatory Visit | Attending: Family Medicine | Admitting: Family Medicine

## 2016-02-16 DIAGNOSIS — R928 Other abnormal and inconclusive findings on diagnostic imaging of breast: Secondary | ICD-10-CM | POA: Diagnosis not present

## 2016-02-16 DIAGNOSIS — Z9889 Other specified postprocedural states: Secondary | ICD-10-CM

## 2016-02-16 DIAGNOSIS — Z853 Personal history of malignant neoplasm of breast: Secondary | ICD-10-CM

## 2016-02-18 NOTE — Progress Notes (Signed)
Cardiology Office Note Date:  02/20/2016   ID:  [name], [name] [birth year], MRN [id number]  PCP:  Loura Pardon, MD  Cardiologist:  Sherren Mocha, MD    Chief Complaint  Patient presents with  . Aortic Stenosis   History of Present Illness: [name] is a 82 y.o. female who presents for Follow-up of aortic stenosis. The patient underwent bioprosthetic aortic valve replacement in 2013 for treatment of severe symptomatic aortic stenosis. She has also been followed for hypertension.  The patient's last echocardiogram from September 2016 showed normal LV function with a mean transaortic valve gradient of 14 mmHg and a peak estimated pulmonary arterial pressure of 35 mmHg.  Primary limitations are from fatigue and low back pain. Also complains of shortness of breath and leg swelling. No orthopnea or PND. The patient shortness of breath is grossly unchanged. She denies cough, chest pain, chest pressure. She is compliant with her medications. She is not physically active. States that her legs hurt when she walks. She is especially having problems with the right knee.  Past Medical History:  Diagnosis Date  . Alcohol abuse, unspecified   . Aortic stenosis    s/p tissue AVR 05/2011  . Arthritis   . Breast CA ([place])    rt  . Diaphragmatic hernia without mention of obstruction or gangrene   . Diverticulosis of colon (without mention of hemorrhage)   . Dyskinesia of esophagus   . Family history of colonic polyps   . Heart murmur   . HLD (hyperlipidemia)   . HTN (hypertension)   . Internal hemorrhoids without mention of complication   . Malignant neoplasm of corpus uteri, except isthmus ([place])   . Other abnormal glucose   . Pain in joint, shoulder region   . Postmenopausal bleeding   . Tobacco use disorder   . Unspecified hearing loss     Past Surgical History:  Procedure Laterality Date  . ABDOMINAL HYSTERECTOMY    . ABDOMINAL HYSTERECTOMY    . AORTIC VALVE REPLACEMENT   06/13/2011   Procedure: AORTIC VALVE REPLACEMENT (AVR);  Surgeon: Gaye Pollack, MD;  Location: [place];  Service: Open Heart Surgery;  Laterality: N/A;  . 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  . BREAST LUMPECTOMY  11/2009   DCIS, neg sentinel lymph node biopsy  . BREAST LUMPECTOMY    . CATARACT EXTRACTION     bil  . LAPAROSCOPY W/ TOTAL BILATERAL PELVIC AND PERI-AORTIC LYMPHADENECTOMY  11/04/2007  . PARAAORTIC LYMPHADENECTOMY    . ROTATOR CUFF REPAIR  05/2008   right  . SHOULDER SURGERY    . TOTAL ABDOMINAL HYSTERECTOMY W/ BILATERAL SALPINGOOPHORECTOMY  11/04/07   salpingo-oophrectomy    Current Outpatient Prescriptions  Medication Sig Dispense Refill  . hydrocortisone (ANUSOL-HC) 2.5 % rectal cream Place 1 application rectally 2 (two) times daily.    Marland Kitchen amLODipine (NORVASC) 5 MG tablet TAKE 1 TABLET (5 MG TOTAL) BY MOUTH DAILY. 90 tablet 3  . aspirin 325 MG tablet Take 162.5 mg by mouth daily. Take 1/2 tablet daily    . carvedilol (COREG) 25 MG tablet TAKE 1 TABLET (25 MG TOTAL) BY MOUTH 2 (TWO) TIMES DAILY WITH A MEAL. 180 tablet 0  . Cholecalciferol (VITAMIN D) 1000 UNITS capsule Take 1 capsule (1,000 Units total) by mouth 2 (two) times daily. 180 capsule 3  . diphenhydrAMINE (BENADRYL) 50 MG tablet Take 50 mg by mouth every 6 (six) hours as needed for itching or allergies.     Marland Kitchen  hydrochlorothiazide (HYDRODIURIL) 25 MG tablet TAKE 1 TABLET BY MOUTH EVERY DAY 90 tablet 0  . KLOR-CON M20 20 MEQ tablet TAKE 1 TABLET (20 MEQ TOTAL) BY MOUTH DAILY. 90 tablet 3  . Omega-3 Fatty Acids (FISH OIL) 1000 MG CAPS Take 2,000 mg by mouth daily.     . Psyllium (METAMUCIL PO) Take 1 Dose by mouth as directed.     Marland Kitchen spironolactone (ALDACTONE) 25 MG tablet TAKE 1 TABLET (25 MG TOTAL) BY MOUTH DAILY. 90 tablet 1   No current facility-administered medications for this visit.    Allergies:   Calcium; Diovan [valsartan]; Ezetimibe; Ramipril; and Simvastatin   Social History:  The patient  reports that she quit  smoking about 15 years ago. She smoked 1.00 pack per day. She has never used smokeless tobacco. She reports that she does not drink alcohol or use drugs.   Family History:  The patient's family history includes Cancer in her maternal aunt; Colon polyps in her sister; Heart disease in her mother; Heart failure in her father; Stroke in her father.   ROS:  Please see the history of present illness.  Otherwise, review of systems is positive for leg swelling, leg pain, and balance problems.  All other systems are reviewed and negative.   PHYSICAL EXAM: VS:  BP 130/62   Pulse 72   Ht 5\' 6"  (1.676 m)   Wt 206 lb 1.9 oz (93.5 kg)   LMP 05/29/2007   BMI 33.27 kg/m  , BMI Body mass index is 33.27 kg/m. GEN: Well nourished, well developed, in no acute distress  HEENT: normal  Neck: no JVD, no masses. No carotid bruits Cardiac: RRR with 2/6 systolic murmur, early peaking, at the right upper sternal border              Respiratory:  clear to auscultation bilaterally, normal work of breathing GI: soft, nontender, nondistended, + BS MS: no deformity or atrophy  Ext: 1+ bilateral pretibial edema, pedal pulses 2+= bilaterally Skin: warm and dry, no rash Neuro:  Strength and sensation are intact Psych: euthymic mood, full affect  EKG:  EKG is ordered today. The ekg ordered today shows NSR 67, LAFB, nonspecific IVCD  Recent Labs: 06/23/2015: ALT 37; BUN 16.4; Creatinine 0.8; HGB 15.2; Platelets 134; Potassium 4.1; Sodium 139 09/15/2015: TSH 1.39   Lipid Panel     Component Value Date/Time   CHOL 203 (H) 09/15/2015 0926   TRIG 91.0 09/15/2015 0926   TRIG 58 05/13/2006 0933   HDL 53.60 09/15/2015 0926   CHOLHDL 4 09/15/2015 0926   VLDL 18.2 09/15/2015 0926   LDLCALC 131 (H) 09/15/2015 0926   LDLDIRECT 162.4 03/18/2012 0900      Wt Readings from Last 3 Encounters:  02/20/16 206 lb 1.9 oz (93.5 kg)  09/26/15 201 lb 12 oz (91.5 kg)  09/15/15 203 lb 12 oz (92.4 kg)     Cardiac Studies  Reviewed: 2D Echo 02/10/2015: Study Conclusions  - Left ventricle: The cavity size was normal. Systolic function was normal. The estimated ejection fraction was in the range of 60% to 65%. Wall motion was normal; there were no regional wall motion abnormalities. Doppler parameters are consistent with abnormal left ventricular relaxation (grade 1 diastolic dysfunction). - Aortic valve: A bioprosthesis was present. Peak velocity (S): 242 cm/s. Mean gradient (S): 14 mm Hg. - Mitral valve: There was mild regurgitation. - Left atrium: The atrium was mildly dilated. - Pulmonary arteries: Systolic pressure was mildly increased. PA peak  pressure: 35 mm Hg (S). - Pericardium, extracardiac: A trivial pericardial effusion was identified along the right ventricular free wall.  Impressions:  - Compared to the prior study, there has been no significant interval change.  ASSESSMENT AND PLAN: 1.  Aortic valve disease status post bioprosthetic aortic valve replacement: Patient's exam is stable. Patient should continue to follow SBE prophylaxis guidelines. Will repeat her echocardiogram in one year prior to her office visit.  2. Essential hypertension: Patient remains on multidrug therapy. She is tolerating the combination of amlodipine, carvedilol, hydrochlorothiazide, and Aldactone. The patient takes a low-dose of potassium chloride. Will check a metabolic panel this morning.  3. Hyperlipidemia: Diet and lifestyle modification were reviewed. The patient is intolerant to statin drugs, ezetimibe, and other lipid-lowering agents. Current medicines are reviewed with the patient today.  The patient does not have concerns regarding medicines.  Labs/ tests ordered today include:   Orders Placed This Encounter  Procedures  . Basic metabolic panel  . EKG 12-Lead  . ECHOCARDIOGRAM COMPLETE   Disposition:   FU one year  Signed, Sherren Mocha, MD  02/20/2016 1:34 PM    [facility] [place], [place], [place]  [address] Phone: [phone]; Fax: [phone]

## 2016-02-20 ENCOUNTER — Ambulatory Visit (INDEPENDENT_AMBULATORY_CARE_PROVIDER_SITE_OTHER): Payer: Medicare Other | Admitting: Cardiovascular Disease

## 2016-02-20 ENCOUNTER — Encounter: Payer: Self-pay | Admitting: Cardiovascular Disease

## 2016-02-20 VITALS — BP 130/62 | HR 72 | Ht 66.0 in | Wt 206.1 lb

## 2016-02-20 DIAGNOSIS — Z954 Presence of other heart-valve replacement: Secondary | ICD-10-CM

## 2016-02-20 DIAGNOSIS — Z952 Presence of prosthetic heart valve: Secondary | ICD-10-CM

## 2016-02-20 DIAGNOSIS — Z79899 Other long term (current) drug therapy: Secondary | ICD-10-CM

## 2016-02-20 DIAGNOSIS — I10 Essential (primary) hypertension: Secondary | ICD-10-CM

## 2016-02-20 LAB — BASIC METABOLIC PANEL
BUN: 14 mg/dL (ref 7–25)
CALCIUM: 10.5 mg/dL — AB (ref 8.6–10.4)
CO2: 32 mmol/L — ABNORMAL HIGH (ref 20–31)
CREATININE: 0.8 mg/dL (ref 0.60–0.88)
Chloride: 102 mmol/L (ref 98–110)
Glucose, Bld: 107 mg/dL — ABNORMAL HIGH (ref 65–99)
Potassium: 3.9 mmol/L (ref 3.5–5.3)
Sodium: 141 mmol/L (ref 135–146)

## 2016-02-20 NOTE — Patient Instructions (Signed)
Medication Instructions:  Your physician recommends that you continue on your current medications as directed. Please refer to the Current Medication list given to you today.  Labwork: Your physician recommends that you have lab work today: BMP  Testing/Procedures: Your physician has requested that you have an echocardiogram in 1 YEAR (prior to follow up appointment with Dr Burt Knack). Echocardiography is a painless test that uses sound waves to create images of your heart. It provides your doctor with information about the size and shape of your heart and how well your heart's chambers and valves are working. This procedure takes approximately one hour. There are no restrictions for this procedure.  Follow-Up: Your physician wants you to follow-up in: 1 YEAR with Dr Burt Knack.  You will receive a reminder letter in the mail two months in advance. If you don't receive a letter, please call our office to schedule the follow-up appointment.   Any Other Special Instructions Will Be Listed Below (If Applicable).     If you need a refill on your cardiac medications before your next appointment, please call your pharmacy.

## 2016-02-24 ENCOUNTER — Ambulatory Visit (INDEPENDENT_AMBULATORY_CARE_PROVIDER_SITE_OTHER): Payer: Medicare Other

## 2016-02-24 DIAGNOSIS — Z23 Encounter for immunization: Secondary | ICD-10-CM | POA: Diagnosis not present

## 2016-02-27 ENCOUNTER — Other Ambulatory Visit: Payer: Self-pay | Admitting: Cardiovascular Disease

## 2016-03-17 ENCOUNTER — Other Ambulatory Visit: Payer: Self-pay | Admitting: Cardiovascular Disease

## 2016-04-11 ENCOUNTER — Other Ambulatory Visit: Payer: Self-pay | Admitting: Cardiovascular Disease

## 2016-04-24 ENCOUNTER — Other Ambulatory Visit: Payer: Self-pay | Admitting: Cardiovascular Disease

## 2016-05-07 ENCOUNTER — Other Ambulatory Visit: Payer: Self-pay | Admitting: Cardiovascular Disease

## 2016-06-06 ENCOUNTER — Ambulatory Visit (INDEPENDENT_AMBULATORY_CARE_PROVIDER_SITE_OTHER): Payer: Medicare Other | Admitting: Primary Care

## 2016-06-06 VITALS — BP 150/90 | HR 73 | Temp 98.1°F | Ht 66.0 in | Wt 207.8 lb

## 2016-06-06 DIAGNOSIS — N390 Urinary tract infection, site not specified: Secondary | ICD-10-CM

## 2016-06-06 LAB — POC URINALSYSI DIPSTICK (AUTOMATED)
BILIRUBIN UA: NEGATIVE
GLUCOSE UA: NEGATIVE
KETONES UA: NEGATIVE
NITRITE UA: NEGATIVE
PH UA: 6
Protein, UA: NEGATIVE
RBC UA: NEGATIVE
SPEC GRAV UA: 1.02
Urobilinogen, UA: NEGATIVE

## 2016-06-06 MED ORDER — CEPHALEXIN 500 MG PO CAPS: 500.0000 mg | ORAL_CAPSULE | Freq: Two times a day (BID) | ORAL | 0 refills | Status: DC

## 2016-06-06 NOTE — Progress Notes (Signed)
Pre visit review using our clinic review tool, if applicable. No additional management support is needed unless otherwise documented below in the visit note. 

## 2016-06-06 NOTE — Progress Notes (Signed)
Subjective:    Patient ID: [name], female    DOB: 1933/06/27, 83 y.o.   MRN: [id number]  HPI  [name] is an 83 year old female who presents today with a chief complaint of dysuria. She also reports urinary frequency, dark colored urine. Her symptoms have been present for the past 3 weeks. She denies abdominal pain, fever, n/v, flank pain. She's not taken anything OTC for her symptoms and she hasn't been drinking enough water.   Review of Systems  Constitutional: Negative for chills, fatigue and fever.  Gastrointestinal: Negative for abdominal pain, nausea and vomiting.  Genitourinary: Positive for dysuria and frequency. Negative for flank pain and hematuria.       Dark colored urine       Past Medical History:  Diagnosis Date  . Alcohol abuse, unspecified   . Aortic stenosis    s/p tissue AVR 05/2011  . Arthritis   . Breast CA ([place])    rt  . Diaphragmatic hernia without mention of obstruction or gangrene   . Diverticulosis of colon (without mention of hemorrhage)   . Dyskinesia of esophagus   . Family history of colonic polyps   . Heart murmur   . HLD (hyperlipidemia)   . HTN (hypertension)   . Internal hemorrhoids without mention of complication   . Malignant neoplasm of corpus uteri, except isthmus ([place])   . Other abnormal glucose   . Pain in joint, shoulder region   . Postmenopausal bleeding   . Tobacco use disorder   . Unspecified hearing loss      Social History   Social History  . Marital status: Married    Spouse name: N/A  . Number of children: N/A  . Years of education: N/A   Occupational History  . Not on file.   Social History Main Topics  . Smoking status: Former Smoker    Packs/day: 1.00    Quit date: 03/28/2000  . Smokeless tobacco: Never Used  . Alcohol use No  . Drug use: No  . Sexual activity: No   Other Topics Concern  . Not on file   Social History Narrative  . No narrative on file    Past Surgical History:  Procedure  Laterality Date  . ABDOMINAL HYSTERECTOMY    . ABDOMINAL HYSTERECTOMY    . AORTIC VALVE REPLACEMENT  06/13/2011   Procedure: AORTIC VALVE REPLACEMENT (AVR);  Surgeon: Gaye Pollack, MD;  Location: [place];  Service: Open Heart Surgery;  Laterality: N/A;  . 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  . BREAST LUMPECTOMY  11/2009   DCIS, neg sentinel lymph node biopsy  . BREAST LUMPECTOMY    . CATARACT EXTRACTION     bil  . LAPAROSCOPY W/ TOTAL BILATERAL PELVIC AND PERI-AORTIC LYMPHADENECTOMY  11/04/2007  . PARAAORTIC LYMPHADENECTOMY    . ROTATOR CUFF REPAIR  05/2008   right  . SHOULDER SURGERY    . TOTAL ABDOMINAL HYSTERECTOMY W/ BILATERAL SALPINGOOPHORECTOMY  11/04/07   salpingo-oophrectomy    Family History  Problem Relation Age of Onset  . Colon polyps Sister     2010  . Cancer Maternal Aunt     possible post menopausal breast cancer  . Heart disease Mother   . Stroke Father   . Heart failure Father     Allergies  Allergen Reactions  . Calcium     REACTION: constipation  . Diovan [Valsartan]     REACTION: abdominal pain  . Ezetimibe  REACTION: fatigue  . Ramipril     REACTION: abdominal pain  . Simvastatin     REACTION: leg pain and body aches    Current Outpatient Prescriptions on File Prior to Visit  Medication Sig Dispense Refill  . amLODipine (NORVASC) 5 MG tablet TAKE 1 TABLET (5 MG TOTAL) BY MOUTH DAILY. 90 tablet 3  . aspirin 325 MG tablet Take 162.5 mg by mouth daily. Take 1/2 tablet daily    . carvedilol (COREG) 25 MG tablet TAKE 1 TABLET (25 MG TOTAL) BY MOUTH 2 (TWO) TIMES DAILY WITH A MEAL. 180 tablet 2  . Cholecalciferol (VITAMIN D) 1000 UNITS capsule Take 1 capsule (1,000 Units total) by mouth 2 (two) times daily. 180 capsule 3  . diphenhydrAMINE (BENADRYL) 50 MG tablet Take 50 mg by mouth every 6 (six) hours as needed for itching or allergies.     . hydrochlorothiazide (HYDRODIURIL) 25 MG tablet TAKE 1 TABLET BY MOUTH EVERY DAY 90 tablet 3  . hydrocortisone  (ANUSOL-HC) 2.5 % rectal cream Place 1 application rectally 2 (two) times daily.    Marland Kitchen KLOR-CON M20 20 MEQ tablet TAKE 1 TABLET (20 MEQ TOTAL) BY MOUTH DAILY. 90 tablet 3  . Omega-3 Fatty Acids (FISH OIL) 1000 MG CAPS Take 2,000 mg by mouth daily.     . Psyllium (METAMUCIL PO) Take 1 Dose by mouth as directed.     Marland Kitchen spironolactone (ALDACTONE) 25 MG tablet TAKE 1 TABLET (25 MG TOTAL) BY MOUTH DAILY. 90 tablet 3   No current facility-administered medications on file prior to visit.     BP (!) 150/90   Pulse 73   Temp 98.1 F (36.7 C) (Oral)   Ht 5\' 6"  (1.676 m)   Wt 207 lb 12.8 oz (94.3 kg)   LMP 05/29/2007   SpO2 98%   BMI 33.54 kg/m    Objective:   Physical Exam  Constitutional: She appears well-nourished.  Neck: Neck supple.  Cardiovascular: Normal rate and regular rhythm.   Murmur heard. Pulmonary/Chest: Effort normal and breath sounds normal.  Abdominal: Soft. Bowel sounds are normal. There is no tenderness. There is no CVA tenderness.          Assessment & Plan:  UTI:  Dysuria, frequency, dark colored urine x 2 weeks. She's not taken anything OTC.  Exam today stable. UA: 3+ leuks, negative nitrites, negative blood. Culture sent. Given presence of illness with presence of leuks will treat. Rx for Cephalexin course sent to pharmacy. Discussed importance of water consumption, rest, follow up PRN.  Sheral Flow, NP

## 2016-06-06 NOTE — Patient Instructions (Signed)
Your urine suggests that you likely have a urinary tract infection.  Start Cephalexin antibiotics. Take 1 capsule by mouth twice daily for 7 days.  Ensure you are consuming 64 ounces of water daily.  Please notify us if no improvement in 3-4 days.  It was a pleasure to see you today!

## 2016-06-08 LAB — URINE CULTURE

## 2016-07-17 ENCOUNTER — Encounter: Payer: Self-pay | Admitting: Internal Medicine

## 2016-07-17 ENCOUNTER — Ambulatory Visit (INDEPENDENT_AMBULATORY_CARE_PROVIDER_SITE_OTHER): Payer: Medicare Other | Admitting: Internal Medicine

## 2016-07-17 VITALS — BP 130/72 | HR 74 | Temp 98.3°F | Wt 206.0 lb

## 2016-07-17 DIAGNOSIS — J209 Acute bronchitis, unspecified: Secondary | ICD-10-CM | POA: Diagnosis not present

## 2016-07-17 MED ORDER — PREDNISONE 10 MG PO TABS
ORAL_TABLET | ORAL | 0 refills | Status: DC
Start: 2016-07-17 — End: 2016-10-15

## 2016-07-17 MED ORDER — AZITHROMYCIN 250 MG PO TABS: ORAL_TABLET | ORAL | 0 refills | Status: DC

## 2016-07-17 NOTE — Patient Instructions (Signed)

## 2016-07-17 NOTE — Progress Notes (Signed)
HPI  Pt presents to the clinic today with c/o cough, chest congestion, wheezing and shortness of breath. This started 1-2 weeks ago. The cough is non productive. She denies runny nose, ear pain, sore throat, fever, chills or body aches. She has tried Robitussin with minimal relief. She has no history of allergies or breathing problems. She has not had sick contacts that she is aware of. Her flu and pneumonia vaccines are UTD.  Review of Systems        Past Medical History:  Diagnosis Date  . Alcohol abuse, unspecified   . Aortic stenosis    s/p tissue AVR 05/2011  . Arthritis   . Breast CA ([place])    rt  . Diaphragmatic hernia without mention of obstruction or gangrene   . Diverticulosis of colon (without mention of hemorrhage)   . Dyskinesia of esophagus   . Family history of colonic polyps   . Heart murmur   . HLD (hyperlipidemia)   . HTN (hypertension)   . Internal hemorrhoids without mention of complication   . Malignant neoplasm of corpus uteri, except isthmus ([place])   . Other abnormal glucose   . Pain in joint, shoulder region   . Postmenopausal bleeding   . Tobacco use disorder   . Unspecified hearing loss     Family History  Problem Relation Age of Onset  . Colon polyps Sister     2010  . Cancer Maternal Aunt     possible post menopausal breast cancer  . Heart disease Mother   . Stroke Father   . Heart failure Father     Social History   Social History  . Marital status: Married    Spouse name: N/A  . Number of children: N/A  . Years of education: N/A   Occupational History  . Not on file.   Social History Main Topics  . Smoking status: Former Smoker    Packs/day: 1.00    Quit date: 03/28/2000  . Smokeless tobacco: Never Used  . Alcohol use No  . Drug use: No  . Sexual activity: No   Other Topics Concern  . Not on file   Social History Narrative  . No narrative on file    Allergies  Allergen Reactions  . Calcium     REACTION: constipation   . Diovan [Valsartan]     REACTION: abdominal pain  . Ezetimibe     REACTION: fatigue  . Ramipril     REACTION: abdominal pain  . Simvastatin     REACTION: leg pain and body aches     Constitutional: Denies headache, fatigue, fever or abrupt weight changes.  HEENT:  Denies eye redness, eye pain, pressure behind the eyes, facial pain, nasal congestion, ear pain, ringing in the ears, wax buildup, runny nose or sore throat. Respiratory: Positive cough and shortness of breath. Denies difficulty breathing.  Cardiovascular: Denies chest pain, chest tightness, palpitations or swelling in the hands or feet.   No other specific complaints in a complete review of systems (except as listed in HPI above).  Objective:   BP 130/72   Pulse 74   Temp 98.3 F (36.8 C) (Oral)   Wt 206 lb (93.4 kg)   LMP 05/29/2007   SpO2 96%   BMI 33.25 kg/m   Wt Readings from Last 3 Encounters:  07/17/16 206 lb (93.4 kg)  06/06/16 207 lb 12.8 oz (94.3 kg)  02/20/16 206 lb 1.9 oz (93.5 kg)     General: Appears  her stated age, in NAD. HEENT:  Throat/Mouth: Teeth present, mucosa erythematous and moist, no exudate noted, no lesions or ulcerations noted.  Neck: No cervical lymphadenopathy.  Cardiovascular: Normal rate and rhythm. S1,S2 noted.  Murmur noted.  Pulmonary/Chest: Normal effort with scattered rhonchi and bilateral expiratory wheezing noted.      Assessment & Plan:   Acute Bronchitis:  Get some rest and drink plenty of water eRx for Azithromax x 5 days eRx for Pred Taper x 6 days  RTC as needed or if symptoms persist.   Webb Silversmith, NP

## 2016-10-15 ENCOUNTER — Ambulatory Visit (INDEPENDENT_AMBULATORY_CARE_PROVIDER_SITE_OTHER): Payer: Medicare Other | Admitting: Family Medicine

## 2016-10-15 ENCOUNTER — Encounter: Payer: Self-pay | Admitting: Family Medicine

## 2016-10-15 VITALS — BP 140/70 | HR 66 | Temp 97.4°F | Ht 66.0 in | Wt 209.5 lb

## 2016-10-15 DIAGNOSIS — R300 Dysuria: Secondary | ICD-10-CM | POA: Diagnosis not present

## 2016-10-15 DIAGNOSIS — R350 Frequency of micturition: Secondary | ICD-10-CM

## 2016-10-15 DIAGNOSIS — N3941 Urge incontinence: Secondary | ICD-10-CM | POA: Insufficient documentation

## 2016-10-15 LAB — POC URINALSYSI DIPSTICK (AUTOMATED)
BILIRUBIN UA: NEGATIVE
Glucose, UA: NEGATIVE
KETONES UA: NEGATIVE
NITRITE UA: NEGATIVE
PH UA: 6.5 (ref 5.0–8.0)
PROTEIN UA: NEGATIVE
RBC UA: NEGATIVE
Spec Grav, UA: 1.015 (ref 1.010–1.025)
UROBILINOGEN UA: 0.2 U/dL

## 2016-10-15 NOTE — Assessment & Plan Note (Signed)
Pt thinks she may have had uti that resolved Reassuring ua today  Disc imp of keeping dilute urine through hydration  Will aim for 64 oz fluids daily (unless fluid restricted by cardiology) Reassuring exam Will continue to watch

## 2016-10-15 NOTE — Patient Instructions (Signed)
Keep drinking fluids  Aim for 64 oz of fluids daily -mostly water  If urine is dark you need more fluids Update [place] if symptoms return   Take care of yourself

## 2016-10-15 NOTE — Progress Notes (Signed)
Subjective:    Patient ID: [name], female    DOB: 11-16-1933, 83 y.o.   MRN: [id number]  HPI Here for urinary symptoms   She has had 2 mo of symptoms  Now doing better Urine was darker than usual/cloudy  Very mild dysuria  Urgency to urinate/ some dribbling and frequency  No blood  Today much improved   She has worked on hydration - drinks a glass of water 5-6 times per day and then a full glass with meals and medicines  Not a big drinker- made the effort   Drinks 2 cups of coffee= then water for the rest of the day  One glass of wine at night   Bladder pain was present and it went away   On 2 diuretics Lab Results  Component Value Date   CREATININE 0.80 02/20/2016   BUN 14 02/20/2016   NA 141 02/20/2016   K 3.9 02/20/2016   CL 102 02/20/2016   CO2 32 (H) 02/20/2016    UA : sm leuk/otherwise normal   Wt Readings from Last 3 Encounters:  10/15/16 209 lb 8 oz (95 kg)  07/17/16 206 lb (93.4 kg)  06/06/16 207 lb 12.8 oz (94.3 kg)     Last UTI was tx by NP Carlis Abbott in January with keflex cx showed mixed org/ ? Flora   Results for orders placed or performed in visit on 10/15/16  POCT Urinalysis Dipstick (Automated)  Result Value Ref Range   Color, UA Light Yellow    Clarity, UA Clear    Glucose, UA Negative    Bilirubin, UA Negative    Ketones, UA Negative    Spec Grav, UA 1.015 1.010 - 1.025   Blood, UA Negative    pH, UA 6.5 5.0 - 8.0   Protein, UA Negative    Urobilinogen, UA 0.2 0.2 or 1.0 E.U./dL   Nitrite, UA Negative    Leukocytes, UA Small (1+) (A) Negative    Patient Active Problem List   Diagnosis Date Noted  . Urinary frequency 10/15/2016  . Nonallopathic lesion of lumbar region 03/28/2015  . Nonallopathic lesion of thoracic region 03/28/2015  . Strain of back 03/28/2015  . Sprain of sacrum 03/28/2015  . Strain of thoracic region 03/28/2015  . Encounter for Medicare annual wellness exam 01/15/2014  . Colon cancer screening 01/15/2014    . Risk for falls 03/25/2012  . Thrombocytopenia ([place]) 09/24/2011  . Breast cancer of lower-outer quadrant of right female breast ([place]) 09/20/2011  . Dyspepsia 07/03/2011  . Vitamin D deficiency 08/29/2009  . DEPENDENT EDEMA, LEGS 09/07/2008  . CONSTIPATION 07/30/2008  . HYPERGLYCEMIA 07/09/2008  . Malignant neoplasm of corpus uteri, except isthmus ([place]) 10/28/2007  . HEARING LOSS, LEFT EAR 10/15/2007  . HEMORRHOIDS, INTERNAL 10/15/2007  . EXTERNAL HEMORRHOIDS 10/15/2007  . ESOPHAGEAL SPASM 10/15/2007  . DIVERTICULOSIS, COLON 10/15/2007  . Hyperlipidemia 10/07/2007  . Essential hypertension 10/07/2007  . Disorder of bone and cartilage 06/28/2006  . CAROTID BRUITS, BILATERAL 11/25/2005  . AORTIC STENOSIS 10/26/2004  . HIATAL HERNIA 12/27/1995   Past Medical History:  Diagnosis Date  . Alcohol abuse, unspecified   . Aortic stenosis    s/p tissue AVR 05/2011  . Arthritis   . Breast CA ([place])    rt  . Diaphragmatic hernia without mention of obstruction or gangrene   . Diverticulosis of colon (without mention of hemorrhage)   . Dyskinesia of esophagus   . Family history of colonic polyps   .  Heart murmur   . HLD (hyperlipidemia)   . HTN (hypertension)   . Internal hemorrhoids without mention of complication   . Malignant neoplasm of corpus uteri, except isthmus ([place])   . Other abnormal glucose   . Pain in joint, shoulder region   . Postmenopausal bleeding   . Tobacco use disorder   . Unspecified hearing loss    Past Surgical History:  Procedure Laterality Date  . ABDOMINAL HYSTERECTOMY    . ABDOMINAL HYSTERECTOMY    . AORTIC VALVE REPLACEMENT  06/13/2011   Procedure: AORTIC VALVE REPLACEMENT (AVR);  Surgeon: Gaye Pollack, MD;  Location: [facility];  Service: Open Heart Surgery;  Laterality: N/A;  . 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  . BREAST LUMPECTOMY  11/2009   DCIS, neg sentinel lymph node biopsy  . BREAST LUMPECTOMY    . CATARACT EXTRACTION     bil  . LAPAROSCOPY W/ TOTAL  BILATERAL PELVIC AND PERI-AORTIC LYMPHADENECTOMY  11/04/2007  . PARAAORTIC LYMPHADENECTOMY    . ROTATOR CUFF REPAIR  05/2008   right  . SHOULDER SURGERY    . TOTAL ABDOMINAL HYSTERECTOMY W/ BILATERAL SALPINGOOPHORECTOMY  11/04/07   salpingo-oophrectomy   Social History  Substance Use Topics  . Smoking status: Former Smoker    Packs/day: 1.00    Quit date: 03/28/2000  . Smokeless tobacco: Never Used  . Alcohol use No   Family History  Problem Relation Age of Onset  . Colon polyps Sister        2010  . Heart disease Mother   . Stroke Father   . Heart failure Father   . Cancer Maternal Aunt        possible post menopausal breast cancer   Allergies  Allergen Reactions  . Calcium     REACTION: constipation  . Diovan [Valsartan]     REACTION: abdominal pain  . Ezetimibe     REACTION: fatigue  . Ramipril     REACTION: abdominal pain  . Simvastatin     REACTION: leg pain and body aches   Current Outpatient Prescriptions on File Prior to Visit  Medication Sig Dispense Refill  . amLODipine (NORVASC) 5 MG tablet TAKE 1 TABLET (5 MG TOTAL) BY MOUTH DAILY. 90 tablet 3  . aspirin 325 MG tablet Take 162.5 mg by mouth daily. Take 1/2 tablet daily    . carvedilol (COREG) 25 MG tablet TAKE 1 TABLET (25 MG TOTAL) BY MOUTH 2 (TWO) TIMES DAILY WITH A MEAL. 180 tablet 2  . Cholecalciferol (VITAMIN D) 1000 UNITS capsule Take 1 capsule (1,000 Units total) by mouth 2 (two) times daily. 180 capsule 3  . diphenhydrAMINE (BENADRYL) 50 MG tablet Take 50 mg by mouth every 6 (six) hours as needed for itching or allergies.     . hydrochlorothiazide (HYDRODIURIL) 25 MG tablet TAKE 1 TABLET BY MOUTH EVERY DAY 90 tablet 3  . hydrocortisone (ANUSOL-HC) 2.5 % rectal cream Place 1 application rectally 2 (two) times daily.    Marland Kitchen KLOR-CON M20 20 MEQ tablet TAKE 1 TABLET (20 MEQ TOTAL) BY MOUTH DAILY. 90 tablet 3  . Melatonin 3 MG CAPS Take 1 capsule by mouth at bedtime.    . Omega-3 Fatty Acids (FISH OIL) 1000 MG  CAPS Take 2,000 mg by mouth daily.     . Psyllium (METAMUCIL PO) Take 1 Dose by mouth as directed.     Marland Kitchen spironolactone (ALDACTONE) 25 MG tablet TAKE 1 TABLET (25 MG TOTAL) BY MOUTH DAILY. [place]  tablet 3   No current facility-administered medications on file prior to visit.      Review of Systems    Review of Systems  Constitutional: Negative for fever, appetite change, fatigue and unexpected weight change.  Eyes: Negative for pain and visual disturbance.  Respiratory: Negative for cough and shortness of breath.   Cardiovascular: Negative for cp or palpitations    Gastrointestinal: Negative for nausea, diarrhea and constipation.  Genitourinary: pos for urgency and frequency. pos for hx of overactive bladder/urge incontinence , neg for hematuria/flank pain or dysuria (now) Skin: Negative for pallor or rash   Neurological: Negative for weakness, light-headedness, numbness and headaches.  Hematological: Negative for adenopathy. Does not bruise/bleed easily.  Psychiatric/Behavioral: Negative for dysphoric mood. The patient is not nervous/anxious.      Objective:   Physical Exam  Constitutional: She appears well-developed and well-nourished. No distress.  obese and well appearing   HENT:  Head: Normocephalic and atraumatic.  Eyes: Conjunctivae and EOM are normal. Pupils are equal, round, and reactive to light.  Neck: Normal range of motion. Neck supple.  Cardiovascular: Normal rate, regular rhythm and normal heart sounds.   Pulmonary/Chest: Effort normal and breath sounds normal.  Abdominal: Soft. Bowel sounds are normal. She exhibits no distension. There is no tenderness. There is no rebound.  No cva tenderness  No suprapubic tenderness or fullness  Musculoskeletal: She exhibits no edema.  Lymphadenopathy:    She has no cervical adenopathy.  Neurological: She is alert.  Skin: No rash noted.  Psychiatric: She has a normal mood and affect.          Assessment & Plan:    Problem List Items Addressed This Visit      Other   Urinary frequency    Pt thinks she may have had uti that resolved Reassuring ua today  Disc imp of keeping dilute urine through hydration  Will aim for 64 oz fluids daily (unless fluid restricted by cardiology) Reassuring exam Will continue to watch        Other Visit Diagnoses    Dysuria    -  Primary   Relevant Orders   POCT Urinalysis Dipstick (Automated) (Completed)

## 2016-11-06 ENCOUNTER — Telehealth: Payer: Self-pay | Admitting: Family Medicine

## 2016-11-06 DIAGNOSIS — E559 Vitamin D deficiency, unspecified: Secondary | ICD-10-CM

## 2016-11-06 DIAGNOSIS — R7309 Other abnormal glucose: Secondary | ICD-10-CM

## 2016-11-06 DIAGNOSIS — I10 Essential (primary) hypertension: Secondary | ICD-10-CM

## 2016-11-06 DIAGNOSIS — D696 Thrombocytopenia, unspecified: Secondary | ICD-10-CM

## 2016-11-06 DIAGNOSIS — E7800 Pure hypercholesterolemia, unspecified: Secondary | ICD-10-CM

## 2016-11-06 NOTE — Telephone Encounter (Signed)
-----   Message from Eustace Pen, LPN sent at 2/29/7989  4:56 PM EDT ----- Regarding: Labs 6/13 Please place lab orders.   Traditional Medicare

## 2016-11-07 ENCOUNTER — Ambulatory Visit (INDEPENDENT_AMBULATORY_CARE_PROVIDER_SITE_OTHER): Payer: Medicare Other

## 2016-11-07 VITALS — BP 132/72 | HR 65 | Temp 97.8°F | Ht 65.5 in | Wt 206.5 lb

## 2016-11-07 DIAGNOSIS — R7309 Other abnormal glucose: Secondary | ICD-10-CM

## 2016-11-07 DIAGNOSIS — E7800 Pure hypercholesterolemia, unspecified: Secondary | ICD-10-CM

## 2016-11-07 DIAGNOSIS — E559 Vitamin D deficiency, unspecified: Secondary | ICD-10-CM

## 2016-11-07 DIAGNOSIS — I10 Essential (primary) hypertension: Secondary | ICD-10-CM

## 2016-11-07 DIAGNOSIS — Z0000 Encounter for general adult medical examination without abnormal findings: Secondary | ICD-10-CM

## 2016-11-07 DIAGNOSIS — D696 Thrombocytopenia, unspecified: Secondary | ICD-10-CM

## 2016-11-07 LAB — LIPID PANEL
Cholesterol: 185 mg/dL (ref 0–200)
HDL: 56 mg/dL (ref >39.00)
LDL Cholesterol: 112 mg/dL — ABNORMAL HIGH (ref 0–99)
NONHDL: 129.1
Total CHOL/HDL Ratio: 3
Triglycerides: 88 mg/dL (ref 0.0–149.0)
VLDL: 17.6 mg/dL (ref 0.0–40.0)

## 2016-11-07 LAB — COMPREHENSIVE METABOLIC PANEL
ALBUMIN: 4.5 g/dL (ref 3.5–5.2)
ALT: 33 U/L (ref 0–35)
AST: 26 U/L (ref 0–37)
Alkaline Phosphatase: 43 U/L (ref 39–117)
BUN: 15 mg/dL (ref 6–23)
CHLORIDE: 99 meq/L (ref 96–112)
CO2: 32 mEq/L (ref 19–32)
CREATININE: 0.77 mg/dL (ref 0.40–1.20)
Calcium: 10.3 mg/dL (ref 8.4–10.5)
GFR: 76.46 mL/min (ref >60.00)
Glucose, Bld: 128 mg/dL — ABNORMAL HIGH (ref 70–99)
Potassium: 3.8 mEq/L (ref 3.5–5.1)
Sodium: 137 mEq/L (ref 135–145)
Total Bilirubin: 0.8 mg/dL (ref 0.2–1.2)
Total Protein: 7 g/dL (ref 6.0–8.3)

## 2016-11-07 LAB — CBC WITH DIFFERENTIAL/PLATELET
BASOS PCT: 0.6 % (ref 0.0–3.0)
Basophils Absolute: 0 10*3/uL (ref 0.0–0.1)
EOS ABS: 0.3 10*3/uL (ref 0.0–0.7)
EOS PCT: 5.4 % — AB (ref 0.0–5.0)
HEMATOCRIT: 44.8 % (ref 36.0–46.0)
HEMOGLOBIN: 15.6 g/dL — AB (ref 12.0–15.0)
LYMPHS PCT: 19.3 % (ref 12.0–46.0)
Lymphs Abs: 1 10*3/uL (ref 0.7–4.0)
MCHC: 34.9 g/dL (ref 30.0–36.0)
MCV: 88.5 fl (ref 78.0–100.0)
Monocytes Absolute: 0.4 10*3/uL (ref 0.1–1.0)
Monocytes Relative: 7.7 % (ref 3.0–12.0)
Neutro Abs: 3.6 10*3/uL (ref 1.4–7.7)
Neutrophils Relative %: 67 % (ref 43.0–77.0)
Platelets: 151 10*3/uL (ref 150.0–400.0)
RBC: 5.06 Mil/uL (ref 3.87–5.11)
RDW: 13.6 % (ref 11.5–15.5)
WBC: 5.4 10*3/uL (ref 4.0–10.5)

## 2016-11-07 LAB — TSH: TSH: 1.17 u[IU]/mL (ref 0.35–4.50)

## 2016-11-07 LAB — HEMOGLOBIN A1C: HEMOGLOBIN A1C: 6 % (ref 4.6–6.5)

## 2016-11-07 LAB — VITAMIN D 25 HYDROXY (VIT D DEFICIENCY, FRACTURES): VITD: 44.8 ng/mL (ref 30.00–100.00)

## 2016-11-07 NOTE — Progress Notes (Signed)
Pre visit review using our clinic review tool, if applicable. No additional management support is needed unless otherwise documented below in the visit note. 

## 2016-11-07 NOTE — Progress Notes (Signed)
PCP notes:   Health maintenance:  No gaps identified.   Abnormal screenings:   None  Patient concerns:   None  Nurse concerns:  None  Next PCP appt:   11/14/16 @ 1130  I reviewed health advisor's note, was available for consultation, and agree with documentation and plan. Loura Pardon MD

## 2016-11-07 NOTE — Patient Instructions (Signed)
[name] , Thank you for taking time to come for your Medicare Wellness Visit. I appreciate your ongoing commitment to your health goals. Please review the following plan we discussed and let me know if I can assist you in the future.   These are the goals we discussed: Goals    . Weight < 181 lb (82.101 kg)          Target weight loss is 20 lbs. Starting 11/07/2016, I will continue to exercise for at least 30 min 3 days per week and to make healthy food choices.         This is a list of the screening recommended for you and due dates:  Health Maintenance  Topic Date Due  . Pap Smear  11/07/2048*  . Flu Shot  12/26/2016  . Mammogram  02/15/2017  . Tetanus Vaccine  03/09/2019  . DEXA scan (bone density measurement)  Completed  . Pneumonia vaccines  Completed  *Topic was postponed. The date shown is not the original due date.   Preventive Care for Adults  A healthy lifestyle and preventive care can promote health and wellness. Preventive health guidelines for adults include the following key practices.  . A routine yearly physical is a good way to check with your health care provider about your health and preventive screening. It is a chance to share any concerns and updates on your health and to receive a thorough exam.  . Visit your dentist for a routine exam and preventive care every 6 months. Brush your teeth twice a day and floss once a day. Good oral hygiene prevents tooth decay and gum disease.  . The frequency of eye exams is based on your age, health, family medical history, use  of contact lenses, and other factors. Follow your health care provider's ecommendations for frequency of eye exams.  . Eat a healthy diet. Foods like vegetables, fruits, whole grains, low-fat dairy products, and lean protein foods contain the nutrients you need without too many calories. Decrease your intake of foods high in solid fats, added sugars, and salt. Eat the right amount of calories for  you. Get information about a proper diet from your health care provider, if necessary.  . Regular physical exercise is one of the most important things you can do for your health. Most adults should get at least 150 minutes of moderate-intensity exercise (any activity that increases your heart rate and causes you to sweat) each week. In addition, most adults need muscle-strengthening exercises on 2 or more days a week.  Silver Sneakers may be a benefit available to you. To determine eligibility, you may visit the website: www.silversneakers.com or contact program at (352)313-1977 Mon-Fri between 8AM-8PM.   . Maintain a healthy weight. The body mass index (BMI) is a screening tool to identify possible weight problems. It provides an estimate of body fat based on height and weight. Your health care provider can find your BMI and can help you achieve or maintain a healthy weight.   For adults 20 years and older: ? A BMI below 18.5 is considered underweight. ? A BMI of 18.5 to 24.9 is normal. ? A BMI of 25 to 29.9 is considered overweight. ? A BMI of 30 and above is considered obese.   . Maintain normal blood lipids and cholesterol levels by exercising and minimizing your intake of saturated fat. Eat a balanced diet with plenty of fruit and vegetables. Blood tests for lipids and cholesterol should begin at age  20 and be repeated every 5 years. If your lipid or cholesterol levels are high, you are over 50, or you are at high risk for heart disease, you may need your cholesterol levels checked more frequently. Ongoing high lipid and cholesterol levels should be treated with medicines if diet and exercise are not working.  . If you smoke, find out from your health care provider how to quit. If you do not use tobacco, please do not start.  . If you choose to drink alcohol, please do not consume more than 2 drinks per day. One drink is considered to be 12 ounces (355 mL) of beer, 5 ounces (148 mL) of  wine, or 1.5 ounces (44 mL) of liquor.  . If you are 67-72 years old, ask your health care provider if you should take aspirin to prevent strokes.  . Use sunscreen. Apply sunscreen liberally and repeatedly throughout the day. You should seek shade when your shadow is shorter than you. Protect yourself by wearing long sleeves, pants, a wide-brimmed hat, and sunglasses year round, whenever you are outdoors.  . Once a month, do a whole body skin exam, using a mirror to look at the skin on your back. Tell your health care provider of new moles, moles that have irregular borders, moles that are larger than a pencil eraser, or moles that have changed in shape or color.

## 2016-11-07 NOTE — Progress Notes (Signed)
Subjective:   [name] is a 83 y.o. female who presents for Medicare Annual (Subsequent) preventive examination.  Review of Systems:  N/A Cardiac Risk Factors include: advanced age (>60men, >51 women);obesity (BMI >30kg/m2);hypertension     Objective:     Vitals: BP 132/72 (BP Location: Right Arm, Patient Position: Sitting, Cuff Size: Large)   Pulse 65   Temp 97.8 F (36.6 C) (Oral)   Ht 5' 5.5" (1.664 m) Comment: no shoes  Wt 206 lb 8 oz (93.7 kg)   LMP 05/29/2007   SpO2 95%   BMI 33.84 kg/m   Body mass index is 33.84 kg/m.   Tobacco History  Smoking Status  . Former Smoker  . Packs/day: 1.00  . Quit date: 03/28/2000  Smokeless Tobacco  . Never Used     Counseling given: No   Past Medical History:  Diagnosis Date  . Alcohol abuse, unspecified   . Aortic stenosis    s/p tissue AVR 05/2011  . Arthritis   . Breast CA ([place])    rt  . Diaphragmatic hernia without mention of obstruction or gangrene   . Diverticulosis of colon (without mention of hemorrhage)   . Dyskinesia of esophagus   . Family history of colonic polyps   . Heart murmur   . HLD (hyperlipidemia)   . HTN (hypertension)   . Internal hemorrhoids without mention of complication   . Malignant neoplasm of corpus uteri, except isthmus ([place])   . Other abnormal glucose   . Pain in joint, shoulder region   . Postmenopausal bleeding   . Tobacco use disorder   . Unspecified hearing loss    Past Surgical History:  Procedure Laterality Date  . ABDOMINAL HYSTERECTOMY    . ABDOMINAL HYSTERECTOMY    . AORTIC VALVE REPLACEMENT  06/13/2011   Procedure: AORTIC VALVE REPLACEMENT (AVR);  Surgeon: Gaye Pollack, MD;  Location: [facility];  Service: Open Heart Surgery;  Laterality: N/A;  . 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  . BREAST LUMPECTOMY  11/2009   DCIS, neg sentinel lymph node biopsy  . BREAST LUMPECTOMY    . CATARACT EXTRACTION     bil  . LAPAROSCOPY W/ TOTAL BILATERAL PELVIC AND PERI-AORTIC LYMPHADENECTOMY   11/04/2007  . PARAAORTIC LYMPHADENECTOMY    . ROTATOR CUFF REPAIR  05/2008   right  . SHOULDER SURGERY    . TOTAL ABDOMINAL HYSTERECTOMY W/ BILATERAL SALPINGOOPHORECTOMY  11/04/07   salpingo-oophrectomy   Family History  Problem Relation Age of Onset  . Colon polyps Sister        2010  . Heart disease Mother   . Stroke Father   . Heart failure Father   . Cancer Maternal Aunt        possible post menopausal breast cancer   History  Sexual Activity  . Sexual activity: No    Outpatient Encounter Prescriptions as of 11/07/2016  Medication Sig  . amLODipine (NORVASC) 5 MG tablet TAKE 1 TABLET (5 MG TOTAL) BY MOUTH DAILY.  Marland Kitchen aspirin 325 MG tablet Take 162.5 mg by mouth daily. Take 1/2 tablet daily  . carvedilol (COREG) 25 MG tablet TAKE 1 TABLET (25 MG TOTAL) BY MOUTH 2 (TWO) TIMES DAILY WITH A MEAL.  Marland Kitchen Cholecalciferol (VITAMIN D) 1000 UNITS capsule Take 1 capsule (1,000 Units total) by mouth 2 (two) times daily.  . diphenhydrAMINE (BENADRYL) 50 MG tablet Take 50 mg by mouth every 6 (six) hours as needed for itching or allergies.   Marland Kitchen  hydrochlorothiazide (HYDRODIURIL) 25 MG tablet TAKE 1 TABLET BY MOUTH EVERY DAY  . hydrocortisone (ANUSOL-HC) 2.5 % rectal cream Place 1 application rectally 2 (two) times daily.  Marland Kitchen KLOR-CON M20 20 MEQ tablet TAKE 1 TABLET (20 MEQ TOTAL) BY MOUTH DAILY.  . Melatonin 3 MG CAPS Take 1 capsule by mouth at bedtime.  . Omega-3 Fatty Acids (FISH OIL) 1000 MG CAPS Take 2,000 mg by mouth daily.   . Psyllium (METAMUCIL PO) Take 1 Dose by mouth as directed.   Marland Kitchen spironolactone (ALDACTONE) 25 MG tablet TAKE 1 TABLET (25 MG TOTAL) BY MOUTH DAILY.   No facility-administered encounter medications on file as of 11/07/2016.     Activities of Daily Living In your present state of health, do you have any difficulty performing the following activities: 11/07/2016  Hearing? Y  Vision? N  Difficulty concentrating or making decisions? N  Walking or climbing stairs? N    Dressing or bathing? N  Doing errands, shopping? N  Preparing Food and eating ? N  Using the Toilet? N  In the past six months, have you accidently leaked urine? Y  Do you have problems with loss of bowel control? N  Managing your Medications? N  Managing your Finances? N  Housekeeping or managing your Housekeeping? N  Some recent data might be hidden    Patient Care Team: Tower, Wynelle Fanny, MD as PCP - Cyndia Diver, MD (Cardiology) Gaye Pollack, MD (Cardiothoracic Surgery) Donnamae Jude, MD as Consulting Physician (Obstetrics and Gynecology) Jodi Marble, MD as Consulting Physician (Otolaryngology) Lia Hopping, DDS as Consulting Physician (Dentistry) Luberta Mutter, MD as Consulting Physician (Ophthalmology)    Assessment:     Visual Acuity Screening   Right eye Left eye Both eyes  Without correction:     With correction: 20/50-1 20/25 20/20-1  Hearing Screening Comments: Bilateral hearing aids   Exercise Activities and Dietary recommendations Current Exercise Habits: Structured exercise class, Type of exercise: strength training/weights;stretching;calisthenics, Time (Minutes): 30, Intensity: Moderate, Exercise limited by: None identified  Goals    . Weight < 181 lb (82.101 kg)          Target weight loss is 20 lbs. Starting 11/07/2016, I will continue to exercise for at least 30 min 3 days per week and to make healthy food choices.        Fall Risk Fall Risk  11/07/2016 09/15/2015 06/21/2014 01/15/2014 03/28/2012  Falls in the past year? No No No No -  Risk for fall due to : - - - - Impaired balance/gait   Depression Screen PHQ 2/9 Scores 11/07/2016 09/15/2015 01/15/2014 03/28/2012  PHQ - 2 Score 0 0 0 0     Cognitive Function MMSE - Mini Mental State Exam 11/07/2016 09/15/2015  Orientation to time 5 5  Orientation to Place 5 5  Registration 3 3  Attention/ Calculation 0 0  Recall 3 3  Language- name 2 objects 0 0  Language- repeat 1 1   Language- follow 3 step command 3 3  Language- read & follow direction 0 0  Write a sentence 0 0  Copy design 0 0  Total score 20 20       PLEASE NOTE: A Mini-Cog screen was completed. Maximum score is 20. A value of 0 denotes this part of Folstein MMSE was not completed or the patient failed this part of the Mini-Cog screening.   Mini-Cog Screening Orientation to Time - Max 5 pts Orientation to Place - Max  5 pts Registration - Max 3 pts Recall - Max 3 pts Language Repeat - Max 1 pts Language Follow 3 Step Command - Max 3 pts   Immunization History  Administered Date(s) Administered  . Influenza Split 03/27/2011, 03/25/2012  . Influenza Whole 03/07/2007, 02/17/2008, 03/08/2009, 03/20/2010  . Influenza,inj,Quad PF,36+ Mos 03/06/2013, 02/23/2014, 02/24/2016  . Influenza-Unspecified 02/27/2015  . Pneumococcal Conjugate-13 01/15/2014  . Pneumococcal Polysaccharide-23 03/08/2009  . Td 05/29/1995, 03/08/2009  . Zoster 08/12/2007   Screening Tests Health Maintenance  Topic Date Due  . PAP SMEAR  11/07/2048 (Originally 03/28/2014)  . INFLUENZA VACCINE  12/26/2016  . MAMMOGRAM  02/15/2017  . TETANUS/TDAP  03/09/2019  . DEXA SCAN  Completed  . PNA vac Low Risk Adult  Completed      Plan:     I have personally reviewed and addressed the Medicare Annual Wellness questionnaire and have noted the following in the patient's chart:  A. Medical and social history B. Use of alcohol, tobacco or illicit drugs  C. Current medications and supplements D. Functional ability and status E.  Nutritional status F.  Physical activity G. Advance directives H. List of other physicians I.  Hospitalizations, surgeries, and ER visits in previous 12 months J.  [place] to include hearing, vision, cognitive, depression L. Referrals and appointments - none  In addition, I have reviewed and discussed with patient certain preventive protocols, quality metrics, and best practice  recommendations. A written personalized care plan for preventive services as well as general preventive health recommendations were provided to patient.  See attached scanned questionnaire for additional information.   Signed,   Lindell Noe, MHA, BS, LPN Health Coach

## 2016-11-12 ENCOUNTER — Telehealth: Payer: Self-pay | Admitting: Family Medicine

## 2016-11-12 NOTE — Telephone Encounter (Signed)
Pt notified of Dr. Marliss Coots comments and instructions and verbalized understanding. Pt said she will just try to stay hydrated and update Dr. Glori Bickers about her sxs on Wednesday when she comes in for her CPE

## 2016-11-12 NOTE — Telephone Encounter (Signed)
We cannot do px diarrhea med w/o evaluation (it can worsen the situation)  immodium otc is ok as directed as long as it does not cause abd pain  Most importantly drink fluids to prevent dehydration (please rev signs and symptoms with her)  F/u if no improvement

## 2016-11-12 NOTE — Telephone Encounter (Signed)
Pt has CPX on 11/14/16 with Dr Glori Bickers.

## 2016-11-12 NOTE — Telephone Encounter (Signed)
Patient Name: [name]  DOB: December 19, 1933    Initial Comment constant diarrhea, uncontrollable at times, not eaten in 24 hrs, dehydrated    Nurse Assessment  Nurse: Raphael Gibney, RN, Vanita Ingles Date/Time (Eastern Time): 11/12/2016 9:11:27 AM  Confirm and document reason for call. If symptomatic, describe symptoms. ---Caller states she has had diarrhea since Friday. Diarrhea is uncontrollable. She is taking immodium. any food or liquids she takes results in diarrhea. Diarrhea is tan and she had 2 diarrhea stools that were green yesterday. Has had more than 6 diarrhea stools daily. she has been incontinent of stool several times. No fever. No pain. She is urinating. urine is dark. Has not anything to drink for 24 hrs.  Does the patient have any new or worsening symptoms? ---Yes  Will a triage be completed? ---Yes  Related visit to physician within the last 2 weeks? ---No  Does the PT have any chronic conditions? (i.e. diabetes, asthma, etc.) ---No  Is this a behavioral health or substance abuse call? ---No     Guidelines    Guideline Title Affirmed Question Affirmed Notes  Diarrhea [1] SEVERE diarrhea (e.g., 7 or more times / day more than normal) AND [2] age > 60 years    Final Disposition User   See Physician within 4 Hours (or PCP triage) Raphael Gibney, RN, Vera    Comments  No appts available available with in 4 hrs at [facility] or [place] does not want to go to urgent care or another office. She would like prescription for diarrhea called in. Please call pt back regarding prescription or medication   Referrals  GO TO FACILITY REFUSED   Disagree/Comply: Disagree  Disagree/Comply Reason: Disagree with instructions

## 2016-11-14 ENCOUNTER — Ambulatory Visit (INDEPENDENT_AMBULATORY_CARE_PROVIDER_SITE_OTHER): Payer: Medicare Other | Admitting: Family Medicine

## 2016-11-14 ENCOUNTER — Encounter: Payer: Self-pay | Admitting: Family Medicine

## 2016-11-14 VITALS — BP 124/66 | HR 71 | Temp 98.3°F | Ht 65.5 in | Wt 204.0 lb

## 2016-11-14 DIAGNOSIS — E7800 Pure hypercholesterolemia, unspecified: Secondary | ICD-10-CM

## 2016-11-14 DIAGNOSIS — E559 Vitamin D deficiency, unspecified: Secondary | ICD-10-CM

## 2016-11-14 DIAGNOSIS — I10 Essential (primary) hypertension: Secondary | ICD-10-CM

## 2016-11-14 DIAGNOSIS — Z1211 Encounter for screening for malignant neoplasm of colon: Secondary | ICD-10-CM

## 2016-11-14 DIAGNOSIS — M899 Disorder of bone, unspecified: Secondary | ICD-10-CM | POA: Diagnosis not present

## 2016-11-14 DIAGNOSIS — C50511 Malignant neoplasm of lower-outer quadrant of right female breast: Secondary | ICD-10-CM | POA: Diagnosis not present

## 2016-11-14 DIAGNOSIS — R7309 Other abnormal glucose: Secondary | ICD-10-CM | POA: Diagnosis not present

## 2016-11-14 DIAGNOSIS — R197 Diarrhea, unspecified: Secondary | ICD-10-CM | POA: Diagnosis not present

## 2016-11-14 DIAGNOSIS — C549 Malignant neoplasm of corpus uteri, unspecified: Secondary | ICD-10-CM

## 2016-11-14 DIAGNOSIS — M949 Disorder of cartilage, unspecified: Secondary | ICD-10-CM

## 2016-11-14 MED ORDER — HYDROCORTISONE 2.5 % RE CREA: 1.0000 "application " | TOPICAL_CREAM | Freq: Two times a day (BID) | RECTAL | 3 refills | Status: DC

## 2016-11-14 NOTE — Patient Instructions (Addendum)
We will do some stool tests for infection  immodium or kaopectate is ok over the counter if it does not make stomach hurt Keep drinking fluids !  Get your mammogram in the fall as planned    Take care of yourself

## 2016-11-14 NOTE — Progress Notes (Signed)
Subjective:    Patient ID: [name], female    DOB: 05-Oct-1933, 83 y.o.   MRN: [id number]  HPI  Here for annual f/u of chronic health problems   Wt Readings from Last 3 Encounters:  11/14/16 204 lb (92.5 kg)  11/07/16 206 lb 8 oz (93.7 kg)  10/15/16 209 lb 8 oz (95 kg)  down 5 lb  bmi 33.4   AMW 6/13 No concerns   Team health had called re diarrhea -- started last Friday   (the day before that, she ate at the [employer]) - wonders if she picked up something there  Took immodium -unsure if it helped  Improved last night and then worse this am - watery and soft  Having bm frequently - up to 6 times per day  Afraid to eat  No cramping No bloating  No n/v at all  No fever  Labs look ok -nl wbc and lytes  No hosp exposures  She has never had this before  Hemorrhoids are bothering her - she had one spot of blood on paper - needs refill of proctosol  Mammogram 9/17-neg  Personal hx of breast and endometrial cancer  Sees oncology yearly  (she was upset last time because they did not examine her) Declines further visits but will get mammograms  Self breast exam- no lumps   dexa 5/15-normal range D level 44.8 No falls or fractures  She walks with a cane when out of the house   Colonoscopy 3/10 hyperplastic polyps  No more colonoscopies planned  Does not want further screening    bp is stable today  No cp or palpitations or headaches or edema  No side effects to medicines  BP Readings from Last 3 Encounters:  11/14/16 124/66  11/07/16 132/72  10/15/16 140/70     Hx of hyperlipidemia Lab Results  Component Value Date   CHOL 185 11/07/2016   CHOL 203 (H) 09/15/2015   CHOL 172 01/08/2014   Lab Results  Component Value Date   HDL 56.00 11/07/2016   HDL 53.60 09/15/2015   HDL 46.70 01/08/2014   Lab Results  Component Value Date   LDLCALC 112 (H) 11/07/2016   LDLCALC 131 (H) 09/15/2015   LDLCALC 110 (H) 01/08/2014   Lab Results  Component Value Date    TRIG 88.0 11/07/2016   TRIG 91.0 09/15/2015   TRIG 75.0 01/08/2014   Lab Results  Component Value Date   CHOLHDL 3 11/07/2016   CHOLHDL 4 09/15/2015   CHOLHDL 4 01/08/2014   Lab Results  Component Value Date   LDLDIRECT 162.4 03/18/2012   LDLDIRECT 147.6 03/16/2010  keeping it in good control with diet  LDL is down  Good HDL  Eats a lot of roasted vegetables and fruit    Hx of hyperglycemia Lab Results  Component Value Date   HGBA1C 6.0 11/07/2016  up from 5.6 occ ice cream bars-overall she avoids sweets  Did get candy for birthday-only then    Lab Results  Component Value Date   WBC 5.4 11/07/2016   HGB 15.6 (H) 11/07/2016   HCT 44.8 11/07/2016   MCV 88.5 11/07/2016   PLT 151.0 11/07/2016     Chemistry      Component Value Date/Time   NA 137 11/07/2016 1044   NA 139 06/23/2015 1016   K 3.8 11/07/2016 1044   K 4.1 06/23/2015 1016   CL 99 11/07/2016 1044   CO2 32 11/07/2016 1044  CO2 31 (H) 06/23/2015 1016   BUN 15 11/07/2016 1044   BUN 16.4 06/23/2015 1016   CREATININE 0.77 11/07/2016 1044   CREATININE 0.80 02/20/2016 0824   CREATININE 0.8 06/23/2015 1016      Component Value Date/Time   CALCIUM 10.3 11/07/2016 1044   CALCIUM 10.2 06/23/2015 1016   ALKPHOS 43 11/07/2016 1044   ALKPHOS 52 06/23/2015 1016   AST 26 11/07/2016 1044   AST 28 06/23/2015 1016   ALT 33 11/07/2016 1044   ALT 37 06/23/2015 1016   BILITOT 0.8 11/07/2016 1044   BILITOT 0.65 06/23/2015 1016     Glucose 128 Lab Results  Component Value Date   TSH 1.17 11/07/2016    Patient Active Problem List   Diagnosis Date Noted  . Diarrhea 11/14/2016  . Urinary frequency 10/15/2016  . Urge incontinence 10/15/2016  . Nonallopathic lesion of lumbar region 03/28/2015  . Nonallopathic lesion of thoracic region 03/28/2015  . Encounter for Medicare annual wellness exam 01/15/2014  . Colon cancer screening 01/15/2014  . Risk for falls 03/25/2012  . Breast cancer of lower-outer  quadrant of right female breast ([place]) 09/20/2011  . Dyspepsia 07/03/2011  . Vitamin D deficiency 08/29/2009  . DEPENDENT EDEMA, LEGS 09/07/2008  . CONSTIPATION 07/30/2008  . HYPERGLYCEMIA 07/09/2008  . Malignant neoplasm of corpus uteri, except isthmus ([place]) 10/28/2007  . HEARING LOSS, LEFT EAR 10/15/2007  . HEMORRHOIDS, INTERNAL 10/15/2007  . EXTERNAL HEMORRHOIDS 10/15/2007  . ESOPHAGEAL SPASM 10/15/2007  . DIVERTICULOSIS, COLON 10/15/2007  . Hyperlipidemia 10/07/2007  . Essential hypertension 10/07/2007  . Disorder of bone and cartilage 06/28/2006  . CAROTID BRUITS, BILATERAL 11/25/2005  . AORTIC STENOSIS 10/26/2004  . HIATAL HERNIA 12/27/1995   Past Medical History:  Diagnosis Date  . Alcohol abuse, unspecified   . Aortic stenosis    s/p tissue AVR 05/2011  . Arthritis   . Breast CA ([place])    rt  . Diaphragmatic hernia without mention of obstruction or gangrene   . Diverticulosis of colon (without mention of hemorrhage)   . Dyskinesia of esophagus   . Family history of colonic polyps   . Heart murmur   . HLD (hyperlipidemia)   . HTN (hypertension)   . Internal hemorrhoids without mention of complication   . Malignant neoplasm of corpus uteri, except isthmus ([place])   . Other abnormal glucose   . Pain in joint, shoulder region   . Postmenopausal bleeding   . Tobacco use disorder   . Unspecified hearing loss    Past Surgical History:  Procedure Laterality Date  . ABDOMINAL HYSTERECTOMY    . ABDOMINAL HYSTERECTOMY    . AORTIC VALVE REPLACEMENT  06/13/2011   Procedure: AORTIC VALVE REPLACEMENT (AVR);  Surgeon: Gaye Pollack, MD;  Location: [facility];  Service: Open Heart Surgery;  Laterality: N/A;  . 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  . BREAST LUMPECTOMY  11/2009   DCIS, neg sentinel lymph node biopsy  . BREAST LUMPECTOMY    . CATARACT EXTRACTION     bil  . LAPAROSCOPY W/ TOTAL BILATERAL PELVIC AND PERI-AORTIC LYMPHADENECTOMY  11/04/2007  . PARAAORTIC LYMPHADENECTOMY    .  ROTATOR CUFF REPAIR  05/2008   right  . SHOULDER SURGERY    . TOTAL ABDOMINAL HYSTERECTOMY W/ BILATERAL SALPINGOOPHORECTOMY  11/04/07   salpingo-oophrectomy   Social History  Substance Use Topics  . Smoking status: Former Smoker    Packs/day: 1.00    Quit date: 03/28/2000  . Smokeless tobacco: Never  Used  . Alcohol use No   Family History  Problem Relation Age of Onset  . Colon polyps Sister        2010  . Heart disease Mother   . Stroke Father   . Heart failure Father   . Cancer Maternal Aunt        possible post menopausal breast cancer   Allergies  Allergen Reactions  . Calcium     REACTION: constipation  . Diovan [Valsartan]     REACTION: abdominal pain  . Ezetimibe     REACTION: fatigue  . Ramipril     REACTION: abdominal pain  . Simvastatin     REACTION: leg pain and body aches   Current Outpatient Prescriptions on File Prior to Visit  Medication Sig Dispense Refill  . amLODipine (NORVASC) 5 MG tablet TAKE 1 TABLET (5 MG TOTAL) BY MOUTH DAILY. 90 tablet 3  . aspirin 325 MG tablet Take 162.5 mg by mouth daily. Take 1/2 tablet daily    . carvedilol (COREG) 25 MG tablet TAKE 1 TABLET (25 MG TOTAL) BY MOUTH 2 (TWO) TIMES DAILY WITH A MEAL. 180 tablet 2  . Cholecalciferol (VITAMIN D) 1000 UNITS capsule Take 1 capsule (1,000 Units total) by mouth 2 (two) times daily. 180 capsule 3  . diphenhydrAMINE (BENADRYL) 50 MG tablet Take 50 mg by mouth every 6 (six) hours as needed for itching or allergies.     . hydrochlorothiazide (HYDRODIURIL) 25 MG tablet TAKE 1 TABLET BY MOUTH EVERY DAY 90 tablet 3  . KLOR-CON M20 20 MEQ tablet TAKE 1 TABLET (20 MEQ TOTAL) BY MOUTH DAILY. 90 tablet 3  . Melatonin 3 MG CAPS Take 1 capsule by mouth at bedtime.    . Omega-3 Fatty Acids (FISH OIL) 1000 MG CAPS Take 2,000 mg by mouth daily.     . Psyllium (METAMUCIL PO) Take 1 Dose by mouth as directed.     Marland Kitchen spironolactone (ALDACTONE) 25 MG tablet TAKE 1 TABLET (25 MG TOTAL) BY MOUTH DAILY. 90  tablet 3   No current facility-administered medications on file prior to visit.     Review of Systems  Constitutional: Negative for activity change, appetite change, fatigue, fever and unexpected weight change.  HENT: Negative for congestion, ear pain, rhinorrhea, sinus pressure and sore throat.   Eyes: Negative for pain, redness and visual disturbance.  Respiratory: Negative for cough, shortness of breath and wheezing.   Cardiovascular: Negative for chest pain and palpitations.  Gastrointestinal: Positive for anal bleeding and diarrhea. Negative for abdominal distention, abdominal pain, blood in stool, constipation, nausea, rectal pain and vomiting.       Suspects hemorroids  Endocrine: Negative for polydipsia and polyuria.  Genitourinary: Negative for dysuria, frequency, hematuria and urgency.       Pos for urinary incontinence  Musculoskeletal: Negative for arthralgias, back pain and myalgias.  Skin: Negative for pallor and rash.  Allergic/Immunologic: Negative for environmental allergies.  Neurological: Negative for dizziness, syncope and headaches.  Hematological: Negative for adenopathy. Does not bruise/bleed easily.  Psychiatric/Behavioral: Negative for decreased concentration and dysphoric mood. The patient is not nervous/anxious.        Objective:   Physical Exam  Constitutional: She appears well-developed and well-nourished. No distress.  obese and well appearing   HENT:  Head: Normocephalic and atraumatic.  Right Ear: External ear normal.  Left Ear: External ear normal.  Nose: Nose normal.  Mouth/Throat: Oropharynx is clear and moist.  Eyes: Conjunctivae and EOM are normal. Pupils  are equal, round, and reactive to light. Right eye exhibits no discharge. Left eye exhibits no discharge. No scleral icterus.  Neck: Normal range of motion. Neck supple. No JVD present. Carotid bruit is not present. No thyromegaly present.  Cardiovascular: Normal rate, regular rhythm and  intact distal pulses.  Exam reveals no gallop.   Murmur heard. Pulmonary/Chest: Effort normal and breath sounds normal. No respiratory distress. She has no wheezes. She has no rales.  Abdominal: Soft. Bowel sounds are normal. She exhibits no distension and no mass. There is no tenderness.  Genitourinary:  Genitourinary Comments: Breast exam: No mass, nodules, thickening, tenderness, bulging, retraction, inflamation, nipple discharge or skin changes noted.  No axillary or clavicular LA.    Surgical changes or R breast noted baseline  Musculoskeletal: She exhibits no edema or tenderness.  Lymphadenopathy:    She has no cervical adenopathy.  Neurological: She is alert. She has normal reflexes. No cranial nerve deficit. She exhibits normal muscle tone. Coordination normal.  Skin: Skin is warm and dry. No rash noted. No erythema. No pallor.  Psychiatric: She has a normal mood and affect.          Assessment & Plan:   Problem List Items Addressed This Visit      Cardiovascular and Mediastinum   Essential hypertension - Primary    bp in fair control at this time  BP Readings from Last 1 Encounters:  11/14/16 124/66   No changes needed Disc lifstyle change with low sodium diet and exercise  Labs reviewed         Musculoskeletal and Integument   Disorder of bone and cartilage    Last dexa was in the nl range Disc need for calcium/ vitamin D/ wt bearing exercise and bone  Consider dexa in another year if she is open to it  No falls or fractures  Disc safety/ fracture risk in detail          Genitourinary   Malignant neoplasm of corpus uteri, except isthmus ([facility])    Pt does not think she will return to oncology for follow up at this  Point          Other   Breast cancer of lower-outer quadrant of right female breast ([place])    Pt does not want to continue to f/u with oncol at this point  She agrees to mammograms however Next one due in sept 2018- she is aware       Colon  cancer screening    Pt declines further screening (incl cologuard) at her age      Diarrhea    For over 5 days  Unsure if it was from eating out  No other assoc symptoms (except hemorroids) Able to stay hydrated No imp with immodium/ she will try keopectate with caution Stool test for c diff and cx today  Update if abd pain or new symptoms Update if not starting to improve in a week or if worsening        Relevant Orders   C. difficile GDH and Toxin A/B   Stool culture   HYPERGLYCEMIA    Lab Results  Component Value Date   HGBA1C 6.0 11/07/2016   Up from 5.6 Disc limiting sweets disc imp of low glycemic diet and wt loss to prevent DM2       Hyperlipidemia    Disc goals for lipids and reasons to control them Rev labs with pt Rev low sat fat diet in detail Diet  controlled  LDL is down to 112 Eating more vegetables       Vitamin D deficiency    Vitamin D level is therapeutic with current supplementation Disc importance of this to bone and overall health Level 44.8

## 2016-11-15 DIAGNOSIS — R197 Diarrhea, unspecified: Secondary | ICD-10-CM | POA: Diagnosis not present

## 2016-11-15 NOTE — Assessment & Plan Note (Signed)
Pt does not think she will return to oncology for follow up at this  [facility]

## 2016-11-15 NOTE — Assessment & Plan Note (Signed)
Pt declines further screening (incl cologuard) at her age

## 2016-11-15 NOTE — Assessment & Plan Note (Signed)
bp in fair control at this time  BP Readings from Last 1 Encounters:  11/14/16 124/66   No changes needed Disc lifstyle change with low sodium diet and exercise  Labs reviewed

## 2016-11-15 NOTE — Assessment & Plan Note (Signed)
Last dexa was in the nl range Disc need for calcium/ vitamin D/ wt bearing exercise and bone  Consider dexa in another year if she is open to it  No falls or fractures  Disc safety/ fracture risk in detail

## 2016-11-15 NOTE — Assessment & Plan Note (Signed)
For over 5 days  Unsure if it was from eating out  No other assoc symptoms (except hemorroids) Able to stay hydrated No imp with immodium/ she will try keopectate with caution Stool test for c diff and cx today  Update if abd pain or new symptoms Update if not starting to improve in a week or if worsening

## 2016-11-15 NOTE — Assessment & Plan Note (Signed)
Disc goals for lipids and reasons to control them Rev labs with pt Rev low sat fat diet in detail Diet controlled  LDL is down to 112 Eating more vegetables

## 2016-11-15 NOTE — Assessment & Plan Note (Signed)
Lab Results  Component Value Date   HGBA1C 6.0 11/07/2016   Up from 5.6 Disc limiting sweets disc imp of low glycemic diet and wt loss to prevent DM2

## 2016-11-15 NOTE — Assessment & Plan Note (Signed)
Vitamin D level is therapeutic with current supplementation Disc importance of this to bone and overall health Level 44.8

## 2016-11-15 NOTE — Assessment & Plan Note (Signed)
Pt does not want to continue to f/u with oncol at this point  She agrees to mammograms however Next one due in sept 2018- she is aware

## 2016-11-16 LAB — C. DIFFICILE GDH AND TOXIN A/B
C. DIFF TOXIN A/B: NOT DETECTED
C. difficile GDH: NOT DETECTED

## 2016-11-19 ENCOUNTER — Telehealth: Payer: Self-pay

## 2016-11-19 LAB — STOOL CULTURE

## 2016-11-19 NOTE — Telephone Encounter (Signed)
I'm confused - team health call says positive C diff test, however reviewing chart she had negative C diff test last week, no records of positive or mention of repeat testing. Can we call [facility] today about discrepancy? Thanks.

## 2016-11-19 NOTE — Telephone Encounter (Signed)
PLEASE NOTE: All timestamps contained within this report are represented as [place] Standard Time. CONFIDENTIALTY NOTICE: This fax transmission is intended only for the addressee. It contains information that is legally privileged, confidential or otherwise protected from use or disclosure. If you are not the intended recipient, you are strictly prohibited from reviewing, disclosing, copying using or disseminating any of this information or taking any action in reliance on or regarding this information. If you have received this fax in error, please notify us immediately by telephone so that we can arrange for its return to [place]. Phone: [phone], Toll-Free: [phone], Fax: [phone] Page: 1 of 2 Call Id: [id number] Indios Patient Name: [name] Gender: Female DOB: 08/19/1933 Age: 83 Y 74 M Return Phone Number: [phone] (Primary) City/State/Zip: [place] Client [place] Night - Client Client Site [place] Physician Loura Pardon - MD Who Is Calling Lab Lab Name [facility] Phone Number [phone] Lab Tech Name [facility] Lab Reference Number Chief Complaint Lab Result (Critical or Stat) Call Type Lab Send to RN Reason for Call Report lab results Initial Comment Critical results Additional Comment Nurse Assessment Nurse: Sonda Primes, RN, Shawn Date/Time Eilene Ghazi Time): 11/16/2016 1:37:40 AM Is there an on-call provider listed? ---Yes Please list name of person reporting value (Lab Employee) and a contact number. ---[facility] [phone] Please document the following items: Lab name Lab value (read back to lab to verify) Reference range for lab value Date and time blood was drawn Collect time of birth for bilirubin results ---Positive c-diff (ref range - negative) Collected: 11/15/16 12:47pm Previous Results: none Please  collect the patient contact information from the lab. (name, phone number and address) ---[name] [phone] Guidelines Guideline Title Affirmed Question Disp. Time Eilene Ghazi Time) Disposition Final User 11/16/2016 1:46:43 AM Clinical Call Yes Henard, RN, [facility] Paging DoctorName Phone DateTime Action Result/Outcome Notes Billey Gosling - MD [phone] 11/16/2016 1:45:27 AM Doctor Paged Called On Call Provider - Colon Flattery - MD 11/16/2016 1:46:35 AM Message Result Spoke with On Call - General Spoke with on-call Dr. Quay Burow and informed her of critical lab - Positive c-diff (ref range - negative) Collected: 11/15/16 12:47pm Previous Results: PLEASE NOTE: All timestamps contained within this report are represented as [place] Standard Time. CONFIDENTIALTY NOTICE: This fax transmission is intended only for the addressee. It contains information that is legally privileged, confidential or otherwise protected from use or disclosure. If you are not the intended recipient, you are strictly prohibited from reviewing, disclosing, copying using or disseminating any of this information or taking any action in reliance on or regarding this information. If you have received this fax in error, please notify us immediately by telephone so that we can arrange for its return to [place]. Phone: [phone], Toll-Free: [phone], Fax: [phone] Page: 2 of 2 Call Id: [id number] Paging DoctorName Phone DateTime Action Result/Outcome Notes none - Pt [name]. No new orders recieved.

## 2016-11-19 NOTE — Telephone Encounter (Signed)
[facility] called back to confirm that this patient has a negative c diff.

## 2016-11-20 ENCOUNTER — Telehealth: Payer: Self-pay | Admitting: Radiology

## 2016-11-20 ENCOUNTER — Other Ambulatory Visit: Payer: Self-pay | Admitting: Family Medicine

## 2016-11-20 DIAGNOSIS — R197 Diarrhea, unspecified: Secondary | ICD-10-CM

## 2016-11-20 NOTE — Telephone Encounter (Signed)
Agree. There is some confusion as to whether there may have been a positive c diff test based on team health call. Will just request repeat testing free to patient.

## 2016-11-20 NOTE — Telephone Encounter (Signed)
Noted. Thanks. Will cc PCP as fyi as well as Erin to be aware of erroneous critical lab call from [employer].

## 2016-11-20 NOTE — Addendum Note (Signed)
Addended by: Loura Pardon A on: 11/20/2016 07:22 PM   Modules accepted: Orders

## 2016-11-20 NOTE — Telephone Encounter (Signed)
I also went forward with the GI consult (aware we are repeating the c diff test) Will route to [facility]

## 2016-11-20 NOTE — Telephone Encounter (Signed)
Called to see how the pt was doing, she still has diarrhea. She said that Dr Marliss Coots nurse called and that Dr Glori Bickers wanted her referred to GI. Per Dr Danise Mina, he wants to repeat the c diff asap. Patient notified.

## 2016-11-20 NOTE — Telephone Encounter (Signed)
I think Leafy Ro is also in the loop (from before I left town) Thanks

## 2016-11-21 ENCOUNTER — Other Ambulatory Visit: Payer: Medicare Other

## 2016-11-21 DIAGNOSIS — R197 Diarrhea, unspecified: Secondary | ICD-10-CM | POA: Diagnosis not present

## 2016-11-22 ENCOUNTER — Telehealth: Payer: Self-pay | Admitting: Family Medicine

## 2016-11-22 ENCOUNTER — Encounter: Payer: Self-pay | Admitting: Gastroenterology

## 2016-11-22 LAB — C. DIFFICILE GDH AND TOXIN A/B
C. difficile GDH: NOT DETECTED
C. difficile Toxin A/B: NOT DETECTED

## 2016-11-22 NOTE — Telephone Encounter (Signed)
Spoke to pt today she wanted to let you know she is doing better, but she still is not eating much    Pt stated she has seen dr stark @ [facility]. I put on workque for them to call pt to schedule.

## 2016-11-22 NOTE — Telephone Encounter (Signed)
Slowly advance diet as tolerated and keep me posted, thanks

## 2016-11-22 NOTE — Telephone Encounter (Signed)
Great to hear. Thanks.  

## 2016-11-23 NOTE — Telephone Encounter (Signed)
Pt said as soon as she left the message that she was feeling better the diarrhea and abd pain returned and it's worse then before, pt said the diarrhea started around 6:30pm last night and it's been going on since then. Pt said that the pain is in her lower left side of her abd, pt is concerned because her GI appt isn't until 01/14/17 and she said there is no way she can go 2 more months with the GI issues she is having and wants to ask Dr. Glori Bickers what should she do. Pt is aware that Dr. Glori Bickers is out of the office and if she doesn't get a call back today she is okay she will just watch her sxs over the weekend or until Dr. Glori Bickers responds

## 2016-11-27 ENCOUNTER — Telehealth: Payer: Self-pay | Admitting: Gastroenterology

## 2016-11-27 NOTE — Telephone Encounter (Signed)
Called [facility], unfortunately there are no sooner appointments available. They are short staffed by 2 Providers there. I asked if they would send a message to Dr Fuller Plan or the Nurse with any recommendations for [name]. They will ask Sherri R.N. to call the patient with what she should do until 8/20. Called the patient and told her to be expecting a call from GI and said to call us back if her problem continues after receiving advice and following what they recommend.

## 2016-11-27 NOTE — Telephone Encounter (Signed)
I will check with [facility] and see if she can be seen any earlier by an extender if possible ? Thank s  [name]- can she be seen earlier for acute on chronic diarrhea?  Thanks

## 2016-11-27 NOTE — Telephone Encounter (Signed)
Patient has been scheduled for a sooner appointment with APP on 12/04/16. Patient has been notified of this.

## 2016-11-27 NOTE — Telephone Encounter (Signed)
Can see APP if she is willing.  No eval until seen in the office.

## 2016-11-27 NOTE — Telephone Encounter (Signed)
Thanks so much  I will keep an eye out for that -much appreciated

## 2016-11-27 NOTE — Telephone Encounter (Signed)
Pt request cb with Dr Glori Bickers suggestion of what to do about still having diarrhea. See previous note.

## 2016-11-28 NOTE — Telephone Encounter (Signed)
Looks like she got an appt on the 10th with GI-thanks  Keep me posted

## 2016-12-04 ENCOUNTER — Ambulatory Visit (INDEPENDENT_AMBULATORY_CARE_PROVIDER_SITE_OTHER): Payer: Medicare Other | Admitting: Physician Assistant

## 2016-12-04 ENCOUNTER — Other Ambulatory Visit: Payer: Medicare Other

## 2016-12-04 ENCOUNTER — Encounter: Payer: Self-pay | Admitting: Physician Assistant

## 2016-12-04 VITALS — BP 118/70 | HR 72 | Ht 65.5 in | Wt 197.2 lb

## 2016-12-04 DIAGNOSIS — R197 Diarrhea, unspecified: Secondary | ICD-10-CM | POA: Diagnosis not present

## 2016-12-04 DIAGNOSIS — R634 Abnormal weight loss: Secondary | ICD-10-CM | POA: Diagnosis not present

## 2016-12-04 MED ORDER — DIPHENOXYLATE-ATROPINE 2.5-0.025 MG PO TABS: ORAL_TABLET | ORAL | 1 refills | Status: DC

## 2016-12-04 NOTE — Patient Instructions (Addendum)
Please go to the basement level to our lab for the stool tests.   We have sent the following medications to your pharmacy for you to pick up at your convenience: [place].  1. Lomotil tablets for diarrhea.  We have provided you with a LOW  FODMAP diet.   If you are age 81 or older, your body mass index should be between 23-30. Your Body mass index is 32.32 kg/m. If this is out of the aforementioned range listed, please consider follow up with your Primary Care Provider.

## 2016-12-04 NOTE — Progress Notes (Signed)
Chief Complaint: Diarrhea  HPI:  [name] is an 83 year old Caucasian female with a past medical history of hyperlipidemia, hypertension, breast cancer and multiple others listed below, who regularly sees Dr. Fuller Plan and was referred to me by Abner Greenspan, MD for a complaint of diarrhea .      Patient's last colonoscopy was completed 08/06/08 by Dr. Fuller Plan with findings of moderate diverticulosis in the ascending colon to sigmoid colon, a 3 mm sessile polyp in the rectum and internal hemorrhoids. Pathology returned hyperplastic polyp and patient was told to have repeat colonoscopy in 10 years if necessary.   Per review of chart patient recently had a C. difficile test on 11/21/16 which was negative as well as a stool culture on 11/15/16 which was negative.   Today, the patient tells me that at 10:30 PM on 11/09/16 she started with explosive diarrhea. She tells me that this "went all over my walls and clothes". She adds that this had a terrible odor and she continued with at least 8 episodes of diarrhea that night, this continued into the next few days and resulted in a general decrease in appetite. The patient then started "watching what I was eating, mainly bland food", and went to see her PCP who did stool studies as above. Patient tells me that when she went on a bland food diet her symptoms seem to slow down but started again recently when she went to a cookout for the Fourth of July. Currently, within the past few days the patient had 2 normal solid stools yesterday and one solid stool was morning. Patient tells me she is worried to eat anything as she doesn't know what will cause symptoms. She does report that before her symptoms started initially after she ate at a [employer]. Patient has had a weight loss of 12 pounds over the past month.   Patient denies fever, chills, blood in her stool, melena, change in medications, recent antibiotics, nausea, vomiting or abdominal pain.  Past Medical  History:  Diagnosis Date  . Alcohol abuse, unspecified   . Aortic stenosis    s/p tissue AVR 05/2011  . Arthritis   . Breast CA ([place])    rt  . Diaphragmatic hernia without mention of obstruction or gangrene   . Diverticulosis of colon (without mention of hemorrhage)   . Dyskinesia of esophagus   . Family history of colonic polyps   . Heart murmur   . HLD (hyperlipidemia)   . HTN (hypertension)   . Internal hemorrhoids without mention of complication   . Malignant neoplasm of corpus uteri, except isthmus ([place])   . Other abnormal glucose   . Pain in joint, shoulder region   . Postmenopausal bleeding   . Tobacco use disorder   . Unspecified hearing loss     Past Surgical History:  Procedure Laterality Date  . ABDOMINAL HYSTERECTOMY    . ABDOMINAL HYSTERECTOMY    . AORTIC VALVE REPLACEMENT  06/13/2011   Procedure: AORTIC VALVE REPLACEMENT (AVR);  Surgeon: Gaye Pollack, MD;  Location: [facility];  Service: Open Heart Surgery;  Laterality: N/A;  . 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  . BREAST LUMPECTOMY  11/2009   DCIS, neg sentinel lymph node biopsy  . BREAST LUMPECTOMY    . CATARACT EXTRACTION     bil  . LAPAROSCOPY W/ TOTAL BILATERAL PELVIC AND PERI-AORTIC LYMPHADENECTOMY  11/04/2007  . PARAAORTIC LYMPHADENECTOMY    . ROTATOR CUFF REPAIR  05/2008  right  . SHOULDER SURGERY    . TOTAL ABDOMINAL HYSTERECTOMY W/ BILATERAL SALPINGOOPHORECTOMY  11/04/07   salpingo-oophrectomy    Current Outpatient Prescriptions  Medication Sig Dispense Refill  . amLODipine (NORVASC) 5 MG tablet TAKE 1 TABLET (5 MG TOTAL) BY MOUTH DAILY. 90 tablet 3  . aspirin 325 MG tablet Take 162.5 mg by mouth daily. Take 1/2 tablet daily    . carvedilol (COREG) 25 MG tablet TAKE 1 TABLET (25 MG TOTAL) BY MOUTH 2 (TWO) TIMES DAILY WITH A MEAL. 180 tablet 2  . Cholecalciferol (VITAMIN D) 1000 UNITS capsule Take 1 capsule (1,000 Units total) by mouth 2 (two) times daily. 180 capsule 3  . diphenhydrAMINE (BENADRYL) 50 MG  tablet Take 50 mg by mouth every 6 (six) hours as needed for itching or allergies.     . hydrochlorothiazide (HYDRODIURIL) 25 MG tablet TAKE 1 TABLET BY MOUTH EVERY DAY 90 tablet 3  . hydrocortisone (ANUSOL-HC) 2.5 % rectal cream Place 1 application rectally 2 (two) times daily. 30 g 3  . KLOR-CON M20 20 MEQ tablet TAKE 1 TABLET (20 MEQ TOTAL) BY MOUTH DAILY. 90 tablet 3  . Melatonin 3 MG CAPS Take 1 capsule by mouth at bedtime.    . Omega-3 Fatty Acids (FISH OIL) 1000 MG CAPS Take 2,000 mg by mouth daily.     . Psyllium (METAMUCIL PO) Take 1 Dose by mouth as directed.     Marland Kitchen spironolactone (ALDACTONE) 25 MG tablet TAKE 1 TABLET (25 MG TOTAL) BY MOUTH DAILY. 90 tablet 3  . diphenoxylate-atropine (LOMOTIL) 2.5-0.025 MG tablet Take 1-2 tablets every 4-6 hours for diarrhea. 30 tablet 1   No current facility-administered medications for this visit.     Allergies as of 12/04/2016 - Review Complete 12/04/2016  Allergen Reaction Noted  . Calcium  09/21/2009  . Diovan [valsartan]    . Ezetimibe    . Ramipril    . Simvastatin  12/07/2009    Family History  Problem Relation Age of Onset  . Colon polyps Sister        2010  . Heart disease Mother   . Stroke Father   . Heart failure Father   . Cancer Maternal Aunt        possible post menopausal breast cancer    Social History   Social History  . Marital status: Married    Spouse name: N/A  . Number of children: N/A  . Years of education: N/A   Occupational History  . Not on file.   Social History Main Topics  . Smoking status: Former Smoker    Packs/day: 1.00    Quit date: 03/28/2000  . Smokeless tobacco: Never Used  . Alcohol use No  . Drug use: No  . Sexual activity: No   Other Topics Concern  . Not on file   Social History Narrative  . No narrative on file    Review of Systems:    Constitutional: No fever or chills Skin: No rash Cardiovascular: No chest pain Respiratory: No SOB  Gastrointestinal: See HPI and  otherwise negative Genitourinary: No dysuria Neurological: No headache Musculoskeletal: No new muscle or joint pain Hematologic: No bleeding  Psychiatric: No history of depression or anxiety   Physical Exam:  Vital signs: BP 118/70   Pulse 72   Ht 5' 5.5" (1.664 m)   Wt 197 lb 4 oz (89.5 kg)   LMP 05/29/2007   BMI 32.32 kg/m   Constitutional:   Pleasant Elderly Caucasian  female appears to be in NAD, Well developed, Well nourished, alert and cooperative Head:  Normocephalic and atraumatic. Eyes:   PEERL, EOMI. No icterus. Conjunctiva pink. Ears:  Normal auditory acuity. Neck:  Supple Throat: Oral cavity and pharynx without inflammation, swelling or lesion.  Respiratory: Respirations even and unlabored. Lungs clear to auscultation bilaterally.   No wheezes, crackles, or rhonchi.  Cardiovascular: Normal S1, S2. No MRG. Regular rate and rhythm. No peripheral edema, cyanosis or pallor.  Gastrointestinal:  Soft, nondistended, nontender. No rebound or guarding. Normal bowel sounds. No appreciable masses or hepatomegaly. Rectal:  Not performed.  Msk:  Symmetrical without gross deformities. Without edema, no deformity or joint abnormality.  Neurologic:  Alert and  oriented x4;  grossly normal neurologically.  Skin:   Dry and intact without significant lesions or rashes. Psychiatric:  Demonstrates good judgement and reason without abnormal affect or behaviors.  MOST RECENT LABS AND IMAGING: CBC    Component Value Date/Time   WBC 5.4 11/07/2016 1044   RBC 5.06 11/07/2016 1044   HGB 15.6 (H) 11/07/2016 1044   HGB 15.2 06/23/2015 1016   HCT 44.8 11/07/2016 1044   HCT 44.0 06/23/2015 1016   PLT 151.0 11/07/2016 1044   PLT 134 (L) 06/23/2015 1016   MCV 88.5 11/07/2016 1044   MCV 88.0 06/23/2015 1016   MCH 30.4 06/23/2015 1016   MCH 29.9 06/16/2011 0610   MCHC 34.9 11/07/2016 1044   RDW 13.6 11/07/2016 1044   RDW 13.0 06/23/2015 1016   LYMPHSABS 1.0 11/07/2016 1044   LYMPHSABS 1.1  06/23/2015 1016   MONOABS 0.4 11/07/2016 1044   MONOABS 0.6 06/23/2015 1016   EOSABS 0.3 11/07/2016 1044   EOSABS 0.3 06/23/2015 1016   BASOSABS 0.0 11/07/2016 1044   BASOSABS 0.0 06/23/2015 1016    CMP     Component Value Date/Time   NA 137 11/07/2016 1044   NA 139 06/23/2015 1016   K 3.8 11/07/2016 1044   K 4.1 06/23/2015 1016   CL 99 11/07/2016 1044   CO2 32 11/07/2016 1044   CO2 31 (H) 06/23/2015 1016   GLUCOSE 128 (H) 11/07/2016 1044   GLUCOSE 100 06/23/2015 1016   GLUCOSE 99 05/13/2006 0933   BUN 15 11/07/2016 1044   BUN 16.4 06/23/2015 1016   CREATININE 0.77 11/07/2016 1044   CREATININE 0.80 02/20/2016 0824   CREATININE 0.8 06/23/2015 1016   CALCIUM 10.3 11/07/2016 1044   CALCIUM 10.2 06/23/2015 1016   PROT 7.0 11/07/2016 1044   PROT 6.9 06/23/2015 1016   ALBUMIN 4.5 11/07/2016 1044   ALBUMIN 3.9 06/23/2015 1016   AST 26 11/07/2016 1044   AST 28 06/23/2015 1016   ALT 33 11/07/2016 1044   ALT 37 06/23/2015 1016   ALKPHOS 43 11/07/2016 1044   ALKPHOS 52 06/23/2015 1016   BILITOT 0.8 11/07/2016 1044   BILITOT 0.65 06/23/2015 1016   GFRNONAA 89 (L) 06/16/2011 0610   GFRAA >90 06/16/2011 0610    Assessment: 1. Diarrhea: started almost a month ago, explosive in nature, tends to start at night around 8 pm with 8-10 stools into the night, better with bland diet recently, cdiff negative, stool culture negative; Consider infectious cause vs post-viral IBS vs other 2. Weight loss: 12 pounds, likely due to change in diet with above  Plan: 1. Ordered GI pathogen panel and fecal lactoferrin, explained that the patient should only return her stool if it was his liquid 2. Recommend the patient abide by a partial low FODMAP diet,  gave her handout and instructed her to mainly stay on the "do eat" side 3. If symptoms continue patient could add in a probiotic 4. Prescribed Lomotil 1-2 tabs every 4-6 hours for diarrhea #30 with 1 refill 5. Patient to follow in clinic in 3-4  weeks with me or Dr. Royston Cowper, PA-C [facility] 12/04/2016, 12:23 PM  Cc: Tower, Wynelle Fanny, MD

## 2016-12-04 NOTE — Progress Notes (Signed)
Reviewed and agree with initial management plan.  Malcolm T. Stark, MD FACG 

## 2017-01-14 ENCOUNTER — Ambulatory Visit: Payer: Self-pay | Admitting: Gastroenterology

## 2017-01-15 DIAGNOSIS — Z23 Encounter for immunization: Secondary | ICD-10-CM | POA: Diagnosis not present

## 2017-02-03 ENCOUNTER — Other Ambulatory Visit: Payer: Self-pay | Admitting: Cardiovascular Disease

## 2017-03-06 ENCOUNTER — Emergency Department (HOSPITAL_COMMUNITY)
Admission: EM | Admit: 2017-03-06 | Discharge: 2017-03-07 | Disposition: A | Discharge status: Home / Self Care | Payer: Medicare Other | Attending: Emergency Medicine | Admitting: Emergency Medicine

## 2017-03-06 ENCOUNTER — Encounter (HOSPITAL_COMMUNITY): Payer: Self-pay | Admitting: Pharmacy Technician

## 2017-03-06 DIAGNOSIS — R404 Transient alteration of awareness: Secondary | ICD-10-CM | POA: Diagnosis not present

## 2017-03-06 DIAGNOSIS — C540 Malignant neoplasm of isthmus uteri: Secondary | ICD-10-CM | POA: Diagnosis not present

## 2017-03-06 DIAGNOSIS — Z853 Personal history of malignant neoplasm of breast: Secondary | ICD-10-CM | POA: Insufficient documentation

## 2017-03-06 DIAGNOSIS — I10 Essential (primary) hypertension: Secondary | ICD-10-CM | POA: Insufficient documentation

## 2017-03-06 DIAGNOSIS — R42 Dizziness and giddiness: Secondary | ICD-10-CM | POA: Diagnosis not present

## 2017-03-06 DIAGNOSIS — Z87891 Personal history of nicotine dependence: Secondary | ICD-10-CM | POA: Insufficient documentation

## 2017-03-06 DIAGNOSIS — Z79899 Other long term (current) drug therapy: Secondary | ICD-10-CM | POA: Diagnosis not present

## 2017-03-06 DIAGNOSIS — R531 Weakness: Secondary | ICD-10-CM | POA: Diagnosis not present

## 2017-03-06 DIAGNOSIS — Z7982 Long term (current) use of aspirin: Secondary | ICD-10-CM | POA: Insufficient documentation

## 2017-03-06 LAB — CBC
HEMATOCRIT: 41.6 % (ref 36.0–46.0)
Hemoglobin: 14.6 g/dL (ref 12.0–15.0)
MCH: 30.4 pg (ref 26.0–34.0)
MCHC: 35.1 g/dL (ref 30.0–36.0)
MCV: 86.7 fL (ref 78.0–100.0)
Platelets: 121 10*3/uL — ABNORMAL LOW (ref 150–400)
RBC: 4.8 MIL/uL (ref 3.87–5.11)
RDW: 13.2 % (ref 11.5–15.5)
WBC: 8.7 10*3/uL (ref 4.0–10.5)

## 2017-03-06 LAB — BASIC METABOLIC PANEL
Anion gap: 8 (ref 5–15)
BUN: 14 mg/dL (ref 6–20)
CALCIUM: 9.6 mg/dL (ref 8.9–10.3)
CO2: 29 mmol/L (ref 22–32)
Chloride: 100 mmol/L — ABNORMAL LOW (ref 101–111)
Creatinine, Ser: 1.08 mg/dL — ABNORMAL HIGH (ref 0.44–1.00)
GFR calc Af Amer: 54 mL/min — ABNORMAL LOW (ref >60)
GFR, EST NON AFRICAN AMERICAN: 47 mL/min — AB (ref >60)
GLUCOSE: 146 mg/dL — AB (ref 65–99)
Potassium: 3.4 mmol/L — ABNORMAL LOW (ref 3.5–5.1)
Sodium: 137 mmol/L (ref 135–145)

## 2017-03-06 MED ORDER — MECLIZINE HCL 25 MG PO TABS
25.0000 mg | ORAL_TABLET | Freq: Once | ORAL | Status: AC
  Administered 2017-03-07: 25 mg via ORAL
  Filled 2017-03-06: qty 1

## 2017-03-06 MED ORDER — ONDANSETRON HCL 4 MG/2ML IJ SOLN
4.0000 mg | Freq: Once | INTRAMUSCULAR | Status: AC
  Administered 2017-03-07: 4 mg via INTRAVENOUS
  Filled 2017-03-06: qty 2

## 2017-03-06 MED ORDER — SODIUM CHLORIDE 0.9 % IV BOLUS (SEPSIS)
500.0000 mL | Freq: Once | INTRAVENOUS | Status: AC
  Administered 2017-03-07: 500 mL via INTRAVENOUS

## 2017-03-06 NOTE — ED Triage Notes (Signed)
Pt arrives via [facility] with reports of sudden onset of dizziness along with nausea and vomiting after waking up from nap at 2030 today. Pt states it feels like when she was "sea sick". Pt with 3 episodes of emesis. VSS. 4mg  zofran given en route.

## 2017-03-06 NOTE — ED Notes (Signed)
Family at bedside. 

## 2017-03-06 NOTE — ED Provider Notes (Signed)
TIME SEEN: 11:15 PM  CHIEF COMPLAINT: Vertigo  HPI: Patient is an 83 year old female with history of aortic valve replacement on aspirin, hypertension, hyperlipidemia who presents emergency department with vertigo. States she fell sleep in her recliner around 7:30 PM tonight and when she woke up just prior to arrival she had dizziness like the room was moving. She felt like she was "sea sick". She was unable to ambulate. She states she had 3 episodes of vomiting. She still feels nauseated. Symptoms worse when she turns her head back and forth. She does not feel she can walk at this time. No headache, head injury. No numbness, tingling or focal weakness. No chest pain or shortness of breath. Has chronic hearing loss and wears hearing aids. No new hearing loss, ear pain or tinnitus. No fever.  ROS: See HPI Constitutional: no fever  Eyes: no drainage  ENT: no runny nose   Cardiovascular:  no chest pain  Resp: no SOB  GI: no vomiting GU: no dysuria Integumentary: no rash  Allergy: no hives  Musculoskeletal: no leg swelling  Neurological: no slurred speech ROS otherwise negative  PAST MEDICAL HISTORY/PAST SURGICAL HISTORY:  Past Medical History:  Diagnosis Date  . Alcohol abuse, unspecified   . Aortic stenosis    s/p tissue AVR 05/2011  . Arthritis   . Breast CA ([place])    rt  . Diaphragmatic hernia without mention of obstruction or gangrene   . Diverticulosis of colon (without mention of hemorrhage)   . Dyskinesia of esophagus   . Family history of colonic polyps   . Heart murmur   . HLD (hyperlipidemia)   . HTN (hypertension)   . Internal hemorrhoids without mention of complication   . Malignant neoplasm of corpus uteri, except isthmus ([place])   . Other abnormal glucose   . Pain in joint, shoulder region   . Postmenopausal bleeding   . Tobacco use disorder   . Unspecified hearing loss     MEDICATIONS:  Prior to Admission medications   Medication Sig Start Date End Date Taking?  Authorizing Provider  amLODipine (NORVASC) 5 MG tablet TAKE 1 TABLET (5 MG TOTAL) BY MOUTH DAILY. 03/19/16   Sherren Mocha, MD  aspirin 325 MG tablet Take 162.5 mg by mouth daily. Take 1/2 tablet daily    [provider]  carvedilol (COREG) 25 MG tablet Take 1 tablet (25 mg total) by mouth 2 (two) times daily with a meal. 02/04/17   Sherren Mocha, MD  Cholecalciferol (VITAMIN D) 1000 UNITS capsule Take 1 capsule (1,000 Units total) by mouth 2 (two) times daily. 01/15/14   Tower, Wynelle Fanny, MD  diphenhydrAMINE (BENADRYL) 50 MG tablet Take 50 mg by mouth every 6 (six) hours as needed for itching or allergies.     [provider]  diphenoxylate-atropine (LOMOTIL) 2.5-0.025 MG tablet Take 1-2 tablets every 4-6 hours for diarrhea. 12/04/16   Levin Erp, PA  hydrochlorothiazide (HYDRODIURIL) 25 MG tablet TAKE 1 TABLET BY MOUTH EVERY DAY 05/07/16   Sherren Mocha, MD  hydrocortisone (ANUSOL-HC) 2.5 % rectal cream Place 1 application rectally 2 (two) times daily. 11/14/16   Tower, Marne A, MD  KLOR-CON M20 20 MEQ tablet TAKE 1 TABLET (20 MEQ TOTAL) BY MOUTH DAILY. 04/24/16   Sherren Mocha, MD  Melatonin 3 MG CAPS Take 1 capsule by mouth at bedtime.    [provider]  Omega-3 Fatty Acids (FISH OIL) 1000 MG CAPS Take 2,000 mg by mouth daily.  [provider]  Psyllium (METAMUCIL PO) Take 1 Dose by mouth as directed.     [provider]  spironolactone (ALDACTONE) 25 MG tablet TAKE 1 TABLET (25 MG TOTAL) BY MOUTH DAILY. 02/27/16   Sherren Mocha, MD    ALLERGIES:  Allergies  Allergen Reactions  . Calcium     REACTION: constipation  . Diovan [Valsartan]     REACTION: abdominal pain  . Ezetimibe     REACTION: fatigue  . Ramipril     REACTION: abdominal pain  . Simvastatin     REACTION: leg pain and body aches    SOCIAL HISTORY:  Social History  Substance Use Topics  . Smoking status: Former Smoker    Packs/day: 1.00    Quit date:  03/28/2000  . Smokeless tobacco: Never Used  . Alcohol use No    FAMILY HISTORY: Family History  Problem Relation Age of Onset  . Colon polyps Sister        2010  . Heart disease Mother   . Stroke Father   . Heart failure Father   . Cancer Maternal Aunt        possible post menopausal breast cancer    EXAM: BP (!) 143/82 (BP Location: Right Arm)   Pulse 68   Temp 98.2 F (36.8 C) (Oral)   Resp 16   LMP 05/29/2007   SpO2 100%  CONSTITUTIONAL: Alert and oriented and responds appropriately to questions. Well-appearing; well-nourished, Elderly HEAD: Normocephalic EYES: Conjunctivae clear, pupils appear equal, EOMI ENT: normal nose; moist mucous membranes NECK: Supple, no meningismus, no nuchal rigidity, no LAD  CARD: RRR; S1 and S2 appreciated; no murmurs, no clicks, no rubs, no gallops RESP: Normal chest excursion without splinting or tachypnea; breath sounds clear and equal bilaterally; no wheezes, no rhonchi, no rales, no hypoxia or respiratory distress, speaking full sentences ABD/GI: Normal bowel sounds; non-distended; soft, non-tender, no rebound, no guarding, no peritoneal signs, no hepatosplenomegaly BACK:  The back appears normal and is non-tender to palpation, there is no CVA tenderness EXT: Normal ROM in all joints; non-tender to palpation; no edema; normal capillary refill; no cyanosis, no calf tenderness or swelling    SKIN: Normal color for age and race; warm; no rash NEURO: Moves all extremities equally; Strength 5/5 in all four extremities.  Normal sensation diffusely.  CN 2-12 grossly intact.  No dysmetria to finger to nose testing bilaterally.  Normal speech.   PSYCH: The patient's mood and manner are appropriate. Grooming and personal hygiene are appropriate.  MEDICAL DECISION MAKING: Patient here with vertigo. Symptoms improving. Because of that she would not be a TPA candidate. Her NIH stroke scale is 0 at this time. Still having some vertigo and nausea. Will  treat with IV fluids, meclizine, Zofran. Given she does have multiple risk factors for stroke, will obtain an MRI of her brain without contrast to rule out any central cause of her vertigo today. Patient and family comfortable with this plan. EKG shows no new ischemic abnormality or arrhythmia. Will also obtain basic labs to ensure no other organic cause of her symptoms such as anemia, electrolyte abnormality, dehydration.  ED PROGRESS: 2:45 AM  Pt reports her symptoms have completely resolved after medications. MRI of the brain shows no acute abdomen. She has been able to ambulate without assistance and able to drink fluids without difficulty. Suspect peripheral vertigo. Labs, urine otherwise unremarkable. However, close follow-up with her primary care provider Dr. Glori Bickers. We did discussed return precautions. We'll  discharge with prescriptions of meclizine and Zofran. Patient and son comfortable with this plan.  At this time, I do not feel there is any life-threatening condition present. I have reviewed and discussed all results (EKG, imaging, lab, urine as appropriate) and exam findings with patient/family. I have reviewed nursing notes and appropriate previous records.  I feel the patient is safe to be discharged home without further emergent workup and can continue workup as an outpatient as needed. Discussed usual and customary return precautions. Patient/family verbalize understanding and are comfortable with this plan.  Outpatient follow-up has been provided if needed. All questions have been answered.    EKG Interpretation  Date/Time:  Wednesday March 06 2017 23:39:05 EDT Ventricular Rate:  64 PR Interval:  176 QRS Duration: 138 QT Interval:  450 QTC Calculation: 464 R Axis:   -60 Text Interpretation:  Normal sinus rhythm Left axis deviation Left ventricular hypertrophy with QRS widening and repolarization abnormality Abnormal ECG St changes in lateral leads has improved compared to prior  Confirmed by Kaho Selle, Cyril Mourning [phone]) on 03/07/2017 12:04:31 AM          Zenda Herskowitz, Delice Bison, DO 03/07/17 8003

## 2017-03-07 ENCOUNTER — Encounter (HOSPITAL_COMMUNITY): Payer: Self-pay | Admitting: *Deleted

## 2017-03-07 ENCOUNTER — Emergency Department (HOSPITAL_COMMUNITY): Payer: Medicare Other

## 2017-03-07 DIAGNOSIS — R42 Dizziness and giddiness: Secondary | ICD-10-CM | POA: Diagnosis not present

## 2017-03-07 LAB — URINALYSIS, ROUTINE W REFLEX MICROSCOPIC
Bilirubin Urine: NEGATIVE
Glucose, UA: NEGATIVE mg/dL
Hgb urine dipstick: NEGATIVE
Ketones, ur: NEGATIVE mg/dL
NITRITE: NEGATIVE
Protein, ur: NEGATIVE mg/dL
SPECIFIC GRAVITY, URINE: 1.009 (ref 1.005–1.030)
pH: 7 (ref 5.0–8.0)

## 2017-03-07 MED ORDER — MECLIZINE HCL 25 MG PO TABS: 25.0000 mg | ORAL_TABLET | Freq: Three times a day (TID) | ORAL | 0 refills | Status: DC | PRN

## 2017-03-07 MED ORDER — ONDANSETRON 4 MG PO TBDP: 4.0000 mg | ORAL_TABLET | Freq: Three times a day (TID) | ORAL | 0 refills | Status: DC | PRN

## 2017-03-07 NOTE — ED Notes (Signed)
Pt tolerating PO fluids

## 2017-03-07 NOTE — ED Notes (Signed)
Pt able to ambulate in hallway with assistance. Pt usually uses cane to ambulate.

## 2017-03-07 NOTE — ED Notes (Signed)
Pt in MRI.

## 2017-03-12 ENCOUNTER — Other Ambulatory Visit: Payer: Self-pay

## 2017-03-12 ENCOUNTER — Ambulatory Visit (HOSPITAL_COMMUNITY): Payer: Medicare Other | Attending: Cardiovascular Disease

## 2017-03-12 DIAGNOSIS — Z79899 Other long term (current) drug therapy: Secondary | ICD-10-CM | POA: Diagnosis not present

## 2017-03-12 DIAGNOSIS — E785 Hyperlipidemia, unspecified: Secondary | ICD-10-CM | POA: Diagnosis not present

## 2017-03-12 DIAGNOSIS — Z853 Personal history of malignant neoplasm of breast: Secondary | ICD-10-CM | POA: Insufficient documentation

## 2017-03-12 DIAGNOSIS — I119 Hypertensive heart disease without heart failure: Secondary | ICD-10-CM | POA: Insufficient documentation

## 2017-03-12 DIAGNOSIS — I359 Nonrheumatic aortic valve disorder, unspecified: Secondary | ICD-10-CM | POA: Diagnosis present

## 2017-03-12 DIAGNOSIS — Z952 Presence of prosthetic heart valve: Secondary | ICD-10-CM | POA: Diagnosis not present

## 2017-03-21 ENCOUNTER — Ambulatory Visit (INDEPENDENT_AMBULATORY_CARE_PROVIDER_SITE_OTHER): Payer: Medicare Other | Admitting: Cardiovascular Disease

## 2017-03-21 ENCOUNTER — Encounter: Payer: Self-pay | Admitting: Cardiovascular Disease

## 2017-03-21 VITALS — BP 118/74 | HR 69 | Ht 65.51 in | Wt 195.4 lb

## 2017-03-21 DIAGNOSIS — R42 Dizziness and giddiness: Secondary | ICD-10-CM | POA: Diagnosis not present

## 2017-03-21 DIAGNOSIS — Z952 Presence of prosthetic heart valve: Secondary | ICD-10-CM

## 2017-03-21 DIAGNOSIS — E7800 Pure hypercholesterolemia, unspecified: Secondary | ICD-10-CM | POA: Diagnosis not present

## 2017-03-21 DIAGNOSIS — I10 Essential (primary) hypertension: Secondary | ICD-10-CM

## 2017-03-21 MED ORDER — ASPIRIN 81 MG PO TABS: 81.0000 mg | ORAL_TABLET | Freq: Every day | ORAL | Status: DC

## 2017-03-21 NOTE — Patient Instructions (Signed)
Medication Instructions:  1) DECREASE ASPIRIN to 81 mg daily   Labwork: None  Testing/Procedures: Your physician has requested that you have a carotid duplex. This test is an ultrasound of the carotid arteries in your neck. It looks at blood flow through these arteries that supply the brain with blood. Allow one hour for this exam. There are no restrictions or special instructions.  Follow-Up: Your provider wants you to follow-up in: 1 year with Dr. Burt Knack. You will receive a reminder letter in the mail two months in advance. If you don't receive a letter, please call our office to schedule the follow-up appointment.    Any Other Special Instructions Will Be Listed Below (If Applicable).     If you need a refill on your cardiac medications before your next appointment, please call your pharmacy.

## 2017-03-21 NOTE — Progress Notes (Signed)
Cardiology Office Note Date:  03/21/2017   ID:  [name], [name] 10-Apr-1934, MRN [id number]  PCP:  Abner Greenspan, MD  Cardiologist:  Sherren Mocha, MD    Chief Complaint  Patient presents with  . Follow-up     History of Present Illness: [name] is a 83 y.o. female who presents for Follow-up of aortic valve disease.  The patient has a history of aortic stenosis and underwent bioprosthetic aortic valve replacement in 2013.  She has been followed for hypertension and hyperlipidemia.  She had a recent echocardiogram which is reviewed with her today.  Overall she is doing fairly well.  She denies any recent issues with chest pain, chest pressure, shortness of breath, heart palpitations, orthopnea, or PND.  She reports problems with balance and her gait. She woke up 3 weeks and couldn't focus her eyes. She felt very weak all over. She has nausea and weakness. States she could barely stand up.  The patient was evaluated in the emergency department.  An MRI of the brain showed no acute changes.  She was found to have chronic white matter changes and cerebral atrophy.    Past Medical History:  Diagnosis Date  . Alcohol abuse, unspecified   . Aortic stenosis    s/p tissue AVR 05/2011  . Arthritis   . Breast CA ([place])    rt  . Diaphragmatic hernia without mention of obstruction or gangrene   . Diverticulosis of colon (without mention of hemorrhage)   . Dyskinesia of esophagus   . Family history of colonic polyps   . Heart murmur   . HLD (hyperlipidemia)   . HTN (hypertension)   . Internal hemorrhoids without mention of complication   . Malignant neoplasm of corpus uteri, except isthmus ([place])   . Other abnormal glucose   . Pain in joint, shoulder region   . Postmenopausal bleeding   . Tobacco use disorder   . Unspecified hearing loss     Past Surgical History:  Procedure Laterality Date  . ABDOMINAL HYSTERECTOMY    . ABDOMINAL HYSTERECTOMY    . AORTIC VALVE  REPLACEMENT  06/13/2011   Procedure: AORTIC VALVE REPLACEMENT (AVR);  Surgeon: Gaye Pollack, MD;  Location: [place];  Service: Open Heart Surgery;  Laterality: N/A;  . 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  . BREAST LUMPECTOMY  11/2009   DCIS, neg sentinel lymph node biopsy  . BREAST LUMPECTOMY    . CATARACT EXTRACTION     bil  . LAPAROSCOPY W/ TOTAL BILATERAL PELVIC AND PERI-AORTIC LYMPHADENECTOMY  11/04/2007  . PARAAORTIC LYMPHADENECTOMY    . ROTATOR CUFF REPAIR  05/2008   right  . SHOULDER SURGERY    . TOTAL ABDOMINAL HYSTERECTOMY W/ BILATERAL SALPINGOOPHORECTOMY  11/04/07   salpingo-oophrectomy    Current Outpatient Prescriptions  Medication Sig Dispense Refill  . amLODipine (NORVASC) 5 MG tablet TAKE 1 TABLET (5 MG TOTAL) BY MOUTH DAILY. 90 tablet 3  . amoxicillin (AMOXIL) 500 MG capsule Take 4 capsules by mouth as directed. 1 hour prior to dental procedure  2  . aspirin 325 MG tablet Take 162.5 mg by mouth daily. Take 1/2 tablet daily    . carvedilol (COREG) 25 MG tablet Take 1 tablet (25 mg total) by mouth 2 (two) times daily with a meal. 180 tablet 0  . Cholecalciferol (VITAMIN D) 1000 UNITS capsule Take 1 capsule (1,000 Units total) by mouth 2 (two) times daily. 180 capsule 3  . diphenhydrAMINE (  BENADRYL) 50 MG tablet Take 50 mg by mouth every 6 (six) hours as needed for itching or allergies.     . diphenoxylate-atropine (LOMOTIL) 2.5-0.025 MG tablet Take 1-2 tablets every 4-6 hours for diarrhea. 30 tablet 1  . hydrochlorothiazide (HYDRODIURIL) 25 MG tablet TAKE 1 TABLET BY MOUTH EVERY DAY 90 tablet 3  . hydrocortisone (ANUSOL-HC) 2.5 % rectal cream Place 1 application rectally 2 (two) times daily. 30 g 3  . KLOR-CON M20 20 MEQ tablet TAKE 1 TABLET (20 MEQ TOTAL) BY MOUTH DAILY. 90 tablet 3  . meclizine (ANTIVERT) 25 MG tablet Take 1 tablet (25 mg total) by mouth 3 (three) times daily as needed for dizziness. 30 tablet 0  . Melatonin 3 MG CAPS Take 1 capsule by mouth at bedtime.    . Omega-3  Fatty Acids (FISH OIL) 1000 MG CAPS Take 2,000 mg by mouth daily.     . ondansetron (ZOFRAN ODT) 4 MG disintegrating tablet Take 1 tablet (4 mg total) by mouth every 8 (eight) hours as needed for nausea or vomiting. 20 tablet 0  . Psyllium (METAMUCIL PO) Take 1 Dose by mouth as directed.     Marland Kitchen spironolactone (ALDACTONE) 25 MG tablet TAKE 1 TABLET (25 MG TOTAL) BY MOUTH DAILY. 90 tablet 3   No current facility-administered medications for this visit.     Allergies:   Calcium; Diovan [valsartan]; Ezetimibe; Ramipril; and Simvastatin   Social History:  The patient  reports that she quit smoking about 16 years ago. She smoked 1.00 pack per day. She has never used smokeless tobacco. She reports that she does not drink alcohol or use drugs.   Family History:  The patient's  family history includes Cancer in her maternal aunt; Colon polyps in her sister; Heart disease in her mother; Heart failure in her father; Stroke in her father.    ROS:  Please see the history of present illness.  Otherwise, review of systems is positive for balance problems, diarrhea, dizziness.  All other systems are reviewed and negative.    PHYSICAL EXAM: VS:  BP 118/74   Pulse 69   Ht 5' 5.51" (1.664 m)   Wt 195 lb 6.4 oz (88.6 kg)   LMP 05/29/2007   BMI 32.01 kg/m   , BMI Body mass index is 32.01 kg/m. GEN: Well nourished, well developed, in no acute distress  HEENT: normal  Neck: no JVD, no masses. No carotid bruits Cardiac: RRR with grade 2/6 systolic ejection murmur, early peaking, in the right upper sternal border          Respiratory:  clear to auscultation bilaterally, normal work of breathing GI: soft, nontender, nondistended, + BS MS: no deformity or atrophy  Ext: 1+ bilateral pretibial edema, pedal pulses 2+= bilaterally Skin: warm and dry, no rash Neuro:  Strength and sensation are intact Psych: euthymic mood, full affect  EKG:  EKG is ordered today. The ekg ordered today shows normal sinus rhythm  69 bpm, LVH with QRS widening.  Recent Labs: 11/07/2016: ALT 33; TSH 1.17 03/06/2017: BUN 14; Creatinine, Ser 1.08; Hemoglobin 14.6; Platelets 121; Potassium 3.4; Sodium 137   Lipid Panel     Component Value Date/Time   CHOL 185 11/07/2016 1044   TRIG 88.0 11/07/2016 1044   TRIG 58 05/13/2006 0933   HDL 56.00 11/07/2016 1044   CHOLHDL 3 11/07/2016 1044   VLDL 17.6 11/07/2016 1044   LDLCALC 112 (H) 11/07/2016 1044   LDLDIRECT 162.4 03/18/2012 0900  Wt Readings from Last 3 Encounters:  03/21/17 195 lb 6.4 oz (88.6 kg)  12/04/16 197 lb 4 oz (89.5 kg)  11/14/16 204 lb (92.5 kg)     Cardiac Studies Reviewed: Echo 03-12-2017: Left ventricle:  The cavity size was normal. Wall thickness was increased in a pattern of mild LVH. Systolic function was normal. The estimated ejection fraction was in the range of 60% to 65%. Wall motion was normal; there were no regional wall motion abnormalities. Features are consistent with a pseudonormal left ventricular filling pattern, with concomitant abnormal relaxation and increased filling pressure (grade 2 diastolic dysfunction).  ------------------------------------------------------------------- Aortic valve:   Structurally normal valve.   Cusp separation was normal.  Doppler:  Transvalvular velocity was within the normal range. There was no stenosis. There was no regurgitation.    VTI ratio of LVOT to aortic valve: 0.42. Valve area (VTI): 1.2 cm^2. Indexed valve area (VTI): 0.58 cm^2/m^2. Peak velocity ratio of LVOT to aortic valve: 0.38. Valve area (Vmax): 1.08 cm^2. Indexed valve area (Vmax): 0.52 cm^2/m^2. Mean velocity ratio of LVOT to aortic valve: 0.44. Valve area (Vmean): 1.25 cm^2. Indexed valve area (Vmean): 0.61 cm^2/m^2.    Mean gradient (S): 12 mm Hg. Peak gradient (S): 26 mm Hg.  ------------------------------------------------------------------- Aorta:  Aortic root: The aortic root was normal in size. Ascending  aorta: The ascending aorta was normal in size.  ------------------------------------------------------------------- Mitral valve:   Structurally normal valve.   Leaflet separation was normal.  Doppler:  Transvalvular velocity was within the normal range. There was no evidence for stenosis. There was trivial regurgitation.    Valve area by pressure half-time: 3.24 cm^2. Indexed valve area by pressure half-time: 1.57 cm^2/m^2.    Peak gradient (D): 6 mm Hg.  ------------------------------------------------------------------- Left atrium:  The atrium was normal in size.  ------------------------------------------------------------------- Right ventricle:  The cavity size was normal. Systolic function was normal.  ------------------------------------------------------------------- Pulmonic valve:    The valve appears to be grossly normal. Doppler:  There was trivial regurgitation.  ------------------------------------------------------------------- Tricuspid valve:   Structurally normal valve.   Leaflet separation was normal.  Doppler:  Transvalvular velocity was within the normal range. There was trivial regurgitation.  ------------------------------------------------------------------- Pulmonary artery:   Systolic pressure was at the upper limits of normal.  ------------------------------------------------------------------- Right atrium:  The atrium was normal in size.  MRI Brain 03-07-2017: FINDINGS: Brain: Diffuse prominence of the CSF containing spaces compatible with generalized age-related cerebral atrophy. Patchy and confluent T2/FLAIR hyperintensity within the periventricular and deep white matter both cerebral hemispheres most consistent with chronic small vascular disease, moderate to advanced in nature. Chronic microvascular changes present within the pons as well.  No abnormal foci of restricted diffusion to suggest acute or subacute ischemia. Gray-white  matter differentiation maintained. No encephalomalacia to suggest chronic infarction. No evidence for acute intracranial hemorrhage. Single punctate chronic microhemorrhage noted within the left temporal lobe, of doubtful significance in isolation.  No mass lesion, midline shift or mass effect. No hydrocephalus. No extra-axial fluid collection. Major dural sinuses are grossly patent.  Vascular: Major intracranial vascular flow voids are maintained.  Skull and upper cervical spine: Craniocervical junction within normal limits. Upper cervical spine normal. Bone marrow signal intensity normal. Hyperostosis frontalis interna noted. No scalp soft tissue abnormality.  Sinuses/Orbits: Globes and orbital soft tissues within normal limits. Patient status post lens extraction bilaterally. Scattered mucosal thickening and opacity noted within the frontal sinuses and ethmoidal air cells. Paranasal sinuses otherwise largely clear. No mastoid effusion. Inner ear structures normal.  Other: None.  IMPRESSION: 1. No acute intracranial abnormality identified. 2. Moderate to advanced cerebral white matter changes, most consistent with chronic microvascular ischemic disease. ASSESSMENT AND PLAN: 1.  Aortic valve disease status post bioprosthetic aortic valve replacement: I reviewed the patient's recent echocardiogram.  Her mean transvalvular gradient is 11 mmHg.  There is no significant aortic valve regurgitation.  She will continue her current medical program.  2.  Hypertension: Continue combination of amlodipine, carvedilol, hydrochlorothiazide, and Aldactone.  Blood pressure is well controlled.  3.  Hyperlipidemia: The patient is intolerant of Zetia and simvastatin as well as other statin drugs.  She will continue with nonpharmacologic measures.  4.  Dizziness: No clear cardiac etiology.  Her echocardiogram is unremarkable.  Her symptoms do not seem to be related to postural changes.  Will  check a carotid duplex scan to make sure she does not have significant carotid obstructive disease.  Current medicines are reviewed with the patient today.  The patient does not have concerns regarding medicines.  Labs/ tests ordered today include:  No orders of the defined types were placed in this encounter.   Disposition:   FU one year  Signed, Sherren Mocha, MD  03/21/2017 10:31 AM    [facility] [place], [place],   [address] Phone: [phone]; Fax: [phone]

## 2017-03-22 ENCOUNTER — Encounter: Payer: Self-pay | Admitting: Family Medicine

## 2017-03-22 ENCOUNTER — Ambulatory Visit (INDEPENDENT_AMBULATORY_CARE_PROVIDER_SITE_OTHER): Payer: Medicare Other | Admitting: Family Medicine

## 2017-03-22 VITALS — BP 130/76 | HR 76 | Temp 98.1°F | Ht 65.5 in | Wt 193.5 lb

## 2017-03-22 DIAGNOSIS — R42 Dizziness and giddiness: Secondary | ICD-10-CM

## 2017-03-22 NOTE — Assessment & Plan Note (Signed)
Seen in ED on 10/10  Reviewed hospital records, lab results and studies in detail  Much improved Re assuring MRI  Will ref to ENT for residual symptoms-may need further diag/tx Disc PT/exercises as well  Has meclizine for prn use/aware of potential sedation

## 2017-03-22 NOTE — Progress Notes (Signed)
Subjective:    Patient ID: [name], female    DOB: [birth year], 83 y.o.   MRN: [id number]  HPI  Here for f/u of ED visit 10/10 with vertigo  tx with iv fluids meclizine and zofran  EKG stable   Labs Results for orders placed or performed during the hospital encounter of 03/06/17  CBC  Result Value Ref Range   WBC 8.7 4.0 - 10.5 K/uL   RBC 4.80 3.87 - 5.11 MIL/uL   Hemoglobin 14.6 12.0 - 15.0 g/dL   HCT 41.6 36.0 - 46.0 %   MCV 86.7 78.0 - 100.0 fL   MCH 30.4 26.0 - 34.0 pg   MCHC 35.1 30.0 - 36.0 g/dL   RDW 13.2 11.5 - 15.5 %   Platelets 121 (L) 150 - 400 K/uL  Basic metabolic panel  Result Value Ref Range   Sodium 137 135 - 145 mmol/L   Potassium 3.4 (L) 3.5 - 5.1 mmol/L   Chloride 100 (L) 101 - 111 mmol/L   CO2 29 22 - 32 mmol/L   Glucose, Bld 146 (H) 65 - 99 mg/dL   BUN 14 6 - 20 mg/dL   Creatinine, Ser 1.08 (H) 0.44 - 1.00 mg/dL   Calcium 9.6 8.9 - 10.3 mg/dL   GFR calc non Af Amer 47 (L) >60 mL/min   GFR calc Af Amer 54 (L) >60 mL/min   Anion gap 8 5 - 15  Urinalysis, Routine w reflex microscopic  Result Value Ref Range   Color, Urine YELLOW YELLOW   APPearance CLEAR CLEAR   Specific Gravity, Urine 1.009 1.005 - 1.030   pH 7.0 5.0 - 8.0   Glucose, UA NEGATIVE NEGATIVE mg/dL   Hgb urine dipstick NEGATIVE NEGATIVE   Bilirubin Urine NEGATIVE NEGATIVE   Ketones, ur NEGATIVE NEGATIVE mg/dL   Protein, ur NEGATIVE NEGATIVE mg/dL   Nitrite NEGATIVE NEGATIVE   Leukocytes, UA MODERATE (A) NEGATIVE   RBC / HPF 0-5 0 - 5 RBC/hpf   WBC, UA 6-30 0 - 5 WBC/hpf   Bacteria, UA RARE (A) NONE SEEN   Squamous Epithelial / LPF 0-5 (A) NONE SEEN   Hyaline Casts, UA PRESENT      MRI Mr Brain Wo Contrast  Result Date: 03/07/2017 CLINICAL DATA:  Initial evaluation for acute onset dizziness, nausea, vomiting. EXAM: MRI HEAD WITHOUT CONTRAST TECHNIQUE: Multiplanar, multiecho pulse sequences of the brain and surrounding structures were obtained without intravenous contrast.  COMPARISON:  Prior MRI from 05/06/2009. FINDINGS: Brain: Diffuse prominence of the CSF containing spaces compatible with generalized age-related cerebral atrophy. Patchy and confluent T2/FLAIR hyperintensity within the periventricular and deep white matter both cerebral hemispheres most consistent with chronic small vascular disease, moderate to advanced in nature. Chronic microvascular changes present within the pons as well. No abnormal foci of restricted diffusion to suggest acute or subacute ischemia. Gray-white matter differentiation maintained. No encephalomalacia to suggest chronic infarction. No evidence for acute intracranial hemorrhage. Single punctate chronic microhemorrhage noted within the left temporal lobe, of doubtful significance in isolation. No mass lesion, midline shift or mass effect. No hydrocephalus. No extra-axial fluid collection. Major dural sinuses are grossly patent. Vascular: Major intracranial vascular flow voids are maintained. Skull and upper cervical spine: Craniocervical junction within normal limits. Upper cervical spine normal. Bone marrow signal intensity normal. Hyperostosis frontalis interna noted. No scalp soft tissue abnormality. Sinuses/Orbits: Globes and orbital soft tissues within normal limits. Patient status post lens extraction bilaterally. Scattered mucosal thickening and opacity noted within  the frontal sinuses and ethmoidal air cells. Paranasal sinuses otherwise largely clear. No mastoid effusion. Inner ear structures normal. Other: None. IMPRESSION: 1. No acute intracranial abnormality identified. 2. Moderate to advanced cerebral white matter changes, most consistent with chronic microvascular ischemic disease. Electronically Signed   By: Jeannine Boga M.D.   On: 03/07/2017 02:27   re assuring   Was d/c with px for meclizine and zofran    Wt Readings from Last 3 Encounters:  03/22/17 193 lb 8 oz (87.8 kg)  03/21/17 195 lb 6.4 oz (88.6 kg)  12/04/16  197 lb 4 oz (89.5 kg)   BP Readings from Last 3 Encounters:  03/22/17 130/76  03/21/17 118/74  03/07/17 (!) 150/60   Pulse Readings from Last 3 Encounters:  03/22/17 76  03/21/17 69  03/07/17 74   Today she is better than she was but it is still present  No longer nausea  Her balance is bad - and changing positions makes it worse (head movement especially)  Cannot look both ways and walk at the same time  Worried she will fall and uses a cane  Worse when she is outside  Took meclizine - does make her sleepy   Improves later in the day   Patient Active Problem List   Diagnosis Date Noted  . Vertigo 03/22/2017  . Diarrhea 11/14/2016  . Urinary frequency 10/15/2016  . Urge incontinence 10/15/2016  . Nonallopathic lesion of lumbar region 03/28/2015  . Nonallopathic lesion of thoracic region 03/28/2015  . Encounter for Medicare annual wellness exam 01/15/2014  . Colon cancer screening 01/15/2014  . Risk for falls 03/25/2012  . Breast cancer of lower-outer quadrant of right female breast ([place]) 09/20/2011  . Dyspepsia 07/03/2011  . Vitamin D deficiency 08/29/2009  . DEPENDENT EDEMA, LEGS 09/07/2008  . CONSTIPATION 07/30/2008  . HYPERGLYCEMIA 07/09/2008  . Malignant neoplasm of corpus uteri, except isthmus ([place]) 10/28/2007  . HEARING LOSS, LEFT EAR 10/15/2007  . HEMORRHOIDS, INTERNAL 10/15/2007  . EXTERNAL HEMORRHOIDS 10/15/2007  . ESOPHAGEAL SPASM 10/15/2007  . DIVERTICULOSIS, COLON 10/15/2007  . Hyperlipidemia 10/07/2007  . Essential hypertension 10/07/2007  . Disorder of bone and cartilage 06/28/2006  . CAROTID BRUITS, BILATERAL 11/25/2005  . AORTIC STENOSIS 10/26/2004  . HIATAL HERNIA 12/27/1995   Past Medical History:  Diagnosis Date  . Alcohol abuse, unspecified   . Aortic stenosis    s/p tissue AVR 05/2011  . Arthritis   . Breast CA ([place])    rt  . Diaphragmatic hernia without mention of obstruction or gangrene   . Diverticulosis of colon (without mention of  hemorrhage)   . Dyskinesia of esophagus   . Family history of colonic polyps   . Heart murmur   . HLD (hyperlipidemia)   . HTN (hypertension)   . Internal hemorrhoids without mention of complication   . Malignant neoplasm of corpus uteri, except isthmus ([place])   . Other abnormal glucose   . Pain in joint, shoulder region   . Postmenopausal bleeding   . Tobacco use disorder   . Unspecified hearing loss    Past Surgical History:  Procedure Laterality Date  . ABDOMINAL HYSTERECTOMY    . ABDOMINAL HYSTERECTOMY    . AORTIC VALVE REPLACEMENT  06/13/2011   Procedure: AORTIC VALVE REPLACEMENT (AVR);  Surgeon: Gaye Pollack, MD;  Location: [facility];  Service: Open Heart Surgery;  Laterality: N/A;  . 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  . BREAST LUMPECTOMY  11/2009   DCIS, neg  sentinel lymph node biopsy  . BREAST LUMPECTOMY    . CATARACT EXTRACTION     bil  . LAPAROSCOPY W/ TOTAL BILATERAL PELVIC AND PERI-AORTIC LYMPHADENECTOMY  11/04/2007  . PARAAORTIC LYMPHADENECTOMY    . ROTATOR CUFF REPAIR  05/2008   right  . SHOULDER SURGERY    . TOTAL ABDOMINAL HYSTERECTOMY W/ BILATERAL SALPINGOOPHORECTOMY  11/04/07   salpingo-oophrectomy   Social History  Substance Use Topics  . Smoking status: Former Smoker    Packs/day: 1.00    Quit date: 03/28/2000  . Smokeless tobacco: Never Used  . Alcohol use No   Family History  Problem Relation Age of Onset  . Colon polyps Sister        2010  . Heart disease Mother   . Stroke Father   . Heart failure Father   . Cancer Maternal Aunt        possible post menopausal breast cancer   Allergies  Allergen Reactions  . Calcium     REACTION: constipation  . Diovan [Valsartan]     REACTION: abdominal pain  . Ezetimibe     REACTION: fatigue  . Ramipril     REACTION: abdominal pain  . Simvastatin     REACTION: leg pain and body aches   Current Outpatient Prescriptions on File Prior to Visit  Medication Sig Dispense Refill  . amLODipine (NORVASC) 5 MG tablet  TAKE 1 TABLET (5 MG TOTAL) BY MOUTH DAILY. 90 tablet 3  . amoxicillin (AMOXIL) 500 MG capsule Take 4 capsules by mouth as directed. 1 hour prior to dental procedure  2  . aspirin 81 MG tablet Take 1 tablet (81 mg total) by mouth daily.    . carvedilol (COREG) 25 MG tablet Take 1 tablet (25 mg total) by mouth 2 (two) times daily with a meal. 180 tablet 0  . Cholecalciferol (VITAMIN D) 1000 UNITS capsule Take 1 capsule (1,000 Units total) by mouth 2 (two) times daily. 180 capsule 3  . diphenhydrAMINE (BENADRYL) 50 MG tablet Take 50 mg by mouth every 6 (six) hours as needed for itching or allergies.     . diphenoxylate-atropine (LOMOTIL) 2.5-0.025 MG tablet Take 1-2 tablets every 4-6 hours for diarrhea. 30 tablet 1  . hydrochlorothiazide (HYDRODIURIL) 25 MG tablet TAKE 1 TABLET BY MOUTH EVERY DAY 90 tablet 3  . hydrocortisone (ANUSOL-HC) 2.5 % rectal cream Place 1 application rectally 2 (two) times daily. 30 g 3  . KLOR-CON M20 20 MEQ tablet TAKE 1 TABLET (20 MEQ TOTAL) BY MOUTH DAILY. 90 tablet 3  . meclizine (ANTIVERT) 25 MG tablet Take 1 tablet (25 mg total) by mouth 3 (three) times daily as needed for dizziness. 30 tablet 0  . Melatonin 3 MG CAPS Take 1 capsule by mouth at bedtime.    . Omega-3 Fatty Acids (FISH OIL) 1000 MG CAPS Take 2,000 mg by mouth daily.     . ondansetron (ZOFRAN ODT) 4 MG disintegrating tablet Take 1 tablet (4 mg total) by mouth every 8 (eight) hours as needed for nausea or vomiting. 20 tablet 0  . Psyllium (METAMUCIL PO) Take 1 Dose by mouth as directed.     Marland Kitchen spironolactone (ALDACTONE) 25 MG tablet TAKE 1 TABLET (25 MG TOTAL) BY MOUTH DAILY. 90 tablet 3   No current facility-administered medications on file prior to visit.      Review of Systems  Constitutional: Negative for activity change, appetite change, fatigue, fever and unexpected weight change.  HENT: Negative for congestion, ear  discharge, ear pain, postnasal drip, rhinorrhea, sinus pain, sinus pressure,  sneezing and sore throat.   Eyes: Negative for pain, redness and visual disturbance.  Respiratory: Negative for cough, shortness of breath and wheezing.   Cardiovascular: Negative for chest pain and palpitations.  Gastrointestinal: Negative for abdominal pain, blood in stool, constipation and diarrhea.  Endocrine: Negative for polydipsia and polyuria.  Genitourinary: Negative for dysuria, frequency and urgency.  Musculoskeletal: Negative for arthralgias, back pain and myalgias.  Skin: Negative for pallor and rash.  Allergic/Immunologic: Negative for environmental allergies.  Neurological: Positive for dizziness. Negative for tremors, seizures, syncope, facial asymmetry, speech difficulty, weakness, numbness and headaches.  Hematological: Negative for adenopathy. Does not bruise/bleed easily.  Psychiatric/Behavioral: Negative for decreased concentration and dysphoric mood. The patient is not nervous/anxious.        Objective:   Physical Exam  Constitutional: She appears well-developed and well-nourished. No distress.  obese and well appearing   HENT:  Head: Normocephalic and atraumatic.  Right Ear: External ear normal.  Left Ear: External ear normal.  Mouth/Throat: Oropharynx is clear and moist.  Wears hearing aides Nl app TMs No sinus tenderness  Throat clear   Eyes: Pupils are equal, round, and reactive to light. Conjunctivae and EOM are normal. Right eye exhibits no discharge. Left eye exhibits no discharge. No scleral icterus.  2 beats of horizontal nystagmus bilat  Neck: Normal range of motion. Neck supple. No JVD present. Carotid bruit is not present. No thyromegaly present.  Cardiovascular: Normal rate, regular rhythm, normal heart sounds and intact distal pulses.  Exam reveals no gallop.   Pulmonary/Chest: Effort normal and breath sounds normal. No respiratory distress. She has no wheezes. She has no rales.  No crackles  Abdominal: Soft. Bowel sounds are normal. She exhibits  no distension, no abdominal bruit and no mass. There is no tenderness.  Musculoskeletal: She exhibits no edema.  Lymphadenopathy:    She has no cervical adenopathy.  Neurological: She is alert. She has normal reflexes. She displays no tremor. No cranial nerve deficit or sensory deficit. She exhibits normal muscle tone. Coordination and gait normal.  Gait is steady Using cane as needed   Skin: Skin is warm and dry. No rash noted.  Psychiatric: She has a normal mood and affect.          Assessment & Plan:   Problem List Items Addressed This Visit      Other   Vertigo    Seen in ED on 10/10  Reviewed hospital records, lab results and studies in detail  Much improved Re assuring MRI  Will ref to ENT for residual symptoms-may need further diag/tx Disc PT/exercises as well  Has meclizine for prn use/aware of potential sedation       Relevant Orders   Ambulatory referral to ENT

## 2017-03-22 NOTE — Patient Instructions (Signed)
We will refer you to ENT for vertigo   Change position slowly  Careful not to fall  Use your cane -be very cautious  Take the meclizine if you need it

## 2017-04-02 DIAGNOSIS — R42 Dizziness and giddiness: Secondary | ICD-10-CM | POA: Diagnosis not present

## 2017-04-02 DIAGNOSIS — H9113 Presbycusis, bilateral: Secondary | ICD-10-CM | POA: Diagnosis not present

## 2017-04-03 ENCOUNTER — Ambulatory Visit (HOSPITAL_COMMUNITY)
Admission: RE | Admit: 2017-04-03 | Discharge: 2017-04-03 | Disposition: A | Discharge status: Home / Self Care | Payer: Medicare Other | Source: Ambulatory Visit | Attending: Cardiology | Admitting: Cardiology

## 2017-04-03 DIAGNOSIS — I10 Essential (primary) hypertension: Secondary | ICD-10-CM | POA: Diagnosis not present

## 2017-04-03 DIAGNOSIS — Z87891 Personal history of nicotine dependence: Secondary | ICD-10-CM | POA: Insufficient documentation

## 2017-04-03 DIAGNOSIS — E785 Hyperlipidemia, unspecified: Secondary | ICD-10-CM | POA: Diagnosis not present

## 2017-04-03 DIAGNOSIS — R42 Dizziness and giddiness: Secondary | ICD-10-CM

## 2017-04-03 DIAGNOSIS — I6523 Occlusion and stenosis of bilateral carotid arteries: Secondary | ICD-10-CM | POA: Diagnosis not present

## 2017-05-02 ENCOUNTER — Other Ambulatory Visit: Payer: Self-pay | Admitting: Cardiovascular Disease

## 2017-05-05 ENCOUNTER — Other Ambulatory Visit: Payer: Self-pay | Admitting: Cardiovascular Disease

## 2017-07-05 ENCOUNTER — Other Ambulatory Visit: Payer: Self-pay | Admitting: Cardiovascular Disease

## 2017-08-01 ENCOUNTER — Ambulatory Visit: Payer: Self-pay | Admitting: *Deleted

## 2017-08-01 NOTE — Telephone Encounter (Signed)
Pt called with complaints of "feeling coming on in headand it takes like an hour to get over it"; the 1st episode occurred on 07/30/17 twice, she also had nausea; she also had an episode today about 1230; prior to this;  the pt says that she drank some ensure (this only lasted a few minutes); the pt also says that she was hospitalized in October and diagnosed with vertigo; nurse triage initiated and recommendations made per protocol to include seeing a physician within 24 hours; pt requests Dr Glori Bickers but no appointments available within the parameters per protocol; pt offered and accepted appointment with Dr Dionisio Paschal 08/02/17 at 1115; pt verbalizes understanding.  Reason for Disposition . Taking a medicine that could cause dizziness (e.g., blood pressure medications, diuretics)  Answer Assessment - Initial Assessment Questions 1. DESCRIPTION: "Describe your dizziness."     Light headed 2. LIGHTHEADED: "Do you feel lightheaded?" (e.g., somewhat faint, woozy, weak upon standing)     doesn't feel light headed now 3. VERTIGO: "Do you feel like either you or the room is spinning or tilting?" (i.e. vertigo)     no 4. SEVERITY: "How bad is it?"  "Do you feel like you are going to faint?" "Can you stand and walk?"   - MILD - walking normally   - MODERATE - interferes with normal activities (e.g., work, school)    - SEVERE - unable to stand, requires support to walk, feels like passing out now.      Mild to moderate 5. ONSET:  "When did the dizziness begin?"     07/30/17 6. AGGRAVATING FACTORS: "Does anything make it worse?" (e.g., standing, change in head position)     unsure 7. HEART RATE: "Can you tell me your heart rate?" "How many beats in 15 seconds?"  (Note: not all patients can do this)       20 beats in 15 seconds 8. CAUSE: "What do you think is causing the dizziness?"     Not sure;    9. RECURRENT SYMPTOM: "Have you had dizziness before?" If so, ask: "When was the last time?" "What happened that  time?"     was in hospital related to this before and diagnosed with vertigo 10. OTHER SYMPTOMS: "Do you have any other symptoms?" (e.g., fever, chest pain, vomiting, diarrhea, bleeding)       nausea 11. PREGNANCY: "Is there any chance you are pregnant?" "When was your last menstrual period?"       no  Protocols used: DIZZINESS [facility]

## 2017-08-02 ENCOUNTER — Ambulatory Visit (INDEPENDENT_AMBULATORY_CARE_PROVIDER_SITE_OTHER): Payer: Medicare Other | Admitting: Family Medicine

## 2017-08-02 ENCOUNTER — Encounter: Payer: Self-pay | Admitting: Family Medicine

## 2017-08-02 VITALS — BP 136/68 | HR 77 | Temp 97.9°F | Wt 203.0 lb

## 2017-08-02 DIAGNOSIS — R42 Dizziness and giddiness: Secondary | ICD-10-CM

## 2017-08-02 NOTE — Patient Instructions (Addendum)
Start checking blood pressures at home - looking for low readings correlated to symptoms.  Stop benadryl, or at least cut in half. Monitor effect off benadryl.  If ongoing despite stopping benadryl, let me know and I would refer you to neurology.

## 2017-08-02 NOTE — Assessment & Plan Note (Addendum)
Not consistent with true vertigo, ENT did not think this was inner ear related. Describes more episodes of lightheadedness not positional or food related. I have asked her to check blood pressures when she has symptoms to r/o hypotension as cause.  She has recently restarted taking benadryl 50mg  for sleep. I did recommend she stop this medication and reassess symptoms off sedating antihistamine. If recurrent and persistent despite stopping benadryl, she will let me know for neurology referral.  Pt/son agree with plan.

## 2017-08-02 NOTE — Progress Notes (Signed)
BP 136/68 (BP Location: Left Arm, Patient Position: Sitting, Cuff Size: Normal)   Pulse 77   Temp 97.9 F (36.6 C) (Oral)   Wt 203 lb (92.1 kg)   LMP 05/29/2007   SpO2 96%   BMI 33.27 kg/m    CC: dizziness Subjective:    Patient ID: [name], female    DOB: 1934-04-11, 84 y.o.   MRN: [id number]  HPI: [name] is a 84 y.o. female presenting on 08/02/2017 for Dizziness (Has felt lightheaded and some nausea that started 07/30/17. Had 2 episodes yesterday and 1 last night. Those yesterday lasted about 45 mins. The episode last night lasted about 2 hrs. Took 1 meclazine. Seen in ED 02/2017 for similar sxs, dx vertigo.  Pt accompanied by son, Legrand [place].)   3d h/o dizziness, upset stomach. Yesterday had 3 episodes. Today feels some better. Describes spells that last 1-2 hours. Dizziness described as lightheadedness. No vertigo, presyncope, LOC, chest pain, palpitations, dyspnea or headaches. Denies orthostatic symptoms upon standing.  Has been treating with meclizine with improvement. Closing eyes also helps.  At baseline has trouble with balance. Walks with cane. More trouble walking on uneven surfaces.  Latest episode happened while peeling orange yesterday.  She has noticed that symptoms come on with tilting head back and extension of neck.  Denies paresthesias of feet. Symptoms don't correlate to mealtime.   Longstanding h/o dizziness s/p ER eval 02/2017 - at that time MRI was stable, thought peripheral vertigo. F/u carotid US with only mild stenosis. Saw PCP then ENT [facility]) - notes reviewed. Not clear cut inner ear vertigo - suggested vestibular rehab but this was deferred by patient.   Relevant past medical, surgical, family and social history reviewed and updated as indicated. Interim medical history since our last visit reviewed. Allergies and medications reviewed and updated. Outpatient Medications Prior to Visit  Medication Sig Dispense Refill  . amLODipine (NORVASC) 5 MG  tablet TAKE 1 TABLET (5 MG TOTAL) BY MOUTH DAILY. 90 tablet 2  . amoxicillin (AMOXIL) 500 MG capsule Take 4 capsules by mouth as directed. 1 hour prior to dental procedure  2  . aspirin 81 MG tablet Take 1 tablet (81 mg total) by mouth daily.    . carvedilol (COREG) 25 MG tablet TAKE 1 TABLET (25 MG TOTAL) BY MOUTH 2 (TWO) TIMES DAILY WITH A MEAL. 180 tablet 1  . Cholecalciferol (VITAMIN D) 1000 UNITS capsule Take 1 capsule (1,000 Units total) by mouth 2 (two) times daily. 180 capsule 3  . diphenhydrAMINE (BENADRYL) 50 MG tablet Take 50 mg by mouth every 6 (six) hours as needed for itching or allergies.     . diphenoxylate-atropine (LOMOTIL) 2.5-0.025 MG tablet Take 1-2 tablets every 4-6 hours for diarrhea. 30 tablet 1  . hydrochlorothiazide (HYDRODIURIL) 25 MG tablet TAKE 1 TABLET BY MOUTH EVERY DAY 90 tablet 3  . hydrocortisone (ANUSOL-HC) 2.5 % rectal cream Place 1 application rectally 2 (two) times daily. 30 g 3  . KLOR-CON M20 20 MEQ tablet TAKE 1 TABLET (20 MEQ TOTAL) BY MOUTH DAILY. 90 tablet 3  . meclizine (ANTIVERT) 25 MG tablet Take 1 tablet (25 mg total) by mouth 3 (three) times daily as needed for dizziness. 30 tablet 0  . Melatonin 3 MG CAPS Take 1 capsule by mouth at bedtime.    . Omega-3 Fatty Acids (FISH OIL) 1000 MG CAPS Take 2,000 mg by mouth daily.     . ondansetron (ZOFRAN ODT) 4 MG disintegrating tablet  Take 1 tablet (4 mg total) by mouth every 8 (eight) hours as needed for nausea or vomiting. 20 tablet 0  . Psyllium (METAMUCIL PO) Take 1 Dose by mouth as directed.     Marland Kitchen spironolactone (ALDACTONE) 25 MG tablet TAKE 1 TABLET (25 MG TOTAL) BY MOUTH DAILY. 90 tablet 2   No facility-administered medications prior to visit.      Per HPI unless specifically indicated in ROS section below Review of Systems     Objective:    BP 136/68 (BP Location: Left Arm, Patient Position: Sitting, Cuff Size: Normal)   Pulse 77   Temp 97.9 F (36.6 C) (Oral)   Wt 203 lb (92.1 kg)   LMP  05/29/2007   SpO2 96%   BMI 33.27 kg/m   Wt Readings from Last 3 Encounters:  08/02/17 203 lb (92.1 kg)  03/22/17 193 lb 8 oz (87.8 kg)  03/21/17 195 lb 6.4 oz (88.6 kg)    Physical Exam  Constitutional: She appears well-developed and well-nourished. No distress.  HENT:  Head: Normocephalic and atraumatic.  Mouth/Throat: Oropharynx is clear and moist. No oropharyngeal exudate.  Eyes: Conjunctivae and EOM are normal. Pupils are equal, round, and reactive to light. No scleral icterus.  Neck: Normal range of motion. Neck supple. Carotid bruit is present (bilateral mild). No thyromegaly present.  Cardiovascular: Normal rate, regular rhythm and intact distal pulses.  Murmur (3/6 systolic) heard. Pulmonary/Chest: Effort normal and breath sounds normal. No respiratory distress. She has no wheezes. She has no rales.  Abdominal: Soft. There is no tenderness.  No abd/renal bruit  Musculoskeletal: She exhibits edema (nonpitting).  Lymphadenopathy:    She has no cervical adenopathy.  Neurological: She is alert. She has normal strength. No cranial nerve deficit or sensory deficit. She displays a negative Romberg sign.  CN 2-12 intact FTN intact No pronator drift Unsteady slowed gait, walks with walker  Skin: Skin is warm and dry. No rash noted.  Psychiatric: She has a normal mood and affect.    Lab Results  Component Value Date   TSH 1.17 11/07/2016    Lab Results  Component Value Date   HGBA1C 6.0 11/07/2016      Assessment & Plan:  Over 25 minutes were spent face-to-face with the patient during this encounter and >50% of that time was spent on counseling and coordination of care  Problem List Items Addressed This Visit    Episodic lightheadedness - Primary    Not consistent with true vertigo, ENT did not think this was inner ear related. Describes more episodes of lightheadedness not positional or food related. I have asked her to check blood pressures when she has symptoms to r/o  hypotension as cause.  She has recently restarted taking benadryl 50mg  for sleep. I did recommend she stop this medication and reassess symptoms off sedating antihistamine. If recurrent and persistent despite stopping benadryl, she will let me know for neurology referral.  Pt/son agree with plan.           No orders of the defined types were placed in this encounter.  No orders of the defined types were placed in this encounter.   Follow up plan: Return if symptoms worsen or fail to improve.  Ria Bush, MD

## 2017-08-04 ENCOUNTER — Other Ambulatory Visit: Payer: Self-pay | Admitting: Cardiovascular Disease

## 2017-09-04 ENCOUNTER — Encounter: Payer: Self-pay | Admitting: Family Medicine

## 2017-09-04 ENCOUNTER — Ambulatory Visit (INDEPENDENT_AMBULATORY_CARE_PROVIDER_SITE_OTHER): Payer: Medicare Other | Admitting: Family Medicine

## 2017-09-04 VITALS — Temp 98.2°F | Ht 65.0 in | Wt 207.2 lb

## 2017-09-04 DIAGNOSIS — I10 Essential (primary) hypertension: Secondary | ICD-10-CM | POA: Diagnosis not present

## 2017-09-04 DIAGNOSIS — R42 Dizziness and giddiness: Secondary | ICD-10-CM | POA: Diagnosis not present

## 2017-09-04 LAB — TSH: TSH: 2.25 u[IU]/mL (ref 0.35–4.50)

## 2017-09-04 LAB — BASIC METABOLIC PANEL
BUN: 15 mg/dL (ref 6–23)
CALCIUM: 10.2 mg/dL (ref 8.4–10.5)
CO2: 33 mEq/L — ABNORMAL HIGH (ref 19–32)
Chloride: 97 mEq/L (ref 96–112)
Creatinine, Ser: 0.77 mg/dL (ref 0.40–1.20)
GFR: 76.3 mL/min (ref >60.00)
GLUCOSE: 92 mg/dL (ref 70–99)
Potassium: 4.2 mEq/L (ref 3.5–5.1)
Sodium: 137 mEq/L (ref 135–145)

## 2017-09-04 LAB — CBC WITH DIFFERENTIAL/PLATELET
BASOS PCT: 0.6 % (ref 0.0–3.0)
Basophils Absolute: 0 10*3/uL (ref 0.0–0.1)
EOS PCT: 4.4 % (ref 0.0–5.0)
Eosinophils Absolute: 0.3 10*3/uL (ref 0.0–0.7)
HEMATOCRIT: 45.8 % (ref 36.0–46.0)
HEMOGLOBIN: 16 g/dL — AB (ref 12.0–15.0)
Lymphocytes Relative: 17 % (ref 12.0–46.0)
Lymphs Abs: 1.2 10*3/uL (ref 0.7–4.0)
MCHC: 34.9 g/dL (ref 30.0–36.0)
MCV: 87.3 fl (ref 78.0–100.0)
Monocytes Absolute: 0.7 10*3/uL (ref 0.1–1.0)
Monocytes Relative: 10 % (ref 3.0–12.0)
Neutro Abs: 4.8 10*3/uL (ref 1.4–7.7)
Neutrophils Relative %: 68 % (ref 43.0–77.0)
Platelets: 158 10*3/uL (ref 150.0–400.0)
RBC: 5.24 Mil/uL — ABNORMAL HIGH (ref 3.87–5.11)
RDW: 13.6 % (ref 11.5–15.5)
WBC: 7.1 10*3/uL (ref 4.0–10.5)

## 2017-09-04 NOTE — Progress Notes (Signed)
BP 122/78 (BP Location: Left Arm, Patient Position: Sitting, Cuff Size: Large)   Pulse 74   Temp 98.2 F (36.8 C) (Oral)   Ht 5\' 5"  (1.651 m)   Wt 207 lb 4 oz (94 kg)   LMP 05/29/2007   SpO2 95%   BMI 34.49 kg/m    CC: dizziness Subjective:    Patient ID: [name], female    DOB: 10-13-1933, 84 y.o.   MRN: [id number]  HPI: [name] is a 84 y.o. female presenting on 09/04/2017 for Dizziness (Still about the same. Says the spells subside but do not completely go away. Episodes are disrupting daily activities. )   See prior note for details. Seen here last month with recurrent episodes of intermittent lightheadedness associated with nausea, no vertigo, syncope or presyncope. No hearing changes. Meclizine helps. S/p unrevealing cardiac [facility]) and ENT [facility]) evaluation and unrevealing brain MRI late last year. Not clear cut inner ear vertigo - pt declined vestibular rehab at that time. Last visit I thought symptoms could be related to benadryl 50mg  use at night - she stopped this without significant improvement. Now using melatonin.  Plan was neurology referral if no better.  Walks with cane for chronic unsteadiness/imbalance.   She feels when episodes are coming on, improve with sitting down. Can last several minutes, happening almost daily. Now unsure if meclizine is actually helping. Usually feels best right after getting up in the morning. BP during these episodes has been ok. Finds that leaning head forward or extending neck backwards precipitates lightheadedness. Episodes can happen either sitting or standing - not really positionaly in nature.   MR brain 02/2017 - without acute findings, moderate to advanced cerebral white matter changes consistent with chronic microvascular ischemic disease. Episodes happen both sitting and standing.  Echocardiogram 02/2017 - normal function of aortic valve bioprosthesis, otherwise normal.  Carotid US 03/2017 - patent subclavian and  vertebral arteries with 1-39% BICA stenosis.   H/o aortic stenosis s/p bioprosthetic aortic valve replacement not on anticoagulation.  Relevant past medical, surgical, family and social history reviewed and updated as indicated. Interim medical history since our last visit reviewed. Allergies and medications reviewed and updated. Outpatient Medications Prior to Visit  Medication Sig Dispense Refill  . amLODipine (NORVASC) 5 MG tablet TAKE 1 TABLET (5 MG TOTAL) BY MOUTH DAILY. 90 tablet 2  . amoxicillin (AMOXIL) 500 MG capsule Take 4 capsules by mouth as directed. 1 hour prior to dental procedure  2  . aspirin 81 MG tablet Take 1 tablet (81 mg total) by mouth daily.    . carvedilol (COREG) 25 MG tablet TAKE 1 TABLET (25 MG TOTAL) BY MOUTH 2 (TWO) TIMES DAILY WITH A MEAL. 180 tablet 1  . Cholecalciferol (VITAMIN D) 1000 UNITS capsule Take 1 capsule (1,000 Units total) by mouth 2 (two) times daily. 180 capsule 3  . diphenoxylate-atropine (LOMOTIL) 2.5-0.025 MG tablet Take 1-2 tablets every 4-6 hours for diarrhea. 30 tablet 1  . hydrochlorothiazide (HYDRODIURIL) 25 MG tablet TAKE 1 TABLET BY MOUTH EVERY DAY 90 tablet 2  . hydrocortisone (ANUSOL-HC) 2.5 % rectal cream Place 1 application rectally 2 (two) times daily. 30 g 3  . KLOR-CON M20 20 MEQ tablet TAKE 1 TABLET (20 MEQ TOTAL) BY MOUTH DAILY. 90 tablet 3  . meclizine (ANTIVERT) 25 MG tablet Take 1 tablet (25 mg total) by mouth 3 (three) times daily as needed for dizziness. 30 tablet 0  . Melatonin 3 MG CAPS Take 1  capsule by mouth at bedtime.    . Omega-3 Fatty Acids (FISH OIL) 1000 MG CAPS Take 2,000 mg by mouth daily.     . ondansetron (ZOFRAN ODT) 4 MG disintegrating tablet Take 1 tablet (4 mg total) by mouth every 8 (eight) hours as needed for nausea or vomiting. 20 tablet 0  . Psyllium (METAMUCIL PO) Take 1 Dose by mouth as directed.     Marland Kitchen spironolactone (ALDACTONE) 25 MG tablet TAKE 1 TABLET (25 MG TOTAL) BY MOUTH DAILY. 90 tablet 2    No facility-administered medications prior to visit.      Per HPI unless specifically indicated in ROS section below Review of Systems     Objective:    BP 122/78 (BP Location: Left Arm, Patient Position: Sitting, Cuff Size: Large)   Pulse 74   Temp 98.2 F (36.8 C) (Oral)   Ht 5\' 5"  (1.651 m)   Wt 207 lb 4 oz (94 kg)   LMP 05/29/2007   SpO2 95%   BMI 34.49 kg/m   Wt Readings from Last 3 Encounters:  09/04/17 207 lb 4 oz (94 kg)  08/02/17 203 lb (92.1 kg)  03/22/17 193 lb 8 oz (87.8 kg)    Physical Exam  Constitutional: She appears well-developed and well-nourished. No distress.  HENT:  Mouth/Throat: Oropharynx is clear and moist. No oropharyngeal exudate.  Eyes: Pupils are equal, round, and reactive to light. Conjunctivae are normal. No scleral icterus.  Neck: Carotid bruit is present (mild). No thyromegaly present.  Cardiovascular: Normal rate, regular rhythm and intact distal pulses.  Murmur (systolic) heard. Pulmonary/Chest: Effort normal and breath sounds normal. No respiratory distress. She has no wheezes. She has no rales.  Musculoskeletal: She exhibits no edema.  Skin: Skin is warm and dry. No rash noted.  Psychiatric: She has a normal mood and affect.  Nursing note and vitals reviewed.  Results for orders placed or performed during the hospital encounter of 03/06/17  CBC  Result Value Ref Range   WBC 8.7 4.0 - 10.5 K/uL   RBC 4.80 3.87 - 5.11 MIL/uL   Hemoglobin 14.6 12.0 - 15.0 g/dL   HCT 41.6 36.0 - 46.0 %   MCV 86.7 78.0 - 100.0 fL   MCH 30.4 26.0 - 34.0 pg   MCHC 35.1 30.0 - 36.0 g/dL   RDW 13.2 11.5 - 15.5 %   Platelets 121 (L) 150 - 400 K/uL  Basic metabolic panel  Result Value Ref Range   Sodium 137 135 - 145 mmol/L   Potassium 3.4 (L) 3.5 - 5.1 mmol/L   Chloride 100 (L) 101 - 111 mmol/L   CO2 29 22 - 32 mmol/L   Glucose, Bld 146 (H) 65 - 99 mg/dL   BUN 14 6 - 20 mg/dL   Creatinine, Ser 1.08 (H) 0.44 - 1.00 mg/dL   Calcium 9.6 8.9 - 10.3  mg/dL   GFR calc non Af Amer 47 (L) >60 mL/min   GFR calc Af Amer 54 (L) >60 mL/min   Anion gap 8 5 - 15  Urinalysis, Routine w reflex microscopic  Result Value Ref Range   Color, Urine YELLOW YELLOW   APPearance CLEAR CLEAR   Specific Gravity, Urine 1.009 1.005 - 1.030   pH 7.0 5.0 - 8.0   Glucose, UA NEGATIVE NEGATIVE mg/dL   Hgb urine dipstick NEGATIVE NEGATIVE   Bilirubin Urine NEGATIVE NEGATIVE   Ketones, ur NEGATIVE NEGATIVE mg/dL   Protein, ur NEGATIVE NEGATIVE mg/dL   Nitrite NEGATIVE  NEGATIVE   Leukocytes, UA MODERATE (A) NEGATIVE   RBC / HPF 0-5 0 - 5 RBC/hpf   WBC, UA 6-30 0 - 5 WBC/hpf   Bacteria, UA RARE (A) NONE SEEN   Squamous Epithelial / LPF 0-5 (A) NONE SEEN   Hyaline Casts, UA PRESENT   No results found for: VITAMINB12     Assessment & Plan:   Problem List Items Addressed This Visit    Episodic lightheadedness - Primary    Ongoing intermittent lightheadedness with as of yet unclear etiology. Orthostatics normal today. Check labs today (CBC, lytes, TSH). Will refer to neurology for further evaluation. Pt agrees with plan.       Relevant Orders   TSH   CBC with Differential/Platelet   Basic metabolic panel   Ambulatory referral to Neurology   Essential hypertension   Relevant Orders   TSH       No orders of the defined types were placed in this encounter.  Orders Placed This Encounter  Procedures  . TSH  . CBC with Differential/Platelet  . Basic metabolic panel  . Ambulatory referral to Neurology    Referral Priority:   Routine    Referral Type:   Consultation    Referral Reason:   Specialty Services Required    Requested Specialty:   Neurology    Number of Visits Requested:   1    Follow up plan: No follow-ups on file.  Ria Bush, MD

## 2017-09-04 NOTE — Assessment & Plan Note (Signed)
Ongoing intermittent lightheadedness with as of yet unclear etiology. Orthostatics normal today. Check labs today (CBC, lytes, TSH). Will refer to neurology for further evaluation. Pt agrees with plan.

## 2017-09-04 NOTE — Patient Instructions (Signed)
Labs today. Still unclear cause of intermittent lightheadedness.  We will refer you to neurology for further evaluation.  Nice to see you today.

## 2017-11-07 ENCOUNTER — Encounter: Payer: Self-pay | Admitting: Neurology

## 2017-11-07 ENCOUNTER — Ambulatory Visit (INDEPENDENT_AMBULATORY_CARE_PROVIDER_SITE_OTHER): Payer: Medicare Other | Admitting: Neurology

## 2017-11-07 VITALS — BP 137/81 | HR 71 | Ht 65.5 in | Wt 207.0 lb

## 2017-11-07 DIAGNOSIS — R42 Dizziness and giddiness: Secondary | ICD-10-CM | POA: Diagnosis not present

## 2017-11-07 DIAGNOSIS — I498 Other specified cardiac arrhythmias: Secondary | ICD-10-CM | POA: Diagnosis not present

## 2017-11-07 NOTE — Patient Instructions (Addendum)
30-day cardiac monitor Vestibular therapy  Vertigo Vertigo is the feeling that you or your surroundings are moving when they are not. Vertigo can be dangerous if it occurs while you are doing something that could endanger you or others, such as driving. What are the causes? This condition is caused by a disturbance in the signals that are sent by your body's sensory systems to your brain. Different causes of a disturbance can lead to vertigo, including:  Infections, especially in the inner ear.  A bad reaction to a drug, or misuse of alcohol and medicines.  Withdrawal from drugs or alcohol.  Quickly changing positions, as when lying down or rolling over in bed.  Migraine headaches.  Decreased blood flow to the brain.  Decreased blood pressure.  Increased pressure in the brain from a head or neck injury, stroke, infection, tumor, or bleeding.  Central nervous system disorders.  What are the signs or symptoms? Symptoms of this condition usually occur when you move your head or your eyes in different directions. Symptoms may start suddenly, and they usually last for less than a minute. Symptoms may include:  Loss of balance and falling.  Feeling like you are spinning or moving.  Feeling like your surroundings are spinning or moving.  Nausea and vomiting.  Blurred vision or double vision.  Difficulty hearing.  Slurred speech.  Dizziness.  Involuntary eye movement (nystagmus).  Symptoms can be mild and cause only slight annoyance, or they can be severe and interfere with daily life. Episodes of vertigo may return (recur) over time, and they are often triggered by certain movements. Symptoms may improve over time. How is this diagnosed? This condition may be diagnosed based on medical history and the quality of your nystagmus. Your health care provider may test your eye movements by asking you to quickly change positions to trigger the nystagmus. This may be called the  Dix-Hallpike test, head thrust test, or roll test. You may be referred to a health care provider who specializes in ear, nose, and throat (ENT) problems (otolaryngologist) or a provider who specializes in disorders of the central nervous system (neurologist). You may have additional testing, including:  A physical exam.  Blood tests.  MRI.  A CT scan.  An electrocardiogram (ECG). This records electrical activity in your heart.  An electroencephalogram (EEG). This records electrical activity in your brain.  Hearing tests.  How is this treated? Treatment for this condition depends on the cause and the severity of the symptoms. Treatment options include:  Medicines to treat nausea or vertigo. These are usually used for severe cases. Some medicines that are used to treat other conditions may also reduce or eliminate vertigo symptoms. These include: ? Medicines that control allergies (antihistamines). ? Medicines that control seizures (anticonvulsants). ? Medicines that relieve depression (antidepressants). ? Medicines that relieve anxiety (sedatives).  Head movements to adjust your inner ear back to normal. If your vertigo is caused by an ear problem, your health care provider may recommend certain movements to correct the problem.  Surgery. This is rare.  Follow these instructions at home: Safety  Move slowly.Avoid sudden body or head movements.  Avoid driving.  Avoid operating heavy machinery.  Avoid doing any tasks that would cause danger to you or others if you would have a vertigo episode during the task.  If you have trouble walking or keeping your balance, try using a cane for stability. If you feel dizzy or unstable, sit down right away.  Return to your  normal activities as told by your health care provider. Ask your health care provider what activities are safe for you. General instructions  Take over-the-counter and prescription medicines only as told by your  health care provider.  Avoid certain positions or movements as told by your health care provider.  Drink enough fluid to keep your urine clear or pale yellow.  Keep all follow-up visits as told by your health care provider. This is important. Contact a health care provider if:  Your medicines do not relieve your vertigo or they make it worse.  You have a fever.  Your condition gets worse or you develop new symptoms.  Your family or friends notice any behavioral changes.  Your nausea or vomiting gets worse.  You have numbness or a "pins and needles" sensation in part of your body. Get help right away if:  You have difficulty moving or speaking.  You are always dizzy.  You faint.  You develop severe headaches.  You have weakness in your hands, arms, or legs.  You have changes in your hearing or vision.  You develop a stiff neck.  You develop sensitivity to light. This information is not intended to replace advice given to you by your health care provider. Make sure you discuss any questions you have with your health care provider. Document Released: 02/21/2005 Document Revised: 10/26/2015 Document Reviewed: 09/06/2014 Elsevier Interactive Patient Education  [employer].

## 2017-11-07 NOTE — Progress Notes (Signed)
[facility]    Provider:  Dr Jaynee Eagles Referring Provider: Abner Greenspan, MD Primary Care Physician:  Tower, Wynelle Fanny, MD  CC:  Spinning in her head  HPI:  [name] is a 84 y.o. female here as a referral from Dr. Glori Bickers for dizziness. Going on for 8 months. Gradually worsening becoming more frequent. When having the episodes she has to stay very still because movement of the head makes it worse. Feels like her brain is spinning. . She has seen ENT and cardiology. When she has the symptoms they can be severe, episodic lightheadedness. For a number of years she has a balance problem. Especially walking on uneven ground. Happens most days. She can't really explain it. No known triggers. Can happen anytime even laying down. She has a lot of nausea with the episode, she has a full feeling in the stomach, she vomited this past Sunday. Starts with something through the head or just not feeling right, has to sit down and will fall, sitting makes it better, can last a few minutes but takes hours sometimes to recover, she just feels weak. Getting worse. Becoming more frequent. They have been witnessed by family members. No headaches. No vision changes. No loss of vision. Not spinning. She feesl like she has spinning in her head. She has not checked her pulse or checked her blood pressure during her episodes. Former smoker.  No other focal neurologic deficits, associated symptoms, inciting events or modifiable factors.  Reviewed notes, labs and imaging from outside physicians, which showed:  TSH normal  Personally reviewed MRI brain images and agree with the following:  1. No acute intracranial abnormality identified. 2. Moderate to advanced cerebral white matter changes, most consistent with chronic microvascular ischemic disease.  Review of Systems: Patient complains of symptoms per HPI as well as the following symptoms: weakness, dizziness, joint pain. Pertinent negatives and  positives per HPI. All others negative.   Social History   Socioeconomic History  . Marital status: Widowed    Spouse name: Not on file  . Number of children: 2  . Years of education: Not on file  . Highest education level: High school graduate  Occupational History  . Not on file  Social Needs  . Financial resource strain: Not on file  . Food insecurity:    Worry: Not on file    Inability: Not on file  . Transportation needs:    Medical: Not on file    Non-medical: Not on file  Tobacco Use  . Smoking status: Former Smoker    Packs/day: 1.00    Last attempt to quit: 03/28/2000    Years since quitting: 17.6  . Smokeless tobacco: Never Used  Substance and Sexual Activity  . Alcohol use: Yes    Alcohol/week: 0.0 oz    Comment: occasional wine   . Drug use: Never  . Sexual activity: Never    Birth control/protection: None  Lifestyle  . Physical activity:    Days per week: Not on file    Minutes per session: Not on file  . Stress: Not on file  Relationships  . Social connections:    Talks on phone: Not on file    Gets together: Not on file    Attends religious service: Not on file    Active member of club or organization: Not on file    Attends meetings of clubs or organizations: Not on file    Relationship status: Not on file  .  Intimate partner violence:    Fear of current or ex partner: Not on file    Emotionally abused: Not on file    Physically abused: Not on file    Forced sexual activity: Not on file  Other Topics Concern  . Not on file  Social History Narrative   Lives at home alone   Right handed   Caffeine: 1-2 cups daily    Family History  Problem Relation Age of Onset  . Colon polyps Sister        2010  . Heart disease Mother   . Heart attack Mother   . Stroke Father   . Heart failure Father   . Diabetes type II Father   . Diabetes type II Brother   . Cancer Maternal Aunt        possible post menopausal breast cancer    Past Medical  History:  Diagnosis Date  . Alcohol abuse, unspecified   . Aortic stenosis    s/p tissue AVR 05/2011  . Arthritis   . Breast CA ([place])    rt  . Diaphragmatic hernia without mention of obstruction or gangrene   . Diverticulosis of colon (without mention of hemorrhage)   . Dyskinesia of esophagus   . Endometrial cancer ([place])   . Family history of colonic polyps   . Heart murmur   . HLD (hyperlipidemia)   . HTN (hypertension)   . Internal hemorrhoids without mention of complication   . Malignant neoplasm of corpus uteri, except isthmus ([facility])   . Other abnormal glucose   . Pain in joint, shoulder region   . Postmenopausal bleeding   . Tobacco use disorder   . Unspecified hearing loss   . Vertigo     Past Surgical History:  Procedure Laterality Date  . AORTIC VALVE REPLACEMENT  06/13/2011   Procedure: AORTIC VALVE REPLACEMENT (AVR);  Surgeon: Gaye Pollack, MD;  Location: [facility];  Service: Open Heart Surgery;  Laterality: N/A;  . 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  . BREAST LUMPECTOMY  11/2009   DCIS, neg sentinel lymph node biopsy  . CATARACT EXTRACTION     bil  . LAPAROSCOPY W/ TOTAL BILATERAL PELVIC AND PERI-AORTIC LYMPHADENECTOMY  11/04/2007  . PARAAORTIC LYMPHADENECTOMY    . ROTATOR CUFF REPAIR  05/2008   right  . TOTAL ABDOMINAL HYSTERECTOMY W/ BILATERAL SALPINGOOPHORECTOMY  11/04/07   salpingo-oophrectomy    Current Outpatient Medications  Medication Sig Dispense Refill  . amLODipine (NORVASC) 5 MG tablet TAKE 1 TABLET (5 MG TOTAL) BY MOUTH DAILY. 90 tablet 2  . aspirin 81 MG tablet Take 1 tablet (81 mg total) by mouth daily.    . carvedilol (COREG) 25 MG tablet TAKE 1 TABLET (25 MG TOTAL) BY MOUTH 2 (TWO) TIMES DAILY WITH A MEAL. 180 tablet 1  . Cholecalciferol (VITAMIN D) 1000 UNITS capsule Take 1 capsule (1,000 Units total) by mouth 2 (two) times daily. 180 capsule 3  . hydrochlorothiazide (HYDRODIURIL) 25 MG tablet TAKE 1 TABLET BY MOUTH EVERY DAY 90 tablet 2  . KLOR-CON M20  20 MEQ tablet TAKE 1 TABLET (20 MEQ TOTAL) BY MOUTH DAILY. 90 tablet 3  . Omega-3 Fatty Acids (FISH OIL) 1000 MG CAPS Take 2,000 mg by mouth daily.     . Psyllium (METAMUCIL PO) Take 1 Dose by mouth as directed.     Marland Kitchen spironolactone (ALDACTONE) 25 MG tablet TAKE 1 TABLET (25 MG TOTAL) BY MOUTH DAILY. 90 tablet 2  .  amoxicillin (AMOXIL) 500 MG capsule Take 4 capsules by mouth as directed. 1 hour prior to dental procedure  2  . diphenoxylate-atropine (LOMOTIL) 2.5-0.025 MG tablet Take 1-2 tablets every 4-6 hours for diarrhea. (Patient not taking: Reported on 11/07/2017) 30 tablet 1  . hydrocortisone (ANUSOL-HC) 2.5 % rectal cream Place 1 application rectally 2 (two) times daily. 30 g 3  . meclizine (ANTIVERT) 25 MG tablet Take 1 tablet (25 mg total) by mouth 3 (three) times daily as needed for dizziness. (Patient not taking: Reported on 11/07/2017) 30 tablet 0   No current facility-administered medications for this visit.     Allergies as of 11/07/2017 - Review Complete 11/07/2017  Allergen Reaction Noted  . Calcium  09/21/2009  . Diovan [valsartan]    . Ezetimibe    . Ramipril    . Simvastatin  12/07/2009    Vitals: BP 137/81 (BP Location: Left Arm, Patient Position: Sitting)   Pulse 71   Ht 5' 5.5" (1.664 m)   Wt 207 lb (93.9 kg)   LMP 05/29/2007   BMI 33.92 kg/m  Last Weight:  Wt Readings from Last 1 Encounters:  11/07/17 207 lb (93.9 kg)   Last Height:   Ht Readings from Last 1 Encounters:  11/07/17 5' 5.5" (1.664 m)    Physical exam: Exam: Gen: NAD, conversant, well nourised, obese, well groomed                     CV: RRR, +SEM . No Carotid Bruits. +peripheral edema, warm, nontender Eyes: Conjunctivae clear without exudates or hemorrhage  Neuro: Detailed Neurologic Exam  Speech:    Speech is normal; fluent and spontaneous with normal comprehension.  Cognition:    The patient is oriented to person, place, and time;     recent and remote memory intact;     language  fluent;     normal attention, concentration,     fund of knowledge Cranial Nerves:    The pupils are equal, round, and reactive to light. The fundi are normal and spontaneous venous pulsations are present. Visual fields are full to finger confrontation. Extraocular movements are intact. Trigeminal sensation is intact and the muscles of mastication are normal. Right ptosis otherwise the face is symmetric. The palate elevates in the midline. Hearing intact. Voice is normal. Shoulder shrug is normal. The tongue has normal motion without fasciculations.   Coordination:    Normal finger to nose and heel to shin. Normal rapid alternating movements.   Gait:    Wide based, Low clearance, small steps  Motor Observation:    Has to use hands to get out of chair. No asymmetry, no atrophy, and no involuntary movements noted. Tone:    Normal muscle tone.    Posture:    Slightly stooped    Strength:    Strength is V/V in the upper and lower limbs.      Sensation: intact to LT     Reflex Exam:  DTR's:    Absent lowers, trace uppers Toes:    The toes are downgoing bilaterally.   Clonus:    Clonus is absent.   Assessment/Plan:  84 year old with vertigo. She has been evaluated by ENT and has also seen Cardiology. MRI of the brain without etiology. No neurologic cause seen for her symptoms. Exam unremarkable for etiology.   Recommend Vestibular therapy 30-day heart monitor to evaluate for abnormal heart rhythms during episodes. Recommend she follow up with cardiology. Fall risks, discussed  fall prevention  Orders Placed This Encounter  Procedures  . Ambulatory referral to Physical Therapy  . CARDIAC EVENT MONITOR   Cc: Dr. Modena Morrow, MD  [facility] [address] [place], [place] [address]  Phone [phone] Fax [phone]

## 2017-11-09 ENCOUNTER — Other Ambulatory Visit: Payer: Self-pay | Admitting: Cardiovascular Disease

## 2017-11-09 ENCOUNTER — Encounter: Payer: Self-pay | Admitting: Neurology

## 2017-11-09 DIAGNOSIS — E559 Vitamin D deficiency, unspecified: Secondary | ICD-10-CM

## 2017-11-09 DIAGNOSIS — R7309 Other abnormal glucose: Secondary | ICD-10-CM

## 2017-11-09 DIAGNOSIS — E7800 Pure hypercholesterolemia, unspecified: Secondary | ICD-10-CM

## 2017-11-09 DIAGNOSIS — R42 Dizziness and giddiness: Secondary | ICD-10-CM | POA: Insufficient documentation

## 2017-11-09 DIAGNOSIS — I10 Essential (primary) hypertension: Secondary | ICD-10-CM

## 2017-11-10 NOTE — Telephone Encounter (Signed)
-----   Message from Eustace Pen, LPN sent at 6/00/4599  6:48 PM EDT ----- Regarding: Labs 6/17 Lab orders needed. Thank you.  Insurance:  [facility] [employer]

## 2017-11-11 ENCOUNTER — Ambulatory Visit (INDEPENDENT_AMBULATORY_CARE_PROVIDER_SITE_OTHER): Payer: Medicare Other

## 2017-11-11 VITALS — BP 124/80 | HR 65 | Temp 98.4°F | Ht 65.5 in | Wt 206.5 lb

## 2017-11-11 DIAGNOSIS — I10 Essential (primary) hypertension: Secondary | ICD-10-CM | POA: Diagnosis not present

## 2017-11-11 DIAGNOSIS — E7800 Pure hypercholesterolemia, unspecified: Secondary | ICD-10-CM | POA: Diagnosis not present

## 2017-11-11 DIAGNOSIS — Z0000 Encounter for general adult medical examination without abnormal findings: Secondary | ICD-10-CM | POA: Diagnosis not present

## 2017-11-11 DIAGNOSIS — R7309 Other abnormal glucose: Secondary | ICD-10-CM

## 2017-11-11 DIAGNOSIS — E559 Vitamin D deficiency, unspecified: Secondary | ICD-10-CM | POA: Diagnosis not present

## 2017-11-11 NOTE — Progress Notes (Signed)
Subjective:   [name] is a 84 y.o. female who presents for Medicare Annual (Subsequent) preventive examination.  Review of Systems:  N/A Cardiac Risk Factors include: advanced age (>64men, >58 women);obesity (BMI >30kg/m2);hypertension     Objective:     Vitals: BP 124/80 (BP Location: Right Arm, Patient Position: Sitting, Cuff Size: Normal)   Pulse 65   Temp 98.4 F (36.9 C) (Oral)   Ht 5' 5.5" (1.664 m) Comment: no shoes  Wt 206 lb 8 oz (93.7 kg)   LMP 05/29/2007   SpO2 94%   BMI 33.84 kg/m   Body mass index is 33.84 kg/m.  Advanced Directives 11/11/2017 03/06/2017 11/07/2016 09/15/2015 06/21/2014 06/15/2011 06/11/2011  Does Patient Have a Medical Advance Directive? Yes No Yes Yes No Patient does not have advance directive;Patient would not like information Patient does not have advance directive  Type of Advance Directive Healthcare Power of [place]  Does patient want to make changes to medical advance directive? - - - No - Patient declined - - -  Copy of [place] in Chart? No - copy requested - No - copy requested No - copy requested - - -  Would patient like information on creating a medical advance directive? - - - - No - patient declined information - -  Pre-existing out of facility DNR order (yellow form or pink MOST form) - - - - - - -    Tobacco Social History   Tobacco Use  Smoking Status Former Smoker  . Packs/day: 1.00  . Last attempt to quit: 03/28/2000  . Years since quitting: 17.6  Smokeless Tobacco Never Used     Counseling given: No   Clinical Intake:  Pre-visit preparation completed: Yes  Pain : No/denies pain Pain Score: 0-No pain     Nutritional Status: BMI > 30  Obese Nutritional Risks: None Diabetes: No  How often do you need to have someone help you when you read instructions, pamphlets, or other written materials from your doctor or pharmacy?: 1 -  Never What is the last grade level you completed in school?: 12th grade  Interpreter Needed?: No  Comments: pt is a widow and lives alone Information entered by :: LPinson, LPN  Past Medical History:  Diagnosis Date  . Alcohol abuse, unspecified   . Aortic stenosis    s/p tissue AVR 05/2011  . Arthritis   . Breast CA ([place])    rt  . Diaphragmatic hernia without mention of obstruction or gangrene   . Diverticulosis of colon (without mention of hemorrhage)   . Dyskinesia of esophagus   . Endometrial cancer ([place])   . Family history of colonic polyps   . Heart murmur   . HLD (hyperlipidemia)   . HTN (hypertension)   . Internal hemorrhoids without mention of complication   . Malignant neoplasm of corpus uteri, except isthmus ([place])   . Other abnormal glucose   . Pain in joint, shoulder region   . Postmenopausal bleeding   . Tobacco use disorder   . Unspecified hearing loss   . Vertigo    Past Surgical History:  Procedure Laterality Date  . AORTIC VALVE REPLACEMENT  06/13/2011   Procedure: AORTIC VALVE REPLACEMENT (AVR);  Surgeon: Gaye Pollack, MD;  Location: [facility];  Service: Open Heart Surgery;  Laterality: N/A;  . 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  . BREAST LUMPECTOMY  11/2009  DCIS, neg sentinel lymph node biopsy  . CATARACT EXTRACTION     bil  . LAPAROSCOPY W/ TOTAL BILATERAL PELVIC AND PERI-AORTIC LYMPHADENECTOMY  11/04/2007  . PARAAORTIC LYMPHADENECTOMY    . ROTATOR CUFF REPAIR  05/2008   right  . TOTAL ABDOMINAL HYSTERECTOMY W/ BILATERAL SALPINGOOPHORECTOMY  11/04/07   salpingo-oophrectomy   Family History  Problem Relation Age of Onset  . Colon polyps Sister        2010  . Heart disease Mother   . Heart attack Mother   . Stroke Father   . Heart failure Father   . Diabetes type II Father   . Diabetes type II Brother   . Cancer Maternal Aunt        possible post menopausal breast cancer   Social History   Socioeconomic History  . Marital status: Widowed    Spouse  name: Not on file  . Number of children: 2  . Years of education: Not on file  . Highest education level: High school graduate  Occupational History  . Not on file  Social Needs  . Financial resource strain: Not on file  . Food insecurity:    Worry: Not on file    Inability: Not on file  . Transportation needs:    Medical: Not on file    Non-medical: Not on file  Tobacco Use  . Smoking status: Former Smoker    Packs/day: 1.00    Last attempt to quit: 03/28/2000    Years since quitting: 17.6  . Smokeless tobacco: Never Used  Substance and Sexual Activity  . Alcohol use: Yes    Alcohol/week: 0.0 oz    Comment: occasional wine   . Drug use: Never  . Sexual activity: Never    Birth control/protection: None  Lifestyle  . Physical activity:    Days per week: Not on file    Minutes per session: Not on file  . Stress: Not on file  Relationships  . Social connections:    Talks on phone: Not on file    Gets together: Not on file    Attends religious service: Not on file    Active member of club or organization: Not on file    Attends meetings of clubs or organizations: Not on file    Relationship status: Not on file  Other Topics Concern  . Not on file  Social History Narrative   Lives at home alone   Right handed   Caffeine: 1-2 cups daily    Outpatient Encounter Medications as of 11/11/2017  Medication Sig  . amLODipine (NORVASC) 5 MG tablet TAKE 1 TABLET (5 MG TOTAL) BY MOUTH DAILY.  Marland Kitchen amoxicillin (AMOXIL) 500 MG capsule Take 4 capsules by mouth as directed. 1 hour prior to dental procedure  . aspirin 81 MG tablet Take 1 tablet (81 mg total) by mouth daily.  . carvedilol (COREG) 25 MG tablet TAKE 1 TABLET (25 MG TOTAL) BY MOUTH 2 (TWO) TIMES DAILY WITH A MEAL.  Marland Kitchen Cholecalciferol (VITAMIN D) 1000 UNITS capsule Take 1 capsule (1,000 Units total) by mouth 2 (two) times daily.  . hydrochlorothiazide (HYDRODIURIL) 25 MG tablet TAKE 1 TABLET BY MOUTH EVERY DAY  .  hydrocortisone (ANUSOL-HC) 2.5 % rectal cream Place 1 application rectally 2 (two) times daily.  Marland Kitchen KLOR-CON M20 20 MEQ tablet TAKE 1 TABLET (20 MEQ TOTAL) BY MOUTH DAILY.  Marland Kitchen Omega-3 Fatty Acids (FISH OIL) 1000 MG CAPS Take 2,000 mg by mouth daily.   Marland Kitchen  Psyllium (METAMUCIL PO) Take 1 Dose by mouth as directed.   Marland Kitchen spironolactone (ALDACTONE) 25 MG tablet TAKE 1 TABLET (25 MG TOTAL) BY MOUTH DAILY.  . diphenoxylate-atropine (LOMOTIL) 2.5-0.025 MG tablet Take 1-2 tablets every 4-6 hours for diarrhea. (Patient not taking: Reported on 11/11/2017)  . meclizine (ANTIVERT) 25 MG tablet Take 1 tablet (25 mg total) by mouth 3 (three) times daily as needed for dizziness. (Patient not taking: Reported on 11/11/2017)   No facility-administered encounter medications on file as of 11/11/2017.     Activities of Daily Living In your present state of health, do you have any difficulty performing the following activities: 11/11/2017  Hearing? Y  Vision? N  Difficulty concentrating or making decisions? N  Walking or climbing stairs? Y  Dressing or bathing? N  Doing errands, shopping? Y  Preparing Food and eating ? N  Using the Toilet? N  In the past six months, have you accidently leaked urine? Y  Do you have problems with loss of bowel control? N  Managing your Medications? N  Managing your Finances? N  Housekeeping or managing your Housekeeping? N  Some recent data might be hidden    Patient Care Team: Tower, Wynelle Fanny, MD as PCP - Cyndia Diver, MD (Cardiology) Gaye Pollack, MD (Cardiothoracic Surgery) Donnamae Jude, MD as Consulting Physician (Obstetrics and Gynecology) Jodi Marble, MD as Consulting Physician (Otolaryngology) Lia Hopping, DDS as Consulting Physician (Dentistry) Luberta Mutter, MD as Consulting Physician (Ophthalmology)    Assessment:   This is a routine wellness examination for Darleen.   Visual Acuity Screening   Right eye Left eye Both eyes  Without  correction:     With correction: 20/70 20/30-1 20/30-1  Hearing Screening Comments: Bilateral hearing aids  Exercise Activities and Dietary recommendations Current Exercise Habits: The patient does not participate in regular exercise at present, Exercise limited by: None identified  Goals    . Patient Stated     Starting 11/11/2017, I will continue to take medications as prescribed.         Fall Risk Fall Risk  11/11/2017 11/07/2016 09/15/2015 06/21/2014 01/15/2014  Falls in the past year? Yes No No No No  Comment Oct 15, 2017 fell due to dizziness - - - -  Number falls in past yr: 1 - - - -  Injury with Fall? No - - - -  Risk for fall due to : Impaired balance/gait;Impaired mobility - - - -   Depression Screen PHQ 2/9 Scores 11/11/2017 11/07/2016 09/15/2015 01/15/2014  PHQ - 2 Score 0 0 0 0  PHQ- 9 Score 0 - - -     Cognitive Function MMSE - Mini Mental State Exam 11/11/2017 11/07/2016 09/15/2015  Orientation to time 5 5 5   Orientation to Place 5 5 5   Registration 3 3 3   Attention/ Calculation 0 0 0  Recall 3 3 3   Language- name 2 objects 0 0 0  Language- repeat 1 1 1   Language- follow 3 step command 3 3 3   Language- read & follow direction 0 0 0  Write a sentence 0 0 0  Copy design 0 0 0  Total score 20 20 20      PLEASE NOTE: A Mini-Cog screen was completed. Maximum score is 20. A value of 0 denotes this part of [place] MMSE was not completed or the patient failed this part of the Mini-Cog screening.   Mini-Cog Screening Orientation to Time - Max 5 pts Orientation to  Place - Max 5 pts Registration - Max 3 pts Recall - Max 3 pts Language Repeat - Max 1 pts Language Follow 3 Step Command - Max 3 pts     Immunization History  Administered Date(s) Administered  . Influenza Split 03/27/2011, 03/25/2012  . Influenza Whole 03/07/2007, 02/17/2008, 03/08/2009, 03/20/2010  . Influenza,inj,Quad PF,6+ Mos 03/06/2013, 02/23/2014, 02/24/2016  . Influenza-Unspecified 02/27/2015,  01/19/2017  . Pneumococcal Conjugate-13 01/15/2014  . Pneumococcal Polysaccharide-23 03/08/2009  . Td 05/29/1995, 03/08/2009  . Zoster 08/12/2007    Screening Tests Health Maintenance  Topic Date Due  . MAMMOGRAM  02/24/2018 (Originally 02/15/2017)  . PAP SMEAR  11/07/2048 (Originally 03/28/2014)  . INFLUENZA VACCINE  12/26/2017  . TETANUS/TDAP  03/09/2019  . DEXA SCAN  Completed  . PNA vac Low Risk Adult  Completed       Plan:     I have personally reviewed, addressed, and noted the following in the patient's chart:  A. Medical and social history B. Use of alcohol, tobacco or illicit drugs  C. Current medications and supplements D. Functional ability and status E.  Nutritional status F.  Physical activity G. Advance directives H. List of other physicians I.  Hospitalizations, surgeries, and ER visits in previous 12 months J.  [place] to include hearing, vision, cognitive, depression L. Referrals and appointments - none  In addition, I have reviewed and discussed with patient certain preventive protocols, quality metrics, and best practice recommendations. A written personalized care plan for preventive services as well as general preventive health recommendations were provided to patient.  See attached scanned questionnaire for additional information.   Signed,   Lindell Noe, MHA, BS, LPN Health Coach

## 2017-11-11 NOTE — Progress Notes (Signed)
PCP notes:   Health maintenance:  Mammogram - addressed  Abnormal screenings:   Fall risk - hx of single fall Fall Risk  11/11/2017 11/07/2016 09/15/2015 06/21/2014 01/15/2014  Falls in the past year? Yes No No No No  Comment Oct 15, 2017 fell due to dizziness - - - -  Number falls in past yr: 1 - - - -  Injury with Fall? No - - - -  Risk for fall due to : Impaired balance/gait;Impaired mobility - - - -    Patient concerns:   None  Nurse concerns:  None  Next PCP appt:   11/25/17 @ 1045  I reviewed health advisor's note, was available for consultation, and agree with documentation and plan. Loura Pardon MD

## 2017-11-11 NOTE — Patient Instructions (Signed)
[name] , Thank you for taking time to come for your Medicare Wellness Visit. I appreciate your ongoing commitment to your health goals. Please review the following plan we discussed and let me know if I can assist you in the future.   These are the goals we discussed: Goals    . Patient Stated     Starting 11/11/2017, I will continue to take medications as prescribed.         This is a list of the screening recommended for you and due dates:  Health Maintenance  Topic Date Due  . Mammogram  02/24/2018*  . Pap Smear  11/07/2048*  . Flu Shot  12/26/2017  . Tetanus Vaccine  03/09/2019  . DEXA scan (bone density measurement)  Completed  . Pneumonia vaccines  Completed  *Topic was postponed. The date shown is not the original due date.   Preventive Care for Adults  A healthy lifestyle and preventive care can promote health and wellness. Preventive health guidelines for adults include the following key practices.  . A routine yearly physical is a good way to check with your health care provider about your health and preventive screening. It is a chance to share any concerns and updates on your health and to receive a thorough exam.  . Visit your dentist for a routine exam and preventive care every 6 months. Brush your teeth twice a day and floss once a day. Good oral hygiene prevents tooth decay and gum disease.  . The frequency of eye exams is based on your age, health, family medical history, use  of contact lenses, and other factors. Follow your health care provider's recommendations for frequency of eye exams.  . Eat a healthy diet. Foods like vegetables, fruits, whole grains, low-fat dairy products, and lean protein foods contain the nutrients you need without too many calories. Decrease your intake of foods high in solid fats, added sugars, and salt. Eat the right amount of calories for you. Get information about a proper diet from your health care provider, if necessary.  .  Regular physical exercise is one of the most important things you can do for your health. Most adults should get at least 150 minutes of moderate-intensity exercise (any activity that increases your heart rate and causes you to sweat) each week. In addition, most adults need muscle-strengthening exercises on 2 or more days a week.  Silver Sneakers may be a benefit available to you. To determine eligibility, you may visit the website: www.silversneakers.com or contact program at 251-786-0222 Mon-Fri between 8AM-8PM.   . Maintain a healthy weight. The body mass index (BMI) is a screening tool to identify possible weight problems. It provides an estimate of body fat based on height and weight. Your health care provider can find your BMI and can help you achieve or maintain a healthy weight.   For adults 20 years and older: ? A BMI below 18.5 is considered underweight. ? A BMI of 18.5 to 24.9 is normal. ? A BMI of 25 to 29.9 is considered overweight. ? A BMI of 30 and above is considered obese.   . Maintain normal blood lipids and cholesterol levels by exercising and minimizing your intake of saturated fat. Eat a balanced diet with plenty of fruit and vegetables. Blood tests for lipids and cholesterol should begin at age 109 and be repeated every 5 years. If your lipid or cholesterol levels are high, you are over 50, or you are at high risk for heart  disease, you may need your cholesterol levels checked more frequently. Ongoing high lipid and cholesterol levels should be treated with medicines if diet and exercise are not working.  . If you smoke, find out from your health care provider how to quit. If you do not use tobacco, please do not start.  . If you choose to drink alcohol, please do not consume more than 2 drinks per day. One drink is considered to be 12 ounces (355 mL) of beer, 5 ounces (148 mL) of wine, or 1.5 ounces (44 mL) of liquor.  . If you are 17-54 years old, ask your health care  provider if you should take aspirin to prevent strokes.  . Use sunscreen. Apply sunscreen liberally and repeatedly throughout the day. You should seek shade when your shadow is shorter than you. Protect yourself by wearing long sleeves, pants, a wide-brimmed hat, and sunglasses year round, whenever you are outdoors.  . Once a month, do a whole body skin exam, using a mirror to look at the skin on your back. Tell your health care provider of new moles, moles that have irregular borders, moles that are larger than a pencil eraser, or moles that have changed in shape or color.

## 2017-11-12 LAB — HEMOGLOBIN A1C
Est. average glucose Bld gHb Est-mCnc: 123 mg/dL
HEMOGLOBIN A1C: 5.9 % — AB (ref 4.8–5.6)

## 2017-11-12 LAB — TSH: TSH: 2.13 u[IU]/mL (ref 0.450–4.500)

## 2017-11-12 LAB — LIPID PANEL
CHOL/HDL RATIO: 3.4 ratio (ref 0.0–4.4)
Cholesterol, Total: 185 mg/dL (ref 100–199)
HDL: 54 mg/dL (ref >39)
LDL CALC: 111 mg/dL — AB (ref 0–99)
Triglycerides: 101 mg/dL (ref 0–149)
VLDL Cholesterol Cal: 20 mg/dL (ref 5–40)

## 2017-11-12 LAB — COMPREHENSIVE METABOLIC PANEL
ALBUMIN: 4.7 g/dL (ref 3.5–4.7)
ALK PHOS: 60 IU/L (ref 39–117)
ALT: 45 IU/L — ABNORMAL HIGH (ref 0–32)
AST: 35 IU/L (ref 0–40)
Albumin/Globulin Ratio: 2.1 (ref 1.2–2.2)
BILIRUBIN TOTAL: 0.7 mg/dL (ref 0.0–1.2)
BUN / CREAT RATIO: 14 (ref 12–28)
BUN: 11 mg/dL (ref 8–27)
CHLORIDE: 95 mmol/L — AB (ref 96–106)
CO2: 29 mmol/L (ref 20–29)
CREATININE: 0.8 mg/dL (ref 0.57–1.00)
Calcium: 10.7 mg/dL — ABNORMAL HIGH (ref 8.7–10.3)
GFR calc Af Amer: 79 mL/min/{1.73_m2} (ref >59)
GFR calc non Af Amer: 69 mL/min/{1.73_m2} (ref >59)
GLOBULIN, TOTAL: 2.2 g/dL (ref 1.5–4.5)
Glucose: 79 mg/dL (ref 65–99)
POTASSIUM: 3.9 mmol/L (ref 3.5–5.2)
SODIUM: 139 mmol/L (ref 134–144)
Total Protein: 6.9 g/dL (ref 6.0–8.5)

## 2017-11-12 LAB — CBC WITH DIFFERENTIAL/PLATELET
BASOS ABS: 0 10*3/uL (ref 0.0–0.2)
Basos: 0 %
EOS (ABSOLUTE): 0.3 10*3/uL (ref 0.0–0.4)
EOS: 4 %
HEMATOCRIT: 44.4 % (ref 34.0–46.6)
Hemoglobin: 15.5 g/dL (ref 11.1–15.9)
IMMATURE GRANULOCYTES: 0 %
Immature Grans (Abs): 0 10*3/uL (ref 0.0–0.1)
LYMPHS ABS: 1.1 10*3/uL (ref 0.7–3.1)
Lymphs: 18 %
MCH: 30.3 pg (ref 26.6–33.0)
MCHC: 34.9 g/dL (ref 31.5–35.7)
MCV: 87 fL (ref 79–97)
Monocytes Absolute: 0.7 10*3/uL (ref 0.1–0.9)
Monocytes: 11 %
NEUTROS PCT: 67 %
Neutrophils Absolute: 3.9 10*3/uL (ref 1.4–7.0)
PLATELETS: 163 10*3/uL (ref 150–450)
RBC: 5.11 x10E6/uL (ref 3.77–5.28)
RDW: 13.6 % (ref 12.3–15.4)
WBC: 5.9 10*3/uL (ref 3.4–10.8)

## 2017-11-12 LAB — VITAMIN D 25 HYDROXY (VIT D DEFICIENCY, FRACTURES): Vit D, 25-Hydroxy: 50.8 ng/mL (ref 30.0–100.0)

## 2017-11-18 ENCOUNTER — Ambulatory Visit: Payer: Self-pay | Admitting: Family Medicine

## 2017-11-25 ENCOUNTER — Ambulatory Visit (INDEPENDENT_AMBULATORY_CARE_PROVIDER_SITE_OTHER): Payer: Medicare Other | Admitting: Family Medicine

## 2017-11-25 ENCOUNTER — Encounter: Payer: Self-pay | Admitting: Family Medicine

## 2017-11-25 VITALS — BP 132/86 | HR 66 | Temp 98.3°F | Ht 65.5 in | Wt 206.2 lb

## 2017-11-25 DIAGNOSIS — I10 Essential (primary) hypertension: Secondary | ICD-10-CM | POA: Diagnosis not present

## 2017-11-25 DIAGNOSIS — R42 Dizziness and giddiness: Secondary | ICD-10-CM

## 2017-11-25 DIAGNOSIS — R7309 Other abnormal glucose: Secondary | ICD-10-CM

## 2017-11-25 DIAGNOSIS — E7800 Pure hypercholesterolemia, unspecified: Secondary | ICD-10-CM | POA: Diagnosis not present

## 2017-11-25 DIAGNOSIS — M949 Disorder of cartilage, unspecified: Secondary | ICD-10-CM | POA: Diagnosis not present

## 2017-11-25 DIAGNOSIS — E559 Vitamin D deficiency, unspecified: Secondary | ICD-10-CM | POA: Diagnosis not present

## 2017-11-25 DIAGNOSIS — M899 Disorder of bone, unspecified: Secondary | ICD-10-CM | POA: Diagnosis not present

## 2017-11-25 MED ORDER — CARVEDILOL 25 MG PO TABS: 25.0000 mg | ORAL_TABLET | Freq: Two times a day (BID) | ORAL | 3 refills | Status: DC

## 2017-11-25 NOTE — Assessment & Plan Note (Signed)
Vitamin D level is therapeutic with current supplementation Disc importance of this to bone and overall health  

## 2017-11-25 NOTE — Assessment & Plan Note (Addendum)
Pt thinks her dizzy spells are from diet - ? Chemical found in fruit  Some improvement  Has seen neurologist Desires ref to dietician

## 2017-11-25 NOTE — Assessment & Plan Note (Signed)
Disc goals for lipids and reasons to control them Rev last labs with pt Rev low sat fat diet in detail Stable with diet control  Enc exercise as tol

## 2017-11-25 NOTE — Assessment & Plan Note (Signed)
Last dexa nl  No fractures  Disc fall prev  Disc need for calcium/ vitamin D/ wt bearing exercise and bone density test every 2 y to monitor Disc safety/ fracture risk in detail

## 2017-11-25 NOTE — Assessment & Plan Note (Signed)
bp in fair control at this time  BP Readings from Last 1 Encounters:  11/25/17 132/86   No changes needed Most recent labs reviewed  Disc lifstyle change with low sodium diet and exercise

## 2017-11-25 NOTE — Assessment & Plan Note (Signed)
Lab Results  Component Value Date   HGBA1C 5.9 (H) 11/11/2017   slt improved disc imp of low glycemic diet and wt loss to prevent DM2

## 2017-11-25 NOTE — Progress Notes (Signed)
Subjective:    Patient ID: [name], female    DOB: [birth year], 84 y.o.   MRN: [id number]  HPI  Here for annual f/u of chronic health problems  Dealing with "stuff in her head" for 8 months -episodes of dizziness (with extreme fatigue)  Tried to change her diet  When it happens she cannot "do anything" - has to let it pass No fainting  Stopped tea- thinks that caused it twice  She thinks it is a food sensitivity  Also sens to scents/perfumes  Avoids these things and it helps so far  Saw neuro-they could not make a diagnosis - did recommend PT for balance and wanted her to wear a heart monitor for 30 d  (does not think she needs it due to the fact she has not had symptoms lately)    She has stopped driving   Wt Readings from Last 3 Encounters:  11/25/17 206 lb 4 oz (93.6 kg)  11/11/17 206 lb 8 oz (93.7 kg)  11/07/17 207 lb (93.9 kg)  stable weight  Cutting back on fruit  33.80 kg/m   Had amw 6/17  Noted one fall in may   Mammogram 9/17- needs to schedule this  Personal hx of breast and endomet cancer  Self breast exam-no lumps  No longer goes to the oncologist   dexa 8/15 normal range No fractures -this year  Ca and D D level 50.8  Colonoscopy 3/10 with hyperplastic polyps  Does not desire further screening  No GI changes   zostavax 3/09  bp is stable today  No cp or palpitations or headaches or edema  No side effects to medicines  BP Readings from Last 3 Encounters:  11/25/17 132/86  11/11/17 124/80  11/07/17 137/81      Hyperlipidemia  Lab Results  Component Value Date   CHOL 185 11/11/2017   CHOL 185 11/07/2016   CHOL 203 (H) 09/15/2015   Lab Results  Component Value Date   HDL 54 11/11/2017   HDL 56.00 11/07/2016   HDL 53.60 09/15/2015   Lab Results  Component Value Date   LDLCALC 111 (H) 11/11/2017   LDLCALC 112 (H) 11/07/2016   LDLCALC 131 (H) 09/15/2015   Lab Results  Component Value Date   TRIG 101 11/11/2017   TRIG 88.0  11/07/2016   TRIG 91.0 09/15/2015   Lab Results  Component Value Date   CHOLHDL 3.4 11/11/2017   CHOLHDL 3 11/07/2016   CHOLHDL 4 09/15/2015   Lab Results  Component Value Date   LDLDIRECT 162.4 03/18/2012   LDLDIRECT 147.6 03/16/2010  watches diet closely  LDL and HDL are stable  Not a lot of exercise ([facility]    Hyperglycemia  Lab Results  Component Value Date   HGBA1C 5.9 (H) 11/11/2017  down from 6.0  Fairly stable   Lab Results  Component Value Date   WBC 5.9 11/11/2017   HGB 15.5 11/11/2017   HCT 44.4 11/11/2017   MCV 87 11/11/2017   PLT 163 11/11/2017   Lab Results  Component Value Date   CREATININE 0.80 11/11/2017   BUN 11 11/11/2017   NA 139 11/11/2017   K 3.9 11/11/2017   CL 95 (L) 11/11/2017   CO2 29 11/11/2017   Lab Results  Component Value Date   ALT 45 (H) 11/11/2017   AST 35 11/11/2017   ALKPHOS 60 11/11/2017   BILITOT 0.7 11/11/2017    Ca 10.7  No hx of liver problems  No tylenol  Used to drink occ glass of wine- stopped that   Lab Results  Component Value Date   TSH 2.130 11/11/2017      Patient Active Problem List   Diagnosis Date Noted  . Dizziness 11/09/2017  . Episodic lightheadedness 03/22/2017  . Urinary frequency 10/15/2016  . Urge incontinence 10/15/2016  . Nonallopathic lesion of lumbar region 03/28/2015  . Nonallopathic lesion of thoracic region 03/28/2015  . Encounter for Medicare annual wellness exam 01/15/2014  . Colon cancer screening 01/15/2014  . Risk for falls 03/25/2012  . Dyspepsia 07/03/2011  . Vitamin D deficiency 08/29/2009  . DEPENDENT EDEMA, LEGS 09/07/2008  . CONSTIPATION 07/30/2008  . HYPERGLYCEMIA 07/09/2008  . History of endometrial cancer 10/28/2007  . HEARING LOSS, LEFT EAR 10/15/2007  . HEMORRHOIDS, INTERNAL 10/15/2007  . EXTERNAL HEMORRHOIDS 10/15/2007  . ESOPHAGEAL SPASM 10/15/2007  . DIVERTICULOSIS, COLON 10/15/2007  . Hyperlipidemia 10/07/2007  . Essential  hypertension 10/07/2007  . Disorder of bone and cartilage 06/28/2006  . CAROTID BRUITS, BILATERAL 11/25/2005  . AORTIC STENOSIS 10/26/2004  . HIATAL HERNIA 12/27/1995   Past Medical History:  Diagnosis Date  . Alcohol abuse, unspecified   . Aortic stenosis    s/p tissue AVR 05/2011  . Arthritis   . Breast CA ([place])    rt  . Diaphragmatic hernia without mention of obstruction or gangrene   . Diverticulosis of colon (without mention of hemorrhage)   . Dyskinesia of esophagus   . Endometrial cancer ([place])   . Family history of colonic polyps   . Heart murmur   . HLD (hyperlipidemia)   . HTN (hypertension)   . Internal hemorrhoids without mention of complication   . Malignant neoplasm of corpus uteri, except isthmus ([place])   . Other abnormal glucose   . Pain in joint, shoulder region   . Postmenopausal bleeding   . Tobacco use disorder   . Unspecified hearing loss   . Vertigo    Past Surgical History:  Procedure Laterality Date  . AORTIC VALVE REPLACEMENT  06/13/2011   Procedure: AORTIC VALVE REPLACEMENT (AVR);  Surgeon: Gaye Pollack, MD;  Location: [facility];  Service: Open Heart Surgery;  Laterality: N/A;  . 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  . BREAST LUMPECTOMY  11/2009   DCIS, neg sentinel lymph node biopsy  . CATARACT EXTRACTION     bil  . LAPAROSCOPY W/ TOTAL BILATERAL PELVIC AND PERI-AORTIC LYMPHADENECTOMY  11/04/2007  . PARAAORTIC LYMPHADENECTOMY    . ROTATOR CUFF REPAIR  05/2008   right  . TOTAL ABDOMINAL HYSTERECTOMY W/ BILATERAL SALPINGOOPHORECTOMY  11/04/07   salpingo-oophrectomy   Social History   Tobacco Use  . Smoking status: Former Smoker    Packs/day: 1.00    Last attempt to quit: 03/28/2000    Years since quitting: 17.6  . Smokeless tobacco: Never Used  Substance Use Topics  . Alcohol use: Yes    Alcohol/week: 0.0 oz    Comment: occasional wine   . Drug use: Never   Family History  Problem Relation Age of Onset  . Colon polyps Sister        2010  . Heart  disease Mother   . Heart attack Mother   . Stroke Father   . Heart failure Father   . Diabetes type II Father   . Diabetes type II Brother   . Cancer Maternal Aunt        possible post menopausal breast  cancer   Allergies  Allergen Reactions  . Calcium     REACTION: constipation  . Diovan [Valsartan]     REACTION: abdominal pain  . Ezetimibe     REACTION: fatigue  . Ramipril     REACTION: abdominal pain  . Simvastatin     REACTION: leg pain and body aches   Current Outpatient Medications on File Prior to Visit  Medication Sig Dispense Refill  . amLODipine (NORVASC) 5 MG tablet TAKE 1 TABLET (5 MG TOTAL) BY MOUTH DAILY. 90 tablet 2  . amoxicillin (AMOXIL) 500 MG capsule Take 4 capsules by mouth as directed. 1 hour prior to dental procedure  2  . aspirin 81 MG tablet Take 1 tablet (81 mg total) by mouth daily.    . Cholecalciferol (VITAMIN D) 1000 UNITS capsule Take 1 capsule (1,000 Units total) by mouth 2 (two) times daily. 180 capsule 3  . diphenoxylate-atropine (LOMOTIL) 2.5-0.025 MG tablet Take 1-2 tablets every 4-6 hours for diarrhea. 30 tablet 1  . hydrochlorothiazide (HYDRODIURIL) 25 MG tablet TAKE 1 TABLET BY MOUTH EVERY DAY 90 tablet 2  . hydrocortisone (ANUSOL-HC) 2.5 % rectal cream Place 1 application rectally 2 (two) times daily. 30 g 3  . KLOR-CON M20 20 MEQ tablet TAKE 1 TABLET (20 MEQ TOTAL) BY MOUTH DAILY. 90 tablet 3  . meclizine (ANTIVERT) 25 MG tablet Take 1 tablet (25 mg total) by mouth 3 (three) times daily as needed for dizziness. 30 tablet 0  . Omega-3 Fatty Acids (FISH OIL) 1000 MG CAPS Take 2,000 mg by mouth daily.     . Psyllium (METAMUCIL PO) Take 1 Dose by mouth as directed.     Marland Kitchen spironolactone (ALDACTONE) 25 MG tablet TAKE 1 TABLET (25 MG TOTAL) BY MOUTH DAILY. 90 tablet 2   No current facility-administered medications on file prior to visit.     Review of Systems  Constitutional: Negative for activity change, appetite change, fatigue, fever and  unexpected weight change.  HENT: Negative for congestion, ear pain, rhinorrhea, sinus pressure and sore throat.   Eyes: Negative for pain, redness and visual disturbance.  Respiratory: Negative for cough, shortness of breath and wheezing.   Cardiovascular: Negative for chest pain and palpitations.  Gastrointestinal: Negative for abdominal pain, blood in stool, constipation and diarrhea.  Endocrine: Negative for polydipsia and polyuria.  Genitourinary: Negative for dysuria, frequency and urgency.  Musculoskeletal: Negative for arthralgias, back pain and myalgias.  Skin: Negative for pallor and rash.  Allergic/Immunologic: Negative for environmental allergies.  Neurological: Positive for dizziness. Negative for tremors, seizures, syncope, speech difficulty, weakness and headaches.  Hematological: Negative for adenopathy. Does not bruise/bleed easily.  Psychiatric/Behavioral: Negative for decreased concentration and dysphoric mood. The patient is not nervous/anxious.       Objective:   Physical Exam  Constitutional: She appears well-developed and well-nourished. No distress.  obese and well appearing   HENT:  Head: Normocephalic and atraumatic.  Right Ear: External ear normal.  Left Ear: External ear normal.  Mouth/Throat: Oropharynx is clear and moist.  Eyes: Pupils are equal, round, and reactive to light. Conjunctivae and EOM are normal. No scleral icterus.  Neck: Normal range of motion. Neck supple. No JVD present. Carotid bruit is not present. No thyromegaly present.  Cardiovascular: Normal rate, regular rhythm and intact distal pulses. Exam reveals no gallop.  Murmur heard. Pulmonary/Chest: Effort normal and breath sounds normal. No respiratory distress. She has no wheezes. She exhibits no tenderness. No breast tenderness, discharge or bleeding.  Abdominal: Soft. Bowel sounds are normal. She exhibits no distension, no abdominal bruit and no mass. There is no tenderness.    Genitourinary:  Genitourinary Comments: Breast exam: No mass, nodules, thickening, tenderness, bulging, retraction, inflamation, nipple discharge or skin changes noted.  No axillary or clavicular LA.     Scar tissue noted on R under incision/baseline  Musculoskeletal: Normal range of motion. She exhibits no edema or tenderness.  No kyphosis   Lymphadenopathy:    She has no cervical adenopathy.  Neurological: She is alert. She has normal reflexes. No cranial nerve deficit. She exhibits normal muscle tone. Coordination normal.  Skin: Skin is warm and dry. No rash noted. No erythema. No pallor.  Fair complexion   Psychiatric: She has a normal mood and affect.  pleasant          Assessment & Plan:   Problem List Items Addressed This Visit      Cardiovascular and Mediastinum   Essential hypertension - Primary    bp in fair control at this time  BP Readings from Last 1 Encounters:  11/25/17 132/86   No changes needed Most recent labs reviewed  Disc lifstyle change with low sodium diet and exercise         Relevant Medications   carvedilol (COREG) 25 MG tablet     Musculoskeletal and Integument   Disorder of bone and cartilage    Last dexa nl  No fractures  Disc fall prev  Disc need for calcium/ vitamin D/ wt bearing exercise and bone density test every 2 y to monitor Disc safety/ fracture risk in detail          Other   Dizziness    Pt thinks her dizzy spells are from diet - ? Chemical found in fruit  Some improvement  Has seen neurologist Desires ref to dietician      HYPERGLYCEMIA    Lab Results  Component Value Date   HGBA1C 5.9 (H) 11/11/2017   slt improved disc imp of low glycemic diet and wt loss to prevent DM2       Hyperlipidemia    Disc goals for lipids and reasons to control them Rev last labs with pt Rev low sat fat diet in detail Stable with diet control  Enc exercise as tol       Relevant Medications   carvedilol (COREG) 25 MG tablet    Vitamin D deficiency    Vitamin D level is therapeutic with current supplementation Disc importance of this to bone and overall health

## 2017-11-25 NOTE — Patient Instructions (Addendum)
Don't forget to schedule your mammogram !   Think about some regular exercise to help your energy level   We will refer you to a dietician for concerns about food intolerance causing dizziness   We will continue to watch your labs   Try to eat a healthy diet  Try to get most of your carbohydrates from produce (with the exception of white potatoes)  Eat less bread/pasta/rice/snack foods/cereals/sweets and other items from the middle of the grocery store (processed carbs)  (if you avoid fruit then get your vegetables)

## 2017-12-20 ENCOUNTER — Encounter: Payer: Medicare Other | Attending: Family Medicine | Admitting: Dietician

## 2017-12-20 ENCOUNTER — Telehealth: Payer: Self-pay | Admitting: Dietician

## 2017-12-20 ENCOUNTER — Encounter: Payer: Self-pay | Admitting: Dietician

## 2017-12-20 DIAGNOSIS — R42 Dizziness and giddiness: Secondary | ICD-10-CM | POA: Diagnosis not present

## 2017-12-20 DIAGNOSIS — Z713 Dietary counseling and surveillance: Secondary | ICD-10-CM

## 2017-12-20 NOTE — Telephone Encounter (Signed)
Nutrition note follow up Called patient related to her appointment today for vertigo. Noted that Vitamin D deficiency and Vitamin B-12 deficiency can also be associated with vertigo.  Her vitamin D is now WNL.  Suggested that she consider getting her Vitamin B-12 level checked.  Antonieta Iba, RD, LDN, CDE

## 2017-12-20 NOTE — Progress Notes (Addendum)
Medical Nutrition Therapy:  Appt start time: 1030 end time:  1130.   Assessment:  Primary concerns today: Patient is here today alone.  She has been having a problem with vertigo.  She has seen an ENT, neurologist, cardiologist and general doctor and does not know the cause.  This started in October with N/V and dizziness.  Meclozine was prescribed but this did not help.  She stopped driving for a time due to this.  She feels that food contributes to this.  She has started eliminating food (raw plant foods-fruits, vegetables, nuts).  She states that she is a big fruit and vegetable eater.  She states that incident of dizziness happened after eating 1/2 cup blueberries.  It happened after 2 raisins, strawberries, tea, "if stomach is too empty".  She also stated that during this time she got severe fatigue.  When she eliminated these foods, she started to feel better.  Then she read an article in [employer] about a women being allergic to salicylates.  She states that cooking the vegetables helps this. Also, patient states that she has allergic symptoms with bread, cake and other things made with wheat which causes a tightening of the chest.  She can eat very small amounts of these.   Weight today 208 lbs overall stable. Vitamin D 50 11/11/17  Patient lives alone. She cooks for herself and enjoys this. She is retired from the [employer].  She drives a small amount.  Preferred Learning Style:   No preference indicated   Learning Readiness:   Ready  Change in progress   MEDICATIONS: see list to include vitamin D and omega 3   DIETARY INTAKE:  Everyday foods include Makes homemade stew/soup, fresh vegetables.  Avoided foods include added salt.  Soda, Wine, alcohol  24-hr recall:  B (6:30 AM): honey bunches of oats, 1% milk  Snk (9 AM): 1 egg omelet, 2 sausage links, yoplait lite yogurt  L ( PM): salad or fruit in the past but now leftovers OR baked sweet potato Snk ( PM): Ensure or 1% milk  or occasional popcorn D ( PM): fish or chicken, vegetables OR rare beef (1 time per month-steak), sugar free jello with canned fruit or rice pudding with sugar free syrup or halo ice cream Snk ( PM): none Beverages: water, 1% milk, coffee with half and half and splenda, V-8, OJ   Usual physical activity: Stopped with increased dizziness and misses this.  Used to go 5 times per week at [employer] and then [facility].  Progress Towards Goal(s):  In progress.   Nutritional Diagnosis:  NB-1.1 Food and nutrition-related knowledge deficit As related to concern of food allergies.  As evidenced by patient report.    Intervention:  Nutrition education completed.  Discussed that some of the symptoms such as stomach feeling empty and needing to eat soon after waking could be signs of low blood sugar and that dizziness could be a symptom of that.  Discussed how to avoid this and treat this.  Her Vertigo symptoms at times are very severe and I do not think that this is due to low blood sugar at those times.  Discussed the Salicylate intolerance.  She does not have specific symptoms of this but does seem to be reacting in a way to some of the foods that she eats. We discussed an elimination diet and to add a new food back every three days.  Discussed continued importance of eating balanced meals throughout the day.  Eat  or drink something if she feels symptoms.  Offered a Blood Sugar Meter for her to test if she felt symptomatic in the am before breakfast or in the afternoon between meals but patient did not appear interested at this time.  She could be teated for hypoglycemia if MD feels this would be helpful.  At this point, her symptoms overall have improved.  Also, data found on Salicylate intolerance did not state anything about cooked vs uncooked foods but Salicylate intolerance needs further research.  Patient is willing to continue a diet eliminating the foods that she has had symptoms with and add small amounts to see  if symptoms.  Vitamin D and Vitamin B-12 can also be associated with deficiency.  She has not had her Vitamin B-12 checked recently that I could determine from my records.  Called patient and stated to consider getting this checked.  Plan: Consider an elimination diet.  Keep a food diary.  When you start adding food back, do so every 3 days./ Be sure to continue to eat regularly.  Have a small amount of protein with each meal.  Could some of the symptoms be because your blood sugar is dipping?  Teaching Method Utilized:  Visual Auditory  Handouts given during visit include:  Salicylate Intolerance handout by AND  Hypoglycemia (unrelated to diabetes) handout  Signs and symptoms of hypoglycemia  Barriers to learning/adherence to lifestyle change: none  Demonstrated degree of understanding via:  Teach Back   Monitoring/Evaluation:  Dietary intake, exercise, and body weight prn.

## 2017-12-20 NOTE — Patient Instructions (Signed)
Consider an elimination diet.  Keep a food diary.  When you start adding food back, do so every 3 days./ Be sure to continue to eat regularly.  Have a small amount of protein with each meal.  Could some of the symptoms be because your blood sugar is dipping?

## 2018-01-20 DIAGNOSIS — Z23 Encounter for immunization: Secondary | ICD-10-CM | POA: Diagnosis not present

## 2018-03-05 ENCOUNTER — Encounter: Payer: Self-pay | Admitting: Cardiovascular Disease

## 2018-03-05 ENCOUNTER — Ambulatory Visit (INDEPENDENT_AMBULATORY_CARE_PROVIDER_SITE_OTHER): Payer: Medicare Other | Admitting: Cardiovascular Disease

## 2018-03-05 VITALS — BP 122/80 | HR 70 | Ht 65.0 in | Wt 209.0 lb

## 2018-03-05 DIAGNOSIS — I359 Nonrheumatic aortic valve disorder, unspecified: Secondary | ICD-10-CM

## 2018-03-05 DIAGNOSIS — R609 Edema, unspecified: Secondary | ICD-10-CM

## 2018-03-05 DIAGNOSIS — I5032 Chronic diastolic (congestive) heart failure: Secondary | ICD-10-CM | POA: Diagnosis not present

## 2018-03-05 NOTE — Progress Notes (Signed)
Cardiology Office Note:    Date:  03/05/2018   ID:  [name], DOB [birth year], MRN [id number]  PCP:  [name] Greenspan, MD  Cardiologist:  No primary care provider on file.  Electrophysiologist:  None   Referring MD: [name] Greenspan, MD   Chief Complaint  Patient presents with  . Fatigue   History of Present Illness:    [name] is a 84 y.o. female with a hx of aortic valve disease s/p bioprosthetic AVR in 2013, presenting for follow-up evaluation.   When I saw her last year, she was having a lot of trouble with dizziness and vertigo. She underwent ENT and neurologic evaluation, with no clear etiology discovered. She eventually saw a nutritionist and made some dietary changes. She did more research and stopped taking Aspirin. She feels strongly that discontinuation of aspirin has helped resolve her problems with dizziness.   The patient is here alone today.  Her primary complaint is generalized fatigue.  She also has mild exertional dyspnea.  She feels extremely fatigued.  Her leg swelling has worsened.  She denies orthopnea or PND.  She has had no heart palpitations or chest pain.  She is compliant with her medications.  Past Medical History:  Diagnosis Date  . Alcohol abuse, unspecified   . Aortic stenosis    s/p tissue AVR 05/2011  . Arthritis   . Breast CA ([place])    rt  . Diaphragmatic hernia without mention of obstruction or gangrene   . Diverticulosis of colon (without mention of hemorrhage)   . Dyskinesia of esophagus   . Endometrial cancer ([place])   . Family history of colonic polyps   . Heart murmur   . HLD (hyperlipidemia)   . HTN (hypertension)   . Internal hemorrhoids without mention of complication   . Malignant neoplasm of corpus uteri, except isthmus ([place])   . Other abnormal glucose   . Pain in joint, shoulder region   . Postmenopausal bleeding   . Tobacco use disorder   . Unspecified hearing loss   . Vertigo     Past Surgical History:  Procedure  Laterality Date  . AORTIC VALVE REPLACEMENT  06/13/2011   Procedure: AORTIC VALVE REPLACEMENT (AVR);  Surgeon: Gaye Pollack, MD;  Location: [facility];  Service: Open Heart Surgery;  Laterality: N/A;  . 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  . BREAST LUMPECTOMY  11/2009   DCIS, neg sentinel lymph node biopsy  . CATARACT EXTRACTION     bil  . LAPAROSCOPY W/ TOTAL BILATERAL PELVIC AND PERI-AORTIC LYMPHADENECTOMY  11/04/2007  . PARAAORTIC LYMPHADENECTOMY    . ROTATOR CUFF REPAIR  05/2008   right  . TOTAL ABDOMINAL HYSTERECTOMY W/ BILATERAL SALPINGOOPHORECTOMY  11/04/07   salpingo-oophrectomy    Current Medications: Current Meds  Medication Sig  . amLODipine (NORVASC) 5 MG tablet TAKE 1 TABLET (5 MG TOTAL) BY MOUTH DAILY.  Marland Kitchen amoxicillin (AMOXIL) 500 MG capsule Take 500 mg by mouth 3 (three) times daily.  . carvedilol (COREG) 25 MG tablet Take 1 tablet (25 mg total) by mouth 2 (two) times daily with a meal.  . Cholecalciferol (VITAMIN D) 1000 UNITS capsule Take 1 capsule (1,000 Units total) by mouth 2 (two) times daily.  . hydrochlorothiazide (HYDRODIURIL) 25 MG tablet TAKE 1 TABLET BY MOUTH EVERY DAY  . hydrocortisone (ANUSOL-HC) 2.5 % rectal cream Place 1 application rectally 2 (two) times daily.  Marland Kitchen KLOR-CON M20 20 MEQ tablet TAKE 1 TABLET (20 MEQ  TOTAL) BY MOUTH DAILY.  Marland Kitchen Omega-3 Fatty Acids (FISH OIL) 1000 MG CAPS Take 2,000 mg by mouth daily.   . Psyllium (METAMUCIL PO) Take 1 Dose by mouth as directed.   Marland Kitchen spironolactone (ALDACTONE) 25 MG tablet TAKE 1 TABLET (25 MG TOTAL) BY MOUTH DAILY.     Allergies:   Calcium; Diovan [valsartan]; Ezetimibe; Ramipril; and Simvastatin   Social History   Socioeconomic History  . Marital status: Widowed    Spouse name: Not on file  . Number of children: 2  . Years of education: Not on file  . Highest education level: High school graduate  Occupational History  . Not on file  Social Needs  . Financial resource strain: Not on file  . Food insecurity:     Worry: Not on file    Inability: Not on file  . Transportation needs:    Medical: Not on file    Non-medical: Not on file  Tobacco Use  . Smoking status: Former Smoker    Packs/day: 1.00    Last attempt to quit: 03/28/2000    Years since quitting: 17.9  . Smokeless tobacco: Never Used  Substance and Sexual Activity  . Alcohol use: Yes    Alcohol/week: 0.0 standard drinks    Comment: occasional wine   . Drug use: Never  . Sexual activity: Never    Birth control/protection: None  Lifestyle  . Physical activity:    Days per week: Not on file    Minutes per session: Not on file  . Stress: Not on file  Relationships  . Social connections:    Talks on phone: Not on file    Gets together: Not on file    Attends religious service: Not on file    Active member of club or organization: Not on file    Attends meetings of clubs or organizations: Not on file    Relationship status: Not on file  Other Topics Concern  . Not on file  Social History Narrative   Lives at home alone   Right handed   Caffeine: 1-2 cups daily     Family History: The patient's family history includes Cancer in her maternal aunt; Colon polyps in her sister; Diabetes type II in her brother and father; Heart attack in her mother; Heart disease in her mother; Heart failure in her father; Stroke in her father.  ROS:   Please see the history of present illness.    Positive for fatigue, dizziness, DOE. All other systems reviewed and are negative.  EKGs/Labs/Other Studies Reviewed:    The following studies were reviewed today: Echo 03-12-2017: Left ventricle:  The cavity size was normal. Wall thickness was increased in a pattern of mild LVH. Systolic function was normal. The estimated ejection fraction was in the range of 60% to 65%. Wall motion was normal; there were no regional wall motion abnormalities. Features are consistent with a pseudonormal left ventricular filling pattern, with concomitant abnormal  relaxation and increased filling pressure (grade 2 diastolic dysfunction).  ------------------------------------------------------------------- Aortic valve:   Structurally normal valve.   Cusp separation was normal.  Doppler:  Transvalvular velocity was within the normal range. There was no stenosis. There was no regurgitation.    VTI ratio of LVOT to aortic valve: 0.42. Valve area (VTI): 1.2 cm^2. Indexed valve area (VTI): 0.58 cm^2/m^2. Peak velocity ratio of LVOT to aortic valve: 0.38. Valve area (Vmax): 1.08 cm^2. Indexed valve area (Vmax): 0.52 cm^2/m^2. Mean velocity ratio of LVOT to aortic  valve: 0.44. Valve area (Vmean): 1.25 cm^2. Indexed valve area (Vmean): 0.61 cm^2/m^2.    Mean gradient (S): 12 mm Hg. Peak gradient (S): 26 mm Hg.  ------------------------------------------------------------------- Aorta:  Aortic root: The aortic root was normal in size. Ascending aorta: The ascending aorta was normal in size.  ------------------------------------------------------------------- Mitral valve:   Structurally normal valve.   Leaflet separation was normal.  Doppler:  Transvalvular velocity was within the normal range. There was no evidence for stenosis. There was trivial regurgitation.    Valve area by pressure half-time: 3.24 cm^2. Indexed valve area by pressure half-time: 1.57 cm^2/m^2.    Peak gradient (D): 6 mm Hg.  ------------------------------------------------------------------- Left atrium:  The atrium was normal in size.  ------------------------------------------------------------------- Right ventricle:  The cavity size was normal. Systolic function was normal.  ------------------------------------------------------------------- Pulmonic valve:    The valve appears to be grossly normal. Doppler:  There was trivial regurgitation.  ------------------------------------------------------------------- Tricuspid valve:   Structurally normal valve.   Leaflet  separation was normal.  Doppler:  Transvalvular velocity was within the normal range. There was trivial regurgitation.  ------------------------------------------------------------------- Pulmonary artery:   Systolic pressure was at the upper limits of normal.  ------------------------------------------------------------------- Right atrium:  The atrium was normal in size.   EKG:  EKG is ordered today.  The ekg ordered today demonstrates NSR 70 bpm, incomplete LBBB, LVH with QRS widening  Recent Labs: 11/11/2017: ALT 45; BUN 11; Creatinine, Ser 0.80; Hemoglobin 15.5; Platelets 163; Potassium 3.9; Sodium 139; TSH 2.130  Recent Lipid Panel    Component Value Date/Time   CHOL 185 11/11/2017 1114   TRIG 101 11/11/2017 1114   TRIG 58 05/13/2006 0933   HDL 54 11/11/2017 1114   CHOLHDL 3.4 11/11/2017 1114   CHOLHDL 3 11/07/2016 1044   VLDL 17.6 11/07/2016 1044   LDLCALC 111 (H) 11/11/2017 1114   LDLDIRECT 162.4 03/18/2012 0900    Physical Exam:    VS:  BP 122/80   Pulse 70   Ht 5\' 5"  (1.651 m)   Wt 209 lb (94.8 kg)   LMP 05/29/2007   BMI 34.78 kg/m     Wt Readings from Last 3 Encounters:  03/05/18 209 lb (94.8 kg)  12/20/17 203 lb (92.1 kg)  11/25/17 206 lb 4 oz (93.6 kg)     GEN:  Well nourished, well developed elderly woman in no acute distress HEENT: Normal NECK: No JVD; No carotid bruits LYMPHATICS: No lymphadenopathy CARDIAC: RRR, 2/6 systolic murmur at the RUSB RESPIRATORY:  Clear to auscultation without rales, wheezing or rhonchi  ABDOMEN: Soft, non-tender, non-distended MUSCULOSKELETAL:  2+ bilateral pretibial edema; No deformity  SKIN: Warm and dry NEUROLOGIC:  Alert and oriented x 3 PSYCHIATRIC:  Normal affect   ASSESSMENT:    1. Aortic valve disorder   2. Chronic diastolic heart failure (HCC)   3. Edema, unspecified type      PLAN:    In order of problems listed above:  1. The patient is now off of antiplatelet therapy.  She is not inclined  to restart aspirin at this time.  I would like to update her echocardiogram to assess progressive fatigue and shortness of breath.  Her cardiac exam is unchanged. 2. Volume status is somewhat difficult as she has chronic edema.  Will check an echocardiogram for further evaluation.  We discussed sodium restriction at length today. 3. She is given information on proper techniques for leg elevation.  She is unable to wear compression stockings.  We discussed sodium restriction.  I am going to give her a 1 month trial off of amlodipine to see if this makes a significant difference for her.   Medication Adjustments/Labs and Tests Ordered: Current medicines are reviewed at length with the patient today.  Concerns regarding medicines are outlined above.  Orders Placed This Encounter  Procedures  . EKG 12-Lead  . ECHOCARDIOGRAM COMPLETE   No orders of the defined types were placed in this encounter.   Patient Instructions  Medication Instructions:  Hold your amlodipine for one month and call us for an update.  Labwork: None  Testing/Procedures: Your provider has requested that you have an echocardiogram. Echocardiography is a painless test that uses sound waves to create images of your heart. It provides your doctor with information about the size and shape of your heart and how well your heart's chambers and valves are working. This procedure takes approximately one hour. There are no restrictions for this procedure. Your echo is scheduled Monday, October 14th at 1:00PM. Please arrive by 12:30PM.  Follow-Up: Your provider wants you to follow-up in: 1 year with Dr. Burt Knack. You will receive a reminder letter in the mail two months in advance. If you don't receive a letter, please call our office to schedule the follow-up appointment.    Any Other Special Instructions Will Be Listed Below (If Applicable). For your  leg edema you  should do  the following 1. Leg elevation - I recommend the Lounge  Dr. Leg rest.  See below for details  2. Salt restriction  -  Use potassium chloride instead of regular salt as a salt substitute. 3. Walk regularly 4. Compression hose - [facility] 5. Weight loss    Available on [email or web address] Or  Go to [email or web address]         Signed, Sherren Mocha, MD  03/05/2018 12:54 PM    [place]

## 2018-03-05 NOTE — Patient Instructions (Addendum)
Medication Instructions:  Hold your amlodipine for one month and call us for an update.  Labwork: None  Testing/Procedures: Your provider has requested that you have an echocardiogram. Echocardiography is a painless test that uses sound waves to create images of your heart. It provides your doctor with information about the size and shape of your heart and how well your heart's chambers and valves are working. This procedure takes approximately one hour. There are no restrictions for this procedure. Your echo is scheduled Monday, October 14th at 1:00PM. Please arrive by 12:30PM.  Follow-Up: Your provider wants you to follow-up in: 1 year with Dr. Burt Knack. You will receive a reminder letter in the mail two months in advance. If you don't receive a letter, please call our office to schedule the follow-up appointment.    Any Other Special Instructions Will Be Listed Below (If Applicable). For your  leg edema you  should do  the following 1. Leg elevation - I recommend the Lounge Dr. Leg rest.  See below for details  2. Salt restriction  -  Use potassium chloride instead of regular salt as a salt substitute. 3. Walk regularly 4. Compression hose - [facility] 5. Weight loss    Available on [email or web address] Or  Go to [email or web address]

## 2018-03-10 ENCOUNTER — Other Ambulatory Visit: Payer: Self-pay

## 2018-03-10 ENCOUNTER — Ambulatory Visit (HOSPITAL_COMMUNITY): Payer: Medicare Other | Attending: Cardiology

## 2018-03-10 DIAGNOSIS — I359 Nonrheumatic aortic valve disorder, unspecified: Secondary | ICD-10-CM

## 2018-03-28 ENCOUNTER — Other Ambulatory Visit: Payer: Self-pay | Admitting: Cardiovascular Disease

## 2018-04-30 ENCOUNTER — Ambulatory Visit: Payer: Medicare Other | Admitting: Cardiovascular Disease

## 2018-05-03 ENCOUNTER — Other Ambulatory Visit: Payer: Self-pay | Admitting: Cardiovascular Disease

## 2018-06-06 DIAGNOSIS — M5416 Radiculopathy, lumbar region: Secondary | ICD-10-CM | POA: Diagnosis not present

## 2018-06-06 DIAGNOSIS — M545 Low back pain: Secondary | ICD-10-CM | POA: Diagnosis not present

## 2018-06-06 DIAGNOSIS — M5136 Other intervertebral disc degeneration, lumbar region: Secondary | ICD-10-CM | POA: Diagnosis not present

## 2018-06-30 ENCOUNTER — Other Ambulatory Visit: Payer: Self-pay | Admitting: Cardiovascular Disease

## 2018-08-14 ENCOUNTER — Encounter: Payer: Self-pay | Admitting: Gastroenterology

## 2018-11-14 ENCOUNTER — Other Ambulatory Visit: Payer: Medicare Other

## 2018-11-19 ENCOUNTER — Ambulatory Visit (INDEPENDENT_AMBULATORY_CARE_PROVIDER_SITE_OTHER): Payer: Medicare Other

## 2018-11-19 ENCOUNTER — Encounter: Payer: Medicare Other | Admitting: Family Medicine

## 2018-11-19 DIAGNOSIS — Z0000 Encounter for general adult medical examination without abnormal findings: Secondary | ICD-10-CM | POA: Diagnosis not present

## 2018-11-19 NOTE — Progress Notes (Signed)
I reviewed health advisor's note, was available for consultation, and agree with documentation and plan.  

## 2018-11-19 NOTE — Progress Notes (Signed)
Subjective:   [name] is a 85 y.o. female who presents for Medicare Annual (Subsequent) preventive examination.  Review of Systems:  N/A Cardiac Risk Factors include: advanced age (>40men, >94 women);hypertension;obesity (BMI >30kg/m2)     Objective:     Vitals: LMP 05/29/2007   There is no height or weight on file to calculate BMI.  Advanced Directives 11/19/2018 12/20/2017 11/11/2017 03/06/2017 11/07/2016 09/15/2015 06/21/2014  Does Patient Have a Medical Advance Directive? Yes Yes Yes No Yes Yes No  Type of Advance Directive Healthcare Power of [place]vid -  Does patient want to make changes to medical advance directive? - - - - - No - Patient declined -  Copy of Effort in Chart? - - No - copy requested - No - copy requested No - copy requested -  Would patient like information on creating a medical advance directive? - - - - - - No - patient declined information  Pre-existing out of facility DNR order (yellow form or pink MOST form) - - - - - - -    Tobacco Social History   Tobacco Use  Smoking Status Former Smoker  . Packs/day: 1.00  . Quit date: 03/28/2000  . Years since quitting: 18.6  Smokeless Tobacco Never Used     Counseling given: No   Clinical Intake:     Pain : No/denies pain Pain Score: 0-No pain     Nutritional Status: BMI > 30  Obese Nutritional Risks: None  How often do you need to have someone help you when you read instructions, pamphlets, or other written materials from your doctor or pharmacy?: 1 - Never What is the last grade level you completed in school?: 12th grade  Interpreter Needed?: No  Information entered by :: LPinson, RN  Past Medical History:  Diagnosis Date  . Alcohol abuse, unspecified   . Aortic stenosis    s/p tissue AVR 05/2011  . Arthritis   . Breast CA ([place])    rt  . Diaphragmatic hernia without mention  of obstruction or gangrene   . Diverticulosis of colon (without mention of hemorrhage)   . Dyskinesia of esophagus   . Endometrial cancer ([place])   . Family history of colonic polyps   . Heart murmur   . HLD (hyperlipidemia)   . HTN (hypertension)   . Internal hemorrhoids without mention of complication   . Malignant neoplasm of corpus uteri, except isthmus ([place])   . Other abnormal glucose   . Pain in joint, shoulder region   . Postmenopausal bleeding   . Tobacco use disorder   . Unspecified hearing loss   . Vertigo    Past Surgical History:  Procedure Laterality Date  . AORTIC VALVE REPLACEMENT  06/13/2011   Procedure: AORTIC VALVE REPLACEMENT (AVR);  Surgeon: Gaye Pollack, MD;  Location: [facility];  Service: Open Heart Surgery;  Laterality: N/A;  . 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  . BREAST LUMPECTOMY  11/2009   DCIS, neg sentinel lymph node biopsy  . CATARACT EXTRACTION     bil  . LAPAROSCOPY W/ TOTAL BILATERAL PELVIC AND PERI-AORTIC LYMPHADENECTOMY  11/04/2007  . PARAAORTIC LYMPHADENECTOMY    . ROTATOR CUFF REPAIR  05/2008   right  . TOTAL ABDOMINAL HYSTERECTOMY W/ BILATERAL SALPINGOOPHORECTOMY  11/04/07   salpingo-oophrectomy   Family History  Problem Relation Age of Onset  . Colon polyps Sister  2010  . Heart disease Mother   . Heart attack Mother   . Stroke Father   . Heart failure Father   . Diabetes type II Father   . Diabetes type II Brother   . Cancer Maternal Aunt        possible post menopausal breast cancer   Social History   Socioeconomic History  . Marital status: Widowed    Spouse name: Not on file  . Number of children: 2  . Years of education: Not on file  . Highest education level: High school graduate  Occupational History  . Not on file  Social Needs  . Financial resource strain: Not on file  . Food insecurity    Worry: Not on file    Inability: Not on file  . Transportation needs    Medical: Not on file    Non-medical: Not on file  Tobacco  Use  . Smoking status: Former Smoker    Packs/day: 1.00    Quit date: 03/28/2000    Years since quitting: 18.6  . Smokeless tobacco: Never Used  Substance and Sexual Activity  . Alcohol use: Yes    Alcohol/week: 0.0 standard drinks    Comment: occasional wine   . Drug use: Never  . Sexual activity: Not Currently  Lifestyle  . Physical activity    Days per week: Not on file    Minutes per session: Not on file  . Stress: Not on file  Relationships  . Social Herbalist on phone: Not on file    Gets together: Not on file    Attends religious service: Not on file    Active member of club or organization: Not on file    Attends meetings of clubs or organizations: Not on file    Relationship status: Not on file  Other Topics Concern  . Not on file  Social History Narrative   Lives at home alone   Right handed   Caffeine: 1-2 cups daily    Outpatient Encounter Medications as of 11/19/2018  Medication Sig  . amLODipine (NORVASC) 5 MG tablet TAKE 1 TABLET (5 MG TOTAL) BY MOUTH DAILY.  Marland Kitchen amoxicillin (AMOXIL) 500 MG capsule Take 500 mg by mouth 3 (three) times daily.  . carvedilol (COREG) 25 MG tablet Take 1 tablet (25 mg total) by mouth 2 (two) times daily with a meal.  . Cholecalciferol (VITAMIN D) 1000 UNITS capsule Take 1 capsule (1,000 Units total) by mouth 2 (two) times daily.  . hydrochlorothiazide (HYDRODIURIL) 25 MG tablet TAKE 1 TABLET BY MOUTH EVERY DAY  . hydrocortisone (ANUSOL-HC) 2.5 % rectal cream Place 1 application rectally 2 (two) times daily.  Marland Kitchen KLOR-CON M20 20 MEQ tablet TAKE 1 TABLET (20 MEQ TOTAL) BY MOUTH DAILY.  Marland Kitchen Omega-3 Fatty Acids (FISH OIL) 1000 MG CAPS Take 2,000 mg by mouth daily.   . Psyllium (METAMUCIL PO) Take 1 Dose by mouth as directed.   Marland Kitchen spironolactone (ALDACTONE) 25 MG tablet TAKE 1 TABLET (25 MG TOTAL) BY MOUTH DAILY.   No facility-administered encounter medications on file as of 11/19/2018.     Activities of Daily Living In your  present state of health, do you have any difficulty performing the following activities: 11/19/2018  Hearing? Y  Vision? N  Difficulty concentrating or making decisions? N  Walking or climbing stairs? Y  Dressing or bathing? N  Doing errands, shopping? N  Preparing Food and eating ? N  Using the Toilet? N  In the past six months, have you accidently leaked urine? Y  Do you have problems with loss of bowel control? N  Managing your Medications? N  Managing your Finances? N  Housekeeping or managing your Housekeeping? N  Some recent data might be hidden    Patient Care Team: Tower, Wynelle Fanny, MD as PCP - Cyndia Diver, MD (Cardiology) Gaye Pollack, MD (Cardiothoracic Surgery) Donnamae Jude, MD as Consulting Physician (Obstetrics and Gynecology) Jodi Marble, MD as Consulting Physician (Otolaryngology) Lia Hopping, DDS as Consulting Physician (Dentistry) Luberta Mutter, MD as Consulting Physician (Ophthalmology)    Assessment:   This is a routine wellness examination for Valeen.   Hearing Screening   125Hz  250Hz  500Hz  1000Hz  2000Hz  3000Hz  4000Hz  6000Hz  8000Hz   Right ear:           Left ear:           Vision Screening Comments: Vision exam several years ago   Exercise Activities and Dietary recommendations Current Exercise Habits: The patient does not participate in regular exercise at present, Exercise limited by: orthopedic condition(s)  Goals    . Patient Stated     Starting 11/19/2018, I will continue to take medications as prescribed.       Fall Risk Fall Risk  11/19/2018 12/20/2017 11/11/2017 11/07/2016 09/15/2015  Falls in the past year? 0 Yes Yes No No  Comment - - Oct 15, 2017 fell due to dizziness - -  Number falls in past yr: - 1 1 - -  Injury with Fall? - No No - -  Risk for fall due to : - - Impaired balance/gait;Impaired mobility - -   Depression Screen PHQ 2/9 Scores 11/19/2018 12/20/2017 11/11/2017 11/07/2016  PHQ - 2 Score 0 0 0 0  PHQ- 9  Score 0 - 0 -     Cognitive Function MMSE - Mini Mental State Exam 11/19/2018 11/11/2017 11/07/2016 09/15/2015  Orientation to time 5 5 5 5   Orientation to Place 5 5 5 5   Registration 3 3 3 3   Attention/ Calculation 0 0 0 0  Recall 3 3 3 3   Language- name 2 objects 0 0 0 0  Language- repeat 1 1 1 1   Language- follow 3 step command 0 3 3 3   Language- read & follow direction 0 0 0 0  Write a sentence 0 0 0 0  Copy design 0 0 0 0  Total score 17 20 20 20      PLEASE NOTE: A Mini-Cog screen was completed. Maximum score is 17. A value of 0 denotes this part of [place] MMSE was not completed or the patient failed this part of the Mini-Cog screening.   Mini-Cog Screening Orientation to Time - Max 5 pts Orientation to Place - Max 5 pts Registration - Max 3 pts Recall - Max 3 pts Language Repeat - Max 1 pts      Immunization History  Administered Date(s) Administered  . Influenza Split 03/27/2011, 03/25/2012  . Influenza Whole 03/07/2007, 02/17/2008, 03/08/2009, 03/20/2010  . Influenza,inj,Quad PF,6+ Mos 03/06/2013, 02/23/2014, 02/24/2016  . Influenza-Unspecified 02/27/2015, 01/19/2017  . Pneumococcal Conjugate-13 01/15/2014  . Pneumococcal Polysaccharide-23 03/08/2009  . Td 05/29/1995, 03/08/2009  . Zoster 08/12/2007    Screening Tests Health Maintenance  Topic Date Due  . MAMMOGRAM  05/27/2020 (Originally 02/15/2017)  . INFLUENZA VACCINE  12/27/2018  . TETANUS/TDAP  03/09/2019  . DEXA SCAN  Completed  . PNA vac Low Risk Adult  Completed  Plan:     I have personally reviewed, addressed, and noted the following in the patient's chart:  A. Medical and social history B. Use of alcohol, tobacco or illicit drugs  C. Current medications and supplements D. Functional ability and status E.  Nutritional status F.  Physical activity G. Advance directives H. List of other physicians I.  Hospitalizations, surgeries, and ER visits in previous 12 months J.  Vitals (unless  it is a telemedicine encounter) K. Screenings to include cognitive, depression, hearing, vision (NOTE: hearing and vision screenings not completed in telemedicine encounter) L. Referrals and appointments   In addition, I have reviewed and discussed with patient certain preventive protocols, quality metrics, and best practice recommendations. A written personalized care plan for preventive services and recommendations were provided to patient.  With patient's permission, we connected on 11/19/18 at 11:00 AM EDT. Interactive audio and video telecommunications were attempted with patient. This attempt was unsuccessful due to patient having technical difficulties OR patient did not have access to video capability.  Encounter was completed with audio only.  Two patient identifiers were used to ensure the encounter occurred with the correct person. Patient was in home and writer was in office.     Signed,   Lindell Noe, MHA, BS, RN Health Coach

## 2018-11-19 NOTE — Progress Notes (Signed)
PCP notes:   Health maintenance:  Mammogram - to be determined  Abnormal screenings:   None  Patient concerns:   Swelling in bilateral legs/unable to wear compression stockings due to pain  Balance/unsteady gait/denies falls  Nurse concerns:  None  Next PCP appt:   12/01/18 @ 1045

## 2018-11-19 NOTE — Patient Instructions (Signed)
[name] , Thank you for taking time to come for your Medicare Wellness Visit. I appreciate your ongoing commitment to your health goals. Please review the following plan we discussed and let me know if I can assist you in the future.   These are the goals we discussed: Goals    . Patient Stated     Starting 11/19/2018, I will continue to take medications as prescribed.       This is a list of the screening recommended for you and due dates:  Health Maintenance  Topic Date Due  . Mammogram  05/27/2020*  . Flu Shot  12/27/2018  . Tetanus Vaccine  03/09/2019  . DEXA scan (bone density measurement)  Completed  . Pneumonia vaccines  Completed  *Topic was postponed. The date shown is not the original due date.   Preventive Care for Adults  A healthy lifestyle and preventive care can promote health and wellness. Preventive health guidelines for adults include the following key practices.  . A routine yearly physical is a good way to check with your health care provider about your health and preventive screening. It is a chance to share any concerns and updates on your health and to receive a thorough exam.  . Visit your dentist for a routine exam and preventive care every 6 months. Brush your teeth twice a day and floss once a day. Good oral hygiene prevents tooth decay and gum disease.  . The frequency of eye exams is based on your age, health, family medical history, use  of contact lenses, and other factors. Follow your health care provider's recommendations for frequency of eye exams.  . Eat a healthy diet. Foods like vegetables, fruits, whole grains, low-fat dairy products, and lean protein foods contain the nutrients you need without too many calories. Decrease your intake of foods high in solid fats, added sugars, and salt. Eat the right amount of calories for you. Get information about a proper diet from your health care provider, if necessary.  . Regular physical exercise is one  of the most important things you can do for your health. Most adults should get at least 150 minutes of moderate-intensity exercise (any activity that increases your heart rate and causes you to sweat) each week. In addition, most adults need muscle-strengthening exercises on 2 or more days a week.  Silver Sneakers may be a benefit available to you. To determine eligibility, you may visit the website: www.silversneakers.com or contact program at (940)332-1373 Mon-Fri between 8AM-8PM.   . Maintain a healthy weight. The body mass index (BMI) is a screening tool to identify possible weight problems. It provides an estimate of body fat based on height and weight. Your health care provider can find your BMI and can help you achieve or maintain a healthy weight.   For adults 20 years and older: ? A BMI below 18.5 is considered underweight. ? A BMI of 18.5 to 24.9 is normal. ? A BMI of 25 to 29.9 is considered overweight. ? A BMI of 30 and above is considered obese.   . Maintain normal blood lipids and cholesterol levels by exercising and minimizing your intake of saturated fat. Eat a balanced diet with plenty of fruit and vegetables. Blood tests for lipids and cholesterol should begin at age 35 and be repeated every 5 years. If your lipid or cholesterol levels are high, you are over 50, or you are at high risk for heart disease, you may need your cholesterol levels checked  more frequently. Ongoing high lipid and cholesterol levels should be treated with medicines if diet and exercise are not working.  . If you smoke, find out from your health care provider how to quit. If you do not use tobacco, please do not start.  . If you choose to drink alcohol, please do not consume more than 2 drinks per day. One drink is considered to be 12 ounces (355 mL) of beer, 5 ounces (148 mL) of wine, or 1.5 ounces (44 mL) of liquor.  . If you are 27-50 years old, ask your health care provider if you should take aspirin  to prevent strokes.  . Use sunscreen. Apply sunscreen liberally and repeatedly throughout the day. You should seek shade when your shadow is shorter than you. Protect yourself by wearing long sleeves, pants, a wide-brimmed hat, and sunglasses year round, whenever you are outdoors.  . Once a month, do a whole body skin exam, using a mirror to look at the skin on your back. Tell your health care provider of new moles, moles that have irregular borders, moles that are larger than a pencil eraser, or moles that have changed in shape or color.

## 2018-11-23 ENCOUNTER — Telehealth: Payer: Self-pay | Admitting: Family Medicine

## 2018-11-23 DIAGNOSIS — E559 Vitamin D deficiency, unspecified: Secondary | ICD-10-CM

## 2018-11-23 DIAGNOSIS — R7303 Prediabetes: Secondary | ICD-10-CM

## 2018-11-23 DIAGNOSIS — I10 Essential (primary) hypertension: Secondary | ICD-10-CM

## 2018-11-23 DIAGNOSIS — E7800 Pure hypercholesterolemia, unspecified: Secondary | ICD-10-CM

## 2018-11-23 NOTE — Telephone Encounter (Signed)
-----   Message from Ellamae Sia sent at 11/19/2018  3:21 PM EDT ----- Regarding: Lab orders for monday6.29.20 Patient is scheduled for CPX labs, please order future labs, Thanks , Karna Christmas

## 2018-11-24 ENCOUNTER — Other Ambulatory Visit (INDEPENDENT_AMBULATORY_CARE_PROVIDER_SITE_OTHER): Payer: Medicare Other

## 2018-11-24 ENCOUNTER — Other Ambulatory Visit: Payer: Self-pay

## 2018-11-24 ENCOUNTER — Other Ambulatory Visit: Payer: Medicare Other

## 2018-11-24 DIAGNOSIS — R7303 Prediabetes: Secondary | ICD-10-CM | POA: Diagnosis not present

## 2018-11-24 DIAGNOSIS — I10 Essential (primary) hypertension: Secondary | ICD-10-CM

## 2018-11-24 DIAGNOSIS — E559 Vitamin D deficiency, unspecified: Secondary | ICD-10-CM

## 2018-11-24 DIAGNOSIS — E7800 Pure hypercholesterolemia, unspecified: Secondary | ICD-10-CM | POA: Diagnosis not present

## 2018-11-24 LAB — COMPREHENSIVE METABOLIC PANEL
ALT: 37 U/L — ABNORMAL HIGH (ref 0–35)
AST: 29 U/L (ref 0–37)
Albumin: 4.6 g/dL (ref 3.5–5.2)
Alkaline Phosphatase: 60 U/L (ref 39–117)
BUN: 10 mg/dL (ref 6–23)
CO2: 29 mEq/L (ref 19–32)
Calcium: 10.4 mg/dL (ref 8.4–10.5)
Chloride: 99 mEq/L (ref 96–112)
Creatinine, Ser: 0.77 mg/dL (ref 0.40–1.20)
GFR: 71.57 mL/min (ref >60.00)
Glucose, Bld: 103 mg/dL — ABNORMAL HIGH (ref 70–99)
Potassium: 4 mEq/L (ref 3.5–5.1)
Sodium: 140 mEq/L (ref 135–145)
Total Bilirubin: 0.7 mg/dL (ref 0.2–1.2)
Total Protein: 6.9 g/dL (ref 6.0–8.3)

## 2018-11-24 LAB — CBC WITH DIFFERENTIAL/PLATELET
Basophils Absolute: 0 10*3/uL (ref 0.0–0.1)
Basophils Relative: 0.6 % (ref 0.0–3.0)
Eosinophils Absolute: 0.1 10*3/uL (ref 0.0–0.7)
Eosinophils Relative: 2.4 % (ref 0.0–5.0)
HCT: 45.6 % (ref 36.0–46.0)
Hemoglobin: 15.7 g/dL — ABNORMAL HIGH (ref 12.0–15.0)
Lymphocytes Relative: 18.3 % (ref 12.0–46.0)
Lymphs Abs: 1.1 10*3/uL (ref 0.7–4.0)
MCHC: 34.4 g/dL (ref 30.0–36.0)
MCV: 88.2 fl (ref 78.0–100.0)
Monocytes Absolute: 0.6 10*3/uL (ref 0.1–1.0)
Monocytes Relative: 10 % (ref 3.0–12.0)
Neutro Abs: 4 10*3/uL (ref 1.4–7.7)
Neutrophils Relative %: 68.7 % (ref 43.0–77.0)
Platelets: 145 10*3/uL — ABNORMAL LOW (ref 150.0–400.0)
RBC: 5.17 Mil/uL — ABNORMAL HIGH (ref 3.87–5.11)
RDW: 13.4 % (ref 11.5–15.5)
WBC: 5.9 10*3/uL (ref 4.0–10.5)

## 2018-11-24 LAB — LIPID PANEL
Cholesterol: 194 mg/dL (ref 0–200)
HDL: 54 mg/dL (ref >39.00)
LDL Cholesterol: 122 mg/dL — ABNORMAL HIGH (ref 0–99)
NonHDL: 140.16
Total CHOL/HDL Ratio: 4
Triglycerides: 90 mg/dL (ref 0.0–149.0)
VLDL: 18 mg/dL (ref 0.0–40.0)

## 2018-11-24 LAB — TSH: TSH: 2.83 u[IU]/mL (ref 0.35–4.50)

## 2018-11-24 LAB — VITAMIN D 25 HYDROXY (VIT D DEFICIENCY, FRACTURES): VITD: 51.16 ng/mL (ref 30.00–100.00)

## 2018-11-24 LAB — HEMOGLOBIN A1C: Hgb A1c MFr Bld: 6.2 % (ref 4.6–6.5)

## 2018-12-01 ENCOUNTER — Encounter: Payer: Self-pay | Admitting: Family Medicine

## 2018-12-01 ENCOUNTER — Other Ambulatory Visit: Payer: Self-pay

## 2018-12-01 ENCOUNTER — Ambulatory Visit (INDEPENDENT_AMBULATORY_CARE_PROVIDER_SITE_OTHER): Payer: Medicare Other | Admitting: Family Medicine

## 2018-12-01 VITALS — BP 125/70 | HR 80 | Temp 97.9°F | Ht 65.0 in | Wt 211.5 lb

## 2018-12-01 DIAGNOSIS — E7800 Pure hypercholesterolemia, unspecified: Secondary | ICD-10-CM

## 2018-12-01 DIAGNOSIS — M949 Disorder of cartilage, unspecified: Secondary | ICD-10-CM

## 2018-12-01 DIAGNOSIS — I5032 Chronic diastolic (congestive) heart failure: Secondary | ICD-10-CM

## 2018-12-01 DIAGNOSIS — I10 Essential (primary) hypertension: Secondary | ICD-10-CM

## 2018-12-01 DIAGNOSIS — E559 Vitamin D deficiency, unspecified: Secondary | ICD-10-CM | POA: Diagnosis not present

## 2018-12-01 DIAGNOSIS — R7303 Prediabetes: Secondary | ICD-10-CM | POA: Diagnosis not present

## 2018-12-01 DIAGNOSIS — R609 Edema, unspecified: Secondary | ICD-10-CM | POA: Diagnosis not present

## 2018-12-01 DIAGNOSIS — M899 Disorder of bone, unspecified: Secondary | ICD-10-CM | POA: Diagnosis not present

## 2018-12-01 MED ORDER — CARVEDILOL 25 MG PO TABS: 25.0000 mg | ORAL_TABLET | Freq: Two times a day (BID) | ORAL | 3 refills | Status: DC

## 2018-12-01 NOTE — Progress Notes (Signed)
Subjective:    Patient ID: [name], female    DOB: Jan 25, 1934, 85 y.o.   MRN: [id number]  HPI Pt presents for annual f/u of chronic health problems   Wt Readings from Last 3 Encounters:  12/01/18 211 lb 8 oz (95.9 kg)  03/05/18 209 lb (94.8 kg)  12/20/17 203 lb (92.1 kg)  was baking and eating too much sugar/butter Doing better now- lost 6 lb at home and doing better  35.20 kg/m   Staying in during the pandemic  Stays in  Family helps out   Feeling fair- chronic low energy / tired all the time  She changed her diet since her last labs (she is cutting back sugar and quit baking)  Cut back on butter  Feels better w/o the sugar -thinks this will help Also eating bigger meal mid day (not at night) -that helps a lot   She had amw on 6/24 Noted need for mammogram  Discussed balance (she is using her walker all the time) - she has had specialist attention for this and dizziness  Better in the house than outdoors   Stopping asa helped dizziness quite a bit- her cardiologist is aware   Has DD and aortic valve disorder  Chronic LE edema On hctz and aldactone She tried holding amlodipine -did not help so she started back Watched by cardiology Cannot tolerate supp hose (they hurt)  (she can wear knee highs with a bit of compression)   Remote hx of endometrial cancer No longer sees oncology   Colonoscopy 3/10  Bowel habits are regular   Mammogram 9/17- she will call and schedule her own  Self breast exam -no change (has scar tissue from past lumpectomy)   dexa 8/15- BMD in the normal range  H/o low D Taking supplement level is 51   Good! No falls or fractures   bp is up on first check today At home 130s /under 70s  No cp or palpitations or headaches or edema  No side effects to medicines  BP Readings from Last 3 Encounters:  12/01/18 (!) 146/68  03/05/18 122/80  11/25/17 132/86     Lab Results  Component Value Date   CREATININE 0.77 11/24/2018   BUN 10  11/24/2018   NA 140 11/24/2018   K 4.0 11/24/2018   CL 99 11/24/2018   CO2 29 11/24/2018   Lab Results  Component Value Date   WBC 5.9 11/24/2018   HGB 15.7 (H) 11/24/2018   HCT 45.6 11/24/2018   MCV 88.2 11/24/2018   PLT 145.0 (L) 11/24/2018   Lab Results  Component Value Date   ALT 37 (H) 11/24/2018   AST 29 11/24/2018   ALKPHOS 60 11/24/2018   BILITOT 0.7 11/24/2018    Lab Results  Component Value Date   TSH 2.83 11/24/2018     Hyperlipidemia Lab Results  Component Value Date   CHOL 194 11/24/2018   CHOL 185 11/11/2017   CHOL 185 11/07/2016   Lab Results  Component Value Date   HDL 54.00 11/24/2018   HDL 54 11/11/2017   HDL 56.00 11/07/2016   Lab Results  Component Value Date   LDLCALC 122 (H) 11/24/2018   LDLCALC 111 (H) 11/11/2017   LDLCALC 112 (H) 11/07/2016   Lab Results  Component Value Date   TRIG 90.0 11/24/2018   TRIG 101 11/11/2017   TRIG 88.0 11/07/2016   Lab Results  Component Value Date   CHOLHDL 4 11/24/2018   CHOLHDL  3.4 11/11/2017   CHOLHDL 3 11/07/2016   Lab Results  Component Value Date   LDLDIRECT 162.4 03/18/2012   LDLDIRECT 147.6 03/16/2010  had rxn to simvastatin in the past  Now eating better this week   Prediabetes Lab Results  Component Value Date   HGBA1C 6.2 11/24/2018   Up from 5.9 Now watching sugar and loosing weight   Patient Active Problem List   Diagnosis Date Noted  . Chronic diastolic heart failure ([place]) 12/01/2018  . Dizziness 11/09/2017  . Episodic lightheadedness 03/22/2017  . Urinary frequency 10/15/2016  . Urge incontinence 10/15/2016  . Nonallopathic lesion of lumbar region 03/28/2015  . Nonallopathic lesion of thoracic region 03/28/2015  . Encounter for Medicare annual wellness exam 01/15/2014  . Colon cancer screening 01/15/2014  . Risk for falls 03/25/2012  . Dyspepsia 07/03/2011  . Vitamin D deficiency 08/29/2009  . DEPENDENT EDEMA, LEGS 09/07/2008  . CONSTIPATION 07/30/2008  .  Prediabetes 07/09/2008  . History of endometrial cancer 10/28/2007  . HEARING LOSS, LEFT EAR 10/15/2007  . HEMORRHOIDS, INTERNAL 10/15/2007  . EXTERNAL HEMORRHOIDS 10/15/2007  . ESOPHAGEAL SPASM 10/15/2007  . DIVERTICULOSIS, COLON 10/15/2007  . Hyperlipidemia 10/07/2007  . Essential hypertension 10/07/2007  . CAROTID BRUITS, BILATERAL 11/25/2005  . AORTIC STENOSIS 10/26/2004  . HIATAL HERNIA 12/27/1995   Past Medical History:  Diagnosis Date  . Alcohol abuse, unspecified   . Aortic stenosis    s/p tissue AVR 05/2011  . Arthritis   . Breast CA ([place])    rt  . Diaphragmatic hernia without mention of obstruction or gangrene   . Diverticulosis of colon (without mention of hemorrhage)   . Dyskinesia of esophagus   . Endometrial cancer ([place])   . Family history of colonic polyps   . Heart murmur   . HLD (hyperlipidemia)   . HTN (hypertension)   . Internal hemorrhoids without mention of complication   . Malignant neoplasm of corpus uteri, except isthmus ([place])   . Other abnormal glucose   . Pain in joint, shoulder region   . Postmenopausal bleeding   . Tobacco use disorder   . Unspecified hearing loss   . Vertigo    Past Surgical History:  Procedure Laterality Date  . AORTIC VALVE REPLACEMENT  06/13/2011   Procedure: AORTIC VALVE REPLACEMENT (AVR);  Surgeon: Gaye Pollack, MD;  Location: [facility];  Service: Open Heart Surgery;  Laterality: N/A;  . 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  . BREAST LUMPECTOMY  11/2009   DCIS, neg sentinel lymph node biopsy  . CATARACT EXTRACTION     bil  . LAPAROSCOPY W/ TOTAL BILATERAL PELVIC AND PERI-AORTIC LYMPHADENECTOMY  11/04/2007  . PARAAORTIC LYMPHADENECTOMY    . ROTATOR CUFF REPAIR  05/2008   right  . TOTAL ABDOMINAL HYSTERECTOMY W/ BILATERAL SALPINGOOPHORECTOMY  11/04/07   salpingo-oophrectomy   Social History   Tobacco Use  . Smoking status: Former Smoker    Packs/day: 1.00    Quit date: 03/28/2000    Years since quitting: 18.6  . Smokeless  tobacco: Never Used  Substance Use Topics  . Alcohol use: Yes    Alcohol/week: 0.0 standard drinks    Comment: occasional wine   . Drug use: Never   Family History  Problem Relation Age of Onset  . Colon polyps Sister        2010  . Heart disease Mother   . Heart attack Mother   . Stroke Father   . Heart failure Father   .  Diabetes type II Father   . Diabetes type II Brother   . Cancer Maternal Aunt        possible post menopausal breast cancer   Allergies  Allergen Reactions  . Aspirin Other (See Comments)    dizzy  . Calcium     REACTION: constipation  . Diovan [Valsartan]     REACTION: abdominal pain  . Ezetimibe     REACTION: fatigue  . Ramipril     REACTION: abdominal pain  . Simvastatin     REACTION: leg pain and body aches   Current Outpatient Medications on File Prior to Visit  Medication Sig Dispense Refill  . amLODipine (NORVASC) 5 MG tablet TAKE 1 TABLET (5 MG TOTAL) BY MOUTH DAILY. 90 tablet 3  . amoxicillin (AMOXIL) 500 MG capsule Take 2,000 mg by mouth daily. Take prior to dental    . Cholecalciferol (VITAMIN D) 1000 UNITS capsule Take 1 capsule (1,000 Units total) by mouth 2 (two) times daily. 180 capsule 3  . hydrochlorothiazide (HYDRODIURIL) 25 MG tablet TAKE 1 TABLET BY MOUTH EVERY DAY 90 tablet 3  . hydrocortisone (ANUSOL-HC) 2.5 % rectal cream Place 1 application rectally 2 (two) times daily. 30 g 3  . KLOR-CON M20 20 MEQ tablet TAKE 1 TABLET (20 MEQ TOTAL) BY MOUTH DAILY. 90 tablet 3  . Omega-3 Fatty Acids (FISH OIL) 1000 MG CAPS Take 2,000 mg by mouth daily.     . Psyllium (METAMUCIL PO) Take 1 Dose by mouth as directed.     Marland Kitchen spironolactone (ALDACTONE) 25 MG tablet TAKE 1 TABLET (25 MG TOTAL) BY MOUTH DAILY. 90 tablet 3   No current facility-administered medications on file prior to visit.       Review of Systems  Constitutional: Negative for activity change, appetite change, fatigue, fever and unexpected weight change.  HENT: Negative for  congestion, ear pain, rhinorrhea, sinus pressure and sore throat.   Eyes: Negative for pain, redness and visual disturbance.  Respiratory: Negative for cough, shortness of breath and wheezing.   Cardiovascular: Negative for chest pain and palpitations.  Gastrointestinal: Negative for abdominal pain, blood in stool, constipation and diarrhea.  Endocrine: Negative for polydipsia and polyuria.  Genitourinary: Negative for dysuria, frequency and urgency.  Musculoskeletal: Negative for arthralgias, back pain and myalgias.  Skin: Negative for pallor and rash.  Allergic/Immunologic: Negative for environmental allergies.  Neurological: Negative for dizziness, syncope and headaches.       Poor balance  Hematological: Negative for adenopathy. Does not bruise/bleed easily.  Psychiatric/Behavioral: Negative for decreased concentration and dysphoric mood. The patient is not nervous/anxious.        Objective:   Physical Exam Constitutional:      General: She is not in acute distress.    Appearance: Normal appearance. She is well-developed. She is obese. She is not ill-appearing.  HENT:     Head: Normocephalic and atraumatic.     Right Ear: Tympanic membrane, ear canal and external ear normal.     Left Ear: Tympanic membrane, ear canal and external ear normal.     Ears:     Comments: Poor hearing w/o her hearing aides today    Nose: Nose normal.     Mouth/Throat:     Mouth: Mucous membranes are moist.     Pharynx: Oropharynx is clear.  Eyes:     General: No scleral icterus.    Conjunctiva/sclera: Conjunctivae normal.     Pupils: Pupils are equal, round, and reactive to light.  Neck:     Musculoskeletal: Normal range of motion and neck supple.     Thyroid: No thyromegaly.     Vascular: No carotid bruit or JVD.  Cardiovascular:     Rate and Rhythm: Normal rate and regular rhythm.     Heart sounds: Normal heart sounds. No gallop.   Pulmonary:     Effort: Pulmonary effort is normal. No  respiratory distress.     Breath sounds: Normal breath sounds. No wheezing.  Chest:     Chest wall: No tenderness.  Abdominal:     General: Bowel sounds are normal. There is no distension or abdominal bruit.     Palpations: Abdomen is soft. There is no mass.     Tenderness: There is no abdominal tenderness.  Genitourinary:    Comments: Breast exam: No mass, nodules, thickening, tenderness, bulging, retraction, inflamation, nipple discharge or skin changes noted.  No axillary or clavicular LA.     Scar on R breast is unchanged Musculoskeletal: Normal range of motion.        General: No tenderness.     Right lower leg: Edema present.     Left lower leg: Edema present.  Lymphadenopathy:     Cervical: No cervical adenopathy.  Skin:    General: Skin is warm and dry.     Coloration: Skin is not pale.     Findings: No erythema or rash.     Comments: Many sks diffusely  Neurological:     Mental Status: She is alert. Mental status is at baseline.     Cranial Nerves: No cranial nerve deficit.     Motor: No abnormal muscle tone.     Coordination: Coordination normal.     Gait: Gait normal.     Deep Tendon Reflexes: Reflexes are normal and symmetric. Reflexes normal.  Psychiatric:        Mood and Affect: Mood normal.        Cognition and Memory: Cognition and memory normal.           Assessment & Plan:   Problem List Items Addressed This Visit      Cardiovascular and Mediastinum   Essential hypertension - Primary    bp in fair control at this time  BP Readings from Last 1 Encounters:  12/01/18 125/70   No changes needed Most recent labs reviewed  Disc lifstyle change with low sodium diet and exercise        Relevant Medications   carvedilol (COREG) 25 MG tablet   Chronic diastolic heart failure (H[facility])    Overall stable/under care of cardiology      Relevant Medications   carvedilol (COREG) 25 MG tablet     Musculoskeletal and Integument   RESOLVED: Disorder of bone  and cartilage    dexa showed nl bmd in 2015  No falls or fx On ca and D Enc exercise         Other   Vitamin D deficiency    Vitamin D level is therapeutic with current supplementation Disc importance of this to bone and overall health Level of 51      Hyperlipidemia    Disc goals for lipids and reasons to control them Rev last labs with pt Rev low sat fat diet in detail Fairly stable -LDL up slightly Statin intolerant  Eating better now        Relevant Medications   carvedilol (COREG) 25 MG tablet   DEPENDENT EDEMA, LEGS    Holding amlodipine  did not help Wearing some knee highs with support-does not tolerate px ones Disc sodium and water intake She elevates feet      Prediabetes    Lab Results  Component Value Date   HGBA1C 6.2 11/24/2018   Now eating better disc imp of low glycemic diet and wt loss to prevent DM2

## 2018-12-01 NOTE — Patient Instructions (Addendum)
Don't forget to schedule her mammogram   Keep working on healthy diet  Try to get most of your carbohydrates from produce (with the exception of white potatoes)  Eat less bread/pasta/rice/snack foods/cereals/sweets and other items from the middle of the grocery store (processed carbs)  Exercise sitting or holding onto a chair is fine  Keep using walker when you need it   Take care of yourself  Continue cardiology follow up as well

## 2018-12-01 NOTE — Assessment & Plan Note (Signed)
bp in fair control at this time  BP Readings from Last 1 Encounters:  12/01/18 125/70   No changes needed Most recent labs reviewed  Disc lifstyle change with low sodium diet and exercise

## 2018-12-01 NOTE — Assessment & Plan Note (Signed)
dexa showed nl bmd in 2015  No falls or fx On ca and D Enc exercise

## 2018-12-01 NOTE — Assessment & Plan Note (Signed)
Lab Results  Component Value Date   HGBA1C 6.2 11/24/2018   Now eating better disc imp of low glycemic diet and wt loss to prevent DM2

## 2018-12-01 NOTE — Assessment & Plan Note (Signed)
Overall stable/under care of cardiology

## 2018-12-01 NOTE — Assessment & Plan Note (Signed)
Holding amlodipine did not help Wearing some knee highs with support-does not tolerate px ones Disc sodium and water intake She elevates feet

## 2018-12-01 NOTE — Assessment & Plan Note (Signed)
Vitamin D level is therapeutic with current supplementation Disc importance of this to bone and overall health Level of 51

## 2018-12-01 NOTE — Assessment & Plan Note (Signed)
Disc goals for lipids and reasons to control them Rev last labs with pt Rev low sat fat diet in detail Fairly stable -LDL up slightly Statin intolerant  Eating better now

## 2018-12-25 DIAGNOSIS — M419 Scoliosis, unspecified: Secondary | ICD-10-CM | POA: Diagnosis not present

## 2018-12-25 DIAGNOSIS — M5416 Radiculopathy, lumbar region: Secondary | ICD-10-CM | POA: Diagnosis not present

## 2018-12-25 DIAGNOSIS — M5136 Other intervertebral disc degeneration, lumbar region: Secondary | ICD-10-CM | POA: Diagnosis not present

## 2018-12-25 DIAGNOSIS — I739 Peripheral vascular disease, unspecified: Secondary | ICD-10-CM | POA: Diagnosis not present

## 2018-12-25 DIAGNOSIS — M545 Low back pain: Secondary | ICD-10-CM | POA: Diagnosis not present

## 2019-01-02 DIAGNOSIS — M545 Low back pain: Secondary | ICD-10-CM | POA: Diagnosis not present

## 2019-01-06 DIAGNOSIS — M545 Low back pain: Secondary | ICD-10-CM | POA: Diagnosis not present

## 2019-01-06 IMAGING — MR MR HEAD W/O CM
9 of 10 series · 34 of 48 positions shown · IV contrast (Yes MH)
Comparison: Prior MRI from 05/06/2009.

CLINICAL DATA: Initial evaluation for acute onset dizziness,
nausea, vomiting.

EXAM:
MRI HEAD WITHOUT CONTRAST
TECHNIQUE: Multiplanar, multiecho pulse sequences of the brain and surrounding
structures were obtained without intravenous contrast.

[Series 3: DWI · axial · 3.0mm · 0.94mm/px · z∈[-32,+114]mm · 8 of 100 slices shown (1 of 2)]
[im 1/100]
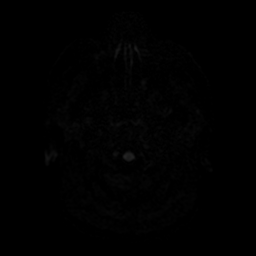
[im 12/100]
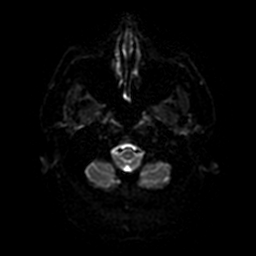
[im 34/100]
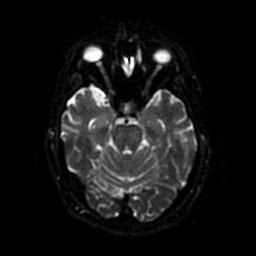
[im 45/100]
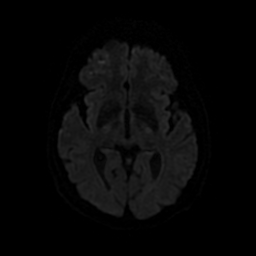
[im 56/100]
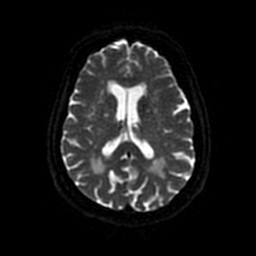
[im 67/100]
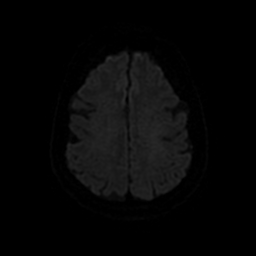
[im 89/100]
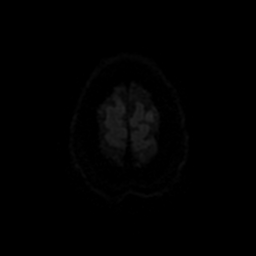
[im 100/100]
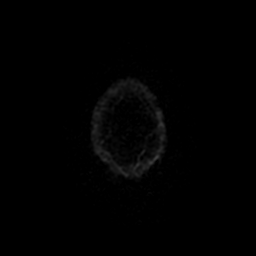

[Series 4: FLAIR · sagittal · 5.0mm · 0.47mm/px · 2 of 23 slices shown (1 of 2)]
[im 1/23]
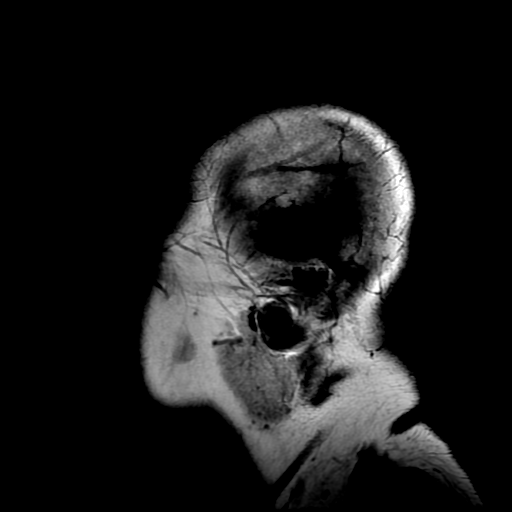
[im 23/23]
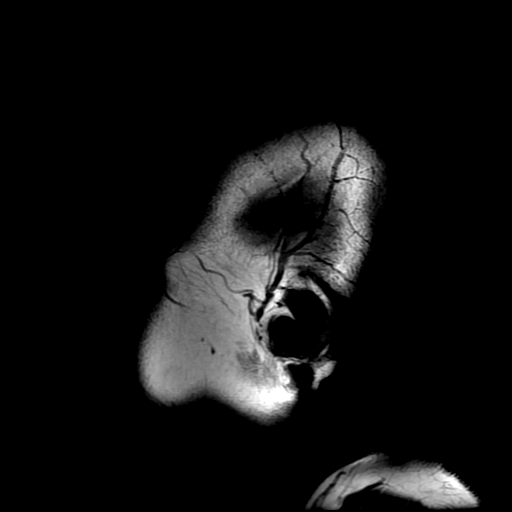

[Series 5: DWI · coronal · 4.0mm · 0.94mm/px · 7 of 72 slices shown (2 of 2)]
[im 1/72]
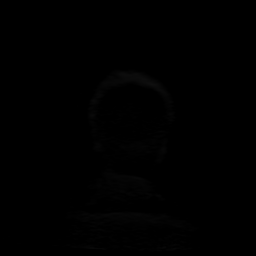
[im 12/72]
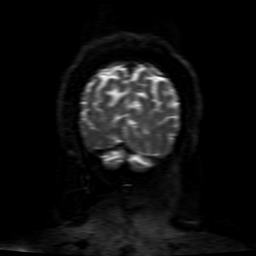
[im 24/72]
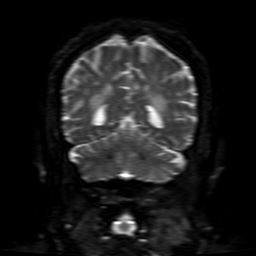
[im 36/72]
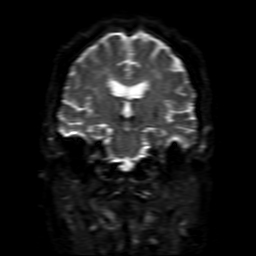
[im 48/72]
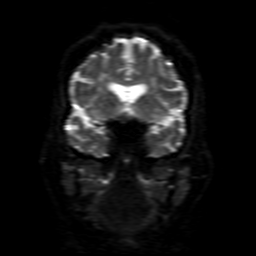
[im 60/72]
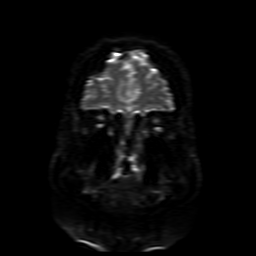
[im 72/72]
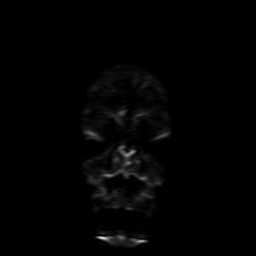

[Series 6: T2 · axial · 5.0mm · 0.43mm/px · z∈[-27,+119]mm · 2 of 26 slices shown (1 of 2)]
[im 1/26]
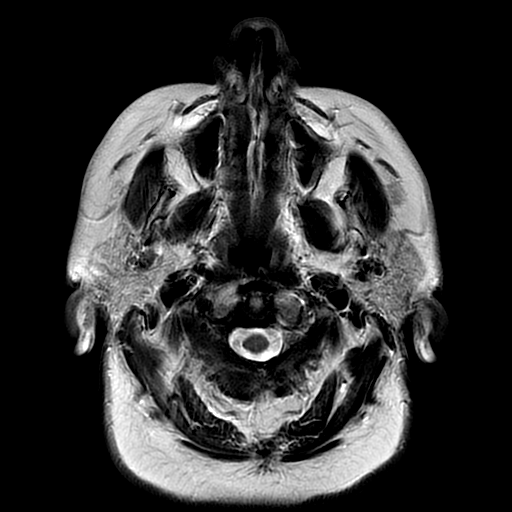
[im 26/26]
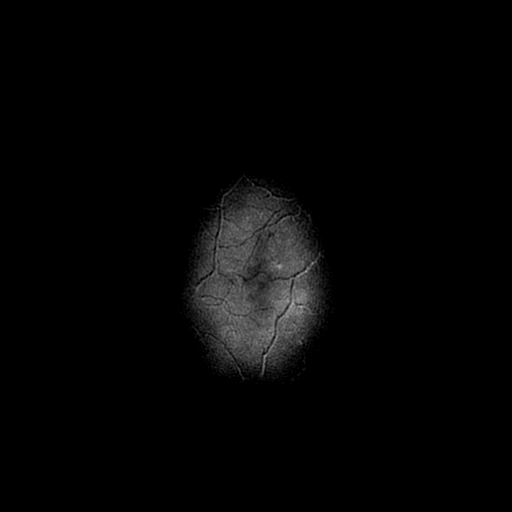

[Series 7: FLAIR · axial · 5.0mm · 0.43mm/px · z∈[-27,+119]mm · 2 of 26 slices shown (2 of 2)]
[im 1/26]
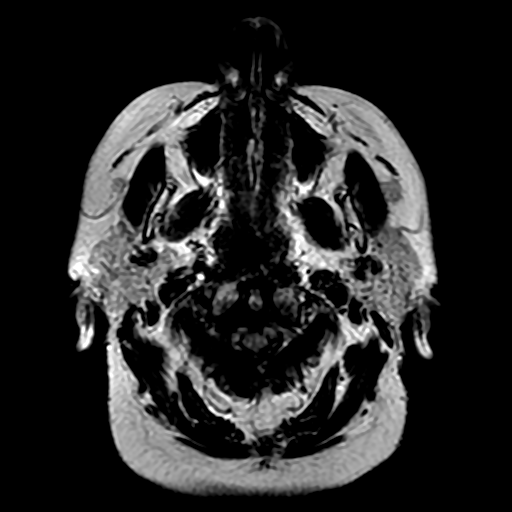
[im 26/26]
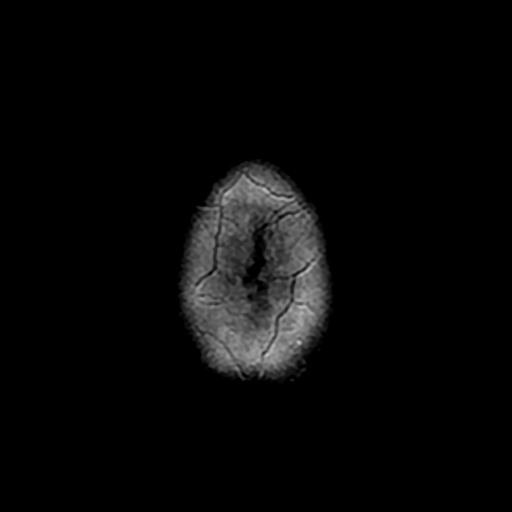

[Series 8: (person_name) · axial · 3.0mm · 0.47mm/px · z∈[-21,-5]mm · 2 of 96 slices shown]
[im 1/96]
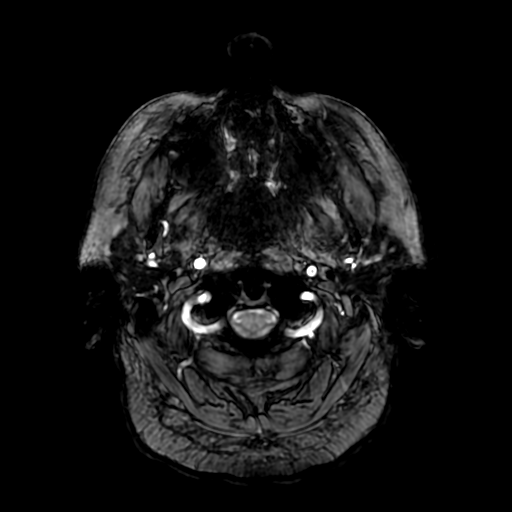
[im 12/96]
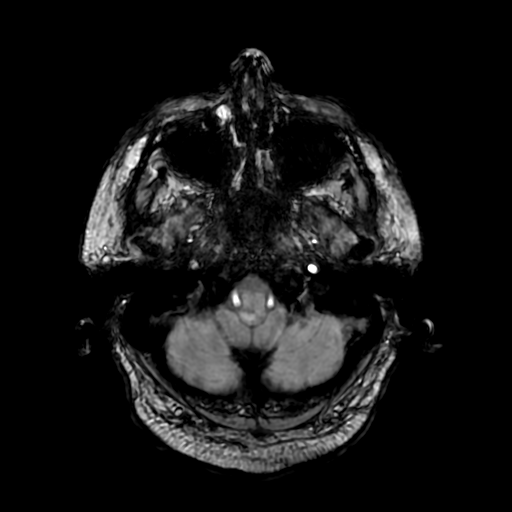

[Series 10: T2 · coronal · 5.0mm · 0.39mm/px · 3 of 28 slices shown (2 of 2)]
[im 1/28]
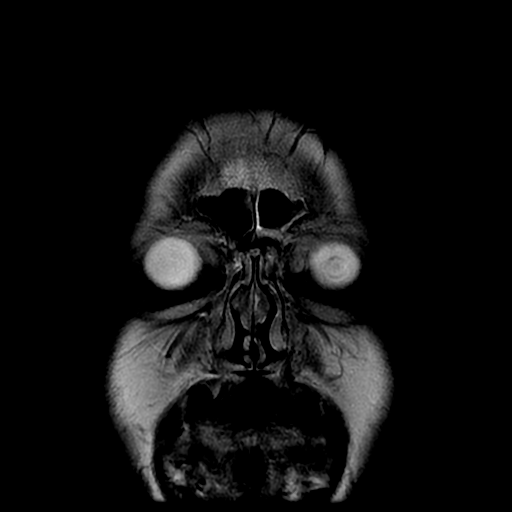
[im 14/28]
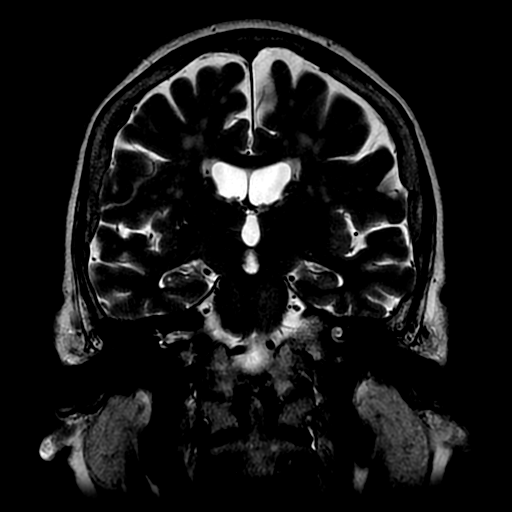
[im 28/28]
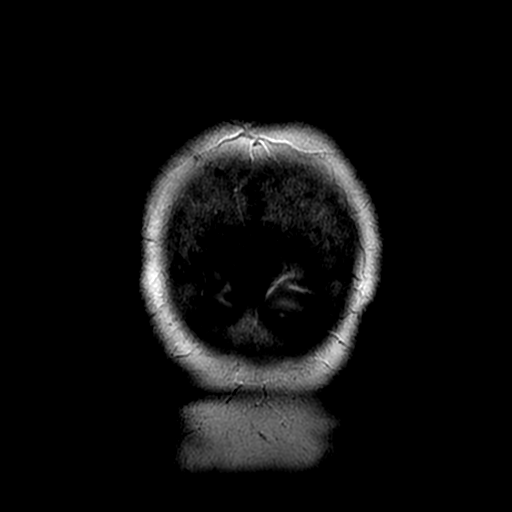

[Series 350: ADC · axial · 3.0mm · 0.94mm/px · z∈[-32,+114]mm · 5 of 50 slices shown (1 of 2)]
[im 1/50]
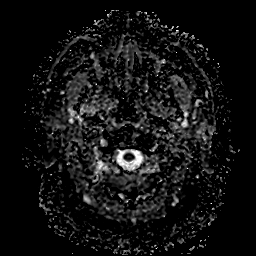
[im 13/50]
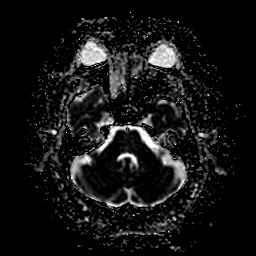
[im 25/50]
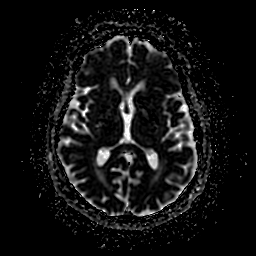
[im 37/50]
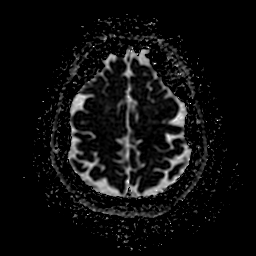
[im 50/50]
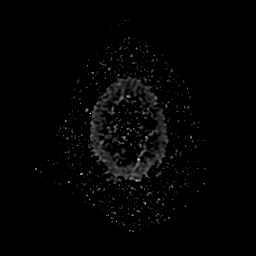

[Series 550: ADC · coronal · 4.0mm · 0.94mm/px · 3 of 36 slices shown (2 of 2)]
[im 1/36]
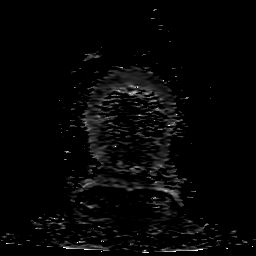
[im 18/36]
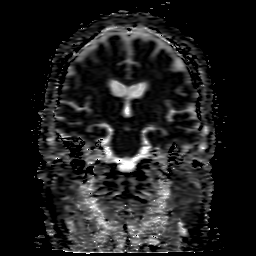
[im 36/36]
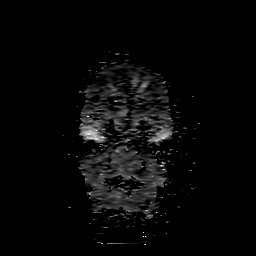

[34 of 48 positions shown; findings below may reference images not displayed]

FINDINGS: Brain: Diffuse prominence of the CSF containing spaces compatible
with generalized age-related cerebral atrophy. Patchy and confluent
T2/FLAIR hyperintensity within the periventricular and deep white
matter both cerebral hemispheres most consistent with chronic small
vascular disease, moderate to advanced in nature. Chronic
microvascular changes present within the pons as well.

No abnormal foci of restricted diffusion to suggest acute or
subacute ischemia. Gray-white matter differentiation maintained. No
encephalomalacia to suggest chronic infarction. No evidence for
acute intracranial hemorrhage. Single punctate chronic
microhemorrhage noted within the left temporal lobe, of doubtful
significance in isolation.

No mass lesion, midline shift or mass effect. No hydrocephalus. No
extra-axial fluid collection. Major dural sinuses are grossly
patent.

Vascular: Major intracranial vascular flow voids are maintained.

Skull and upper cervical spine: Craniocervical junction within
normal limits. Upper cervical spine normal. Bone marrow signal
intensity normal. Hyperostosis frontalis interna noted. No scalp
soft tissue abnormality.

Sinuses/Orbits: Globes and orbital soft tissues within normal
limits. Patient status post lens extraction bilaterally. Scattered
mucosal thickening and opacity noted within the frontal sinuses and
ethmoidal air cells. Paranasal sinuses otherwise largely clear. No
mastoid effusion. Inner ear structures normal.

Other: None.
IMPRESSION: 1. No acute intracranial abnormality identified.
2. Moderate to advanced cerebral white matter changes, most
consistent with chronic microvascular ischemic disease.

## 2019-01-09 DIAGNOSIS — M545 Low back pain: Secondary | ICD-10-CM | POA: Diagnosis not present

## 2019-01-09 DIAGNOSIS — M5126 Other intervertebral disc displacement, lumbar region: Secondary | ICD-10-CM | POA: Diagnosis not present

## 2019-01-09 DIAGNOSIS — M5416 Radiculopathy, lumbar region: Secondary | ICD-10-CM | POA: Diagnosis not present

## 2019-01-09 DIAGNOSIS — M5136 Other intervertebral disc degeneration, lumbar region: Secondary | ICD-10-CM | POA: Diagnosis not present

## 2019-01-09 DIAGNOSIS — M5380 Other specified dorsopathies, site unspecified: Secondary | ICD-10-CM | POA: Diagnosis not present

## 2019-01-09 DIAGNOSIS — M48061 Spinal stenosis, lumbar region without neurogenic claudication: Secondary | ICD-10-CM | POA: Diagnosis not present

## 2019-01-13 DIAGNOSIS — M545 Low back pain: Secondary | ICD-10-CM | POA: Diagnosis not present

## 2019-01-15 DIAGNOSIS — M545 Low back pain: Secondary | ICD-10-CM | POA: Diagnosis not present

## 2019-01-20 DIAGNOSIS — M545 Low back pain: Secondary | ICD-10-CM | POA: Diagnosis not present

## 2019-01-23 DIAGNOSIS — M545 Low back pain: Secondary | ICD-10-CM | POA: Diagnosis not present

## 2019-01-24 DIAGNOSIS — Z23 Encounter for immunization: Secondary | ICD-10-CM | POA: Diagnosis not present

## 2019-01-28 DIAGNOSIS — M545 Low back pain: Secondary | ICD-10-CM | POA: Diagnosis not present

## 2019-01-30 DIAGNOSIS — M545 Low back pain: Secondary | ICD-10-CM | POA: Diagnosis not present

## 2019-02-04 DIAGNOSIS — M545 Low back pain: Secondary | ICD-10-CM | POA: Diagnosis not present

## 2019-02-09 DIAGNOSIS — M545 Low back pain: Secondary | ICD-10-CM | POA: Diagnosis not present

## 2019-02-09 DIAGNOSIS — M5136 Other intervertebral disc degeneration, lumbar region: Secondary | ICD-10-CM | POA: Diagnosis not present

## 2019-02-09 DIAGNOSIS — M48061 Spinal stenosis, lumbar region without neurogenic claudication: Secondary | ICD-10-CM | POA: Diagnosis not present

## 2019-03-04 ENCOUNTER — Encounter: Payer: Self-pay | Admitting: Physician Assistant

## 2019-03-04 ENCOUNTER — Other Ambulatory Visit: Payer: Self-pay

## 2019-03-04 ENCOUNTER — Ambulatory Visit (INDEPENDENT_AMBULATORY_CARE_PROVIDER_SITE_OTHER): Payer: Medicare Other | Admitting: Physician Assistant

## 2019-03-04 VITALS — BP 124/68 | HR 70 | Ht 65.0 in | Wt 215.4 lb

## 2019-03-04 DIAGNOSIS — I10 Essential (primary) hypertension: Secondary | ICD-10-CM

## 2019-03-04 DIAGNOSIS — M7989 Other specified soft tissue disorders: Secondary | ICD-10-CM

## 2019-03-04 DIAGNOSIS — I359 Nonrheumatic aortic valve disorder, unspecified: Secondary | ICD-10-CM | POA: Diagnosis not present

## 2019-03-04 DIAGNOSIS — Z952 Presence of prosthetic heart valve: Secondary | ICD-10-CM | POA: Diagnosis not present

## 2019-03-04 DIAGNOSIS — E7800 Pure hypercholesterolemia, unspecified: Secondary | ICD-10-CM | POA: Diagnosis not present

## 2019-03-04 DIAGNOSIS — I5032 Chronic diastolic (congestive) heart failure: Secondary | ICD-10-CM | POA: Diagnosis not present

## 2019-03-04 NOTE — Patient Instructions (Signed)
Medication Instructions:   Your physician recommends that you continue on your current medications as directed. Please refer to the Current Medication list given to you today.  If you need a refill on your cardiac medications before your next appointment, please call your pharmacy.   Lab work:  None ordered  Testing/Procedures:  None ordered  Follow-Up: At [facility], you and your health needs are our priority.  As part of our continuing mission to provide you with exceptional heart care, we have created designated Provider Care Teams.  These Care Teams include your primary Cardiologist (physician) and Advanced Practice Providers (APPs -  Physician Assistants and Nurse Practitioners) who all work together to provide you with the care you need, when you need it. You will need a follow up appointment in:  12 months.  Please call our office 2 months in advance to schedule this appointment.  You may see Sherren Mocha, MD or one of the following Advanced Practice Providers on your designated Care Team: Richardson Dopp, PA-C [place], [place] . Daune Perch, NP

## 2019-03-04 NOTE — Progress Notes (Signed)
Cardiology Office Note:    Date:  03/04/2019   ID:  [name], DOB 1934/02/25, MRN [id number]  PCP:  Abner Greenspan, MD  Cardiologist:  Sherren Mocha, MD  Electrophysiologist:  None   Referring MD: Abner Greenspan, MD   Chief Complaint  Patient presents with  . Follow-up    AoV disease, hx of AVR    History of Present Illness:    [name] is a 85 y.o. female with:   Aortic stenosis  Status post bioprosthetic AVR 05/2011  Pt DC'd ASA 2/2 to dizziness   Chronic diastolic CHF  Breast cancer  Endometrial cancer  Hypertension  Hyperlipidemia  Leg edema  [name] was last seen by Dr. Burt Knack in October 2019. Marland KitchenShe returns for follow-up.  Overall, she is doing fairly well.  She has difficulty with her left leg.  She describes it as a "pinched nerve."  She also has difficulty with balance and walks with a walker.  She noted some minimal improvement with her leg swelling after stopping amlodipine.  She did try prescription strength compression hose.  However, this created considerable pain.  She did find a very light strength compression hose that was more tolerable.  She has not had chest pain or significant shortness of breath.  She has not had syncope.   Prior CV studies:   The following studies were reviewed today:  Echocardiogram 03/10/2018 Moderate LVH, EF 60-65, normal wall motion, grade 1 diastolic dysfunction, normally functioning bioprosthetic AVR (mean gradient 12, no paravalvular leak), mild MR, mild LAE  Carotid US 04/03/2017 Bilateral ICA 1-39; follow-up as needed  Echocardiogram 03/12/2017 Mild LVH, EF 60-65, normal wall motion, grade 2 diastolic dysfunction, normally functioning aortic valve prosthesis with mean gradient 12 mmHg  Cardiac catheterization 06/07/2011 No significant CAD  Past Medical History:  Diagnosis Date  . Alcohol abuse, unspecified   . Aortic stenosis    s/p tissue AVR 05/2011  . Arthritis   . Breast CA ([place])    rt  .  Diaphragmatic hernia without mention of obstruction or gangrene   . Diverticulosis of colon (without mention of hemorrhage)   . Dyskinesia of esophagus   . Endometrial cancer ([place])   . Family history of colonic polyps   . Heart murmur   . HLD (hyperlipidemia)   . HTN (hypertension)   . Internal hemorrhoids without mention of complication   . Malignant neoplasm of corpus uteri, except isthmus ([place])   . Other abnormal glucose   . Pain in joint, shoulder region   . Postmenopausal bleeding   . Tobacco use disorder   . Unspecified hearing loss   . Vertigo    Surgical Hx: The patient  has a past surgical history that includes Rotator cuff repair (05/2008); Laparoscopy w/ total bilateral pelvic and peri-aortic lymphadenectomy (11/04/2007); Total abdominal hysterectomy w/ bilateral salpingoophorectomy (11/04/07); Cataract extraction; Breast biopsy (5/11); Breast lumpectomy (11/2009); Aortic valve replacement (06/13/2011); and PARAAORTIC LYMPHADENECTOMY.   Current Medications: Current Meds  Medication Sig  . amoxicillin (AMOXIL) 500 MG capsule Take 2,000 mg by mouth daily. Take prior to dental  . carvedilol (COREG) 25 MG tablet Take 1 tablet (25 mg total) by mouth 2 (two) times daily with a meal.  . Cholecalciferol (VITAMIN D) 1000 UNITS capsule Take 1 capsule (1,000 Units total) by mouth 2 (two) times daily.  . hydrochlorothiazide (HYDRODIURIL) 25 MG tablet TAKE 1 TABLET BY MOUTH EVERY DAY  . hydrocortisone (ANUSOL-HC) 2.5 % rectal cream Place 1  application rectally 2 (two) times daily.  Marland Kitchen KLOR-CON M20 20 MEQ tablet TAKE 1 TABLET (20 MEQ TOTAL) BY MOUTH DAILY.  Marland Kitchen Omega-3 Fatty Acids (FISH OIL) 1000 MG CAPS Take 2,000 mg by mouth daily.   . Psyllium (METAMUCIL PO) Take 1 Dose by mouth as directed.   Marland Kitchen spironolactone (ALDACTONE) 25 MG tablet TAKE 1 TABLET (25 MG TOTAL) BY MOUTH DAILY.     Allergies:   Aspirin, Calcium, Diovan [valsartan], Ezetimibe, Ramipril, and Simvastatin   Social History    Tobacco Use  . Smoking status: Former Smoker    Packs/day: 1.00    Quit date: 03/28/2000    Years since quitting: 18.9  . Smokeless tobacco: Never Used  Substance Use Topics  . Alcohol use: Yes    Alcohol/week: 0.0 standard drinks    Comment: occasional wine   . Drug use: Never     Family Hx: The patient's family history includes Cancer in her maternal aunt; Colon polyps in her sister; Diabetes type II in her brother and father; Heart attack in her mother; Heart disease in her mother; Heart failure in her father; Stroke in her father.  ROS:   Please see the history of present illness.    ROS All other systems reviewed and are negative.   EKGs/Labs/Other Test Reviewed:    EKG:  EKG is  ordered today.  The ekg ordered today demonstrates normal sinus rhythm, heart rate 70, left axis deviation, LVH with IVCD, QTC 434, no change from prior tracing  Recent Labs: 11/24/2018: ALT 37; BUN 10; Creatinine, Ser 0.77; Hemoglobin 15.7; Platelets 145.0; Potassium 4.0; Sodium 140; TSH 2.83   Recent Lipid Panel Lab Results  Component Value Date/Time   CHOL 194 11/24/2018 11:11 AM   CHOL 185 11/11/2017 11:14 AM   TRIG 90.0 11/24/2018 11:11 AM   TRIG 58 05/13/2006 09:33 AM   HDL 54.00 11/24/2018 11:11 AM   HDL 54 11/11/2017 11:14 AM   CHOLHDL 4 11/24/2018 11:11 AM   LDLCALC 122 (H) 11/24/2018 11:11 AM   LDLCALC 111 (H) 11/11/2017 11:14 AM   LDLDIRECT 162.4 03/18/2012 09:00 AM    Physical Exam:    VS:  BP 124/68   Pulse 70   Ht 5\' 5"  (1.651 m)   Wt 215 lb 6.4 oz (97.7 kg)   LMP 05/29/2007   SpO2 96%   BMI 35.84 kg/m     Wt Readings from Last 3 Encounters:  03/04/19 215 lb 6.4 oz (97.7 kg)  12/01/18 211 lb 8 oz (95.9 kg)  03/05/18 209 lb (94.8 kg)     Physical Exam  Constitutional: She is oriented to person, place, and time. She appears well-developed and well-nourished. No distress.  HENT:  Head: Normocephalic and atraumatic.  Eyes: No scleral icterus.  Neck: No JVD  present. No thyromegaly present.  Cardiovascular: Normal rate and regular rhythm.  Murmur heard.  Crescendo-decrescendo systolic murmur is present with a grade of 2/6 at the upper right sternal border. Pulmonary/Chest: Effort normal and breath sounds normal. She has no rales.  Abdominal: Soft. There is no hepatomegaly.  Musculoskeletal:        General: Edema (2+ bilat LE edema) present.  Lymphadenopathy:    She has no cervical adenopathy.  Neurological: She is alert and oriented to person, place, and time.  Skin: Skin is warm and dry.  Psychiatric: She has a normal mood and affect.    ASSESSMENT & PLAN:    1. Aortic valve disorder 2. S/P AVR  Echocardiogram in October 2019 demonstrated normal LV function and a normally functioning bioprosthetic aortic valve replacement.  Mean gradient at time was 12.  She is no longer on aspirin.  She stopped this secondary to symptoms of dizziness.  Continue SBE prophylaxis.  3. Chronic diastolic heart failure (HCC) Volume assessment is somewhat difficult.  Her lungs are clear and neck veins are flat.  Her breathing seems to be fairly stable.  She has chronic lower extremity swelling.  She has a combination of diastolic heart failure as well as venous insufficiency.  Continue current medications.  4. Essential hypertension The patient's blood pressure is controlled on her current regimen.  Continue current therapy.  She can remain off of amlodipine.  5. Leg swelling As noted, she has venous insufficiency.  She has had minimal improvement with stopping amlodipine.  She did find a low strength compression hose that was more tolerable.  I have encouraged her to use this on a daily basis.   Dispo:  Return in about 1 year (around 03/03/2020) for Routine Follow Up, w/ Dr. Burt Knack, or Richardson Dopp, PA-C, in person.   Medication Adjustments/Labs and Tests Ordered: Current medicines are reviewed at length with the patient today.  Concerns regarding medicines  are outlined above.  Tests Ordered: Orders Placed This Encounter  Procedures  . EKG 12-Lead   Medication Changes: No orders of the defined types were placed in this encounter.   Signed, Richardson Dopp, PA-C  03/04/2019 2:22 PM    [facility], [place], [place]  [address] Phone: [phone]; Fax: [phone]

## 2019-03-31 ENCOUNTER — Other Ambulatory Visit: Payer: Self-pay | Admitting: Cardiovascular Disease

## 2019-05-28 ENCOUNTER — Encounter

## 2019-07-21 ENCOUNTER — Other Ambulatory Visit: Payer: Self-pay | Admitting: Cardiovascular Disease

## 2019-07-23 ENCOUNTER — Other Ambulatory Visit: Payer: Self-pay | Admitting: Cardiovascular Disease

## 2019-09-21 DIAGNOSIS — L720 Epidermal cyst: Secondary | ICD-10-CM | POA: Diagnosis not present

## 2019-09-21 DIAGNOSIS — L821 Other seborrheic keratosis: Secondary | ICD-10-CM | POA: Diagnosis not present

## 2019-09-21 DIAGNOSIS — I8311 Varicose veins of right lower extremity with inflammation: Secondary | ICD-10-CM | POA: Diagnosis not present

## 2019-09-21 DIAGNOSIS — I872 Venous insufficiency (chronic) (peripheral): Secondary | ICD-10-CM | POA: Diagnosis not present

## 2019-09-21 DIAGNOSIS — I8312 Varicose veins of left lower extremity with inflammation: Secondary | ICD-10-CM | POA: Diagnosis not present

## 2019-10-27 ENCOUNTER — Encounter: Payer: Self-pay | Admitting: Family Medicine

## 2019-10-27 DIAGNOSIS — L820 Inflamed seborrheic keratosis: Secondary | ICD-10-CM | POA: Diagnosis not present

## 2019-10-27 DIAGNOSIS — I8311 Varicose veins of right lower extremity with inflammation: Secondary | ICD-10-CM | POA: Diagnosis not present

## 2019-10-27 DIAGNOSIS — I8312 Varicose veins of left lower extremity with inflammation: Secondary | ICD-10-CM | POA: Diagnosis not present

## 2019-10-27 DIAGNOSIS — L720 Epidermal cyst: Secondary | ICD-10-CM | POA: Diagnosis not present

## 2019-10-27 DIAGNOSIS — I872 Venous insufficiency (chronic) (peripheral): Secondary | ICD-10-CM | POA: Diagnosis not present

## 2019-10-27 DIAGNOSIS — L84 Corns and callosities: Secondary | ICD-10-CM | POA: Diagnosis not present

## 2019-10-27 DIAGNOSIS — D485 Neoplasm of uncertain behavior of skin: Secondary | ICD-10-CM | POA: Diagnosis not present

## 2019-10-27 DIAGNOSIS — L821 Other seborrheic keratosis: Secondary | ICD-10-CM | POA: Diagnosis not present

## 2019-12-16 ENCOUNTER — Telehealth: Payer: Self-pay | Admitting: Family Medicine

## 2019-12-16 DIAGNOSIS — E559 Vitamin D deficiency, unspecified: Secondary | ICD-10-CM

## 2019-12-16 DIAGNOSIS — I10 Essential (primary) hypertension: Secondary | ICD-10-CM

## 2019-12-16 DIAGNOSIS — R7303 Prediabetes: Secondary | ICD-10-CM

## 2019-12-16 DIAGNOSIS — E7800 Pure hypercholesterolemia, unspecified: Secondary | ICD-10-CM

## 2019-12-16 NOTE — Telephone Encounter (Signed)
-----   Message from Ellamae Sia sent at 12/07/2019  2:50 PM EDT ----- Regarding: Lab orders for Thursday, 7.22.21  AWV lab orders, please.

## 2019-12-17 ENCOUNTER — Other Ambulatory Visit: Payer: Self-pay

## 2019-12-17 ENCOUNTER — Other Ambulatory Visit (INDEPENDENT_AMBULATORY_CARE_PROVIDER_SITE_OTHER): Payer: Medicare Other

## 2019-12-17 DIAGNOSIS — E559 Vitamin D deficiency, unspecified: Secondary | ICD-10-CM

## 2019-12-17 DIAGNOSIS — R7303 Prediabetes: Secondary | ICD-10-CM

## 2019-12-17 DIAGNOSIS — E7800 Pure hypercholesterolemia, unspecified: Secondary | ICD-10-CM

## 2019-12-17 DIAGNOSIS — I10 Essential (primary) hypertension: Secondary | ICD-10-CM | POA: Diagnosis not present

## 2019-12-17 LAB — COMPREHENSIVE METABOLIC PANEL
ALT: 33 U/L (ref 0–35)
AST: 23 U/L (ref 0–37)
Albumin: 4.6 g/dL (ref 3.5–5.2)
Alkaline Phosphatase: 49 U/L (ref 39–117)
BUN: 18 mg/dL (ref 6–23)
CO2: 33 mEq/L — ABNORMAL HIGH (ref 19–32)
Calcium: 10.3 mg/dL (ref 8.4–10.5)
Chloride: 99 mEq/L (ref 96–112)
Creatinine, Ser: 0.8 mg/dL (ref 0.40–1.20)
GFR: 68.31 mL/min (ref >60.00)
Glucose, Bld: 117 mg/dL — ABNORMAL HIGH (ref 70–99)
Potassium: 4.1 mEq/L (ref 3.5–5.1)
Sodium: 138 mEq/L (ref 135–145)
Total Bilirubin: 0.8 mg/dL (ref 0.2–1.2)
Total Protein: 6.9 g/dL (ref 6.0–8.3)

## 2019-12-17 LAB — CBC WITH DIFFERENTIAL/PLATELET
Basophils Absolute: 0 10*3/uL (ref 0.0–0.1)
Basophils Relative: 0.7 % (ref 0.0–3.0)
Eosinophils Absolute: 0.1 10*3/uL (ref 0.0–0.7)
Eosinophils Relative: 2.8 % (ref 0.0–5.0)
HCT: 42.9 % (ref 36.0–46.0)
Hemoglobin: 15 g/dL (ref 12.0–15.0)
Lymphocytes Relative: 19.8 % (ref 12.0–46.0)
Lymphs Abs: 1 10*3/uL (ref 0.7–4.0)
MCHC: 34.9 g/dL (ref 30.0–36.0)
MCV: 88.2 fl (ref 78.0–100.0)
Monocytes Absolute: 0.4 10*3/uL (ref 0.1–1.0)
Monocytes Relative: 7.8 % (ref 3.0–12.0)
Neutro Abs: 3.6 10*3/uL (ref 1.4–7.7)
Neutrophils Relative %: 68.9 % (ref 43.0–77.0)
Platelets: 134 10*3/uL — ABNORMAL LOW (ref 150.0–400.0)
RBC: 4.86 Mil/uL (ref 3.87–5.11)
RDW: 13.6 % (ref 11.5–15.5)
WBC: 5.2 10*3/uL (ref 4.0–10.5)

## 2019-12-17 LAB — LIPID PANEL
Cholesterol: 177 mg/dL (ref 0–200)
HDL: 50.9 mg/dL (ref >39.00)
LDL Cholesterol: 109 mg/dL — ABNORMAL HIGH (ref 0–99)
NonHDL: 126.43
Total CHOL/HDL Ratio: 3
Triglycerides: 88 mg/dL (ref 0.0–149.0)
VLDL: 17.6 mg/dL (ref 0.0–40.0)

## 2019-12-17 LAB — VITAMIN D 25 HYDROXY (VIT D DEFICIENCY, FRACTURES): VITD: 39.75 ng/mL (ref 30.00–100.00)

## 2019-12-17 LAB — HEMOGLOBIN A1C: Hgb A1c MFr Bld: 6 % (ref 4.6–6.5)

## 2019-12-17 LAB — TSH: TSH: 2.18 u[IU]/mL (ref 0.35–4.50)

## 2019-12-23 ENCOUNTER — Other Ambulatory Visit: Payer: Self-pay

## 2019-12-23 ENCOUNTER — Ambulatory Visit (INDEPENDENT_AMBULATORY_CARE_PROVIDER_SITE_OTHER): Payer: Medicare Other

## 2019-12-23 DIAGNOSIS — Z0000 Encounter for general adult medical examination without abnormal findings: Secondary | ICD-10-CM

## 2019-12-23 NOTE — Progress Notes (Signed)
PCP notes:  Health Maintenance: COVID- declined Tdap- insurance/financial Dexa- due   Abnormal Screenings: none   Patient concerns: Red, burning rash on bottom onset 2 weeks  Ago  Insomnia  No energy  Handicap form   Nurse concerns: none   Next PCP appt.: 12/25/2019 @ 2:30 pm

## 2019-12-23 NOTE — Patient Instructions (Signed)
[name] , Thank you for taking time to come for your Medicare Wellness Visit. I appreciate your ongoing commitment to your health goals. Please review the following plan we discussed and let me know if I can assist you in the future.   Screening recommendations/referrals: Colonoscopy: no longer required Mammogram: no longer required Bone Density: due, will have provider place order Recommended yearly ophthalmology/optometry visit for glaucoma screening and checkup Recommended yearly dental visit for hygiene and checkup  Vaccinations: Influenza vaccine: Up to date, completed 01/24/2019, due 12/2019 Pneumococcal vaccine: Completed series Tdap vaccine: decline- insurance/financial Shingles vaccine: due, check with your insurance regarding coverage   Covid-19:declined  Advanced directives: Advance directive discussed with you today. Even though you declined this today please call our office should you change your mind and we can give you the proper paperwork for you to fill out.  Conditions/risks identified: hypertension, hyperlipidemia  Next appointment: Follow up in one year for your annual wellness visit    Preventive Care 51 Years and Older, Female Preventive care refers to lifestyle choices and visits with your health care provider that can promote health and wellness. What does preventive care include?  A yearly physical exam. This is also called an annual well check.  Dental exams once or twice a year.  Routine eye exams. Ask your health care provider how often you should have your eyes checked.  Personal lifestyle choices, including:  Daily care of your teeth and gums.  Regular physical activity.  Eating a healthy diet.  Avoiding tobacco and drug use.  Limiting alcohol use.  Practicing safe sex.  Taking low-dose aspirin every day.  Taking vitamin and mineral supplements as recommended by your health care provider. What happens during an annual well check? The  services and screenings done by your health care provider during your annual well check will depend on your age, overall health, lifestyle risk factors, and family history of disease. Counseling  Your health care provider may ask you questions about your:  Alcohol use.  Tobacco use.  Drug use.  Emotional well-being.  Home and relationship well-being.  Sexual activity.  Eating habits.  History of falls.  Memory and ability to understand (cognition).  Work and work Statistician.  Reproductive health. Screening  You may have the following tests or measurements:  Height, weight, and BMI.  Blood pressure.  Lipid and cholesterol levels. These may be checked every 5 years, or more frequently if you are over 94 years old.  Skin check.  Lung cancer screening. You may have this screening every year starting at age 84 if you have a 30-pack-year history of smoking and currently smoke or have quit within the past 15 years.  Fecal occult blood test (FOBT) of the stool. You may have this test every year starting at age 84.  Flexible sigmoidoscopy or colonoscopy. You may have a sigmoidoscopy every 5 years or a colonoscopy every 10 years starting at age 84  Hepatitis C blood test.  Hepatitis B blood test.  Sexually transmitted disease (STD) testing.  Diabetes screening. This is done by checking your blood sugar (glucose) after you have not eaten for a while (fasting). You may have this done every 1-3 years.  Bone density scan. This is done to screen for osteoporosis. You may have this done starting at age 84  Mammogram. This may be done every 1-2 years. Talk to your health care provider about how often you should have regular mammograms. Talk with your health care provider about  your test results, treatment options, and if necessary, the need for more tests. Vaccines  Your health care provider may recommend certain vaccines, such as:  Influenza vaccine. This is recommended  every year.  Tetanus, diphtheria, and acellular pertussis (Tdap, Td) vaccine. You may need a Td booster every 10 years.  Zoster vaccine. You may need this after age 84  Pneumococcal 13-valent conjugate (PCV13) vaccine. One dose is recommended after age 84  Pneumococcal polysaccharide (PPSV23) vaccine. One dose is recommended after age 84 Talk to your health care provider about which screenings and vaccines you need and how often you need them. This information is not intended to replace advice given to you by your health care provider. Make sure you discuss any questions you have with your health care provider. Document Released: 06/10/2015 Document Revised: 02/01/2016 Document Reviewed: 03/15/2015 Elsevier Interactive Patient Education  2017 [place] Prevention in the Home Falls can cause injuries. They can happen to people of all ages. There are many things you can do to make your home safe and to help prevent falls. What can I do on the outside of my home?  Regularly fix the edges of walkways and driveways and fix any cracks.  Remove anything that might make you trip as you walk through a door, such as a raised step or threshold.  Trim any bushes or trees on the path to your home.  Use bright outdoor lighting.  Clear any walking paths of anything that might make someone trip, such as rocks or tools.  Regularly check to see if handrails are loose or broken. Make sure that both sides of any steps have handrails.  Any raised decks and porches should have guardrails on the edges.  Have any leaves, snow, or ice cleared regularly.  Use sand or salt on walking paths during winter.  Clean up any spills in your garage right away. This includes oil or grease spills. What can I do in the bathroom?  Use night lights.  Install grab bars by the toilet and in the tub and shower. Do not use towel bars as grab bars.  Use non-skid mats or decals in the tub or shower.  If  you need to sit down in the shower, use a plastic, non-slip stool.  Keep the floor dry. Clean up any water that spills on the floor as soon as it happens.  Remove soap buildup in the tub or shower regularly.  Attach bath mats securely with double-sided non-slip rug tape.  Do not have throw rugs and other things on the floor that can make you trip. What can I do in the bedroom?  Use night lights.  Make sure that you have a light by your bed that is easy to reach.  Do not use any sheets or blankets that are too big for your bed. They should not hang down onto the floor.  Have a firm chair that has side arms. You can use this for support while you get dressed.  Do not have throw rugs and other things on the floor that can make you trip. What can I do in the kitchen?  Clean up any spills right away.  Avoid walking on wet floors.  Keep items that you use a lot in easy-to-reach places.  If you need to reach something above you, use a strong step stool that has a grab bar.  Keep electrical cords out of the way.  Do not use floor polish or wax  that makes floors slippery. If you must use wax, use non-skid floor wax.  Do not have throw rugs and other things on the floor that can make you trip. What can I do with my stairs?  Do not leave any items on the stairs.  Make sure that there are handrails on both sides of the stairs and use them. Fix handrails that are broken or loose. Make sure that handrails are as long as the stairways.  Check any carpeting to make sure that it is firmly attached to the stairs. Fix any carpet that is loose or worn.  Avoid having throw rugs at the top or bottom of the stairs. If you do have throw rugs, attach them to the floor with carpet tape.  Make sure that you have a light switch at the top of the stairs and the bottom of the stairs. If you do not have them, ask someone to add them for you. What else can I do to help prevent falls?  Wear shoes  that:  Do not have high heels.  Have rubber bottoms.  Are comfortable and fit you well.  Are closed at the toe. Do not wear sandals.  If you use a stepladder:  Make sure that it is fully opened. Do not climb a closed stepladder.  Make sure that both sides of the stepladder are locked into place.  Ask someone to hold it for you, if possible.  Clearly mark and make sure that you can see:  Any grab bars or handrails.  First and last steps.  Where the edge of each step is.  Use tools that help you move around (mobility aids) if they are needed. These include:  Canes.  Walkers.  Scooters.  Crutches.  Turn on the lights when you go into a dark area. Replace any light bulbs as soon as they burn out.  Set up your furniture so you have a clear path. Avoid moving your furniture around.  If any of your floors are uneven, fix them.  If there are any pets around you, be aware of where they are.  Review your medicines with your doctor. Some medicines can make you feel dizzy. This can increase your chance of falling. Ask your doctor what other things that you can do to help prevent falls. This information is not intended to replace advice given to you by your health care provider. Make sure you discuss any questions you have with your health care provider. Document Released: 03/10/2009 Document Revised: 10/20/2015 Document Reviewed: 06/18/2014 Elsevier Interactive Patient Education  2017 [employer].

## 2019-12-23 NOTE — Progress Notes (Addendum)
Subjective:   [name] is a 86 y.o. female who presents for Medicare Annual (Subsequent) preventive examination.  Review of System: N/A     I connected with the patient today by telephone and verified that I am speaking with the correct person using two identifiers. Location patient: home Location nurse: work Persons participating in the virtual visit: patient, Marine scientist.   I discussed the limitations, risks, security and privacy concerns of performing an evaluation and management service by telephone and the availability of in person appointments. I also discussed with the patient that there may be a patient responsible charge related to this service. The patient expressed understanding and verbally consented to this telephonic visit.    Interactive audio and video telecommunications were attempted between this nurse and patient, however failed, due to patient having technical difficulties OR patient did not have access to video capability.  We continued and completed visit with audio only.     Cardiac Risk Factors include: advanced age (>15men, >83 women);hypertension;dyslipidemia     Objective:    Today's Vitals   There is no height or weight on file to calculate BMI.  Advanced Directives 12/23/2019 11/19/2018 12/20/2017 11/11/2017 03/06/2017 11/07/2016 09/15/2015  Does Patient Have a Medical Advance Directive? No Yes Yes Yes No Yes Yes  Type of Advance Directive - Healthcare Power of [place]  Does patient want to make changes to medical advance directive? - - - - - - No - Patient declined  Copy of [place] in Chart? - - - No - copy requested - No - copy requested No - copy requested  Would patient like information on creating a medical advance directive? No - Patient declined - - - - - -  Pre-existing out of facility DNR order (yellow form or pink MOST form) - - - - - - -      Current Medications (verified) Outpatient Encounter Medications as of 12/23/2019  Medication Sig  . amoxicillin (AMOXIL) 500 MG capsule Take 2,000 mg by mouth daily. Take prior to dental  . carvedilol (COREG) 25 MG tablet Take 1 tablet (25 mg total) by mouth 2 (two) times daily with a meal.  . Cholecalciferol (VITAMIN D) 1000 UNITS capsule Take 1 capsule (1,000 Units total) by mouth 2 (two) times daily.  . hydrochlorothiazide (HYDRODIURIL) 25 MG tablet TAKE 1 TABLET BY MOUTH EVERY DAY  . hydrocortisone (ANUSOL-HC) 2.5 % rectal cream Place 1 application rectally 2 (two) times daily.  Marland Kitchen KLOR-CON M20 20 MEQ tablet TAKE 1 TABLET BY MOUTH EVERY DAY  . Omega-3 Fatty Acids (FISH OIL) 1000 MG CAPS Take 2,000 mg by mouth daily.   . Psyllium (METAMUCIL PO) Take 1 Dose by mouth as directed.   Marland Kitchen spironolactone (ALDACTONE) 25 MG tablet TAKE 1 TABLET (25 MG TOTAL) BY MOUTH DAILY.   No facility-administered encounter medications on file as of 12/23/2019.    Allergies (verified) Aspirin, Calcium, Diovan [valsartan], Ezetimibe, Ramipril, and Simvastatin   History: Past Medical History:  Diagnosis Date  . Alcohol abuse, unspecified   . Aortic stenosis    s/p tissue AVR 05/2011  . Arthritis   . Breast CA ([place])    rt  . Diaphragmatic hernia without mention of obstruction or gangrene   . Diverticulosis of colon (without mention of hemorrhage)   . Dyskinesia of esophagus   . Endometrial cancer ([place])   . Family history of  colonic polyps   . Heart murmur   . HLD (hyperlipidemia)   . HTN (hypertension)   . Internal hemorrhoids without mention of complication   . Malignant neoplasm of corpus uteri, except isthmus ([place])   . Other abnormal glucose   . Pain in joint, shoulder region   . Postmenopausal bleeding   . Tobacco use disorder   . Unspecified hearing loss   . Vertigo    Past Surgical History:  Procedure Laterality Date  . AORTIC VALVE REPLACEMENT  06/13/2011   Procedure: AORTIC VALVE  REPLACEMENT (AVR);  Surgeon: Gaye Pollack, MD;  Location: [facility];  Service: Open Heart Surgery;  Laterality: N/A;  . 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  . BREAST LUMPECTOMY  11/2009   DCIS, neg sentinel lymph node biopsy  . CATARACT EXTRACTION     bil  . LAPAROSCOPY W/ TOTAL BILATERAL PELVIC AND PERI-AORTIC LYMPHADENECTOMY  11/04/2007  . PARAAORTIC LYMPHADENECTOMY    . ROTATOR CUFF REPAIR  05/2008   right  . TOTAL ABDOMINAL HYSTERECTOMY W/ BILATERAL SALPINGOOPHORECTOMY  11/04/07   salpingo-oophrectomy   Family History  Problem Relation Age of Onset  . Colon polyps Sister        2010  . Heart disease Mother   . Heart attack Mother   . Stroke Father   . Heart failure Father   . Diabetes type II Father   . Diabetes type II Brother   . Cancer Maternal Aunt        possible post menopausal breast cancer   Social History   Socioeconomic History  . Marital status: Widowed    Spouse name: Not on file  . Number of children: 2  . Years of education: Not on file  . Highest education level: High school graduate  Occupational History  . Not on file  Tobacco Use  . Smoking status: Former Smoker    Packs/day: 1.00    Quit date: 03/28/2000    Years since quitting: 19.7  . Smokeless tobacco: Never Used  Vaping Use  . Vaping Use: Never used  Substance and Sexual Activity  . Alcohol use: Not Currently    Alcohol/week: 0.0 standard drinks  . Drug use: Never  . Sexual activity: Not Currently  Other Topics Concern  . Not on file  Social History Narrative   Lives at home alone   Right handed   Caffeine: 1-2 cups daily   Social Determinants of Health   Financial Resource Strain: Low Risk   . Difficulty of Paying Living Expenses: Not hard at all  Food Insecurity: No Food Insecurity  . Worried About Charity fundraiser in the Last Year: Never true  . Ran Out of Food in the Last Year: Never true  Transportation Needs: No Transportation Needs  . Lack of Transportation (Medical): No  . Lack of  Transportation (Non-Medical): No  Physical Activity: Inactive  . Days of Exercise per Week: 0 days  . Minutes of Exercise per Session: 0 min  Stress: No Stress Concern Present  . Feeling of Stress : Not at all  Social Connections:   . Frequency of Communication with Friends and Family:   . Frequency of Social Gatherings with Friends and Family:   . Attends Religious Services:   . Active Member of Clubs or Organizations:   . Attends Archivist Meetings:   Marland Kitchen Marital Status:     Tobacco Counseling Counseling given: Not Answered   Clinical Intake:  Pre-visit preparation  completed: Yes  Pain : 0-10 Pain Type: Chronic pain Pain Location: Back Pain Descriptors / Indicators: Aching Pain Onset: More than a month ago Pain Frequency: Intermittent     Nutritional Risks: None Diabetes: No  How often do you need to have someone help you when you read instructions, pamphlets, or other written materials from your doctor or pharmacy?: 1 - Never What is the last grade level you completed in school?: 12th  Diabetic: No Nutrition Risk Assessment:  Has the patient had any N/V/D within the last 2 months?  No  Does the patient have any non-healing wounds?  No  Has the patient had any unintentional weight loss or weight gain?  No   Diabetes:  Is the patient diabetic?  No  If diabetic, was a CBG obtained today?  No  Did the patient bring in their glucometer from home?  No  How often do you monitor your CBG's? N/A.   Financial Strains and Diabetes Management:  Are you having any financial strains with the device, your supplies or your medication? N/A.  Does the patient want to be seen by Chronic Care Management for management of their diabetes?  N/A Would the patient like to be referred to a Nutritionist or for Diabetic Management?  N/A     Interpreter Needed?: No  Information entered by :: CJohnson, LPN   Activities of Daily Living In your present state of health,  do you have any difficulty performing the following activities: 12/23/2019  Hearing? Y  Comment wears hearing aids  Vision? N  Difficulty concentrating or making decisions? Y  Comment some short term memory changes  Walking or climbing stairs? N  Dressing or bathing? N  Doing errands, shopping? N  Preparing Food and eating ? N  Using the Toilet? N  In the past six months, have you accidently leaked urine? Y  Comment wears a pad  Do you have problems with loss of bowel control? N  Managing your Medications? N  Managing your Finances? N  Housekeeping or managing your Housekeeping? N  Some recent data might be hidden    Patient Care Team: Tower, Wynelle Fanny, MD as PCP - Cyndia Diver, MD as PCP - Cardiology (Cardiology) Gaye Pollack, MD (Cardiothoracic Surgery) Donnamae Jude, MD as Consulting Physician (Obstetrics and Gynecology) Jodi Marble, MD as Consulting Physician (Otolaryngology) Lia Hopping, DDS as Consulting Physician (Dentistry) Luberta Mutter, MD as Consulting Physician (Ophthalmology)  Indicate any recent Medical Services you may have received from other than [facility] providers in the past year (date may be approximate).     Assessment:   This is a routine wellness examination for [name].  Hearing/Vision screen  Hearing Screening   125Hz  250Hz  500Hz  1000Hz  2000Hz  3000Hz  4000Hz  6000Hz  8000Hz   Right ear:           Left ear:           Vision Screening Comments: Patient gets annual eye exams   Dietary issues and exercise activities discussed: Current Exercise Habits: The patient does not participate in regular exercise at present, Exercise limited by: None identified  Goals    . Patient Stated     Starting 11/19/2018, I will continue to take medications as prescribed.    . Patient Stated     12/23/2019, I will maintain and continue medications as prescribed.       Depression Screen PHQ 2/9 Scores 12/23/2019 11/19/2018 12/20/2017 11/11/2017 11/07/2016  09/15/2015 01/15/2014  PHQ - 2 Score  0 0 0 0 0 0 0  PHQ- 9 Score 0 0 - 0 - - -    Fall Risk Fall Risk  12/23/2019 11/19/2018 12/20/2017 11/11/2017 11/07/2016  Falls in the past year? 0 0 Yes Yes No  Comment - - - Oct 15, 2017 fell due to dizziness -  Number falls in past yr: 0 - 1 1 -  Injury with Fall? 0 - No No -  Risk for fall due to : Medication side effect;Impaired balance/gait - - Impaired balance/gait;Impaired mobility -  Follow up Falls evaluation completed;Falls prevention discussed - - - -    Any stairs in or around the home? Yes  If so, are there any without handrails? No  Home free of loose throw rugs in walkways, pet beds, electrical cords, etc? Yes  Adequate lighting in your home to reduce risk of falls? Yes   ASSISTIVE DEVICES UTILIZED TO PREVENT FALLS:  Life alert? No  Use of a cane, walker or w/c? Yes  Grab bars in the bathroom? Yes  Shower chair or bench in shower? No  Elevated toilet seat or a handicapped toilet? No   TIMED UP AND GO:  Was the test performed? N/A, telephonic visit.    Cognitive Function: MMSE - Mini Mental State Exam 12/23/2019 11/19/2018 11/11/2017 11/07/2016 09/15/2015  Orientation to time 5 5 5 5 5   Orientation to Place 5 5 5 5 5   Registration 3 3 3 3 3   Attention/ Calculation 5 0 0 0 0  Recall 3 3 3 3 3   Language- name 2 objects - 0 0 0 0  Language- repeat 1 1 1 1 1   Language- follow 3 step command - 0 3 3 3   Language- read & follow direction - 0 0 0 0  Write a sentence - 0 0 0 0  Copy design - 0 0 0 0  Total score - 17 20 20 20   Mini Cog  Mini-Cog screen was completed. Maximum score is 22. A value of 0 denotes this part of the MMSE was not completed or the patient failed this part of the Mini-Cog screening.       Immunizations Immunization History  Administered Date(s) Administered  . Fluad Quad(high Dose 65+) 01/22/2019  . Influenza Split 03/27/2011, 03/25/2012  . Influenza Whole 03/07/2007, 02/17/2008, 03/08/2009, 03/20/2010  .  Influenza,inj,Quad PF,6+ Mos 03/06/2013, 02/23/2014, 02/24/2016  . Influenza-Unspecified 02/27/2015, 01/19/2017, 12/26/2017  . Pneumococcal Conjugate-13 01/15/2014  . Pneumococcal Polysaccharide-23 03/08/2009  . Td 05/29/1995, 03/08/2009  . Zoster 08/12/2007    TDAP status: Due, Education has been provided regarding the importance of this vaccine. Advised may receive this vaccine at local pharmacy or Health Dept. Aware to provide a copy of the vaccination record if obtained from local pharmacy or Health Dept. Verbalized acceptance and understanding. Flu Vaccine status: Up to date Pneumococcal vaccine status: Up to date Covid-19 vaccine status: Declined, Education has been provided regarding the importance of this vaccine but patient still declined. Advised may receive this vaccine at local pharmacy or Health Dept.or vaccine clinic. Aware to provide a copy of the vaccination record if obtained from local pharmacy or Health Dept. Verbalized acceptance and understanding.  Qualifies for Shingles Vaccine? Yes   Zostavax completed Yes   Shingrix Completed?: No.    Education has been provided regarding the importance of this vaccine. Patient has been advised to call insurance company to determine out of pocket expense if they have not yet received this vaccine. Advised may also  receive vaccine at local pharmacy or Health Dept. Verbalized acceptance and understanding.  Screening Tests Health Maintenance  Topic Date Due  . COVID-19 Vaccine (1) 01/08/2020 (Originally 10/17/1945)  . MAMMOGRAM  05/27/2020 (Originally 02/15/2017)  . TETANUS/TDAP  12/23/2023 (Originally 03/09/2019)  . INFLUENZA VACCINE  12/27/2019  . DEXA SCAN  Completed  . PNA vac Low Risk Adult  Completed    Health Maintenance  There are no preventive care reminders to display for this patient.  Colorectal cancer screening: No longer required.  Mammogram status: No longer required.  Bone Density status: due, will have provider  place order  Lung Cancer Screening: (Low Dose CT Chest recommended if Age 36-80 years, 30 pack-year currently smoking OR have quit w/in 15 years.) does not qualify.    Additional Screening:  Hepatitis C Screening: does not qualify; Completed N/A  Vision Screening: Recommended annual ophthalmology exams for early detection of glaucoma and other disorders of the eye. Is the patient up to date with their annual eye exam?  Yes  Who is the provider or what is the name of the office in which the patient attends annual eye exams? [facility] If pt is not established with a provider, would they like to be referred to a provider to establish care? No .   Dental Screening: Recommended annual dental exams for proper oral hygiene  Community Resource Referral / Chronic Care Management: CRR required this visit?  No   CCM required this visit?  No      Plan:     I have personally reviewed and noted the following in the patient's chart:   . Medical and social history . Use of alcohol, tobacco or illicit drugs  . Current medications and supplements . Functional ability and status . Nutritional status . Physical activity . Advanced directives . List of other physicians . Hospitalizations, surgeries, and ER visits in previous 12 months . Vitals . Screenings to include cognitive, depression, and falls . Referrals and appointments  In addition, I have reviewed and discussed with patient certain preventive protocols, quality metrics, and best practice recommendations. A written personalized care plan for preventive services as well as general preventive health recommendations were provided to patient.   Due to this being a telephonic visit, the after visit summary with patients personalized plan was offered to patient via mail or my-chart.  Patient preferred to pick up at office at next visit.   Andrez Grime, LPN   [id number]

## 2019-12-24 ENCOUNTER — Encounter: Payer: Medicare Other | Admitting: Family Medicine

## 2019-12-25 ENCOUNTER — Encounter: Payer: Self-pay | Admitting: Family Medicine

## 2019-12-25 ENCOUNTER — Ambulatory Visit (INDEPENDENT_AMBULATORY_CARE_PROVIDER_SITE_OTHER): Payer: Medicare Other | Admitting: Family Medicine

## 2019-12-25 VITALS — BP 135/60 | HR 71 | Temp 97.1°F | Ht 65.0 in | Wt 216.1 lb

## 2019-12-25 DIAGNOSIS — I5032 Chronic diastolic (congestive) heart failure: Secondary | ICD-10-CM

## 2019-12-25 DIAGNOSIS — R5382 Chronic fatigue, unspecified: Secondary | ICD-10-CM

## 2019-12-25 DIAGNOSIS — R5383 Other fatigue: Secondary | ICD-10-CM | POA: Insufficient documentation

## 2019-12-25 DIAGNOSIS — R7303 Prediabetes: Secondary | ICD-10-CM | POA: Diagnosis not present

## 2019-12-25 DIAGNOSIS — B372 Candidiasis of skin and nail: Secondary | ICD-10-CM | POA: Diagnosis not present

## 2019-12-25 DIAGNOSIS — E559 Vitamin D deficiency, unspecified: Secondary | ICD-10-CM

## 2019-12-25 DIAGNOSIS — I10 Essential (primary) hypertension: Secondary | ICD-10-CM | POA: Diagnosis not present

## 2019-12-25 DIAGNOSIS — E7800 Pure hypercholesterolemia, unspecified: Secondary | ICD-10-CM

## 2019-12-25 MED ORDER — FLUCONAZOLE 150 MG PO TABS
150.0000 mg | ORAL_TABLET | Freq: Once | ORAL | 0 refills | Status: AC
Start: 2019-12-25 — End: 2019-12-25

## 2019-12-25 MED ORDER — TERCONAZOLE 0.4 % VA CREA: TOPICAL_CREAM | VAGINAL | 0 refills | Status: DC

## 2019-12-25 NOTE — Progress Notes (Signed)
Subjective:    Patient ID: [name], female    DOB: 11-Mar-1934, 86 y.o.   MRN: [id number]  This visit occurred during the SARS-CoV-2 public health emergency.  Safety protocols were in place, including screening questions prior to the visit, additional usage of staff PPE, and extensive cleaning of exam room while observing appropriate contact time as indicated for disinfecting solutions.    HPI Pt presents for annual f/u of chronic medical problems   Wt Readings from Last 3 Encounters:  12/25/19 (!) 216 lb 1 oz (98 kg)  03/04/19 215 lb 6.4 oz (97.7 kg)  12/01/18 211 lb 8 oz (95.9 kg)  stable  35.95 kg/m  Stays in a lot  Not a lot of energy to   She c/o rash  In the pubic area  Itch/burn She tried some proctasol -did not help this  desitin did not help either  Has not changed soap/cleansers  Does use baby wipes to clean    She had amw on 7/28 Declined covid vaccine  Noted dexa was due   Tetanus def for insurance non coverage  covid status -not immunized Does not trust immunization   Mammogram-thinking about getting  Self breast exam -no lumps or changes   dexa 8/15-normal bone density - may consider another one in the future but not now Falls - none/uses walker when not in the house  Fractures -none  Supplements -vit D  Vit D level is 39.7 Exercise -none /difficult to do   (has info re: chair exercise)  Needs a handicapped form  Very low stamina  Cannot stand  Makes her too tired  At times short of breath   HTN bp is stable today  No cp or palpitations or headaches or edema  No side effects to medicines  BP Readings from Last 3 Encounters:  12/25/19 (!) 144/78  03/04/19 124/68  12/01/18 125/70     H/o CHF  Takes spironolactone and hctz and K  Hyperlipidemia  Lab Results  Component Value Date   CHOL 177 12/17/2019   CHOL 194 11/24/2018   CHOL 185 11/11/2017   Lab Results  Component Value Date   HDL 50.90 12/17/2019   HDL 54.00 11/24/2018    HDL 54 11/11/2017   Lab Results  Component Value Date   LDLCALC 109 (H) 12/17/2019   LDLCALC 122 (H) 11/24/2018   LDLCALC 111 (H) 11/11/2017   Lab Results  Component Value Date   TRIG 88.0 12/17/2019   TRIG 90.0 11/24/2018   TRIG 101 11/11/2017   Lab Results  Component Value Date   CHOLHDL 3 12/17/2019   CHOLHDL 4 11/24/2018   CHOLHDL 3.4 11/11/2017   Lab Results  Component Value Date   LDLDIRECT 162.4 03/18/2012   LDLDIRECT 147.6 03/16/2010   Is statin intolerant  Sees cardiology  LDL is down to 109  Eating more fish   Lab Results  Component Value Date   WBC 5.2 12/17/2019   HGB 15.0 12/17/2019   HCT 42.9 12/17/2019   MCV 88.2 12/17/2019   PLT 134.0 (L) 12/17/2019    Lab Results  Component Value Date   HGBA1C 6.0 12/17/2019   Prediabetes  Improved from 6.2 Eats a lot of vegetables   Lab Results  Component Value Date   WBC 5.2 12/17/2019   HGB 15.0 12/17/2019   HCT 42.9 12/17/2019   MCV 88.2 12/17/2019   PLT 134.0 (L) 12/17/2019  platelet is always a little lower than reference range  Would like her B12 level checked for fatigue  Lab Results  Component Value Date   CREATININE 0.80 12/17/2019   BUN 18 12/17/2019   NA 138 12/17/2019   K 4.1 12/17/2019   CL 99 12/17/2019   CO2 33 (H) 12/17/2019   Patient Active Problem List   Diagnosis Date Noted  . Yeast infection of the skin 12/27/2019  . Fatigue 12/25/2019  . Chronic diastolic heart failure ([place]) 12/01/2018  . Dizziness 11/09/2017  . Episodic lightheadedness 03/22/2017  . Urinary frequency 10/15/2016  . Urge incontinence 10/15/2016  . Nonallopathic lesion of lumbar region 03/28/2015  . Nonallopathic lesion of thoracic region 03/28/2015  . Encounter for Medicare annual wellness exam 01/15/2014  . Colon cancer screening 01/15/2014  . Risk for falls 03/25/2012  . Dyspepsia 07/03/2011  . Vitamin D deficiency 08/29/2009  . DEPENDENT EDEMA, LEGS 09/07/2008  . CONSTIPATION 07/30/2008  .  Prediabetes 07/09/2008  . History of endometrial cancer 10/28/2007  . HEARING LOSS, LEFT EAR 10/15/2007  . HEMORRHOIDS, INTERNAL 10/15/2007  . EXTERNAL HEMORRHOIDS 10/15/2007  . ESOPHAGEAL SPASM 10/15/2007  . DIVERTICULOSIS, COLON 10/15/2007  . Hyperlipidemia 10/07/2007  . Essential hypertension 10/07/2007  . CAROTID BRUITS, BILATERAL 11/25/2005  . AORTIC STENOSIS 10/26/2004  . HIATAL HERNIA 12/27/1995   Past Medical History:  Diagnosis Date  . Alcohol abuse, unspecified   . Aortic stenosis    s/p tissue AVR 05/2011  . Arthritis   . Breast CA ([place])    rt  . Diaphragmatic hernia without mention of obstruction or gangrene   . Diverticulosis of colon (without mention of hemorrhage)   . Dyskinesia of esophagus   . Endometrial cancer ([place])   . Family history of colonic polyps   . Heart murmur   . HLD (hyperlipidemia)   . HTN (hypertension)   . Internal hemorrhoids without mention of complication   . Malignant neoplasm of corpus uteri, except isthmus ([place])   . Other abnormal glucose   . Pain in joint, shoulder region   . Postmenopausal bleeding   . Tobacco use disorder   . Unspecified hearing loss   . Vertigo    Past Surgical History:  Procedure Laterality Date  . AORTIC VALVE REPLACEMENT  06/13/2011   Procedure: AORTIC VALVE REPLACEMENT (AVR);  Surgeon: Gaye Pollack, MD;  Location: [facility];  Service: Open Heart Surgery;  Laterality: N/A;  . 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  . BREAST LUMPECTOMY  11/2009   DCIS, neg sentinel lymph node biopsy  . CATARACT EXTRACTION     bil  . LAPAROSCOPY W/ TOTAL BILATERAL PELVIC AND PERI-AORTIC LYMPHADENECTOMY  11/04/2007  . PARAAORTIC LYMPHADENECTOMY    . ROTATOR CUFF REPAIR  05/2008   right  . TOTAL ABDOMINAL HYSTERECTOMY W/ BILATERAL SALPINGOOPHORECTOMY  11/04/07   salpingo-oophrectomy   Social History   Tobacco Use  . Smoking status: Former Smoker    Packs/day: 1.00    Quit date: 03/28/2000    Years since quitting: 19.7  . Smokeless  tobacco: Never Used  Vaping Use  . Vaping Use: Never used  Substance Use Topics  . Alcohol use: Not Currently    Alcohol/week: 0.0 standard drinks  . Drug use: Never   Family History  Problem Relation Age of Onset  . Colon polyps Sister        2010  . Heart disease Mother   . Heart attack Mother   . Stroke Father   . Heart failure Father   . Diabetes type  II Father   . Diabetes type II Brother   . Cancer Maternal Aunt        possible post menopausal breast cancer   Allergies  Allergen Reactions  . Aspirin Other (See Comments)    dizzy  . Calcium     REACTION: constipation  . Diovan [Valsartan]     REACTION: abdominal pain  . Ezetimibe     REACTION: fatigue  . Ramipril     REACTION: abdominal pain  . Simvastatin     REACTION: leg pain and body aches   Current Outpatient Medications on File Prior to Visit  Medication Sig Dispense Refill  . amoxicillin (AMOXIL) 500 MG capsule Take 2,000 mg by mouth daily. Take prior to dental    . carvedilol (COREG) 25 MG tablet Take 1 tablet (25 mg total) by mouth 2 (two) times daily with a meal. 180 tablet 3  . Cholecalciferol (VITAMIN D) 1000 UNITS capsule Take 1 capsule (1,000 Units total) by mouth 2 (two) times daily. 180 capsule 3  . hydrochlorothiazide (HYDRODIURIL) 25 MG tablet TAKE 1 TABLET BY MOUTH EVERY DAY 90 tablet 2  . hydrocortisone (ANUSOL-HC) 2.5 % rectal cream Place 1 application rectally 2 (two) times daily. 30 g 3  . KLOR-CON M20 20 MEQ tablet TAKE 1 TABLET BY MOUTH EVERY DAY 90 tablet 3  . Omega-3 Fatty Acids (FISH OIL) 1000 MG CAPS Take 2,000 mg by mouth daily.     . Psyllium (METAMUCIL PO) Take 1 Dose by mouth as directed.     Marland Kitchen spironolactone (ALDACTONE) 25 MG tablet TAKE 1 TABLET (25 MG TOTAL) BY MOUTH DAILY. 90 tablet 3   No current facility-administered medications on file prior to visit.     Review of Systems  Constitutional: Positive for fatigue. Negative for activity change, appetite change, fever and  unexpected weight change.  HENT: Negative for congestion, ear pain, rhinorrhea, sinus pressure and sore throat.   Eyes: Negative for pain, redness and visual disturbance.  Respiratory: Negative for cough, shortness of breath and wheezing.   Cardiovascular: Negative for chest pain and palpitations.  Gastrointestinal: Negative for abdominal pain, blood in stool, constipation and diarrhea.  Endocrine: Negative for polydipsia and polyuria.  Genitourinary: Negative for dysuria, frequency and urgency.       Pain of external vulva from skin problem  Musculoskeletal: Negative for arthralgias, back pain and myalgias.  Skin: Positive for rash. Negative for pallor and wound.  Allergic/Immunologic: Negative for environmental allergies.  Neurological: Negative for dizziness, syncope and headaches.  Hematological: Negative for adenopathy. Does not bruise/bleed easily.  Psychiatric/Behavioral: Negative for decreased concentration and dysphoric mood. The patient is not nervous/anxious.        Objective:   Physical Exam Constitutional:      General: She is not in acute distress.    Appearance: Normal appearance. She is well-developed. She is obese. She is not ill-appearing or diaphoretic.  HENT:     Head: Normocephalic and atraumatic.     Right Ear: Tympanic membrane, ear canal and external ear normal.     Left Ear: Tympanic membrane, ear canal and external ear normal.     Nose: Nose normal. No congestion.     Mouth/Throat:     Mouth: Mucous membranes are moist.     Pharynx: Oropharynx is clear. No posterior oropharyngeal erythema.  Eyes:     General: No scleral icterus.    Extraocular Movements: Extraocular movements intact.     Conjunctiva/sclera: Conjunctivae normal.  Pupils: Pupils are equal, round, and reactive to light.  Neck:     Thyroid: No thyromegaly.     Vascular: No carotid bruit or JVD.  Cardiovascular:     Rate and Rhythm: Normal rate and regular rhythm.     Pulses: Normal  pulses.     Heart sounds: Normal heart sounds. No gallop.   Pulmonary:     Effort: Pulmonary effort is normal. No respiratory distress.     Breath sounds: Normal breath sounds. No wheezing.     Comments: Good air exch Chest:     Chest wall: No tenderness.  Abdominal:     General: Bowel sounds are normal. There is no distension or abdominal bruit.     Palpations: Abdomen is soft. There is no mass.     Tenderness: There is no abdominal tenderness.     Hernia: No hernia is present.  Genitourinary:    Comments: Breast exam: No mass, nodules, thickening, tenderness, bulging, retraction, inflamation, nipple discharge or skin changes noted.  No axillary or clavicular LA.      Constant /dribbling urinary incontinence See skin exam/ext gyn Musculoskeletal:        General: No tenderness. Normal range of motion.     Cervical back: Normal range of motion and neck supple. No rigidity. No muscular tenderness.     Right lower leg: Edema present.     Left lower leg: Edema present.     Comments: Trace pedal edema  Mild kyphosis   Lymphadenopathy:     Cervical: No cervical adenopathy.  Skin:    General: Skin is warm and dry.     Coloration: Skin is not pale.     Findings: No erythema or rash.     Comments: sks and some tags   Skin of vulva and perineum is erythematous with some satellite lesions  Scant breakdown anteriorly  Neurological:     Mental Status: She is alert. Mental status is at baseline.     Cranial Nerves: No cranial nerve deficit.     Motor: No abnormal muscle tone.     Coordination: Coordination normal.     Gait: Gait normal.     Deep Tendon Reflexes: Reflexes are normal and symmetric. Reflexes normal.     Comments: Poor balance  Needs help getting on /off table Uses walker well  Psychiatric:        Mood and Affect: Mood normal.        Cognition and Memory: Cognition and memory normal.           Assessment & Plan:   Problem List Items Addressed This Visit       Cardiovascular and Mediastinum   Essential hypertension - Primary    bp in fair control at this time  BP Readings from Last 1 Encounters:  12/25/19 (!) 135/60   No changes needed Most recent labs reviewed  Disc lifstyle change with low sodium diet and exercise        Chronic diastolic heart failure ([place])    Continues f/u with cardiology  More fatigued lately  Watching status of valve        Musculoskeletal and Integument   Yeast infection of the skin    Suspect this is cause of perineal discomfort  Need to watch for more skin breakdown Also incontinent of urine so area stays wet  Will tx with diflucan and terazol  Update if no improvement  Enc to keep area as dry as possible  Other   Vitamin D deficiency    Vitamin D level is therapeutic with current supplementation Disc importance of this to bone and overall health Level 39.7       Hyperlipidemia    Disc goals for lipids and reasons to control them Rev last labs with pt Rev low sat fat diet in detail  Unfortunately statin intolerant  LDL is down to 109  Also eating more fish/eating better Cardiology aware      Prediabetes    Lab Results  Component Value Date   HGBA1C 6.0 12/17/2019   Improved from 6.2 disc imp of low glycemic diet and wt loss to prevent DM2       Fatigue    Labs reviewed  Will check in with cardiology  Encouraged activity as tolerated

## 2019-12-25 NOTE — Patient Instructions (Addendum)
Please think about the covid vaccine -I think it is very important   Do get your mammogram- [facility]   Chair exercise is great if you want to try it - start slow and it may give you more energy    If you need benadryl for sleep - try 1/2 of the 25 mg tablet It can increase fall risk in folks over the age of 70   Take care of yourself  Labs look stable  I think you have a skin yeast infection in the pelvic area  Try to keep dry- dry well after bathing and keep cool  Take the diflucan when you get it and try the cream  If no improve in 2 weeks please let us know

## 2019-12-27 DIAGNOSIS — B372 Candidiasis of skin and nail: Secondary | ICD-10-CM | POA: Insufficient documentation

## 2019-12-27 NOTE — Assessment & Plan Note (Signed)
Labs reviewed  Will check in with cardiology  Encouraged activity as tolerated

## 2019-12-27 NOTE — Assessment & Plan Note (Signed)
Lab Results  Component Value Date   HGBA1C 6.0 12/17/2019   Improved from 6.2 disc imp of low glycemic diet and wt loss to prevent DM2

## 2019-12-27 NOTE — Assessment & Plan Note (Signed)
Vitamin D level is therapeutic with current supplementation Disc importance of this to bone and overall health Level 39.7

## 2019-12-27 NOTE — Assessment & Plan Note (Signed)
bp in fair control at this time  BP Readings from Last 1 Encounters:  12/25/19 (!) 135/60   No changes needed Most recent labs reviewed  Disc lifstyle change with low sodium diet and exercise

## 2019-12-27 NOTE — Assessment & Plan Note (Signed)
Disc goals for lipids and reasons to control them Rev last labs with pt Rev low sat fat diet in detail  Unfortunately statin intolerant  LDL is down to 109  Also eating more fish/eating better Cardiology aware

## 2019-12-27 NOTE — Assessment & Plan Note (Signed)
Suspect this is cause of perineal discomfort  Need to watch for more skin breakdown Also incontinent of urine so area stays wet  Will tx with diflucan and terazol  Update if no improvement  Enc to keep area as dry as possible

## 2019-12-27 NOTE — Assessment & Plan Note (Signed)
Continues f/u with cardiology  More fatigued lately  Watching status of valve

## 2020-02-15 ENCOUNTER — Other Ambulatory Visit: Payer: Self-pay | Admitting: Family Medicine

## 2020-02-24 DIAGNOSIS — Z23 Encounter for immunization: Secondary | ICD-10-CM | POA: Diagnosis not present

## 2020-03-21 ENCOUNTER — Other Ambulatory Visit: Payer: Self-pay | Admitting: Cardiovascular Disease

## 2020-05-13 ENCOUNTER — Other Ambulatory Visit: Payer: Self-pay | Admitting: Cardiovascular Disease

## 2020-05-30 ENCOUNTER — Ambulatory Visit (HOSPITAL_COMMUNITY): Payer: Medicare Other | Attending: Cardiology

## 2020-05-30 ENCOUNTER — Ambulatory Visit (INDEPENDENT_AMBULATORY_CARE_PROVIDER_SITE_OTHER): Payer: Medicare Other | Admitting: Cardiovascular Disease

## 2020-05-30 ENCOUNTER — Other Ambulatory Visit: Payer: Self-pay

## 2020-05-30 ENCOUNTER — Encounter: Payer: Self-pay | Admitting: Cardiovascular Disease

## 2020-05-30 VITALS — BP 130/50 | HR 82 | Ht 65.5 in | Wt 219.6 lb

## 2020-05-30 DIAGNOSIS — I5032 Chronic diastolic (congestive) heart failure: Secondary | ICD-10-CM

## 2020-05-30 DIAGNOSIS — I10 Essential (primary) hypertension: Secondary | ICD-10-CM

## 2020-05-30 DIAGNOSIS — I359 Nonrheumatic aortic valve disorder, unspecified: Secondary | ICD-10-CM

## 2020-05-30 LAB — ECHOCARDIOGRAM COMPLETE
AR max vel: 1.28 cm2
AV Area VTI: 1.23 cm2
AV Area mean vel: 1.33 cm2
AV Mean grad: 15 mmHg
AV Peak grad: 23.4 mmHg
Ao pk vel: 2.42 m/s
Area-P 1/2: 4.44 cm2
Height: 65.5 in
S' Lateral: 3.1 cm
Weight: 3513.6 oz

## 2020-05-30 MED ORDER — FUROSEMIDE 20 MG PO TABS: 20.0000 mg | ORAL_TABLET | Freq: Every day | ORAL | 3 refills | Status: DC

## 2020-05-30 NOTE — Patient Instructions (Addendum)
Medication Instructions:  1) STOP HYDROCHLOROTHIAZIDE 2) START FUROSEMIDE (Lasix) 20 mg daily *If you need a refill on your cardiac medications before your next appointment, please call your pharmacy*  Testing/Procedures: Your provider has requested that you have an echocardiogram. Echocardiography is a painless test that uses sound waves to create images of your heart. It provides your doctor with information about the size and shape of your heart and how well your heart's chambers and valves are working. This procedure takes approximately one hour. There are no restrictions for this procedure.  Follow-Up: At [facility], you and your health needs are our priority.  As part of our continuing mission to provide you with exceptional heart care, we have created designated Provider Care Teams.  These Care Teams include your primary Cardiologist (physician) and Advanced Practice Providers (APPs -  Physician Assistants and Nurse Practitioners) who all work together to provide you with the care you need, when you need it. Your next appointment:   12 month(s) The format for your next appointment:   In Person Provider:   You may see [name], MD or one of the following Advanced Practice Providers on your designated Care Team:    Tereso Newcomer, PA-C  [place], [place]

## 2020-05-30 NOTE — Progress Notes (Signed)
Cardiology Office Note:    Date:  05/30/2020   ID:  [name], DOB 16-Apr-1934, MRN [id number]  PCP:  [name] Pimple, MD  [facility] Cardiologist:  [name] Bollman, MD  [facility] Electrophysiologist:  None   Referring MD: [name] Pimple, MD   Chief Complaint  Patient presents with  . Fatigue    History of Present Illness:    [name] is a 87 y.o. female with a hx of:  Aortic stenosis ? Status post bioprosthetic AVR 05/2011 ? Pt DC'd ASA 2/2 to dizziness   Chronic diastolic CHF  Breast cancer  Endometrial cancer  Hypertension  Hyperlipidemia  Leg edema  The patient is here alone today. She complains of progressive fatigue. She has to stop and rest with minimal activity such as taking a shower. The patient has chronic swelling and wears compression stockings. She has chronic low back pain and significant limitation from this. She is still able to drive an automobile. She reports very poor balance and occasionally uses a walker, but is able to go grocery shopping and uses a cart to help stabilize her gait. Ordinarily uses a cane to walk. She has mild shortness of breath but her primary issue is weakness and fatigue. No chest pain. She has slept in a recliner for years but denies symptoms of orthopnea.   Past Medical History:  Diagnosis Date  . Alcohol abuse, unspecified   . Aortic stenosis    s/p tissue AVR 05/2011  . Arthritis   . Breast CA (HCC)    rt  . Diaphragmatic hernia without mention of obstruction or gangrene   . Diverticulosis of colon (without mention of hemorrhage)   . Dyskinesia of esophagus   . Endometrial cancer (HCC)   . Family history of colonic polyps   . Heart murmur   . HLD (hyperlipidemia)   . HTN (hypertension)   . Internal hemorrhoids without mention of complication   . Malignant neoplasm of corpus uteri, except isthmus (HCC)   . Other abnormal glucose   . Pain in joint, shoulder region   . Postmenopausal bleeding   . Tobacco  use disorder   . Unspecified hearing loss   . Vertigo     Past Surgical History:  Procedure Laterality Date  . AORTIC VALVE REPLACEMENT  06/13/2011   Procedure: AORTIC VALVE REPLACEMENT (AVR);  Surgeon: [name], MD;  Location: [facility] OR;  Service: Open Heart Surgery;  Laterality: N/A;  . 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  . BREAST LUMPECTOMY  11/2009   DCIS, neg sentinel lymph node biopsy  . CATARACT EXTRACTION     bil  . LAPAROSCOPY W/ TOTAL BILATERAL PELVIC AND PERI-AORTIC LYMPHADENECTOMY  11/04/2007  . PARAAORTIC LYMPHADENECTOMY    . ROTATOR CUFF REPAIR  05/2008   right  . TOTAL ABDOMINAL HYSTERECTOMY W/ BILATERAL SALPINGOOPHORECTOMY  11/04/07   salpingo-oophrectomy    Current Medications: Current Meds  Medication Sig  . amoxicillin (AMOXIL) 500 MG capsule Take 2,000 mg by mouth daily. Take prior to dental  . carvedilol (COREG) 25 MG tablet TAKE 1 TABLET (25 MG TOTAL) BY MOUTH 2 (TWO) TIMES DAILY WITH A MEAL.  Marland Kitchen Cholecalciferol (VITAMIN D) 1000 UNITS capsule Take 1 capsule (1,000 Units total) by mouth 2 (two) times daily.  . furosemide (LASIX) 20 MG tablet Take 1 tablet (20 mg total) by mouth daily.  . Omega-3 Fatty Acids (FISH OIL) 1000 MG CAPS Take 2,000 mg by mouth daily.  Marland Kitchen  potassium chloride SA (KLOR-CON M20) 20 MEQ tablet Take 1 tablet (20 mEq total) by mouth daily. Please keep upcoming appointment for future refills.thank you  . Psyllium (METAMUCIL PO) Take 1 Dose by mouth as directed.   Marland Kitchen spironolactone (ALDACTONE) 25 MG tablet TAKE 1 TABLET (25 MG TOTAL) BY MOUTH DAILY.  . [DISCONTINUED] hydrochlorothiazide (HYDRODIURIL) 25 MG tablet TAKE 1 TABLET BY MOUTH EVERY DAY     Allergies:   Aspirin, Calcium, Diovan [valsartan], Ezetimibe, Ramipril, and Simvastatin   Social History   Socioeconomic History  . Marital status: Widowed    Spouse name: Not on file  . Number of children: 2  . Years of education: Not on file  . Highest education level: High school graduate   Occupational History  . Not on file  Tobacco Use  . Smoking status: Former Smoker    Packs/day: 1.00    Quit date: 03/28/2000    Years since quitting: 20.1  . Smokeless tobacco: Never Used  Vaping Use  . Vaping Use: Never used  Substance and Sexual Activity  . Alcohol use: Not Currently    Alcohol/week: 0.0 standard drinks  . Drug use: Never  . Sexual activity: Not Currently  Other Topics Concern  . Not on file  Social History Narrative   Lives at home alone   Right handed   Caffeine: 1-2 cups daily   Social Determinants of Health   Financial Resource Strain: Low Risk   . Difficulty of Paying Living Expenses: Not hard at all  Food Insecurity: No Food Insecurity  . Worried About Charity fundraiser in the Last Year: Never true  . Ran Out of Food in the Last Year: Never true  Transportation Needs: No Transportation Needs  . Lack of Transportation (Medical): No  . Lack of Transportation (Non-Medical): No  Physical Activity: Inactive  . Days of Exercise per Week: 0 days  . Minutes of Exercise per Session: 0 min  Stress: No Stress Concern Present  . Feeling of Stress : Not at all  Social Connections: Not on file     Family History: The patient's family history includes Cancer in her maternal aunt; Colon polyps in her sister; Diabetes type II in her brother and father; Heart attack in her mother; Heart disease in her mother; Heart failure in her father; Stroke in her father.  ROS:   Please see the history of present illness.    All other systems reviewed and are negative.  EKGs/Labs/Other Studies Reviewed:    The following studies were reviewed today: Echo 03-10-2018: Study Conclusions   - Left ventricle: The cavity size was normal. There was moderate  concentric hypertrophy. Systolic function was normal. The  estimated ejection fraction was in the range of 60% to 65%. Wall  motion was normal; there were no regional wall motion  abnormalities. Doppler  parameters are consistent with abnormal  left ventricular relaxation (grade 1 diastolic dysfunction).  Doppler parameters are consistent with elevated ventricular  end-diastolic filling pressure.  - Aortic valve: S/P bioprosthetic AVR, normal transaortic gradients  with peak/mean 21/12 mmHg (unchanged from prior), no paravalvular  leak.  - Mitral valve: There was mild regurgitation.  - Left atrium: The atrium was mildly dilated.  - Tricuspid valve: There was no regurgitation.  - Inferior vena cava: The vessel was normal in size. The  respirophasic diameter changes were in the normal range (= 50%),  consistent with normal central venous pressure.   Impressions:   - There has  been no significant change since the prior study on  03/12/2017.   EKG:  EKG is ordered today.  The ekg ordered today demonstrates NSR 82 bpm, LVH, LAFB, no change from prior tracing dated 03/04/2019  Recent Labs: 12/17/2019: ALT 33; BUN 18; Creatinine, Ser 0.80; Hemoglobin 15.0; Platelets 134.0; Potassium 4.1; Sodium 138; TSH 2.18  Recent Lipid Panel    Component Value Date/Time   CHOL 177 12/17/2019 0845   CHOL 185 11/11/2017 1114   TRIG 88.0 12/17/2019 0845   TRIG 58 05/13/2006 0933   HDL 50.90 12/17/2019 0845   HDL 54 11/11/2017 1114   CHOLHDL 3 12/17/2019 0845   VLDL 17.6 12/17/2019 0845   LDLCALC 109 (H) 12/17/2019 0845   LDLCALC 111 (H) 11/11/2017 1114   LDLDIRECT 162.4 03/18/2012 0900    Risk Assessment/Calculations:      Physical Exam:    VS:  BP (!) 130/50   Pulse 82   Ht 5' 5.5" (1.664 m)   Wt 219 lb 9.6 oz (99.6 kg)   LMP 05/29/2007   SpO2 95%   BMI 35.99 kg/m     Wt Readings from Last 3 Encounters:  05/30/20 219 lb 9.6 oz (99.6 kg)  12/25/19 (!) 216 lb 1 oz (98 kg)  03/04/19 215 lb 6.4 oz (97.7 kg)     GEN: Well nourished, well developed in no acute distress HEENT: Normal NECK: No JVD; No carotid bruits LYMPHATICS: No lymphadenopathy CARDIAC: RRR, 2/6  systolic murmur at the RUSB, mid-peaking RESPIRATORY:  Clear to auscultation without rales, wheezing or rhonchi  ABDOMEN: Soft, non-tender, non-distended MUSCULOSKELETAL:  2+ bilateral lower extremity edema; No deformity  SKIN: Warm and dry NEUROLOGIC:  Alert and oriented x 3 PSYCHIATRIC:  Normal affect   ASSESSMENT:    1. Aortic valve disorder   2. Chronic diastolic heart failure ([place])   3. Essential hypertension    PLAN:    In order of problems listed above:  1. Exam essentially stable.  Patient now out 9 years from aortic valve replacement.  Will update 2D echocardiogram in light of ongoing symptoms of fatigue and exercise intolerance. 2. I think the primary reason for the patient's lower extremity edema is chronic venous insufficiency.  However, she does have significant edema today.  She has responded better to furosemide in the past but was not taking this because she was active and it was an inconvenience for her.  She would like to try this again.  We will start her on furosemide 20 mg daily.  Continue spironolactone 25 mg daily.  She will stop taking hydrochlorothiazide.  We discussed leg edema, compression stockings, and further therapy for chronic venous stasis.  She sees her dermatologist regularly. 3. Blood pressure controlled on current therapy.  Continue carvedilol.  Continue spironolactone.  Add furosemide and stop hydrochlorothiazide as outlined above.  Most recent labs reviewed.  Medication Adjustments/Labs and Tests Ordered: Current medicines are reviewed at length with the patient today.  Concerns regarding medicines are outlined above.  Orders Placed This Encounter  Procedures  . EKG 12-Lead  . ECHOCARDIOGRAM COMPLETE   Meds ordered this encounter  Medications  . furosemide (LASIX) 20 MG tablet    Sig: Take 1 tablet (20 mg total) by mouth daily.    Dispense:  90 tablet    Refill:  3    Patient Instructions  Medication Instructions:  1) STOP  HYDROCHLOROTHIAZIDE 2) START FUROSEMIDE (Lasix) 20 mg daily *If you need a refill on your cardiac medications before  your next appointment, please call your pharmacy*  Testing/Procedures: Your provider has requested that you have an echocardiogram. Echocardiography is a painless test that uses sound waves to create images of your heart. It provides your doctor with information about the size and shape of your heart and how well your heart's chambers and valves are working. This procedure takes approximately one hour. There are no restrictions for this procedure.  Follow-Up: At [facility], you and your health needs are our priority.  As part of our continuing mission to provide you with exceptional heart care, we have created designated Provider Care Teams.  These Care Teams include your primary Cardiologist (physician) and Advanced Practice Providers (APPs -  Physician Assistants and Nurse Practitioners) who all work together to provide you with the care you need, when you need it. Your next appointment:   12 month(s) The format for your next appointment:   In Person Provider:   You may see Sherren Mocha, MD or one of the following Advanced Practice Providers on your designated Care Team:    Richardson Dopp, PA-C  Robbie Lis, [place]     Signed, Sherren Mocha, MD  05/30/2020 2:42 PM    [place]

## 2020-06-12 ENCOUNTER — Other Ambulatory Visit: Payer: Self-pay | Admitting: Cardiovascular Disease

## 2020-07-06 ENCOUNTER — Other Ambulatory Visit: Payer: Self-pay | Admitting: Cardiovascular Disease

## 2020-08-10 DIAGNOSIS — H353130 Nonexudative age-related macular degeneration, bilateral, stage unspecified: Secondary | ICD-10-CM | POA: Diagnosis not present

## 2020-08-10 DIAGNOSIS — H26493 Other secondary cataract, bilateral: Secondary | ICD-10-CM | POA: Diagnosis not present

## 2020-08-10 DIAGNOSIS — Z961 Presence of intraocular lens: Secondary | ICD-10-CM | POA: Diagnosis not present

## 2020-08-23 ENCOUNTER — Telehealth: Payer: Self-pay | Admitting: Cardiovascular Disease

## 2020-08-23 DIAGNOSIS — I8312 Varicose veins of left lower extremity with inflammation: Secondary | ICD-10-CM | POA: Diagnosis not present

## 2020-08-23 DIAGNOSIS — L821 Other seborrheic keratosis: Secondary | ICD-10-CM | POA: Diagnosis not present

## 2020-08-23 DIAGNOSIS — I872 Venous insufficiency (chronic) (peripheral): Secondary | ICD-10-CM | POA: Diagnosis not present

## 2020-08-23 DIAGNOSIS — L720 Epidermal cyst: Secondary | ICD-10-CM | POA: Diagnosis not present

## 2020-08-23 DIAGNOSIS — I8311 Varicose veins of right lower extremity with inflammation: Secondary | ICD-10-CM | POA: Diagnosis not present

## 2020-08-23 DIAGNOSIS — L738 Other specified follicular disorders: Secondary | ICD-10-CM | POA: Diagnosis not present

## 2020-08-23 DIAGNOSIS — L218 Other seborrheic dermatitis: Secondary | ICD-10-CM | POA: Diagnosis not present

## 2020-08-23 MED ORDER — POTASSIUM CHLORIDE CRYS ER 20 MEQ PO TBCR: 20.0000 meq | EXTENDED_RELEASE_TABLET | Freq: Every day | ORAL | 3 refills | Status: DC

## 2020-08-23 NOTE — Telephone Encounter (Signed)
Pt's medication was sent to pt's pharmacy as requested. Confirmation received.  °

## 2020-08-23 NOTE — Telephone Encounter (Signed)
*  STAT* If patient is at the pharmacy, call can be transferred to refill team.   1. Which medications need to be refilled? (please list name of each medication and dose if known) potassium   2. Which pharmacy/location (including street and city if local pharmacy) is medication to be sent to? [facility] [address]  3. Do they need a 30 day or 90 day supply? Patient had a 30 day refill and she usually get 81 patient saw Dr. Burt Knack in January.

## 2020-08-31 DIAGNOSIS — H26493 Other secondary cataract, bilateral: Secondary | ICD-10-CM | POA: Diagnosis not present

## 2020-09-14 DIAGNOSIS — H26493 Other secondary cataract, bilateral: Secondary | ICD-10-CM | POA: Diagnosis not present

## 2020-12-14 DIAGNOSIS — H04123 Dry eye syndrome of bilateral lacrimal glands: Secondary | ICD-10-CM | POA: Diagnosis not present

## 2020-12-14 DIAGNOSIS — H3589 Other specified retinal disorders: Secondary | ICD-10-CM | POA: Diagnosis not present

## 2021-02-03 DIAGNOSIS — Z23 Encounter for immunization: Secondary | ICD-10-CM | POA: Diagnosis not present

## 2021-02-06 ENCOUNTER — Other Ambulatory Visit: Payer: Self-pay | Admitting: Family Medicine

## 2021-02-07 NOTE — Telephone Encounter (Signed)
Please schedule patient for AWV or Follow up visit with Dr Glori Bickers. Overdue. Thank you  LOV was 12/25/19

## 2021-02-07 NOTE — Telephone Encounter (Signed)
Please schedule f/u and refill until then  

## 2021-02-08 NOTE — Telephone Encounter (Signed)
Med refilled once and will route to support pool to schedule appt

## 2021-05-08 ENCOUNTER — Other Ambulatory Visit: Payer: Self-pay | Admitting: Family Medicine

## 2021-05-09 NOTE — Telephone Encounter (Signed)
Patient is overdue for AWV/CPE. Please schedule patient with medicare wellness nurse and then schedule with Dr Glori Bickers for CPE for first available. Then can send back for refill. Thank you

## 2021-05-09 NOTE — Telephone Encounter (Signed)
Called Mrs. Samia and got her scheduled for 12/21 for labs, [place] 12/21 and CPE on 12/29 @1130 

## 2021-05-16 ENCOUNTER — Telehealth: Payer: Self-pay | Admitting: Family Medicine

## 2021-05-16 DIAGNOSIS — E7800 Pure hypercholesterolemia, unspecified: Secondary | ICD-10-CM

## 2021-05-16 DIAGNOSIS — E559 Vitamin D deficiency, unspecified: Secondary | ICD-10-CM

## 2021-05-16 DIAGNOSIS — R7303 Prediabetes: Secondary | ICD-10-CM

## 2021-05-16 DIAGNOSIS — I10 Essential (primary) hypertension: Secondary | ICD-10-CM

## 2021-05-16 NOTE — Progress Notes (Signed)
Subjective:   [name] is a 87 y.o. female who presents for Medicare Annual (Subsequent) preventive examination.  I connected with Reizel Calzada today by telephone and verified that I am speaking with the correct person using two identifiers. Location patient: home Location provider: work Persons participating in the virtual visit: patient, Marine scientist.    I discussed the limitations, risks, security and privacy concerns of performing an evaluation and management service by telephone and the availability of in person appointments. I also discussed with the patient that there may be a patient responsible charge related to this service. The patient expressed understanding and verbally consented to this telephonic visit.    Interactive audio and video telecommunications were attempted between this provider and patient, however failed, due to patient having technical difficulties OR patient did not have access to video capability.  We continued and completed visit with audio only.  Some vital signs may be absent or patient reported.   Time Spent with patient on telephone encounter: 30 minutes  Review of Systems     Cardiac Risk Factors include: advanced age (>3men, >21 women);hypertension;dyslipidemia     Objective:    Today's Vitals   05/17/21 1444 05/17/21 1445  Weight: 217 lb (98.4 kg)   Height: 5\' 5"  (1.651 m)   PainSc:  4    Body mass index is 36.11 kg/m.  Advanced Directives 05/17/2021 12/23/2019 11/19/2018 12/20/2017 11/11/2017 03/06/2017 11/07/2016  Does Patient Have a Medical Advance Directive? Yes No Yes Yes Yes No Yes  Type of Advance Directive Healthcare Power of [place]  Does patient want to make changes to medical advance directive? Yes (MAU/Ambulatory/Procedural Areas - Information given) - - - - - -  Copy of Healthcare Power of Attorney in Chart? - - - - No - copy requested - No  - copy requested  Would patient like information on creating a medical advance directive? - No - Patient declined - - - - -  Pre-existing out of facility DNR order (yellow form or pink MOST form) - - - - - - -    Current Medications (verified) Outpatient Encounter Medications as of 05/17/2021  Medication Sig   amoxicillin (AMOXIL) 500 MG capsule Take 2,000 mg by mouth daily. Take prior to dental   carvedilol (COREG) 25 MG tablet TAKE 1 TABLET (25 MG TOTAL) BY MOUTH 2 (TWO) TIMES DAILY WITH A MEAL.   Cholecalciferol (VITAMIN D) 1000 UNITS capsule Take 1 capsule (1,000 Units total) by mouth 2 (two) times daily.   diphenhydrAMINE (BENADRYL ALLERGY) 25 mg capsule Take 25 mg by mouth every 6 (six) hours as needed. Patient taking to help sleep   furosemide (LASIX) 20 MG tablet Take 1 tablet (20 mg total) by mouth daily.   Omega-3 Fatty Acids (FISH OIL) 1000 MG CAPS Take 2,000 mg by mouth daily.   potassium chloride SA (KLOR-CON M20) 20 MEQ tablet Take 1 tablet (20 mEq total) by mouth daily.   Psyllium (METAMUCIL PO) Take 1 Dose by mouth as directed.    spironolactone (ALDACTONE) 25 MG tablet TAKE 1 TABLET (25 MG TOTAL) BY MOUTH DAILY.   No facility-administered encounter medications on file as of 05/17/2021.    Allergies (verified) Aspirin, Calcium, Diovan [valsartan], Ezetimibe, Ramipril, and Simvastatin   History: Past Medical History:  Diagnosis Date   Alcohol abuse, unspecified    Aortic stenosis    s/p tissue AVR 05/2011  Arthritis    Breast CA ([place])    rt   Diaphragmatic hernia without mention of obstruction or gangrene    Diverticulosis of colon (without mention of hemorrhage)    Dyskinesia of esophagus    Endometrial cancer (HCC)    Family history of colonic polyps    Heart murmur    HLD (hyperlipidemia)    HTN (hypertension)    Internal hemorrhoids without mention of complication    Malignant neoplasm of corpus uteri, except isthmus ([place])    Other abnormal glucose     Pain in joint, shoulder region    Postmenopausal bleeding    Tobacco use disorder    Unspecified hearing loss    Vertigo    Past Surgical History:  Procedure Laterality Date   AORTIC VALVE REPLACEMENT  06/13/2011   Procedure: AORTIC VALVE REPLACEMENT (AVR);  Surgeon: Gaye Pollack, MD;  Location: [facility];  Service: Open Heart Surgery;  Laterality: N/A;   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   BREAST LUMPECTOMY  11/2009   DCIS, neg sentinel lymph node biopsy   CATARACT EXTRACTION     bil   LAPAROSCOPY W/ TOTAL BILATERAL PELVIC AND PERI-AORTIC LYMPHADENECTOMY  11/04/2007   PARAAORTIC LYMPHADENECTOMY     ROTATOR CUFF REPAIR  05/2008   right   TOTAL ABDOMINAL HYSTERECTOMY W/ BILATERAL SALPINGOOPHORECTOMY  11/04/07   salpingo-oophrectomy   Family History  Problem Relation Age of Onset   Colon polyps Sister        2010   Heart disease Mother    Heart attack Mother    Stroke Father    Heart failure Father    Diabetes type II Father    Diabetes type II Brother    Cancer Maternal Aunt        possible post menopausal breast cancer   Social History   Socioeconomic History   Marital status: Widowed    Spouse name: Not on file   Number of children: 2   Years of education: Not on file   Highest education level: High school graduate  Occupational History   Not on file  Tobacco Use   Smoking status: Former    Packs/day: 1.00    Types: Cigarettes    Quit date: 03/28/2000    Years since quitting: 21.1   Smokeless tobacco: Never  Vaping Use   Vaping Use: Never used  Substance and Sexual Activity   Alcohol use: Not Currently    Alcohol/week: 0.0 standard drinks   Drug use: Never   Sexual activity: Not Currently  Other Topics Concern   Not on file  Social History Narrative   Lives at home alone   Right handed   Caffeine: 1-2 cups daily   Social Determinants of Health   Financial Resource Strain: Low Risk    Difficulty of Paying Living Expenses: Not hard at all  Food Insecurity: No Food  Insecurity   Worried About Charity fundraiser in the Last Year: Never true   [place] in the Last Year: Never true  Transportation Needs: No Transportation Needs   Lack of Transportation (Medical): No   Lack of Transportation (Non-Medical): No  Physical Activity: Inactive   Days of Exercise per Week: 0 days   Minutes of Exercise per Session: 0 min  Stress: No Stress Concern Present   Feeling of Stress : Not at all  Social Connections: Socially Isolated   Frequency of Communication with Friends and Family: More than three  times a week   Frequency of Social Gatherings with Friends and Family: Once a week   Attends Religious Services: Never   Marine scientist or Organizations: No   Attends Archivist Meetings: Never   Marital Status: Widowed    Tobacco Counseling Counseling given: Not Answered   Clinical Intake:     Pain : 0-10 Pain Score: 4  Pain Location: Leg Pain Orientation: Right, Left     BMI - recorded: 36.11 Nutritional Status: BMI > 30  Obese Nutritional Risks: None Diabetes: No  How often do you need to have someone help you when you read instructions, pamphlets, or other written materials from your doctor or pharmacy?: 1 - Never  Diabetic? No  Interpreter Needed?: No  Information entered by :: Orrin Brigham LPN   Activities of Daily Living In your present state of health, do you have any difficulty performing the following activities: 05/17/2021  Hearing? Y  Vision? Y  Difficulty concentrating or making decisions? N  Comment ocassionally names but it comes back  Walking or climbing stairs? Y  Comment uses cane and walker  Dressing or bathing? N  Doing errands, shopping? N  Preparing Food and eating ? N  Using the Toilet? N  In the past six months, have you accidently leaked urine? Y  Do you have problems with loss of bowel control? N  Managing your Medications? N  Managing your Finances? N  Housekeeping or managing your  Housekeeping? N  Some recent data might be hidden    Patient Care Team: Tower, Wynelle Fanny, MD as PCP - Cyndia Diver, MD as PCP - Cardiology (Cardiology) Gaye Pollack, MD (Cardiothoracic Surgery) Donnamae Jude, MD as Consulting Physician (Obstetrics and Gynecology) Jodi Marble, MD as Consulting Physician (Otolaryngology) Lia Hopping, DDS as Consulting Physician (Dentistry) Luberta Mutter, MD as Consulting Physician (Ophthalmology)  Indicate any recent Medical Services you may have received from other than [facility] providers in the past year (date may be approximate).     Assessment:   This is a routine wellness examination for Missey.  Hearing/Vision screen Hearing Screening - Comments:: Wears hearing aids Vision Screening - Comments:: Last exam 08/2020, [facility]  Dietary issues and exercise activities discussed: Current Exercise Habits: The patient does not participate in regular exercise at present   Goals Addressed             This Visit's Progress    Patient Stated       Would like to drink more water       Depression Screen PHQ 2/9 Scores 05/17/2021 12/23/2019 11/19/2018 12/20/2017 11/11/2017 11/07/2016 09/15/2015  PHQ - 2 Score 0 0 0 0 0 0 0  PHQ- 9 Score - 0 0 - 0 - -    Fall Risk Fall Risk  05/17/2021 12/23/2019 11/19/2018 12/20/2017 11/11/2017  Falls in the past year? 0 0 0 Yes Yes  Comment - - - - Oct 15, 2017 fell due to dizziness  Number falls in past yr: 0 0 - 1 1  Injury with Fall? 0 0 - No No  Risk for fall due to : Impaired balance/gait Medication side effect;Impaired balance/gait - - Impaired balance/gait;Impaired mobility  Follow up Falls prevention discussed Falls evaluation completed;Falls prevention discussed - - -    FALL RISK PREVENTION PERTAINING TO THE HOME:  Any stairs in or around the home? Yes  If so, are there any without handrails? Yes  Home free of loose  throw rugs in walkways, pet beds, electrical cords,  etc? No  Adequate lighting in your home to reduce risk of falls? No   ASSISTIVE DEVICES UTILIZED TO PREVENT FALLS:  Life alert? No  Use of a cane, walker or w/c? Yes , cane and walker Grab bars in the bathroom? Yes  Shower chair or bench in shower? No  Elevated toilet seat or a handicapped toilet? No   TIMED UP AND GO:  Was the test performed? No , visit completed over the phone.    Cognitive Function: Normal cognitive status assessed by this Nurse Health Advisor. No abnormalities found.   MMSE - Mini Mental State Exam 12/23/2019 11/19/2018 11/11/2017 11/07/2016 09/15/2015  Orientation to time 5 5 5 5 5   Orientation to Place 5 5 5 5 5   Registration 3 3 3 3 3   Attention/ Calculation 5 0 0 0 0  Recall 3 3 3 3 3   Language- name 2 objects - 0 0 0 0  Language- repeat 1 1 1 1 1   Language- follow 3 step command - 0 3 3 3   Language- read & follow direction - 0 0 0 0  Write a sentence - 0 0 0 0  Copy design - 0 0 0 0  Total score - 17 20 20 20         Immunizations Immunization History  Administered Date(s) Administered   Fluad Quad(high Dose 65+) 01/22/2019   Influenza Split 03/27/2011, 03/25/2012   Influenza Whole 03/07/2007, 02/17/2008, 03/08/2009, 03/20/2010   Influenza, High Dose Seasonal PF 02/03/2021   Influenza,inj,Quad PF,6+ Mos 03/06/2013, 02/23/2014, 02/24/2016   Influenza-Unspecified 02/27/2015, 01/19/2017, 12/26/2017   Pneumococcal Conjugate-13 01/15/2014   Pneumococcal Polysaccharide-23 03/08/2009   Td 05/29/1995, 03/08/2009   Zoster, Live 08/12/2007    TDAP status: Due, Education has been provided regarding the importance of this vaccine. Advised may receive this vaccine at local pharmacy or Health Dept. Aware to provide a copy of the vaccination record if obtained from local pharmacy or Health Dept. Verbalized acceptance and understanding.  Flu Vaccine status: Up to date  Pneumococcal vaccine status: Up to date  Covid-19 vaccine status: Information provided  on how to obtain vaccines.   Qualifies for Shingles Vaccine? Yes   Zostavax completed Yes   Shingrix Completed?: No.    Education has been provided regarding the importance of this vaccine. Patient has been advised to call insurance company to determine out of pocket expense if they have not yet received this vaccine. Advised may also receive vaccine at local pharmacy or Health Dept. Verbalized acceptance and understanding.  Screening Tests Health Maintenance  Topic Date Due   COVID-19 Vaccine (1) Never done   Zoster Vaccines- Shingrix (1 of 2) Never done   MAMMOGRAM  02/15/2017   TETANUS/TDAP  12/23/2023 (Originally 03/09/2019)   Pneumonia Vaccine 25+ Years old  Completed   INFLUENZA VACCINE  Completed   DEXA SCAN  Completed   HPV VACCINES  Aged Out    Health Maintenance  Health Maintenance Due  Topic Date Due   COVID-19 Vaccine (1) Never done   Zoster Vaccines- Shingrix (1 of 2) Never done   MAMMOGRAM  02/15/2017    Colorectal cancer screening: No longer required.   Mammogram status: Ordered 05/17/21. Pt provided with contact info and advised to call to schedule appt.   Bone Density status: No longer required  Lung Cancer Screening: (Low Dose CT Chest recommended if Age 60-80 years, 30 pack-year currently smoking OR have quit w/in 15years.)  does not qualify.     Additional Screening:  Hepatitis C Screening: does not qualify  Vision Screening: Recommended annual ophthalmology exams for early detection of glaucoma and other disorders of the eye. Is the patient up to date with their annual eye exam?  Yes  Who is the provider or what is the name of the office in which the patient attends annual eye exams? Normal Screening: Recommended annual dental exams for proper oral hygiene  Community Resource Referral / Chronic Care Management: CRR required this visit?  No   CCM required this visit?  No      Plan:     I have personally reviewed  and noted the following in the patients chart:   Medical and social history Use of alcohol, tobacco or illicit drugs  Current medications and supplements including opioid prescriptions.  Functional ability and status Nutritional status Physical activity Advanced directives List of other physicians Hospitalizations, surgeries, and ER visits in previous 12 months Vitals Screenings to include cognitive, depression, and falls Referrals and appointments  In addition, I have reviewed and discussed with patient certain preventive protocols, quality metrics, and best practice recommendations. A written personalized care plan for preventive services as well as general preventive health recommendations were provided to patient.   Due to this being a telephonic visit, the after visit summary with patients personalized plan was offered to patient via mail or my-chart.  Patient preferred to pick up at office at next visit.    Loma Messing, LPN   [id number]/41/[id number]   Nurse Health Advisor  Nurse Notes: Patient states having occasional hallucinations of seeing people in her room when first waking up. Patient states that is last only a second and plans on discussing with PCP at appointment on 05/25/21.

## 2021-05-16 NOTE — Telephone Encounter (Signed)
-----   Message from Ellamae Sia sent at 05/09/2021  2:11 PM EST ----- Regarding: Lab orders for Wednesday, 12.21.22 Patient is scheduled for CPX labs, please order future labs, Thanks , Karna Christmas

## 2021-05-17 ENCOUNTER — Other Ambulatory Visit (INDEPENDENT_AMBULATORY_CARE_PROVIDER_SITE_OTHER): Payer: Medicare Other

## 2021-05-17 ENCOUNTER — Other Ambulatory Visit: Payer: Self-pay

## 2021-05-17 ENCOUNTER — Ambulatory Visit (INDEPENDENT_AMBULATORY_CARE_PROVIDER_SITE_OTHER): Payer: Medicare Other

## 2021-05-17 VITALS — Ht 65.0 in | Wt 217.0 lb

## 2021-05-17 DIAGNOSIS — Z0000 Encounter for general adult medical examination without abnormal findings: Secondary | ICD-10-CM | POA: Diagnosis not present

## 2021-05-17 DIAGNOSIS — Z1231 Encounter for screening mammogram for malignant neoplasm of breast: Secondary | ICD-10-CM

## 2021-05-17 DIAGNOSIS — E7800 Pure hypercholesterolemia, unspecified: Secondary | ICD-10-CM | POA: Diagnosis not present

## 2021-05-17 DIAGNOSIS — E559 Vitamin D deficiency, unspecified: Secondary | ICD-10-CM | POA: Diagnosis not present

## 2021-05-17 DIAGNOSIS — I10 Essential (primary) hypertension: Secondary | ICD-10-CM | POA: Diagnosis not present

## 2021-05-17 DIAGNOSIS — R7303 Prediabetes: Secondary | ICD-10-CM | POA: Diagnosis not present

## 2021-05-17 LAB — COMPREHENSIVE METABOLIC PANEL
ALT: 50 U/L — ABNORMAL HIGH (ref 0–35)
AST: 35 U/L (ref 0–37)
Albumin: 4.6 g/dL (ref 3.5–5.2)
Alkaline Phosphatase: 56 U/L (ref 39–117)
BUN: 18 mg/dL (ref 6–23)
CO2: 34 mEq/L — ABNORMAL HIGH (ref 19–32)
Calcium: 10.5 mg/dL (ref 8.4–10.5)
Chloride: 101 mEq/L (ref 96–112)
Creatinine, Ser: 0.85 mg/dL (ref 0.40–1.20)
GFR: 62.4 mL/min (ref >60.00)
Glucose, Bld: 128 mg/dL — ABNORMAL HIGH (ref 70–99)
Potassium: 4.3 mEq/L (ref 3.5–5.1)
Sodium: 140 mEq/L (ref 135–145)
Total Bilirubin: 0.9 mg/dL (ref 0.2–1.2)
Total Protein: 7 g/dL (ref 6.0–8.3)

## 2021-05-17 LAB — CBC WITH DIFFERENTIAL/PLATELET
Basophils Absolute: 0 10*3/uL (ref 0.0–0.1)
Basophils Relative: 0.6 % (ref 0.0–3.0)
Eosinophils Absolute: 0.1 10*3/uL (ref 0.0–0.7)
Eosinophils Relative: 2.1 % (ref 0.0–5.0)
HCT: 46.5 % — ABNORMAL HIGH (ref 36.0–46.0)
Hemoglobin: 15.6 g/dL — ABNORMAL HIGH (ref 12.0–15.0)
Lymphocytes Relative: 15.2 % (ref 12.0–46.0)
Lymphs Abs: 1 10*3/uL (ref 0.7–4.0)
MCHC: 33.6 g/dL (ref 30.0–36.0)
MCV: 88.7 fl (ref 78.0–100.0)
Monocytes Absolute: 0.5 10*3/uL (ref 0.1–1.0)
Monocytes Relative: 8.3 % (ref 3.0–12.0)
Neutro Abs: 4.8 10*3/uL (ref 1.4–7.7)
Neutrophils Relative %: 73.8 % (ref 43.0–77.0)
Platelets: 143 10*3/uL — ABNORMAL LOW (ref 150.0–400.0)
RBC: 5.25 Mil/uL — ABNORMAL HIGH (ref 3.87–5.11)
RDW: 13.9 % (ref 11.5–15.5)
WBC: 6.5 10*3/uL (ref 4.0–10.5)

## 2021-05-17 LAB — LIPID PANEL
Cholesterol: 191 mg/dL (ref 0–200)
HDL: 52.3 mg/dL (ref >39.00)
LDL Cholesterol: 121 mg/dL — ABNORMAL HIGH (ref 0–99)
NonHDL: 138.5
Total CHOL/HDL Ratio: 4
Triglycerides: 87 mg/dL (ref 0.0–149.0)
VLDL: 17.4 mg/dL (ref 0.0–40.0)

## 2021-05-17 LAB — VITAMIN D 25 HYDROXY (VIT D DEFICIENCY, FRACTURES): VITD: 50.37 ng/mL (ref 30.00–100.00)

## 2021-05-17 LAB — TSH: TSH: 2.69 u[IU]/mL (ref 0.35–5.50)

## 2021-05-17 LAB — HEMOGLOBIN A1C: Hgb A1c MFr Bld: 6.4 % (ref 4.6–6.5)

## 2021-05-17 NOTE — Patient Instructions (Signed)
[name] , Thank you for taking time to complete your Medicare Wellness Visit. I appreciate your ongoing commitment to your health goals. Please review the following plan we discussed and let me know if I can assist you in the future.   Screening recommendations/referrals: Colonoscopy: no longer required  Mammogram: due, last completed 02/15/17, ordered today, someone will call to schedule Bone Density: no longer required Recommended yearly ophthalmology/optometry visit for glaucoma screening and checkup Recommended yearly dental visit for hygiene and checkup  Vaccinations: Influenza vaccine: up to date Pneumococcal vaccine: up to date Tdap vaccine: Due- last completed 03/08/09 May obtain vaccine at our office or your local pharmacy. Shingles vaccine:  Discuss with your local pharmacy Covid-19: newest booster available at your local pharmacy  Advanced directives: Please bring a copy of Living Will and/or [place] for your chart.   Conditions/risks identified: see problem list  Next appointment: Follow up in one year for your annual wellness visit 05/23/22 @ 3:30pm, this will be a telephone visit.   Preventive Care 85 Years and Older, Female Preventive care refers to lifestyle choices and visits with your health care provider that can promote health and wellness. What does preventive care include? A yearly physical exam. This is also called an annual well check. Dental exams once or twice a year. Routine eye exams. Ask your health care provider how often you should have your eyes checked. Personal lifestyle choices, including: Daily care of your teeth and gums. Regular physical activity. Eating a healthy diet. Avoiding tobacco and drug use. Limiting alcohol use. Practicing safe sex. Taking low-dose aspirin every day. Taking vitamin and mineral supplements as recommended by your health care provider. What happens during an annual well check? The services and  screenings done by your health care provider during your annual well check will depend on your age, overall health, lifestyle risk factors, and family history of disease. Counseling  Your health care provider may ask you questions about your: Alcohol use. Tobacco use. Drug use. Emotional well-being. Home and relationship well-being. Sexual activity. Eating habits. History of falls. Memory and ability to understand (cognition). Work and work Statistician. Reproductive health. Screening  You may have the following tests or measurements: Height, weight, and BMI. Blood pressure. Lipid and cholesterol levels. These may be checked every 5 years, or more frequently if you are over 85 years old. Skin check. Lung cancer screening. You may have this screening every year starting at age 26 if you have a 30-pack-year history of smoking and currently smoke or have quit within the past 15 years. Fecal occult blood test (FOBT) of the stool. You may have this test every year starting at age 34. Flexible sigmoidoscopy or colonoscopy. You may have a sigmoidoscopy every 5 years or a colonoscopy every 10 years starting at age 85. Hepatitis C blood test. Hepatitis B blood test. Sexually transmitted disease (STD) testing. Diabetes screening. This is done by checking your blood sugar (glucose) after you have not eaten for a while (fasting). You may have this done every 1-3 years. Bone density scan. This is done to screen for osteoporosis. You may have this done starting at age 30. Mammogram. This may be done every 1-2 years. Talk to your health care provider about how often you should have regular mammograms. Talk with your health care provider about your test results, treatment options, and if necessary, the need for more tests. Vaccines  Your health care provider may recommend certain vaccines, such as: Influenza vaccine.  This is recommended every year. Tetanus, diphtheria, and acellular pertussis (Tdap,  Td) vaccine. You may need a Td booster every 10 years. Zoster vaccine. You may need this after age 70. Pneumococcal 13-valent conjugate (PCV13) vaccine. One dose is recommended after age 67. Pneumococcal polysaccharide (PPSV23) vaccine. One dose is recommended after age 15. Talk to your health care provider about which screenings and vaccines you need and how often you need them. This information is not intended to replace advice given to you by your health care provider. Make sure you discuss any questions you have with your health care provider. Document Released: 06/10/2015 Document Revised: 02/01/2016 Document Reviewed: 03/15/2015 Elsevier Interactive Patient Education  2017 [place] Prevention in the Home Falls can cause injuries. They can happen to people of all ages. There are many things you can do to make your home safe and to help prevent falls. What can I do on the outside of my home? Regularly fix the edges of walkways and driveways and fix any cracks. Remove anything that might make you trip as you walk through a door, such as a raised step or threshold. Trim any bushes or trees on the path to your home. Use bright outdoor lighting. Clear any walking paths of anything that might make someone trip, such as rocks or tools. Regularly check to see if handrails are loose or broken. Make sure that both sides of any steps have handrails. Any raised decks and porches should have guardrails on the edges. Have any leaves, snow, or ice cleared regularly. Use sand or salt on walking paths during winter. Clean up any spills in your garage right away. This includes oil or grease spills. What can I do in the bathroom? Use night lights. Install grab bars by the toilet and in the tub and shower. Do not use towel bars as grab bars. Use non-skid mats or decals in the tub or shower. If you need to sit down in the shower, use a plastic, non-slip stool. Keep the floor dry. Clean up any  water that spills on the floor as soon as it happens. Remove soap buildup in the tub or shower regularly. Attach bath mats securely with double-sided non-slip rug tape. Do not have throw rugs and other things on the floor that can make you trip. What can I do in the bedroom? Use night lights. Make sure that you have a light by your bed that is easy to reach. Do not use any sheets or blankets that are too big for your bed. They should not hang down onto the floor. Have a firm chair that has side arms. You can use this for support while you get dressed. Do not have throw rugs and other things on the floor that can make you trip. What can I do in the kitchen? Clean up any spills right away. Avoid walking on wet floors. Keep items that you use a lot in easy-to-reach places. If you need to reach something above you, use a strong step stool that has a grab bar. Keep electrical cords out of the way. Do not use floor polish or wax that makes floors slippery. If you must use wax, use non-skid floor wax. Do not have throw rugs and other things on the floor that can make you trip. What can I do with my stairs? Do not leave any items on the stairs. Make sure that there are handrails on both sides of the stairs and use them. Fix handrails  that are broken or loose. Make sure that handrails are as long as the stairways. Check any carpeting to make sure that it is firmly attached to the stairs. Fix any carpet that is loose or worn. Avoid having throw rugs at the top or bottom of the stairs. If you do have throw rugs, attach them to the floor with carpet tape. Make sure that you have a light switch at the top of the stairs and the bottom of the stairs. If you do not have them, ask someone to add them for you. What else can I do to help prevent falls? Wear shoes that: Do not have high heels. Have rubber bottoms. Are comfortable and fit you well. Are closed at the toe. Do not wear sandals. If you use a  stepladder: Make sure that it is fully opened. Do not climb a closed stepladder. Make sure that both sides of the stepladder are locked into place. Ask someone to hold it for you, if possible. Clearly mark and make sure that you can see: Any grab bars or handrails. First and last steps. Where the edge of each step is. Use tools that help you move around (mobility aids) if they are needed. These include: Canes. Walkers. Scooters. Crutches. Turn on the lights when you go into a dark area. Replace any light bulbs as soon as they burn out. Set up your furniture so you have a clear path. Avoid moving your furniture around. If any of your floors are uneven, fix them. If there are any pets around you, be aware of where they are. Review your medicines with your doctor. Some medicines can make you feel dizzy. This can increase your chance of falling. Ask your doctor what other things that you can do to help prevent falls. This information is not intended to replace advice given to you by your health care provider. Make sure you discuss any questions you have with your health care provider. Document Released: 03/10/2009 Document Revised: 10/20/2015 Document Reviewed: 06/18/2014 Elsevier Interactive Patient Education  2017 [employer].

## 2021-05-25 ENCOUNTER — Other Ambulatory Visit: Payer: Self-pay | Admitting: Family Medicine

## 2021-05-25 ENCOUNTER — Other Ambulatory Visit: Payer: Self-pay

## 2021-05-25 ENCOUNTER — Ambulatory Visit (INDEPENDENT_AMBULATORY_CARE_PROVIDER_SITE_OTHER): Payer: Medicare Other | Admitting: Family Medicine

## 2021-05-25 VITALS — BP 128/72 | HR 59 | Temp 97.2°F | Ht 65.0 in | Wt 217.2 lb

## 2021-05-25 DIAGNOSIS — R600 Localized edema: Secondary | ICD-10-CM

## 2021-05-25 DIAGNOSIS — E559 Vitamin D deficiency, unspecified: Secondary | ICD-10-CM | POA: Diagnosis not present

## 2021-05-25 DIAGNOSIS — E7800 Pure hypercholesterolemia, unspecified: Secondary | ICD-10-CM | POA: Diagnosis not present

## 2021-05-25 DIAGNOSIS — E2839 Other primary ovarian failure: Secondary | ICD-10-CM | POA: Diagnosis not present

## 2021-05-25 DIAGNOSIS — I10 Essential (primary) hypertension: Secondary | ICD-10-CM

## 2021-05-25 DIAGNOSIS — I5032 Chronic diastolic (congestive) heart failure: Secondary | ICD-10-CM | POA: Diagnosis not present

## 2021-05-25 DIAGNOSIS — Z1231 Encounter for screening mammogram for malignant neoplasm of breast: Secondary | ICD-10-CM

## 2021-05-25 DIAGNOSIS — R7303 Prediabetes: Secondary | ICD-10-CM | POA: Diagnosis not present

## 2021-05-25 NOTE — Patient Instructions (Addendum)
If you are interested in the new shingles vaccine (Shingrix) - call your local pharmacy to check on coverage and availability  If affordable, get on a wait list at your pharmacy to get the vaccine.  Sitting exercise programs are good Also water exercise programs   To prevent diabetes Try to get most of your carbohydrates from produce (with the exception of white potatoes)  Eat less bread/pasta/rice/snack foods/cereals/sweets and other items from the middle of the grocery store (processed carbs)  For cholesterol Avoid red meat/ fried foods/ egg yolks/ fatty breakfast meats/ butter, cheese and high fat dairy/ and shellfish    Keep taking good care of yourself   Call and schedule your bone density test when you can   Please call the location of your choice from the menu below to schedule your Mammogram and/or Bone Density appointment.    [facility]                      Phone:  [phone], [place] [address]                                                             Services: Traditional and 3D Mammogram, [facility]                 Phone: [phone] [address], [place] [address]    Service: Bone Density ONLY   *this site does NOT perform mammograms  [place]                        Phone:  [phone] [address], [place] [address]                                            Services:  3D Mammogram and [place] at [facility], [place]   Phone:  [phone]   [place]  [place], [place] [address]                                            Services: 3D Mammogram and  [place]  [place] at [facility])  Phone:  [phone]   [address] [place],  [address]                                              Services:  3D Mammogram and Bone Density

## 2021-05-25 NOTE — Assessment & Plan Note (Signed)
bp in fair control at this time  BP Readings from Last 1 Encounters:  05/25/21 128/72   No changes needed Most recent labs reviewed  Disc lifstyle change with low sodium diet and exercise  Plan to continue Spironolactone 25 mg daily  Lasix 20 mg daily  Coreg 25 mg bid

## 2021-05-25 NOTE — Assessment & Plan Note (Signed)
Disc goals for lipids and reasons to control them Rev last labs with pt Rev low sat fat diet in detail LDL is up mildly to 121 Enc to avoid red meat when able  Overall fairly healthy diet with fish and fruit/veg

## 2021-05-25 NOTE — Assessment & Plan Note (Signed)
Enc pt to wear supp hose Also has venous stasis dermatitis and dermatology helps

## 2021-05-25 NOTE — Progress Notes (Signed)
Subjective:    Patient ID: [name], female    DOB: May 09, 1934, 87 y.o.   MRN: [id number]  This visit occurred during the SARS-CoV-2 public health emergency.  Safety protocols were in place, including screening questions prior to the visit, additional usage of staff PPE, and extensive cleaning of exam room while observing appropriate contact time as indicated for disinfecting solutions.   HPI Pt presents for annual f/u of chronic health problems   Wt Readings from Last 3 Encounters:  05/25/21 217 lb 4 oz (98.5 kg)  05/17/21 217 lb (98.4 kg)  05/30/20 219 lb 9.6 oz (99.6 kg)   36.15 kg/m  Feels ok today  Better in the mornings usually  Feeling her age    Amw from 05/17/21 reviewed    Covid status -declines vaccine , has not had covid Zoster status - liver vaccine gave her shingles  Had her tetanus shot at [facility] is due and scheduled for jan 20th  Self breast exam -no lumps or changes  Has scar tissue on R from lumpectomy   Dexa 12/2013 -in the normal range  Falls -none /very careful  Uses walker if out of house Fractures -none Supplements -vitamin D daily 2000 iu Exercise - misses this, too hard now (chronically tired)  Walking is tough  Has sitting exercise program (sit and be fit program)   Depression screen [facility] 2/9 05/17/2021 12/23/2019 11/19/2018 12/20/2017 11/11/2017  Decreased Interest 0 0 0 0 0  Down, Depressed, Hopeless 0 0 0 0 0  PHQ - 2 Score 0 0 0 0 0  Altered sleeping - 0 0 - 0  Tired, decreased energy - 0 0 - 0  Change in appetite - 0 0 - 0  Feeling bad or failure about yourself  - 0 0 - 0  Trouble concentrating - 0 0 - 0  Moving slowly or fidgety/restless - 0 0 - 0  Suicidal thoughts - 0 0 - 0  PHQ-9 Score - 0 0 - 0  Difficult doing work/chores - Not difficult at all Not difficult at all - Not difficult at all  Some recent data might be hidden     HTN in setting of CHF bp is stable today  No cp or palpitations or headaches or edema   No side effects to medicines  BP Readings from Last 3 Encounters:  05/25/21 128/72  05/30/20 (!) 130/50  12/25/19 (!) 135/60     Taking spironolactone 25 mg daily  Lasix 20 mg daily  coreg 25 mg bid   Chronic leg swelling still bothers her  Cariology has worked with her diuretics  Has some good compression socks - makes her sore  Elevates when sitting   Very good breathing   H/o vit D def D level is 50.3  Prediabetes Lab Results  Component Value Date   HGBA1C 6.4 05/17/2021  This is up from 6.0  Weight is up  Needs to eat better  Trying hard to avoid sugar and has dropped a few lb recently    Hyperlipidemia Lab Results  Component Value Date   CHOL 191 05/17/2021   CHOL 177 12/17/2019   CHOL 194 11/24/2018   Lab Results  Component Value Date   HDL 52.30 05/17/2021   HDL 50.90 12/17/2019   HDL 54.00 11/24/2018   Lab Results  Component Value Date   LDLCALC 121 (H) 05/17/2021   LDLCALC 109 (H) 12/17/2019   LDLCALC 122 (H) 11/24/2018  Lab Results  Component Value Date   TRIG 87.0 05/17/2021   TRIG 88.0 12/17/2019   TRIG 90.0 11/24/2018   Lab Results  Component Value Date   CHOLHDL 4 05/17/2021   CHOLHDL 3 12/17/2019   CHOLHDL 4 11/24/2018   Lab Results  Component Value Date   LDLDIRECT 162.4 03/18/2012   LDLDIRECT 147.6 03/16/2010  Statin rxn in past LDL is up a bit from last year  Sometimes uses canola spray/ uses that instead of frying foods Red meat-less than once per week  Really likes to cook   ALT was mildly elevated Lab Results  Component Value Date   ALT 50 (H) 05/17/2021   AST 35 05/17/2021   ALKPHOS 56 05/17/2021   BILITOT 0.9 05/17/2021   It has been up in 2019 and 2020  Hb is baseline over 15 Lab Results  Component Value Date   WBC 6.5 05/17/2021   HGB 15.6 (H) 05/17/2021   HCT 46.5 (H) 05/17/2021   MCV 88.7 05/17/2021   PLT 143.0 (L) 05/17/2021  Platelet ct is stable (last time 134) no symptoms   Lab Results   Component Value Date   TSH 2.69 05/17/2021     Patient Active Problem List   Diagnosis Date Noted   Estrogen deficiency 05/25/2021   Yeast infection of the skin 12/27/2019   Chronic diastolic heart failure ([facility]) 12/01/2018   Episodic lightheadedness 03/22/2017   Urge incontinence 10/15/2016   Nonallopathic lesion of lumbar region 03/28/2015   Nonallopathic lesion of thoracic region 03/28/2015   Encounter for Medicare annual wellness exam 01/15/2014   Risk for falls 03/25/2012   Vitamin D deficiency 08/29/2009   DEPENDENT EDEMA, LEGS 09/07/2008   CONSTIPATION 07/30/2008   Prediabetes 07/09/2008   History of endometrial cancer 10/28/2007   HEARING LOSS, LEFT EAR 10/15/2007   HEMORRHOIDS, INTERNAL 10/15/2007   EXTERNAL HEMORRHOIDS 10/15/2007   ESOPHAGEAL SPASM 10/15/2007   DIVERTICULOSIS, COLON 10/15/2007   Hyperlipidemia 10/07/2007   Essential hypertension 10/07/2007   CAROTID BRUITS, BILATERAL 11/25/2005   AORTIC STENOSIS 10/26/2004   HIATAL HERNIA 12/27/1995   Past Medical History:  Diagnosis Date   Alcohol abuse, unspecified    Aortic stenosis    s/p tissue AVR 05/2011   Arthritis    Breast CA ([facility])    rt   Diaphragmatic hernia without mention of obstruction or gangrene    Diverticulosis of colon (without mention of hemorrhage)    Dyskinesia of esophagus    Endometrial cancer ([place])    Family history of colonic polyps    Heart murmur    HLD (hyperlipidemia)    HTN (hypertension)    Internal hemorrhoids without mention of complication    Malignant neoplasm of corpus uteri, except isthmus (HCC)    Other abnormal glucose    Pain in joint, shoulder region    Postmenopausal bleeding    Tobacco use disorder    Unspecified hearing loss    Vertigo    Past Surgical History:  Procedure Laterality Date   AORTIC VALVE REPLACEMENT  06/13/2011   Procedure: AORTIC VALVE REPLACEMENT (AVR);  Surgeon: Gaye Pollack, MD;  Location: [facility];  Service: Open Heart Surgery;   Laterality: N/A;   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   BREAST LUMPECTOMY  11/2009   DCIS, neg sentinel lymph node biopsy   CATARACT EXTRACTION     bil   LAPAROSCOPY W/ TOTAL BILATERAL PELVIC AND PERI-AORTIC LYMPHADENECTOMY  11/04/2007   PARAAORTIC LYMPHADENECTOMY  ROTATOR CUFF REPAIR  05/2008   right   TOTAL ABDOMINAL HYSTERECTOMY W/ BILATERAL SALPINGOOPHORECTOMY  11/04/07   salpingo-oophrectomy   Social History   Tobacco Use   Smoking status: Former    Packs/day: 1.00    Types: Cigarettes    Quit date: 03/28/2000    Years since quitting: 21.1   Smokeless tobacco: Never  Vaping Use   Vaping Use: Never used  Substance Use Topics   Alcohol use: Not Currently    Alcohol/week: 0.0 standard drinks   Drug use: Never   Family History  Problem Relation Age of Onset   Colon polyps Sister        2010   Heart disease Mother    Heart attack Mother    Stroke Father    Heart failure Father    Diabetes type II Father    Diabetes type II Brother    Cancer Maternal Aunt        possible post menopausal breast cancer   Allergies  Allergen Reactions   Aspirin Other (See Comments)    dizzy   Calcium     REACTION: constipation   Diovan [Valsartan]     REACTION: abdominal pain   Ezetimibe     REACTION: fatigue   Ramipril     REACTION: abdominal pain   Simvastatin     REACTION: leg pain and body aches   Current Outpatient Medications on File Prior to Visit  Medication Sig Dispense Refill   amoxicillin (AMOXIL) 500 MG capsule Take 2,000 mg by mouth daily. Take prior to dental     carvedilol (COREG) 25 MG tablet TAKE 1 TABLET (25 MG TOTAL) BY MOUTH 2 (TWO) TIMES DAILY WITH A MEAL. 180 tablet 0   Cholecalciferol (VITAMIN D) 1000 UNITS capsule Take 1 capsule (1,000 Units total) by mouth 2 (two) times daily. 180 capsule 3   diphenhydrAMINE (BENADRYL) 25 mg capsule Take 25 mg by mouth every 6 (six) hours as needed. Patient taking to help sleep     furosemide (LASIX) 20 MG tablet Take 1 tablet  (20 mg total) by mouth daily. 90 tablet 3   Omega-3 Fatty Acids (FISH OIL) 1000 MG CAPS Take 2,000 mg by mouth daily.     potassium chloride SA (KLOR-CON M20) 20 MEQ tablet Take 1 tablet (20 mEq total) by mouth daily. 90 tablet 3   Psyllium (METAMUCIL PO) Take 1 Dose by mouth as directed.      spironolactone (ALDACTONE) 25 MG tablet TAKE 1 TABLET (25 MG TOTAL) BY MOUTH DAILY. 90 tablet 3   No current facility-administered medications on file prior to visit.    Review of Systems  Constitutional:  Positive for fatigue. Negative for activity change, appetite change, fever and unexpected weight change.  HENT:  Negative for congestion, ear pain, rhinorrhea, sinus pressure and sore throat.   Eyes:  Negative for pain, redness and visual disturbance.  Respiratory:  Negative for cough, shortness of breath and wheezing.   Cardiovascular:  Positive for leg swelling. Negative for chest pain and palpitations.  Gastrointestinal:  Negative for abdominal pain, blood in stool, constipation and diarrhea.  Endocrine: Negative for polydipsia and polyuria.  Genitourinary:  Negative for dysuria, frequency and urgency.  Musculoskeletal:  Positive for arthralgias and back pain. Negative for myalgias.  Skin:  Negative for pallor and rash.  Allergic/Immunologic: Negative for environmental allergies.  Neurological:  Negative for dizziness, syncope and headaches.  Hematological:  Negative for adenopathy. Does not bruise/bleed easily.  Psychiatric/Behavioral:  Negative for decreased concentration and dysphoric mood. The patient is not nervous/anxious.       Objective:   Physical Exam Constitutional:      General: She is not in acute distress.    Appearance: Normal appearance. She is well-developed. She is obese. She is not ill-appearing or diaphoretic.  HENT:     Head: Normocephalic and atraumatic.     Right Ear: Tympanic membrane, ear canal and external ear normal.     Left Ear: Tympanic membrane, ear canal and  external ear normal.     Nose: Nose normal. No congestion.     Mouth/Throat:     Mouth: Mucous membranes are moist.     Pharynx: Oropharynx is clear. No posterior oropharyngeal erythema.  Eyes:     General: No scleral icterus.    Extraocular Movements: Extraocular movements intact.     Conjunctiva/sclera: Conjunctivae normal.     Pupils: Pupils are equal, round, and reactive to light.  Neck:     Thyroid: No thyromegaly.     Vascular: No carotid bruit or JVD.  Cardiovascular:     Rate and Rhythm: Normal rate and regular rhythm.     Pulses: Normal pulses.     Heart sounds: Normal heart sounds.    No gallop.  Pulmonary:     Effort: Pulmonary effort is normal. No respiratory distress.     Breath sounds: Normal breath sounds. No wheezing.     Comments: Good air exch Chest:     Chest wall: No tenderness.  Abdominal:     General: Bowel sounds are normal. There is no distension or abdominal bruit.     Palpations: Abdomen is soft. There is no mass.     Tenderness: There is no abdominal tenderness.     Hernia: No hernia is present.  Genitourinary:    Comments: Breast exam: No mass, nodules, thickening, tenderness, bulging, retraction, inflamation, nipple discharge or skin changes noted.  No axillary or clavicular LA.     Musculoskeletal:        General: No tenderness. Normal range of motion.     Cervical back: Normal range of motion and neck supple. No rigidity. No muscular tenderness.     Right lower leg: No edema.     Left lower leg: No edema.     Comments: No kyphosis   Lymphadenopathy:     Cervical: No cervical adenopathy.  Skin:    General: Skin is warm and dry.     Coloration: Skin is not pale.     Findings: No erythema or rash.     Comments: Solar lentigines diffusely Some comedones on back  Neurological:     Mental Status: She is alert. Mental status is at baseline.     Cranial Nerves: No cranial nerve deficit.     Motor: No abnormal muscle tone.     Coordination:  Coordination normal.     Gait: Gait normal.     Deep Tendon Reflexes: Reflexes are normal and symmetric. Reflexes normal.  Psychiatric:        Attention and Perception: Attention normal.        Mood and Affect: Mood normal.        Cognition and Memory: Cognition and memory normal.     Comments: Mentally sharp           Assessment & Plan:   Problem List Items Addressed This Visit       Cardiovascular and Mediastinum   Essential hypertension - Primary  bp in fair control at this time  BP Readings from Last 1 Encounters:  05/25/21 128/72  No changes needed Most recent labs reviewed  Disc lifstyle change with low sodium diet and exercise  Plan to continue Spironolactone 25 mg daily  Lasix 20 mg daily  Coreg 25 mg bid       Chronic diastolic heart failure (HCC)    Fairly stable, no breathing complaints in setting of aortic stenosis with valve repl Under cardiology care        Other   Vitamin D deficiency    Plan to continue 2000 iu daily  Level is 50.3 Vitamin D level is therapeutic with current supplementation Disc importance of this to bone and overall health       Hyperlipidemia    Disc goals for lipids and reasons to control them Rev last labs with pt Rev low sat fat diet in detail LDL is up mildly to 121 Enc to avoid red meat when able  Overall fairly healthy diet with fish and fruit/veg      DEPENDENT EDEMA, LEGS    Enc pt to wear supp hose Also has venous stasis dermatitis and dermatology helps      Prediabetes    Lab Results  Component Value Date   HGBA1C 6.4 05/17/2021  This is up  Given handouts re: pre dm and diet  disc imp of low glycemic diet and wt loss to prevent DM2       Estrogen deficiency    dexa ordered for OP screening  Pt takes vit D Exercise is as tolerated-discussed sitting exercise programs which she enjoys       Relevant Orders   DG Bone Density

## 2021-05-25 NOTE — Assessment & Plan Note (Signed)
Fairly stable, no breathing complaints in setting of aortic stenosis with valve repl Under cardiology care

## 2021-05-25 NOTE — Assessment & Plan Note (Signed)
dexa ordered for OP screening  Pt takes vit D Exercise is as tolerated-discussed sitting exercise programs which she enjoys

## 2021-05-25 NOTE — Assessment & Plan Note (Signed)
Plan to continue 2000 iu daily  Level is 50.3 Vitamin D level is therapeutic with current supplementation Disc importance of this to bone and overall health

## 2021-05-25 NOTE — Assessment & Plan Note (Signed)
Lab Results  Component Value Date   HGBA1C 6.4 05/17/2021   This is up  Given handouts re: pre dm and diet  disc imp of low glycemic diet and wt loss to prevent DM2

## 2021-05-26 ENCOUNTER — Other Ambulatory Visit: Payer: Self-pay | Admitting: Cardiovascular Disease

## 2021-06-06 ENCOUNTER — Telehealth: Payer: Self-pay | Admitting: Family Medicine

## 2021-06-06 DIAGNOSIS — E2839 Other primary ovarian failure: Secondary | ICD-10-CM

## 2021-06-06 NOTE — Telephone Encounter (Signed)
Spoke with patient. Bone Density order already put in. The number patient called was not the right one. [facility] scheduling is [phone]. Patient verbalized understanding.

## 2021-06-06 NOTE — Addendum Note (Signed)
Addended by: Loura Pardon A on: 06/06/2021 04:32 PM   Modules accepted: Orders

## 2021-06-06 NOTE — Telephone Encounter (Signed)
[name] called in wanted to know about getting a referral for a bone density scan in [place] on [address]

## 2021-06-06 NOTE — Telephone Encounter (Signed)
Pt called [phone] and they told her that the provider would have to give an order to get bone density scan

## 2021-06-06 NOTE — Telephone Encounter (Signed)
I put the order in so she can call to schedule it

## 2021-06-13 ENCOUNTER — Other Ambulatory Visit: Payer: Self-pay

## 2021-06-13 ENCOUNTER — Ambulatory Visit (INDEPENDENT_AMBULATORY_CARE_PROVIDER_SITE_OTHER)
Admission: RE | Admit: 2021-06-13 | Discharge: 2021-06-13 | Disposition: A | Discharge status: Home / Self Care | Payer: Medicare Other | Source: Ambulatory Visit | Attending: Family Medicine | Admitting: Family Medicine

## 2021-06-13 DIAGNOSIS — E2839 Other primary ovarian failure: Secondary | ICD-10-CM | POA: Diagnosis not present

## 2021-06-16 ENCOUNTER — Ambulatory Visit
Admission: RE | Admit: 2021-06-16 | Discharge: 2021-06-16 | Disposition: A | Discharge status: Home / Self Care | Payer: Medicare Other | Source: Ambulatory Visit | Attending: Family Medicine | Admitting: Family Medicine

## 2021-06-16 DIAGNOSIS — Z1231 Encounter for screening mammogram for malignant neoplasm of breast: Secondary | ICD-10-CM | POA: Diagnosis not present

## 2021-06-21 ENCOUNTER — Other Ambulatory Visit: Payer: Self-pay | Admitting: Cardiovascular Disease

## 2021-08-23 ENCOUNTER — Other Ambulatory Visit: Payer: Self-pay | Admitting: Family Medicine

## 2021-08-29 ENCOUNTER — Encounter: Payer: Self-pay | Admitting: Cardiovascular Disease

## 2021-08-29 ENCOUNTER — Ambulatory Visit (INDEPENDENT_AMBULATORY_CARE_PROVIDER_SITE_OTHER): Payer: Medicare Other | Admitting: Cardiovascular Disease

## 2021-08-29 VITALS — BP 126/70 | HR 65 | Ht 65.5 in | Wt 206.4 lb

## 2021-08-29 DIAGNOSIS — I359 Nonrheumatic aortic valve disorder, unspecified: Secondary | ICD-10-CM | POA: Diagnosis not present

## 2021-08-29 DIAGNOSIS — I5032 Chronic diastolic (congestive) heart failure: Secondary | ICD-10-CM | POA: Diagnosis not present

## 2021-08-29 DIAGNOSIS — I10 Essential (primary) hypertension: Secondary | ICD-10-CM | POA: Diagnosis not present

## 2021-08-29 NOTE — Patient Instructions (Signed)
Medication Instructions:  ?Your physician recommends that you continue on your current medications as directed. Please refer to the Current Medication list given to you today. ? ?*If you need a refill on your cardiac medications before your next appointment, please call your pharmacy* ? ? ?Lab Work: ?NONE ?If you have labs (blood work) drawn today and your tests are completely normal, you will receive your results only by: ?MyChart Message (if you have MyChart) OR ?A paper copy in the mail ?If you have any lab test that is abnormal or we need to change your treatment, we will call you to review the results. ? ? ?Testing/Procedures: ?ECHO in 1 year (same day appt) ?Your physician has requested that you have an echocardiogram. Echocardiography is a painless test that uses sound waves to create images of your heart. It provides your doctor with information about the size and shape of your heart and how well your heart?s chambers and valves are working. This procedure takes approximately one hour. There are no restrictions for this procedure. ? ? ? ?Follow-Up: ?At [facility], you and your health needs are our priority.  As part of our continuing mission to provide you with exceptional heart care, we have created designated Provider Care Teams.  These Care Teams include your primary Cardiologist (physician) and Advanced Practice Providers (APPs -  Physician Assistants and Nurse Practitioners) who all work together to provide you with the care you need, when you need it. ? ?We recommend signing up for the patient portal called "MyChart".  Sign up information is provided on this After Visit Summary.  MyChart is used to connect with patients for Virtual Visits (Telemedicine).  Patients are able to view lab/test results, encounter notes, upcoming appointments, etc.  Non-urgent messages can be sent to your provider as well.   ?To learn more about what you can do with MyChart, go to [email or web address].   ? ?Your next  appointment:   ?1 year(s) ? ?The format for your next appointment:   ?In Person ? ?Provider:   ?Sherren Mocha, MD   ? ?  ?

## 2021-08-29 NOTE — Progress Notes (Signed)
?Cardiology Office Note:   ? ?Date:  08/29/2021  ? ?ID:  [name], DOB 09-23-1933, MRN [id number] ? ?PCP:  Abner Greenspan, MD ?  ?[facility] Providers ?Cardiologist:  Sherren Mocha, MD    ? ?Referring MD: Abner Greenspan, MD  ? ?Chief Complaint  ?Patient presents with  ? Follow-up  ?  Aortic Valve Disease  ? ? ?History of Present Illness:   ? ?[name] is a 88 y.o. female with a hx of: ?Aortic stenosis ?Status post bioprosthetic AVR 05/2011 ?Pt DC'd ASA 2/2 to dizziness  ?Chronic diastolic CHF ?Breast cancer ?Endometrial cancer ?Hypertension ?Hyperlipidemia ?Leg edema ? ?The patient is here alone today.  She is feeling better this year.  When I reviewed last year's note, she complained of progressive fatigue and gait instability.  She has changed her diet, now avoiding sugars and sweets, having lost 13 pounds since last year.  Her breathing is improved and energy level is better.  An echocardiogram done after last year's visit showed normal function of her aortic bioprosthesis with grade 1 diastolic dysfunction and vigorous LV systolic function.  The patient still has some issues with gait instability and she ambulates with a walker.  She reports no falls.  Her chronic leg swelling is unchanged.  She denies orthopnea, PND, heart palpitations, or chest pain. ? ?Past Medical History:  ?Diagnosis Date  ? Alcohol abuse, unspecified   ? Aortic stenosis   ? s/p tissue AVR 05/2011  ? Arthritis   ? Breast CA ([name])   ? rt  ? Diaphragmatic hernia without mention of obstruction or gangrene   ? Diverticulosis of colon (without mention of hemorrhage)   ? Dyskinesia of esophagus   ? Endometrial cancer ([place])   ? Family history of colonic polyps   ? Heart murmur   ? HLD (hyperlipidemia)   ? HTN (hypertension)   ? Internal hemorrhoids without mention of complication   ? Malignant neoplasm of corpus uteri, except isthmus ([place])   ? Other abnormal glucose   ? Pain in joint, shoulder region   ? Postmenopausal bleeding   ? Tobacco  use disorder   ? Unspecified hearing loss   ? Vertigo   ? ? ?Past Surgical History:  ?Procedure Laterality Date  ? AORTIC VALVE REPLACEMENT  06/13/2011  ? Procedure: AORTIC VALVE REPLACEMENT (AVR);  Surgeon: Gaye Pollack, MD;  Location: [facility];  Service: Open Heart Surgery;  Laterality: N/A;  ? BREAST BIOPSY  5/11  ? DCIS  ? BREAST LUMPECTOMY  11/2009  ? DCIS, neg sentinel lymph node biopsy  ? CATARACT EXTRACTION    ? bil  ? LAPAROSCOPY W/ TOTAL BILATERAL PELVIC AND PERI-AORTIC LYMPHADENECTOMY  11/04/2007  ? PARAAORTIC LYMPHADENECTOMY    ? ROTATOR CUFF REPAIR  05/2008  ? right  ? TOTAL ABDOMINAL HYSTERECTOMY W/ BILATERAL SALPINGOOPHORECTOMY  11/04/07  ? salpingo-oophrectomy  ? ? ?Current Medications: ?Current Meds  ?Medication Sig  ? amoxicillin (AMOXIL) 500 MG capsule Take 2,000 mg by mouth daily. Take prior to dental  ? carvedilol (COREG) 25 MG tablet TAKE 1 TABLET (25 MG TOTAL) BY MOUTH TWICE A DAY WITH MEALS  ? Cholecalciferol (VITAMIN D) 1000 UNITS capsule Take 1 capsule (1,000 Units total) by mouth 2 (two) times daily.  ? diphenhydrAMINE (BENADRYL) 25 mg capsule Take 25 mg by mouth every 6 (six) hours as needed. Patient taking to help sleep  ? furosemide (LASIX) 20 MG tablet TAKE 1 TABLET BY MOUTH EVERY DAY  ?  Omega-3 Fatty Acids (FISH OIL) 1000 MG CAPS Take 2,000 mg by mouth daily.  ? potassium chloride SA (KLOR-CON M20) 20 MEQ tablet Take 1 tablet (20 mEq total) by mouth daily.  ? Psyllium (METAMUCIL PO) Take 1 Dose by mouth as directed.   ? spironolactone (ALDACTONE) 25 MG tablet Take 1 tablet (25 mg total) by mouth daily. Please keep upcoming appointment in May 2023 for future refills.thank you  ?  ? ?Allergies:   Aspirin, Calcium, Diovan [valsartan], Ezetimibe, Ramipril, and Simvastatin  ? ?Social History  ? ?Socioeconomic History  ? Marital status: Widowed  ?  Spouse name: Not on file  ? Number of children: 2  ? Years of education: Not on file  ? Highest education level: High school graduate  ?Occupational  History  ? Not on file  ?Tobacco Use  ? Smoking status: Former  ?  Packs/day: 1.00  ?  Types: Cigarettes  ?  Quit date: 03/28/2000  ?  Years since quitting: 21.4  ? Smokeless tobacco: Never  ?Vaping Use  ? Vaping Use: Never used  ?Substance and Sexual Activity  ? Alcohol use: Not Currently  ?  Alcohol/week: 0.0 standard drinks  ? Drug use: Never  ? Sexual activity: Not Currently  ?Other Topics Concern  ? Not on file  ?Social History Narrative  ? Lives at home alone  ? Right handed  ? Caffeine: 1-2 cups daily  ? ?Social Determinants of Health  ? ?Financial Resource Strain: Low Risk   ? Difficulty of Paying Living Expenses: Not hard at all  ?Food Insecurity: No Food Insecurity  ? Worried About Charity fundraiser in the Last Year: Never true  ? Ran Out of Food in the Last Year: Never true  ?Transportation Needs: No Transportation Needs  ? Lack of Transportation (Medical): No  ? Lack of Transportation (Non-Medical): No  ?Physical Activity: Inactive  ? Days of Exercise per Week: 0 days  ? Minutes of Exercise per Session: 0 min  ?Stress: No Stress Concern Present  ? Feeling of Stress : Not at all  ?Social Connections: Socially Isolated  ? Frequency of Communication with Friends and Family: More than three times a week  ? Frequency of Social Gatherings with Friends and Family: Once a week  ? Attends Religious Services: Never  ? Active Member of Clubs or Organizations: No  ? Attends Archivist Meetings: Never  ? Marital Status: Widowed  ?  ? ?Family History: ?The patient's family history includes Cancer in her maternal aunt; Colon polyps in her sister; Diabetes type II in her brother and father; Heart attack in her mother; Heart disease in her mother; Heart failure in her father; Stroke in her father. ? ?ROS:   ?Please see the history of present illness.    ?All other systems reviewed and are negative. ? ?EKGs/Labs/Other Studies Reviewed:   ? ?The following studies were reviewed today: ?Echo 05/30/2020: ? 1. Left  ventricular ejection fraction, by estimation, is 70 to 75%. The  ?left ventricle has hyperdynamic function. The left ventricle has no  ?regional wall motion abnormalities. There is mild left ventricular  ?hypertrophy. Left ventricular diastolic  ?parameters are consistent with Grade I diastolic dysfunction (impaired  ?relaxation). Elevated left atrial pressure.  ? 2. Right ventricular systolic function is normal. The right ventricular  ?size is normal. There is normal pulmonary artery systolic pressure.  ? 3. The mitral valve is normal in structure. Trivial mitral valve  ?regurgitation. No evidence of mitral  stenosis.  ? 4. The aortic valve has been repaired/replaced. Aortic valve  ?regurgitation is not visualized. No aortic stenosis is present. There is a  ?bioprosthetic valve present in the aortic position. Procedure Date:  ?05/2011. Echo findings are consistent with normal  ?structure and function of the aortic valve prosthesis. Aortic valve mean  ?gradient measures 15.0 mmHg. Aortic valve Vmax measures 2.42 m/s.  ? 5. The inferior vena cava is normal in size with greater than 50%  ?respiratory variability, suggesting right atrial pressure of 3 mmHg.  ? ?Comparison(s): Prior bioprosthetic aortic valve peak gradient 2018 - 2.3  ?m/s, mean 12 mmHg.  ? ?EKG:  EKG is ordered today.  The ekg ordered today demonstrates NSR with sinus arrhythmia, HR 65 bpm, LAFB. No change from last year's tracing ? ?Recent Labs: ?05/17/2021: ALT 50; BUN 18; Creatinine, Ser 0.85; Hemoglobin 15.6; Platelets 143.0; Potassium 4.3; Sodium 140; TSH 2.69  ?Recent Lipid Panel ?   ?Component Value Date/Time  ? CHOL 191 05/17/2021 0851  ? CHOL 185 11/11/2017 1114  ? TRIG 87.0 05/17/2021 0851  ? TRIG 58 05/13/2006 0933  ? HDL 52.30 05/17/2021 0851  ? HDL 54 11/11/2017 1114  ? CHOLHDL 4 05/17/2021 0851  ? VLDL 17.4 05/17/2021 0851  ? [place] 121 (H) 05/17/2021 3785  ? LDLCALC 111 (H) 11/11/2017 1114  ? LDLDIRECT 162.4 03/18/2012 0900  ? ? ? ?Risk  Assessment/Calculations:   ?  ? ?    ? ?Physical Exam:   ? ?VS:  BP 126/70   Pulse 65   Ht 5' 5.5" (1.664 m)   Wt 206 lb 6.4 oz (93.6 kg)   LMP 05/29/2007   SpO2 94%   BMI 33.82 kg/m?    ? ?Wt Readings fro

## 2021-09-27 DIAGNOSIS — L918 Other hypertrophic disorders of the skin: Secondary | ICD-10-CM | POA: Diagnosis not present

## 2021-09-27 DIAGNOSIS — L218 Other seborrheic dermatitis: Secondary | ICD-10-CM | POA: Diagnosis not present

## 2021-09-27 DIAGNOSIS — L720 Epidermal cyst: Secondary | ICD-10-CM | POA: Diagnosis not present

## 2021-09-27 DIAGNOSIS — L304 Erythema intertrigo: Secondary | ICD-10-CM | POA: Diagnosis not present

## 2021-09-27 DIAGNOSIS — I872 Venous insufficiency (chronic) (peripheral): Secondary | ICD-10-CM | POA: Diagnosis not present

## 2021-09-27 DIAGNOSIS — I8311 Varicose veins of right lower extremity with inflammation: Secondary | ICD-10-CM | POA: Diagnosis not present

## 2021-09-27 DIAGNOSIS — L821 Other seborrheic keratosis: Secondary | ICD-10-CM | POA: Diagnosis not present

## 2021-09-27 DIAGNOSIS — I8312 Varicose veins of left lower extremity with inflammation: Secondary | ICD-10-CM | POA: Diagnosis not present

## 2021-10-09 ENCOUNTER — Ambulatory Visit: Payer: Medicare Other | Admitting: Cardiovascular Disease

## 2021-10-19 ENCOUNTER — Ambulatory Visit: Payer: Medicare Other | Admitting: Cardiovascular Disease

## 2021-11-25 ENCOUNTER — Other Ambulatory Visit: Payer: Self-pay | Admitting: Cardiovascular Disease

## 2021-11-26 ENCOUNTER — Other Ambulatory Visit: Payer: Self-pay | Admitting: Cardiovascular Disease

## 2021-11-29 MED ORDER — POTASSIUM CHLORIDE CRYS ER 20 MEQ PO TBCR: 20.0000 meq | EXTENDED_RELEASE_TABLET | Freq: Every day | ORAL | 3 refills | Status: DC

## 2022-04-05 ENCOUNTER — Other Ambulatory Visit: Payer: Self-pay | Admitting: Family Medicine

## 2022-04-30 DIAGNOSIS — Z23 Encounter for immunization: Secondary | ICD-10-CM | POA: Diagnosis not present

## 2022-05-23 ENCOUNTER — Ambulatory Visit (INDEPENDENT_AMBULATORY_CARE_PROVIDER_SITE_OTHER): Payer: Medicare Other

## 2022-05-23 VITALS — Ht 65.5 in | Wt 206.0 lb

## 2022-05-23 DIAGNOSIS — Z0000 Encounter for general adult medical examination without abnormal findings: Secondary | ICD-10-CM

## 2022-05-23 NOTE — Progress Notes (Signed)
Subjective:   [name] is a 88 y.o. female who presents for Medicare Annual (Subsequent) preventive examination.  Review of Systems    No ROS.  Medicare Wellness Virtual Visit.  Visual/audio telehealth visit, UTA vital signs.   See social history for additional risk factors.   Cardiac Risk Factors include: advanced age (>23mn, >>16women)     Objective:    Today's Vitals   05/23/22 1533  Weight: 206 lb (93.4 kg)  Height: 5' 5.5" (1.664 m)   Body mass index is 33.76 kg/m.     05/23/2022    3:34 PM 05/17/2021    2:53 PM 12/23/2019   10:39 AM 11/19/2018   11:11 AM 12/20/2017   10:42 AM 11/11/2017   10:59 AM 03/06/2017    9:50 PM  Advanced Directives  Does Patient Have a Medical Advance Directive? Yes Yes No Yes Yes Yes No  Type of AIndustrial/product designerof AFrontier Oil CorporationPower of [place]  Does patient want to make changes to medical advance directive? No - Patient declined Yes (MAU/Ambulatory/Procedural Areas - Information given)       Copy of [place]? No - copy requested     No - copy requested   Would patient like information on creating a medical advance directive?   No - Patient declined        Current Medications (verified) Outpatient Encounter Medications as of 05/23/2022  Medication Sig   amoxicillin (AMOXIL) 500 MG capsule Take 2,000 mg by mouth daily. Take prior to dental   carvedilol (COREG) 25 MG tablet TAKE 1 TABLET (25 MG TOTAL) BY MOUTH TWICE A DAY WITH MEALS   Cholecalciferol (VITAMIN D) 1000 UNITS capsule Take 1 capsule (1,000 Units total) by mouth 2 (two) times daily.   diphenhydrAMINE (BENADRYL) 25 mg capsule Take 25 mg by mouth every 6 (six) hours as needed. Patient taking to help sleep   furosemide (LASIX) 20 MG tablet TAKE 1 TABLET BY MOUTH EVERY DAY   Omega-3 Fatty Acids (FISH OIL) 1000 MG CAPS Take 2,000 mg by mouth daily.   potassium  chloride SA (KLOR-CON M20) 20 MEQ tablet Take 1 tablet (20 mEq total) by mouth daily.   Psyllium (METAMUCIL PO) Take 1 Dose by mouth as directed.    spironolactone (ALDACTONE) 25 MG tablet Take 1 tablet (25 mg total) by mouth daily.   No facility-administered encounter medications on file as of 05/23/2022.    Allergies (verified) Aspirin, Calcium, Diovan [valsartan], Ezetimibe, Ramipril, and Simvastatin   History: Past Medical History:  Diagnosis Date   Alcohol abuse, unspecified    Aortic stenosis    s/p tissue AVR 05/2011   Arthritis    [place]ast [place]    rt   Diaphragmatic hernia without mention of obstruction or gangrene    Diverticulosis of colon (without mention of hemorrhage)    Dyskinesia of esophagus    Endometrial cancer ([facility])    Family history of colonic polyps    Heart murmur    HLD (hyperlipidemia)    HTN (hypertension)    Internal hemorrhoids without mention of complication    Malignant neoplasm of corpus uteri, except isthmus ([facility])    Other abnormal glucose    Pain in joint, shoulder region    Postmenopausal bleeding    Tobacco use disorder    Unspecified hearing loss    Vertigo    Past Surgical  History:  Procedure Laterality Date   AORTIC VALVE REPLACEMENT  06/13/2011   Procedure: AORTIC VALVE REPLACEMENT (AVR);  Surgeon: Gaye Pollack, MD;  Location: [facility];  Service: Open Heart Surgery;  Laterality: N/A;   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   BREAST LUMPECTOMY  11/2009   DCIS, neg sentinel lymph node biopsy   CATARACT EXTRACTION     bil   LAPAROSCOPY W/ TOTAL BILATERAL PELVIC AND PERI-AORTIC LYMPHADENECTOMY  11/04/2007   PARAAORTIC LYMPHADENECTOMY     ROTATOR CUFF REPAIR  05/2008   right   TOTAL ABDOMINAL HYSTERECTOMY W/ BILATERAL SALPINGOOPHORECTOMY  11/04/07   salpingo-oophrectomy   Family History  Problem Relation Age of Onset   Colon polyps Sister        2010   Heart disease Mother    Heart attack Mother    Stroke Father    Heart failure Father     Diabetes type II Father    Diabetes type II Brother    Cancer Maternal Aunt        possible post menopausal breast cancer   Social History   Socioeconomic History   Marital status: Widowed    Spouse name: Not on file   Number of children: 2   Years of education: Not on file   Highest education level: High school graduate  Occupational History   Not on file  Tobacco Use   Smoking status: Former    Packs/day: 1.00    Types: Cigarettes    Quit date: 03/28/2000    Years since quitting: 22.1   Smokeless tobacco: Never  Vaping Use   Vaping Use: Never used  Substance and Sexual Activity   Alcohol use: Not Currently    Alcohol/week: 0.0 standard drinks of alcohol   Drug use: Never   Sexual activity: Not Currently  Other Topics Concern   Not on file  Social History Narrative   Lives at home alone   Right handed   Caffeine: 1-2 cups daily   Social Determinants of Health   Financial Resource Strain: Low Risk  (05/23/2022)   Overall Financial Resource Strain (CARDIA)    Difficulty of Paying Living Expenses: Not hard at all  Food Insecurity: No Food Insecurity (05/23/2022)   Hunger Vital Sign    Worried About Running Out of Food in the Last Year: Never true    [place] in the Last Year: Never true  Transportation Needs: No Transportation Needs (05/23/2022)   PRAPARE - Hydrologist (Medical): No    Lack of Transportation (Non-Medical): No  Physical Activity: Inactive (05/17/2021)   Exercise Vital Sign    Days of Exercise per Week: 0 days    Minutes of Exercise per Session: 0 min  Stress: No Stress Concern Present (05/23/2022)   [place]    Feeling of Stress : Not at all  Social Connections: Socially Isolated (05/23/2022)   Social Connection and Isolation Panel [NHANES]    Frequency of Communication with Friends and Family: More than three times a week    Frequency of  Social Gatherings with Friends and Family: More than three times a week    Attends Religious Services: Never    Marine scientist or Organizations: No    Attends Archivist Meetings: Never    Marital Status: Widowed    Tobacco Counseling Counseling given: Not Answered   Clinical Intake:  Pre-visit preparation  completed: Yes        Diabetes: No  How often do you need to have someone help you when you read instructions, pamphlets, or other written materials from your doctor or pharmacy?: 1 - Never    Interpreter Needed?: No     Activities of Daily Living    05/23/2022    3:38 PM  In your present state of health, do you have any difficulty performing the following activities:  Hearing? 1  Comment Hearing aids  Vision? 0  Difficulty concentrating or making decisions? 0  Walking or climbing stairs? 1  Comment Cane/walker in use when ambulating  Dressing or bathing? 0  Doing errands, shopping? 0  Preparing Food and eating ? N  Using the Toilet? N  In the past six months, have you accidently leaked urine? Y  Comment Managed with a daily pad  Do you have problems with loss of bowel control? N  Managing your Medications? N  Managing your Finances? N  Housekeeping or managing your Housekeeping? N   Patient Care Team: Tower, Wynelle Fanny, MD as PCP - Cyndia Diver, MD as PCP - Cardiology (Cardiology) Gaye Pollack, MD (Cardiothoracic Surgery) Donnamae Jude, MD as Consulting Physician (Obstetrics and Gynecology) Jodi Marble, MD as Consulting Physician (Otolaryngology) Lia Hopping, DDS as Consulting Physician (Dentistry) Luberta Mutter, MD as Consulting Physician (Ophthalmology)  Indicate any recent Medical Services you may have received from other than [facility] providers in the past year (date may be approximate).     Assessment:   This is a routine wellness examination for Simon.  I connected with  MAELEIGH BUSCHMAN on 05/23/22 by a audio  enabled telemedicine application and verified that I am speaking with the correct person using two identifiers.  Patient Location: Home  Provider Location: Office/Clinic  I discussed the limitations of evaluation and management by telemedicine. The patient expressed understanding and agreed to proceed.   Hearing/Vision screen Hearing Screening - Comments:: Wears hearing aids Vision Screening - Comments:: Last exam 08/2020, [facility] Wears glasses  Dietary issues and exercise activities discussed: Current Exercise Habits: Home exercise routine, Intensity: Mild Regular diet    Goals Addressed             This Visit's Progress    Maintain healthy lifestyle       Stay active Stay hydrated Healthy diet       Depression Screen    05/23/2022    3:48 PM 05/17/2021    3:01 PM 12/23/2019   10:41 AM 11/19/2018   11:13 AM 12/20/2017   10:41 AM 11/11/2017   10:57 AM 11/07/2016   10:40 AM  PHQ 2/9 Scores  PHQ - 2 Score 0 0 0 0 0 0 0  PHQ- 9 Score   0 0  0     Fall Risk    05/23/2022    3:53 PM 05/17/2021    2:58 PM 12/23/2019   10:41 AM 11/19/2018   11:13 AM 12/20/2017   10:41 AM  Wells in the past year? 0 0 0 0 Yes  Number falls in past yr: 0 0 0  1  Injury with Fall? 0 0 0  No  Risk for fall due to : Impaired balance/gait Impaired balance/gait Medication side effect;Impaired balance/gait    Follow up Falls evaluation completed;Falls prevention discussed;Education provided Falls prevention discussed Falls evaluation completed;Falls prevention discussed      FALL RISK PREVENTION PERTAINING TO THE HOME:  Home free of loose throw rugs in walkways, pet beds, electrical cords, etc? Yes  Adequate lighting in your home to reduce risk of falls? Yes   ASSISTIVE DEVICES UTILIZED TO PREVENT FALLS: Life alert? No  Use of a cane, walker or w/c? Yes  Grab bars in the bathroom? Yes  Shower chair or bench in shower? No  Elevated toilet seat or a handicapped  toilet? Yes   TIMED UP AND GO: Was the test performed? No .   Cognitive Function:    12/23/2019   10:44 AM 11/19/2018   11:12 AM 11/11/2017   10:59 AM 11/07/2016   10:40 AM 09/15/2015    8:47 AM  MMSE - Mini Mental State Exam  Orientation to time '5 5 5 5 5  '$ Orientation to Place '5 5 5 5 5  '$ Registration '3 3 3 3 3  '$ Attention/ Calculation 5 0 0 0 0  Recall '3 3 3 3 3  '$ Language- name 2 objects  0 0 0 0  Language- repeat '1 1 1 1 1  '$ Language- follow 3 step command  0 '3 3 3  '$ Language- read & follow direction  0 0 0 0  Write a sentence  0 0 0 0  Copy design  0 0 0 0  Total score  '17 20 20 20        '$ 05/23/2022    3:51 PM  6CIT Screen  What Year? 0 points  What month? 0 points  What time? 0 points  Count back from 20 0 points  Months in reverse 0 points  Repeat phrase 0 points  Total Score 0 points    Immunizations Immunization History  Administered Date(s) Administered   Fluad Quad(high Dose 65+) 01/22/2019   Influenza Split 03/27/2011, 03/25/2012   Influenza Whole 03/07/2007, 02/17/2008, 03/08/2009, 03/20/2010   Influenza, High Dose Seasonal PF 02/03/2021, 05/10/2022   Influenza,inj,Quad PF,6+ Mos 03/06/2013, 02/23/2014, 02/24/2016   Influenza-Unspecified 02/27/2015, 01/19/2017, 12/26/2017   Pneumococcal Conjugate-13 01/15/2014   Pneumococcal Polysaccharide-23 03/08/2009   Td 05/29/1995, 03/08/2009   Tdap 05/19/2021   Zoster, Live 08/12/2007   Covid-19 vaccine status: Declined, Education has been provided regarding the importance of this vaccine but patient still declined. Advised may receive this vaccine at local pharmacy or Health Dept.or vaccine clinic. Aware to provide a copy of the vaccination record if obtained from local pharmacy or Health Dept. Verbalized acceptance and understanding.    Shingrix Completed?: No.    Education has been provided regarding the importance of this vaccine. Patient has been advised to call insurance company to determine out of pocket  expense if they have not yet received this vaccine. Advised may also receive vaccine at local pharmacy or Health Dept. Verbalized acceptance and understanding.  Screening Tests Health Maintenance  Topic Date Due   COVID-19 Vaccine (1) 06/08/2022 (Originally 04/18/1934)   Zoster Vaccines- Shingrix (1 of 2) 08/22/2022 (Originally 10/16/1985)   MAMMOGRAM  06/16/2022   Medicare Annual Wellness (AWV)  05/24/2023   DTaP/Tdap/Td (4 - Td or Tdap) 05/20/2031   Pneumonia Vaccine 38+ Years old  Completed   INFLUENZA VACCINE  Completed   DEXA SCAN  Completed   HPV VACCINES  Aged Out   Health Maintenance There are no preventive care reminders to display for this patient.  Lung Cancer Screening: (Low Dose CT Chest recommended if Age 68-80 years, 30 pack-year currently smoking OR have quit w/in 15years.) does not qualify.   Hepatitis C Screening: does not qualify.  Vision Screening:  Recommended annual ophthalmology exams for early detection of glaucoma and other disorders of the eye.  Dental Screening: Recommended annual dental exams for proper oral hygiene  Community Resource Referral / Chronic Care Management: CRR required this visit?  No   CCM required this visit?  No      Plan:     I have personally reviewed and noted the following in the patient's chart:   Medical and social history Use of alcohol, tobacco or illicit drugs  Current medications and supplements including opioid prescriptions. Patient is not currently taking opioid prescriptions. Functional ability and status Nutritional status Physical activity Advanced directives List of other physicians Hospitalizations, surgeries, and ER visits in previous 12 months Vitals Screenings to include cognitive, depression, and falls Referrals and appointments  In addition, I have reviewed and discussed with patient certain preventive protocols, quality metrics, and best practice recommendations. A written personalized care plan for  preventive services as well as general preventive health recommendations were provided to patient.     Leta Jungling, LPN   [id number]

## 2022-05-23 NOTE — Patient Instructions (Addendum)
Ms. Leibensperger , Thank you for taking time to come for your Medicare Wellness Visit. I appreciate your ongoing commitment to your health goals. Please review the following plan we discussed and let me know if I can assist you in the future.   These are the goals we discussed:  Goals Addressed             This Visit's Progress    Maintain healthy lifestyle       Stay active Stay hydrated Healthy diet         This is a list of the screening recommended for you and due dates:  Health Maintenance  Topic Date Due   COVID-19 Vaccine (1) 06/08/2022*   Zoster (Shingles) Vaccine (1 of 2) 08/22/2022*   Mammogram  06/16/2022   Medicare Annual Wellness Visit  05/24/2023   DTaP/Tdap/Td vaccine (4 - Td or Tdap) 05/20/2031   Pneumonia Vaccine  Completed   Flu Shot  Completed   DEXA scan (bone density measurement)  Completed   HPV Vaccine  Aged Out  *Topic was postponed. The date shown is not the original due date.    Advanced directives: End of life planning; Advance aging; Advanced directives discussed.  Copy of current HCPOA/Living Will requested.    Conditions/risks identified: none new  Next appointment: Follow up in one year for your annual wellness visit    Preventive Care 65 Years and Older, Female Preventive care refers to lifestyle choices and visits with your health care provider that can promote health and wellness. What does preventive care include? A yearly physical exam. This is also called an annual well check. Dental exams once or twice a year. Routine eye exams. Ask your health care provider how often you should have your eyes checked. Personal lifestyle choices, including: Daily care of your teeth and gums. Regular physical activity. Eating a healthy diet. Avoiding tobacco and drug use. Limiting alcohol use. Practicing safe sex. Taking low-dose aspirin every day. Taking vitamin and mineral supplements as recommended by your health care provider. What happens  during an annual well check? The services and screenings done by your health care provider during your annual well check will depend on your age, overall health, lifestyle risk factors, and family history of disease. Counseling  Your health care provider may ask you questions about your: Alcohol use. Tobacco use. Drug use. Emotional well-being. Home and relationship well-being. Sexual activity. Eating habits. History of falls. Memory and ability to understand (cognition). Work and work Statistician. Reproductive health. Screening  You may have the following tests or measurements: Height, weight, and BMI. Blood pressure. Lipid and cholesterol levels. These may be checked every 5 years, or more frequently if you are over 72 years old. Skin check. Lung cancer screening. You may have this screening every year starting at age 25 if you have a 30-pack-year history of smoking and currently smoke or have quit within the past 15 years. Fecal occult blood test (FOBT) of the stool. You may have this test every year starting at age 69. Flexible sigmoidoscopy or colonoscopy. You may have a sigmoidoscopy every 5 years or a colonoscopy every 10 years starting at age 24. Hepatitis C blood test. Hepatitis B blood test. Sexually transmitted disease (STD) testing. Diabetes screening. This is done by checking your blood sugar (glucose) after you have not eaten for a while (fasting). You may have this done every 1-3 years. Bone density scan. This is done to screen for osteoporosis. You may have this done  starting at age 62. Mammogram. This may be done every 1-2 years. Talk to your health care provider about how often you should have regular mammograms. Talk with your health care provider about your test results, treatment options, and if necessary, the need for more tests. Vaccines  Your health care provider may recommend certain vaccines, such as: Influenza vaccine. This is recommended every  year. Tetanus, diphtheria, and acellular pertussis (Tdap, Td) vaccine. You may need a Td booster every 10 years. Zoster vaccine. You may need this after age 54. Pneumococcal 13-valent conjugate (PCV13) vaccine. One dose is recommended after age 102. Pneumococcal polysaccharide (PPSV23) vaccine. One dose is recommended after age 2. Talk to your health care provider about which screenings and vaccines you need and how often you need them. This information is not intended to replace advice given to you by your health care provider. Make sure you discuss any questions you have with your health care provider. Document Released: 06/10/2015 Document Revised: 02/01/2016 Document Reviewed: 03/15/2015 Elsevier Interactive Patient Education  2017 [place] Prevention in the Home Falls can cause injuries. They can happen to people of all ages. There are many things you can do to make your home safe and to help prevent falls. What can I do on the outside of my home? Regularly fix the edges of walkways and driveways and fix any cracks. Remove anything that might make you trip as you walk through a door, such as a raised step or threshold. Trim any bushes or trees on the path to your home. Use bright outdoor lighting. Clear any walking paths of anything that might make someone trip, such as rocks or tools. Regularly check to see if handrails are loose or broken. Make sure that both sides of any steps have handrails. Any raised decks and porches should have guardrails on the edges. Have any leaves, snow, or ice cleared regularly. Use sand or salt on walking paths during winter. Clean up any spills in your garage right away. This includes oil or grease spills. What can I do in the bathroom? Use night lights. Install grab bars by the toilet and in the tub and shower. Do not use towel bars as grab bars. Use non-skid mats or decals in the tub or shower. If you need to sit down in the shower, use a  plastic, non-slip stool. Keep the floor dry. Clean up any water that spills on the floor as soon as it happens. Remove soap buildup in the tub or shower regularly. Attach bath mats securely with double-sided non-slip rug tape. Do not have throw rugs and other things on the floor that can make you trip. What can I do in the bedroom? Use night lights. Make sure that you have a light by your bed that is easy to reach. Do not use any sheets or blankets that are too big for your bed. They should not hang down onto the floor. Have a firm chair that has side arms. You can use this for support while you get dressed. Do not have throw rugs and other things on the floor that can make you trip. What can I do in the kitchen? Clean up any spills right away. Avoid walking on wet floors. Keep items that you use a lot in easy-to-reach places. If you need to reach something above you, use a strong step stool that has a grab bar. Keep electrical cords out of the way. Do not use floor polish or wax that  makes floors slippery. If you must use wax, use non-skid floor wax. Do not have throw rugs and other things on the floor that can make you trip. What can I do with my stairs? Do not leave any items on the stairs. Make sure that there are handrails on both sides of the stairs and use them. Fix handrails that are broken or loose. Make sure that handrails are as long as the stairways. Check any carpeting to make sure that it is firmly attached to the stairs. Fix any carpet that is loose or worn. Avoid having throw rugs at the top or bottom of the stairs. If you do have throw rugs, attach them to the floor with carpet tape. Make sure that you have a light switch at the top of the stairs and the bottom of the stairs. If you do not have them, ask someone to add them for you. What else can I do to help prevent falls? Wear shoes that: Do not have high heels. Have rubber bottoms. Are comfortable and fit you  well. Are closed at the toe. Do not wear sandals. If you use a stepladder: Make sure that it is fully opened. Do not climb a closed stepladder. Make sure that both sides of the stepladder are locked into place. Ask someone to hold it for you, if possible. Clearly mark and make sure that you can see: Any grab bars or handrails. First and last steps. Where the edge of each step is. Use tools that help you move around (mobility aids) if they are needed. These include: Canes. Walkers. Scooters. Crutches. Turn on the lights when you go into a dark area. Replace any light bulbs as soon as they burn out. Set up your furniture so you have a clear path. Avoid moving your furniture around. If any of your floors are uneven, fix them. If there are any pets around you, be aware of where they are. Review your medicines with your doctor. Some medicines can make you feel dizzy. This can increase your chance of falling. Ask your doctor what other things that you can do to help prevent falls. This information is not intended to replace advice given to you by your health care provider. Make sure you discuss any questions you have with your health care provider. Document Released: 03/10/2009 Document Revised: 10/20/2015 Document Reviewed: 06/18/2014 Elsevier Interactive Patient Education  2017 [employer].

## 2022-06-10 ENCOUNTER — Telehealth: Payer: Self-pay | Admitting: Family Medicine

## 2022-06-10 DIAGNOSIS — R7303 Prediabetes: Secondary | ICD-10-CM

## 2022-06-10 DIAGNOSIS — E7800 Pure hypercholesterolemia, unspecified: Secondary | ICD-10-CM

## 2022-06-10 DIAGNOSIS — I10 Essential (primary) hypertension: Secondary | ICD-10-CM

## 2022-06-10 DIAGNOSIS — E559 Vitamin D deficiency, unspecified: Secondary | ICD-10-CM

## 2022-06-10 NOTE — Telephone Encounter (Signed)
-----  Message from Velna Hatchet, RT sent at 05/30/2022  8:54 AM EST ----- Regarding: Tue 1/16 lab Future lab orders needed for AWV visit, please.  Thanks, Anda Kraft

## 2022-06-12 ENCOUNTER — Other Ambulatory Visit (INDEPENDENT_AMBULATORY_CARE_PROVIDER_SITE_OTHER): Payer: Medicare Other

## 2022-06-12 DIAGNOSIS — I10 Essential (primary) hypertension: Secondary | ICD-10-CM | POA: Diagnosis not present

## 2022-06-12 DIAGNOSIS — E559 Vitamin D deficiency, unspecified: Secondary | ICD-10-CM | POA: Diagnosis not present

## 2022-06-12 DIAGNOSIS — R7303 Prediabetes: Secondary | ICD-10-CM

## 2022-06-12 DIAGNOSIS — E7800 Pure hypercholesterolemia, unspecified: Secondary | ICD-10-CM

## 2022-06-12 LAB — LIPID PANEL
Cholesterol: 193 mg/dL (ref 0–200)
HDL: 52.4 mg/dL (ref >39.00)
LDL Cholesterol: 125 mg/dL — ABNORMAL HIGH (ref 0–99)
NonHDL: 140.87
Total CHOL/HDL Ratio: 4
Triglycerides: 81 mg/dL (ref 0.0–149.0)
VLDL: 16.2 mg/dL (ref 0.0–40.0)

## 2022-06-12 LAB — COMPREHENSIVE METABOLIC PANEL
ALT: 25 U/L (ref 0–35)
AST: 23 U/L (ref 0–37)
Albumin: 4.5 g/dL (ref 3.5–5.2)
Alkaline Phosphatase: 56 U/L (ref 39–117)
BUN: 23 mg/dL (ref 6–23)
CO2: 33 mEq/L — ABNORMAL HIGH (ref 19–32)
Calcium: 10 mg/dL (ref 8.4–10.5)
Chloride: 101 mEq/L (ref 96–112)
Creatinine, Ser: 0.8 mg/dL (ref 0.40–1.20)
GFR: 66.61 mL/min (ref >60.00)
Glucose, Bld: 106 mg/dL — ABNORMAL HIGH (ref 70–99)
Potassium: 4.1 mEq/L (ref 3.5–5.1)
Sodium: 141 mEq/L (ref 135–145)
Total Bilirubin: 0.8 mg/dL (ref 0.2–1.2)
Total Protein: 6.6 g/dL (ref 6.0–8.3)

## 2022-06-12 LAB — HEMOGLOBIN A1C: Hgb A1c MFr Bld: 5.7 % (ref 4.6–6.5)

## 2022-06-12 LAB — CBC WITH DIFFERENTIAL/PLATELET
Basophils Absolute: 0 10*3/uL (ref 0.0–0.1)
Basophils Relative: 0.6 % (ref 0.0–3.0)
Eosinophils Absolute: 0.2 10*3/uL (ref 0.0–0.7)
Eosinophils Relative: 3 % (ref 0.0–5.0)
HCT: 45.3 % (ref 36.0–46.0)
Hemoglobin: 15.4 g/dL — ABNORMAL HIGH (ref 12.0–15.0)
Lymphocytes Relative: 23.4 % (ref 12.0–46.0)
Lymphs Abs: 1.2 10*3/uL (ref 0.7–4.0)
MCHC: 34.1 g/dL (ref 30.0–36.0)
MCV: 87.8 fl (ref 78.0–100.0)
Monocytes Absolute: 0.4 10*3/uL (ref 0.1–1.0)
Monocytes Relative: 7.7 % (ref 3.0–12.0)
Neutro Abs: 3.5 10*3/uL (ref 1.4–7.7)
Neutrophils Relative %: 65.3 % (ref 43.0–77.0)
Platelets: 137 10*3/uL — ABNORMAL LOW (ref 150.0–400.0)
RBC: 5.16 Mil/uL — ABNORMAL HIGH (ref 3.87–5.11)
RDW: 13.8 % (ref 11.5–15.5)
WBC: 5.3 10*3/uL (ref 4.0–10.5)

## 2022-06-12 LAB — TSH: TSH: 2.04 u[IU]/mL (ref 0.35–5.50)

## 2022-06-12 LAB — VITAMIN D 25 HYDROXY (VIT D DEFICIENCY, FRACTURES): VITD: 50.93 ng/mL (ref 30.00–100.00)

## 2022-06-19 ENCOUNTER — Ambulatory Visit (INDEPENDENT_AMBULATORY_CARE_PROVIDER_SITE_OTHER): Payer: Medicare Other | Admitting: Family Medicine

## 2022-06-19 ENCOUNTER — Encounter: Payer: Self-pay | Admitting: Family Medicine

## 2022-06-19 VITALS — BP 131/80 | HR 67 | Temp 97.5°F | Ht 64.5 in | Wt 197.2 lb

## 2022-06-19 DIAGNOSIS — E559 Vitamin D deficiency, unspecified: Secondary | ICD-10-CM

## 2022-06-19 DIAGNOSIS — R7303 Prediabetes: Secondary | ICD-10-CM | POA: Diagnosis not present

## 2022-06-19 DIAGNOSIS — E7800 Pure hypercholesterolemia, unspecified: Secondary | ICD-10-CM

## 2022-06-19 DIAGNOSIS — I10 Essential (primary) hypertension: Secondary | ICD-10-CM

## 2022-06-19 DIAGNOSIS — M25512 Pain in left shoulder: Secondary | ICD-10-CM

## 2022-06-19 NOTE — Patient Instructions (Addendum)
Get in touch with your orthopedic doctor about the shoulder   Call and schedule your mammogram at the [facility]   Wear whatever support hose or socks are comfortable for you   Make sure you get protein with every meal  Meat Fish  Dairy products Eggs  Beans or soy beans   For memory  Socialize Keep your brain active  Read/do puzzles   Labs are stable / ok  For cholesterol Avoid red meat/ fried foods/ egg yolks/ fatty breakfast meats/ butter, cheese and high fat dairy/ and shellfish    Take care of yourself

## 2022-06-19 NOTE — Assessment & Plan Note (Signed)
Disc goals for lipids and reasons to control them Rev last labs with pt Rev low sat fat diet in detail  LDL 125 Stable Takes fish oil Diet controlled

## 2022-06-19 NOTE — Assessment & Plan Note (Signed)
Lab Results  Component Value Date   HGBA1C 5.7 06/12/2022   This is improved with lower glycemic diet commended

## 2022-06-19 NOTE — Assessment & Plan Note (Signed)
Vitamin D level is therapeutic with current supplementation Disc importance of this to bone and overall health  Levelo f50.9

## 2022-06-19 NOTE — Assessment & Plan Note (Signed)
  bp in fair control at this time  BP Readings from Last 1 Encounters:  06/19/22 131/80   No changes needed Most recent labs reviewed  Disc lifstyle change with low sodium diet and exercise  Plan to continue Spironolactone 25 mg daily  Lasix 20 mg daily  Coreg 25 mg bid  K level is normal

## 2022-06-19 NOTE — Progress Notes (Signed)
Subjective:    Patient ID: [name], female    DOB: August 14, 1933, 89 y.o.   MRN: [id number]  HPI Pt presents for annual f/u of chronic medical problems   Wt Readings from Last 3 Encounters:  06/19/22 197 lb 4 oz (89.5 kg)  05/23/22 206 lb (93.4 kg)  08/29/21 206 lb 6.4 oz (93.6 kg)   33.33 kg/m  Feeling fair  Lost weight and that made her feel better (intentional slow wt loss)  Cut back on some junk  Eats a lot of fruit and vegetables  Given her age / ups and downs   More protein  Less meat Eats beans  Eggs/bacon for bkfast - very good about protein    Having shoulder problems - depends on movement  Her L hand is sensitive to touch- when it comes it goes away very quickly   Her right foot stays cold feeling (is not cold to the touch)  Not tingly or burning  Has baseline pedal edema and she wears compression hose (she wears some knee highs but cannot get waist high ones on) They are uncomfortable at times     Immunization History  Administered Date(s) Administered   Fluad Quad(high Dose 65+) 01/22/2019   Influenza Split 03/27/2011, 03/25/2012   Influenza Whole 03/07/2007, 02/17/2008, 03/08/2009, 03/20/2010   Influenza, High Dose Seasonal PF 02/03/2021, 05/10/2022   Influenza,inj,Quad PF,6+ Mos 03/06/2013, 02/23/2014, 02/24/2016   Influenza-Unspecified 02/27/2015, 01/19/2017, 12/26/2017   Pneumococcal Conjugate-13 01/15/2014   Pneumococcal Polysaccharide-23 03/08/2009   Td 05/29/1995, 03/08/2009   Tdap 05/19/2021   Zoster, Live 08/12/2007   Health Maintenance Due  Topic Date Due   MAMMOGRAM  06/16/2022   Mammogram 05/2021 - due for and she got reminder letter  Self breast exam: no lumps   Dexa 05/2021  bmd in the normal range  Falls: none , uses a walker and is very cautious  Fractures:  Supplements : vit D bid  D level is 50.9  Exercise : limited / uses a walker  A balanced diet   Med POA is up to date and she brought it today  Notes more memory  issues with age (short term) like names and they come back to her later  Not confused Not lost  Still drives    Has hearing aides  They are new- not working this am / is aggravated by that so wearing old one today Going back to audiology   HTN- in setting of CHF/ DD seen by cardiology  bp is stable today  No cp or palpitations or headaches or edema  No side effects to medicines  BP Readings from Last 3 Encounters:  06/19/22 131/80  08/29/21 126/70  05/25/21 128/72    Coreg 25 mg bid  Lasix 20 mg daily  Takes K Spironolactone 25 mg daily   Lab Results  Component Value Date   CREATININE 0.80 06/12/2022   BUN 23 06/12/2022   NA 141 06/12/2022   K 4.1 06/12/2022   CL 101 06/12/2022   CO2 33 (H) 06/12/2022   K level is ok/not high or low   Hyperlipidemia Lab Results  Component Value Date   CHOL 193 06/12/2022   CHOL 191 05/17/2021   CHOL 177 12/17/2019   Lab Results  Component Value Date   HDL 52.40 06/12/2022   HDL 52.30 05/17/2021   HDL 50.90 12/17/2019   Lab Results  Component Value Date   LDLCALC 125 (H) 06/12/2022   [place] 121 (H) 05/17/2021  LDLCALC 109 (H) 12/17/2019   Lab Results  Component Value Date   TRIG 81.0 06/12/2022   TRIG 87.0 05/17/2021   TRIG 88.0 12/17/2019   Lab Results  Component Value Date   CHOLHDL 4 06/12/2022   CHOLHDL 4 05/17/2021   CHOLHDL 3 12/17/2019   Lab Results  Component Value Date   LDLDIRECT 162.4 03/18/2012   LDLDIRECT 147.6 03/16/2010   Diet controlled Takes fish oil  Fairly stable   Prediabetes Lab Results  Component Value Date   HGBA1C 5.7 06/12/2022   This is down from 6.4  She cut the sugar   Mildly low platelets baseline Lab Results  Component Value Date   WBC 5.3 06/12/2022   HGB 15.4 (H) 06/12/2022   HCT 45.3 06/12/2022   MCV 87.8 06/12/2022   PLT 137.0 (L) 06/12/2022   Other labs Lab Results  Component Value Date   ALT 25 06/12/2022   AST 23 06/12/2022   ALKPHOS 56 06/12/2022    BILITOT 0.8 06/12/2022    Lab Results  Component Value Date   TSH 2.04 06/12/2022    Patient Active Problem List   Diagnosis Date Noted   Left shoulder pain 06/19/2022   Estrogen deficiency 05/25/2021   Chronic diastolic heart failure ([place]) 12/01/2018   Episodic lightheadedness 03/22/2017   Urge incontinence 10/15/2016   Nonallopathic lesion of lumbar region 03/28/2015   Nonallopathic lesion of thoracic region 03/28/2015   Encounter for Medicare annual wellness exam 01/15/2014   Risk for falls 03/25/2012   Vitamin D deficiency 08/29/2009   DEPENDENT EDEMA, LEGS 09/07/2008   CONSTIPATION 07/30/2008   Prediabetes 07/09/2008   History of endometrial cancer 10/28/2007   HEARING LOSS, LEFT EAR 10/15/2007   HEMORRHOIDS, INTERNAL 10/15/2007   EXTERNAL HEMORRHOIDS 10/15/2007   ESOPHAGEAL SPASM 10/15/2007   DIVERTICULOSIS, COLON 10/15/2007   Hyperlipidemia 10/07/2007   Essential hypertension 10/07/2007   AORTIC STENOSIS 10/26/2004   HIATAL HERNIA 12/27/1995   Past Medical History:  Diagnosis Date   Alcohol abuse, unspecified    Aortic stenosis    s/p tissue AVR 05/2011   Arthritis    Breast CA ([place])    rt   Diaphragmatic hernia without mention of obstruction or gangrene    Diverticulosis of colon (without mention of hemorrhage)    Dyskinesia of esophagus    Endometrial cancer ([place])    Family history of colonic polyps    Heart murmur    HLD (hyperlipidemia)    HTN (hypertension)    Internal hemorrhoids without mention of complication    Malignant neoplasm of corpus uteri, except isthmus ([facility])    Other abnormal glucose    Pain in joint, shoulder region    Postmenopausal bleeding    Tobacco use disorder    Unspecified hearing loss    Vertigo    Past Surgical History:  Procedure Laterality Date   AORTIC VALVE REPLACEMENT  06/13/2011   Procedure: AORTIC VALVE REPLACEMENT (AVR);  Surgeon: Gaye Pollack, MD;  Location: [facility];  Service: Open Heart Surgery;  Laterality:  N/A;   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   BREAST LUMPECTOMY  11/2009   DCIS, neg sentinel lymph node biopsy   CATARACT EXTRACTION     bil   LAPAROSCOPY W/ TOTAL BILATERAL PELVIC AND PERI-AORTIC LYMPHADENECTOMY  11/04/2007   PARAAORTIC LYMPHADENECTOMY     ROTATOR CUFF REPAIR  05/2008   right   TOTAL ABDOMINAL HYSTERECTOMY W/ BILATERAL SALPINGOOPHORECTOMY  11/04/07   salpingo-oophrectomy   Social  History   Tobacco Use   Smoking status: Former    Packs/day: 1.00    Types: Cigarettes    Quit date: 03/28/2000    Years since quitting: 22.2   Smokeless tobacco: Never  Vaping Use   Vaping Use: Never used  Substance Use Topics   Alcohol use: Not Currently    Alcohol/week: 0.0 standard drinks of alcohol   Drug use: Never   Family History  Problem Relation Age of Onset   Colon polyps Sister        2010   Heart disease Mother    Heart attack Mother    Stroke Father    Heart failure Father    Diabetes type II Father    Diabetes type II Brother    Cancer Maternal Aunt        possible post menopausal breast cancer   Allergies  Allergen Reactions   Aspirin Other (See Comments)    dizzy   Calcium     REACTION: constipation   Diovan [Valsartan]     REACTION: abdominal pain   Ezetimibe     REACTION: fatigue   Ramipril     REACTION: abdominal pain   Simvastatin     REACTION: leg pain and body aches   Current Outpatient Medications on File Prior to Visit  Medication Sig Dispense Refill   amoxicillin (AMOXIL) 500 MG capsule Take 2,000 mg by mouth daily. Take prior to dental     carvedilol (COREG) 25 MG tablet TAKE 1 TABLET (25 MG TOTAL) BY MOUTH TWICE A DAY WITH MEALS 180 tablet 0   Cholecalciferol (VITAMIN D) 1000 UNITS capsule Take 1 capsule (1,000 Units total) by mouth 2 (two) times daily. 180 capsule 3   diphenhydrAMINE (BENADRYL) 25 mg capsule Take 25 mg by mouth every 6 (six) hours as needed. Patient taking to help sleep     furosemide (LASIX) 20 MG tablet TAKE 1 TABLET BY MOUTH  EVERY DAY 90 tablet 3   Omega-3 Fatty Acids (FISH OIL) 1000 MG CAPS Take 2,000 mg by mouth daily.     potassium chloride SA (KLOR-CON M20) 20 MEQ tablet Take 1 tablet (20 mEq total) by mouth daily. 90 tablet 3   Psyllium (METAMUCIL PO) Take 1 Dose by mouth as directed.      spironolactone (ALDACTONE) 25 MG tablet Take 1 tablet (25 mg total) by mouth daily. 90 tablet 3   No current facility-administered medications on file prior to visit.     Review of Systems  Constitutional:  Negative for activity change, appetite change, fatigue, fever and unexpected weight change.  HENT:  Negative for congestion, ear pain, rhinorrhea, sinus pressure and sore throat.   Eyes:  Negative for pain, redness and visual disturbance.  Respiratory:  Negative for cough, shortness of breath and wheezing.   Cardiovascular:  Positive for leg swelling. Negative for chest pain and palpitations.  Gastrointestinal:  Negative for abdominal pain, blood in stool, constipation and diarrhea.  Endocrine: Negative for polydipsia and polyuria.  Genitourinary:  Negative for dysuria, frequency and urgency.  Musculoskeletal:  Positive for arthralgias and back pain. Negative for myalgias.  Skin:  Negative for pallor and rash.  Allergic/Immunologic: Negative for environmental allergies.  Neurological:  Negative for dizziness, syncope and headaches.  Hematological:  Negative for adenopathy. Does not bruise/bleed easily.  Psychiatric/Behavioral:  Negative for decreased concentration and dysphoric mood. The patient is not nervous/anxious.        Objective:   Physical Exam Constitutional:  General: She is not in acute distress.    Appearance: Normal appearance. She is well-developed. She is obese. She is not ill-appearing or diaphoretic.  HENT:     Head: Normocephalic and atraumatic.     Right Ear: Tympanic membrane, ear canal and external ear normal.     Left Ear: Tympanic membrane, ear canal and external ear normal.      Nose: Nose normal. No congestion.     Mouth/Throat:     Mouth: Mucous membranes are moist.     Pharynx: Oropharynx is clear. No posterior oropharyngeal erythema.  Eyes:     General: No scleral icterus.    Extraocular Movements: Extraocular movements intact.     Conjunctiva/sclera: Conjunctivae normal.     Pupils: Pupils are equal, round, and reactive to light.  Neck:     Thyroid: No thyromegaly.     Vascular: No carotid bruit or JVD.  Cardiovascular:     Rate and Rhythm: Normal rate and regular rhythm.     Pulses: Normal pulses.     Heart sounds: Normal heart sounds.     No gallop.  Pulmonary:     Effort: Pulmonary effort is normal. No respiratory distress.     Breath sounds: Normal breath sounds. No wheezing.     Comments: Good air exch Chest:     Chest wall: No tenderness.  Abdominal:     General: Bowel sounds are normal. There is no distension or abdominal bruit.     Palpations: Abdomen is soft. There is no mass.     Tenderness: There is no abdominal tenderness.     Hernia: No hernia is present.  Genitourinary:    Comments: Breast exam: No nodules, thickening, tenderness, bulging, retraction, inflamation, nipple discharge or skin changes noted.  No axillary or clavicular LA.     Baseline M from scar tissue r lateral/upper breast  Musculoskeletal:        General: No tenderness. Normal range of motion.     Cervical back: Normal range of motion and neck supple. No rigidity. No muscular tenderness.     Right lower leg: No edema.     Left lower leg: No edema.     Comments: No kyphosis   Lymphadenopathy:     Cervical: No cervical adenopathy.  Skin:    General: Skin is warm and dry.     Coloration: Skin is not pale.     Findings: No erythema or rash.     Comments: Solar lentigines diffusely Many sks  Neurological:     Mental Status: She is alert. Mental status is at baseline.     Cranial Nerves: No cranial nerve deficit.     Motor: No abnormal muscle tone.      Coordination: Coordination normal.     Gait: Gait normal.     Deep Tendon Reflexes: Reflexes are normal and symmetric. Reflexes normal.  Psychiatric:        Mood and Affect: Mood normal.        Cognition and Memory: Cognition and memory normal.           Assessment & Plan:   Problem List Items Addressed This Visit       Cardiovascular and Mediastinum   Essential hypertension - Primary     bp in fair control at this time  BP Readings from Last 1 Encounters:  06/19/22 131/80  No changes needed Most recent labs reviewed  Disc lifstyle change with low sodium diet and exercise  Plan  to continue Spironolactone 25 mg daily  Lasix 20 mg daily  Coreg 25 mg bid  K level is normal        Other   Hyperlipidemia    Disc goals for lipids and reasons to control them Rev last labs with pt Rev low sat fat diet in detail  LDL 125 Stable Takes fish oil Diet controlled      Left shoulder pain    Some limited rom Enc to plan ortho f/u      Prediabetes    Lab Results  Component Value Date   HGBA1C 5.7 06/12/2022  This is improved with lower glycemic diet commended      Vitamin D deficiency    Vitamin D level is therapeutic with current supplementation Disc importance of this to bone and overall health  Levelo f50.9

## 2022-06-19 NOTE — Assessment & Plan Note (Signed)
Some limited rom Enc to plan ortho f/u

## 2022-07-27 ENCOUNTER — Other Ambulatory Visit: Payer: Self-pay | Admitting: Cardiovascular Disease

## 2022-07-27 DIAGNOSIS — I359 Nonrheumatic aortic valve disorder, unspecified: Secondary | ICD-10-CM

## 2022-07-27 NOTE — Progress Notes (Signed)
Per April 2023 OV note:  Patient's status post bioprosthetic aortic valve replacement with normal function of her aortic prosthesis on last year's echo study. She is doing much better this year with some improvement in her energy level and exertional dyspnea. She attributes this to changes in her diet and weight loss. Her weight is down about 13 pounds. Her exam remains stable with a benign outflow murmur, no diastolic murmur, and no other significant changes. I have recommended a follow-up visit in 1 year with an echocardiogram prior to that visit.   Edmon Crape (ECHO scheduler) reached out asking that we place a new order d/t EPIC not letting her link pt's appt to this order. Old order discontinued and new one placed at this time.

## 2022-08-24 ENCOUNTER — Other Ambulatory Visit: Payer: Self-pay | Admitting: Family Medicine

## 2022-09-06 ENCOUNTER — Ambulatory Visit (HOSPITAL_COMMUNITY): Payer: Medicare Other | Attending: Internal Medicine

## 2022-09-06 ENCOUNTER — Encounter: Payer: Self-pay | Admitting: Cardiovascular Disease

## 2022-09-06 ENCOUNTER — Ambulatory Visit (INDEPENDENT_AMBULATORY_CARE_PROVIDER_SITE_OTHER): Payer: Medicare Other | Admitting: Cardiovascular Disease

## 2022-09-06 VITALS — BP 140/58 | HR 57 | Ht 65.0 in | Wt 185.0 lb

## 2022-09-06 DIAGNOSIS — I5032 Chronic diastolic (congestive) heart failure: Secondary | ICD-10-CM

## 2022-09-06 DIAGNOSIS — I359 Nonrheumatic aortic valve disorder, unspecified: Secondary | ICD-10-CM | POA: Diagnosis not present

## 2022-09-06 DIAGNOSIS — I10 Essential (primary) hypertension: Secondary | ICD-10-CM | POA: Diagnosis not present

## 2022-09-06 LAB — ECHOCARDIOGRAM COMPLETE
AR max vel: 1.25 cm2
AV Area VTI: 1.39 cm2
AV Area mean vel: 1.32 cm2
AV Mean grad: 17.7 mmHg
AV Peak grad: 31.7 mmHg
Ao pk vel: 2.81 m/s
Area-P 1/2: 2.84 cm2
S' Lateral: 2.9 cm

## 2022-09-06 NOTE — Patient Instructions (Signed)
Medication Instructions:  Your physician recommends that you continue on your current medications as directed. Please refer to the Current Medication list given to you today.  *If you need a refill on your cardiac medications before your next appointment, please call your pharmacy*   Lab Work: NONE If you have labs (blood work) drawn today and your tests are completely normal, you will receive your results only by: MyChart Message (if you have MyChart) OR A paper copy in the mail If you have any lab test that is abnormal or we need to change your treatment, we will call you to review the results.   Testing/Procedures: ECHO (in 1 year, prior to appt) Your physician has requested that you have an echocardiogram. Echocardiography is a painless test that uses sound waves to create images of your heart. It provides your doctor with information about the size and shape of your heart and how well your heart's chambers and valves are working. This procedure takes approximately one hour. There are no restrictions for this procedure. Please do NOT wear cologne, perfume, aftershave, or lotions (deodorant is allowed). Please arrive 15 minutes prior to your appointment time.  Follow-Up: At [facility], you and your health needs are our priority.  As part of our continuing mission to provide you with exceptional heart care, we have created designated Provider Care Teams.  These Care Teams include your primary Cardiologist (physician) and Advanced Practice Providers (APPs -  Physician Assistants and Nurse Practitioners) who all work together to provide you with the care you need, when you need it.  Your next appointment:   1 year(s)  Provider:   [name], MD

## 2022-09-06 NOTE — Progress Notes (Signed)
Cardiology Office Note:    Date:  09/06/2022   ID:  [name], DOB Oct 13, 1933, MRN [id number]  PCP:  Judy Pimple, MD   [facility] Providers Cardiologist:  [name], MD     Referring MD: Judy Pimple, MD   Chief Complaint  Patient presents with   Follow-up    S/P aortic valve replacement    History of Present Illness:    [name] is a 89 y.o. female with a hx of: Aortic stenosis Status post bioprosthetic AVR 05/2011 Pt DC'd ASA 2/2 to dizziness  Chronic diastolic CHF Breast cancer Endometrial cancer Hypertension Hyperlipidemia Leg edema  The patient is here alone today. She feels fortunate to remain independent, still driving and living independently. Her grown children are supportive. She reports poor sleep, with difficulty sleeping more that 3-4 hours at one time.  She has longstanding leg swelling and reports no significant change.  When she wears compression stockings, the leg swelling improves, but it returns as soon as she takes them off.  She denies orthopnea or PND.  She has no chest pain or pressure.  Denies any major functional limitation.  Reports mild shortness of breath with exertion is unchanged over time.    Past Medical History:  Diagnosis Date   Alcohol abuse, unspecified    Aortic stenosis    s/p tissue AVR 05/2011   Arthritis    Breast CA    rt   Diaphragmatic hernia without mention of obstruction or gangrene    Diverticulosis of colon (without mention of hemorrhage)    Dyskinesia of esophagus    Endometrial cancer    Family history of colonic polyps    Heart murmur    HLD (hyperlipidemia)    HTN (hypertension)    Internal hemorrhoids without mention of complication    Malignant neoplasm of corpus uteri, except isthmus    Other abnormal glucose    Pain in joint, shoulder region    Postmenopausal bleeding    Tobacco use disorder    Unspecified hearing loss    Vertigo     Past Surgical History:  Procedure  Laterality Date   AORTIC VALVE REPLACEMENT  06/13/2011   Procedure: AORTIC VALVE REPLACEMENT (AVR);  Surgeon: [name], MD;  Location: [facility] OR;  Service: Open Heart Surgery;  Laterality: N/A;   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   BREAST LUMPECTOMY  11/2009   DCIS, neg sentinel lymph node biopsy   CATARACT EXTRACTION     bil   LAPAROSCOPY W/ TOTAL BILATERAL PELVIC AND PERI-AORTIC LYMPHADENECTOMY  11/04/2007   PARAAORTIC LYMPHADENECTOMY     ROTATOR CUFF REPAIR  05/2008   right   TOTAL ABDOMINAL HYSTERECTOMY W/ BILATERAL SALPINGOOPHORECTOMY  11/04/07   salpingo-oophrectomy    Current Medications: Current Meds  Medication Sig   amoxicillin (AMOXIL) 500 MG capsule Take 2,000 mg by mouth daily. Take prior to dental   carvedilol (COREG) 25 MG tablet TAKE 1 TABLET BY MOUTH TWICE A DAY WITH MEALS   Cholecalciferol (VITAMIN D) 1000 UNITS capsule Take 1 capsule (1,000 Units total) by mouth 2 (two) times daily.   diphenhydrAMINE (BENADRYL) 25 mg capsule Take 25 mg by mouth every 6 (six) hours as needed. Patient taking to help sleep   furosemide (LASIX) 20 MG tablet TAKE 1 TABLET BY MOUTH EVERY DAY   Omega-3 Fatty Acids (FISH OIL) 1000 MG CAPS Take 2,000 mg by mouth daily.   potassium chloride SA (KLOR-CON M20)  20 MEQ tablet Take 1 tablet (20 mEq total) by mouth daily.   Psyllium (METAMUCIL PO) Take 1 Dose by mouth as directed.    spironolactone (ALDACTONE) 25 MG tablet Take 1 tablet (25 mg total) by mouth daily.     Allergies:   Aspirin, Calcium, Diovan [valsartan], Ezetimibe, Ramipril, and Simvastatin   Social History   Socioeconomic History   Marital status: Widowed    Spouse name: Not on file   Number of children: 2   Years of education: Not on file   Highest education level: High school graduate  Occupational History   Not on file  Tobacco Use   Smoking status: Former    Packs/day: 1    Types: Cigarettes    Quit date: 03/28/2000    Years since quitting: 22.4   Smokeless tobacco: Never   Vaping Use   Vaping Use: Never used  Substance and Sexual Activity   Alcohol use: Not Currently    Alcohol/week: 0.0 standard drinks of alcohol   Drug use: Never   Sexual activity: Not Currently  Other Topics Concern   Not on file  Social History Narrative   Lives at home alone   Right handed   Caffeine: 1-2 cups daily   Social Determinants of Health   Financial Resource Strain: Low Risk  (05/23/2022)   Overall Financial Resource Strain (CARDIA)    Difficulty of Paying Living Expenses: Not hard at all  Food Insecurity: No Food Insecurity (05/23/2022)   Hunger Vital Sign    Worried About Running Out of Food in the Last Year: Never true    Ran Out of Food in the Last Year: Never true  Transportation Needs: No Transportation Needs (05/23/2022)   PRAPARE - Administrator, Civil Service (Medical): No    Lack of Transportation (Non-Medical): No  Physical Activity: Inactive (05/17/2021)   Exercise Vital Sign    Days of Exercise per Week: 0 days    Minutes of Exercise per Session: 0 min  Stress: No Stress Concern Present (05/23/2022)   [employer] of Occupational Health - Occupational Stress Questionnaire    Feeling of Stress : Not at all  Social Connections: Socially Isolated (05/23/2022)   Social Connection and Isolation Panel [NHANES]    Frequency of Communication with Friends and Family: More than three times a week    Frequency of Social Gatherings with Friends and Family: More than three times a week    Attends Religious Services: Never    Database administrator or Organizations: No    Attends Banker Meetings: Never    Marital Status: Widowed     Family History: The patient's family history includes Cancer in her maternal aunt; Colon polyps in her sister; Diabetes type II in her brother and father; Heart attack in her mother; Heart disease in her mother; Heart failure in her father; Stroke in her father.  ROS:   Please see the history of  present illness.    All other systems reviewed and are negative.  EKGs/Labs/Other Studies Reviewed:    The following studies were reviewed today: Cardiac Studies & Procedures       ECHOCARDIOGRAM  ECHOCARDIOGRAM COMPLETE 09/06/2022  Narrative ECHOCARDIOGRAM REPORT    Patient Name:   [name]  Date of Exam: 09/06/2022 Medical Rec #:  [id number]     Height:       64.5 in Accession #:    [id number]    Weight:  197.2 lb Date of Birth:  [birth year]     BSA:          1.955 m Patient Age:    88 years      BP:           131/80 mmHg Patient Gender: F             HR:           60 bpm. Exam Location:  [address]  Procedure: 2D Echo, Cardiac Doppler and Color Doppler  Indications:    I35.9 AVR  History:        Patient has prior history of Echocardiogram examinations, most recent 05/30/2020. CHF, H/o breast cancer, AVR; Risk Factors:Hypertension, Dyslipidemia and Former Smoker.  Sonographer:    Samule Ohm RDCS Referring Phys: [phone] Ido Wollman  IMPRESSIONS   1. Left ventricular ejection fraction, by estimation, is 60 to 65%. The left ventricle has normal function. The left ventricle has no regional wall motion abnormalities. There is moderate concentric left ventricular hypertrophy. Left ventricular diastolic parameters are consistent with Grade II diastolic dysfunction (pseudonormalization). 2. Right ventricular systolic function is normal. The right ventricular size is normal. There is mildly elevated pulmonary artery systolic pressure. 3. Left atrial size was mildly dilated. 4. Right atrial size was mildly dilated. 5. No evidence of mitral valve regurgitation. 6. S/p bioprosthetic aortic valve placed 05/2011. No PVL. V max 2.8 m/s. DI 0.44. EOA 1.25 cm2. Normal prosthesis. Aortic valve regurgitation is not visualized. 7. The inferior vena cava is normal in size with greater than 50% respiratory variability, suggesting right atrial pressure of 3 mmHg.  Comparison(s):  No significant change from prior study.  FINDINGS Left Ventricle: Left ventricular ejection fraction, by estimation, is 60 to 65%. The left ventricle has normal function. The left ventricle has no regional wall motion abnormalities. The left ventricular internal cavity size was normal in size. There is moderate concentric left ventricular hypertrophy. Left ventricular diastolic parameters are consistent with Grade II diastolic dysfunction (pseudonormalization).  Right Ventricle: The right ventricular size is normal. Right ventricular systolic function is normal. There is mildly elevated pulmonary artery systolic pressure. The tricuspid regurgitant velocity is 2.99 m/s, and with an assumed right atrial pressure of 3 mmHg, the estimated right ventricular systolic pressure is 38.8 mmHg.  Left Atrium: Left atrial size was mildly dilated.  Right Atrium: Right atrial size was mildly dilated.  Pericardium: Trivial pericardial effusion is present.  Mitral Valve: No evidence of mitral valve regurgitation.  Tricuspid Valve: Tricuspid valve regurgitation is mild.  Aortic Valve: S/p bioprosthetic aortic valve placed 05/2011. No PVL. V max 2.8 m/s. DI 0.44. EOA 1.25 cm2. Normal prosthesis. Aortic valve regurgitation is not visualized. Aortic valve mean gradient measures 17.7 mmHg. Aortic valve peak gradient measures 31.7 mmHg. Aortic valve area, by VTI measures 1.39 cm.  Pulmonic Valve: Pulmonic valve regurgitation is not visualized.  Aorta: The aortic root and ascending aorta are structurally normal, with no evidence of dilitation.  Venous: The inferior vena cava is normal in size with greater than 50% respiratory variability, suggesting right atrial pressure of 3 mmHg.  IAS/Shunts: No atrial level shunt detected by color flow Doppler.   LEFT VENTRICLE PLAX 2D LVIDd:         4.10 cm   Diastology LVIDs:         2.90 cm   LV e' medial:    6.85 cm/s LV PW:         1.40 cm  LV E/e' medial:   16.6 LV IVS:        1.40 cm   LV e' lateral:   9.25 cm/s LVOT diam:     2.00 cm   LV E/e' lateral: 12.3 LV SV:         104 LV SV Index:   53 LVOT Area:     3.14 cm   RIGHT VENTRICLE             IVC RV S prime:     13.40 cm/s  IVC diam: 1.90 cm TAPSE (M-mode): 1.9 cm RVSP:           38.8 mmHg  LEFT ATRIUM             Index        RIGHT ATRIUM           Index LA diam:        4.40 cm 2.25 cm/m   RA Pressure: 3.00 mmHg LA Vol (A2C):   67.2 ml 34.37 ml/m  RA Area:     25.90 cm LA Vol (A4C):   75.1 ml 38.41 ml/m  RA Volume:   85.00 ml  43.47 ml/m LA Biplane Vol: 74.6 ml 38.15 ml/m AORTIC VALVE AV Area (Vmax):    1.25 cm AV Area (Vmean):   1.32 cm AV Area (VTI):     1.39 cm AV Vmax:           281.33 cm/s AV Vmean:          195.667 cm/s AV VTI:            0.747 m AV Peak Grad:      31.7 mmHg AV Mean Grad:      17.7 mmHg LVOT Vmax:         112.00 cm/s LVOT Vmean:        82.200 cm/s LVOT VTI:          0.330 m LVOT/AV VTI ratio: 0.44  AORTA Ao Root diam: 3.30 cm Ao Asc diam:  3.40 cm  MITRAL VALVE                TRICUSPID VALVE MV Area (PHT): 2.84 cm     TR Peak grad:   35.8 mmHg MV Decel Time: 267 msec     TR Vmax:        299.00 cm/s MV E velocity: 114.00 cm/s  Estimated RAP:  3.00 mmHg MV A velocity: 109.00 cm/s  RVSP:           38.8 mmHg MV E/A ratio:  1.05 SHUNTS Systemic VTI:  0.33 m Systemic Diam: 2.00 cm  Carolan ClinesMary Branch Electronically signed by Carolan ClinesMary Branch Signature Date/Time: 09/06/2022/2:20:45 PM    Final              EKG:  EKG is ordered today.  The ekg ordered today demonstrates sinus bradycardia 57 bpm, occasional PAC, LVH with QRS widening.  Recent Labs: 06/12/2022: ALT 25; BUN 23; Creatinine, Ser 0.80; Hemoglobin 15.4; Platelets 137.0; Potassium 4.1; Sodium 141; TSH 2.04  Recent Lipid Panel    Component Value Date/Time   CHOL 193 06/12/2022 0846   CHOL 185 11/11/2017 1114   TRIG 81.0 06/12/2022 0846   TRIG 58 05/13/2006 0933   HDL 52.40  06/12/2022 0846   HDL 54 11/11/2017 1114   CHOLHDL 4 06/12/2022 0846   VLDL 16.2 06/12/2022 0846   LDLCALC 125 (H) 06/12/2022 0846   LDLCALC 111 (H) 11/11/2017 1114  LDLDIRECT 162.4 03/18/2012 0900     Risk Assessment/Calculations:          Physical Exam:    VS:  BP (!) 140/58 (BP Location: Left Arm, Patient Position: Sitting, Cuff Size: Large)   Pulse (!) 57   Ht 5\' 5"  (1.651 m)   Wt 185 lb (83.9 kg)   LMP 05/29/2007   SpO2 94%   BMI 30.79 kg/m     Wt Readings from Last 3 Encounters:  09/06/22 185 lb (83.9 kg)  06/19/22 197 lb 4 oz (89.5 kg)  05/23/22 206 lb (93.4 kg)     GEN:  Well nourished, well developed pleasant elderly woman in no acute distress HEENT: Normal NECK: No JVD; No carotid bruits LYMPHATICS: No lymphadenopathy CARDIAC: RRR, 2/6 systolic murmur at the RUSB RESPIRATORY:  Clear to auscultation without rales, wheezing or rhonchi  ABDOMEN: Soft, non-tender, non-distended MUSCULOSKELETAL:  2+ bilateral pretibial and ankle edema; No deformity  SKIN: Warm and dry NEUROLOGIC:  Alert and oriented x 3 PSYCHIATRIC:  Normal affect   ASSESSMENT:    1. Aortic valve disorder   2. Essential hypertension   3. Chronic diastolic heart failure    PLAN:    In order of problems listed above:  I reviewed the patient's echocardiogram today.  The formal interpretation is currently pending, but I will review the report as soon as it is completed.  On my review of images, the patient has vigorous LV function with LVEF greater than 70%, stable function of her aortic valve bioprosthesis with peak and mean gradients of 33 and 18 mmHg, respectively, a dimensionless index of 0.4, and an aortic valve area of 1.33 cm.  Recommend clinical follow-up and a repeat echocardiogram in 1 year.  She is greater than a decade out from bioprosthetic aortic valve replacement. Blood pressure is controlled on carvedilol, spironolactone, and furosemide. Volume status appears stable.  I think  her lower extremity edema is primarily related to venous insufficiency.           Medication Adjustments/Labs and Tests Ordered: Current medicines are reviewed at length with the patient today.  Concerns regarding medicines are outlined above.  Orders Placed This Encounter  Procedures   EKG 12-Lead   ECHOCARDIOGRAM COMPLETE   No orders of the defined types were placed in this encounter.   Patient Instructions  Medication Instructions:  Your physician recommends that you continue on your current medications as directed. Please refer to the Current Medication list given to you today.  *If you need a refill on your cardiac medications before your next appointment, please call your pharmacy*   Lab Work: NONE If you have labs (blood work) drawn today and your tests are completely normal, you will receive your results only by: MyChart Message (if you have MyChart) OR A paper copy in the mail If you have any lab test that is abnormal or we need to change your treatment, we will call you to review the results.   Testing/Procedures: ECHO (in 1 year, prior to appt) Your physician has requested that you have an echocardiogram. Echocardiography is a painless test that uses sound waves to create images of your heart. It provides your doctor with information about the size and shape of your heart and how well your heart's chambers and valves are working. This procedure takes approximately one hour. There are no restrictions for this procedure. Please do NOT wear cologne, perfume, aftershave, or lotions (deodorant is allowed). Please arrive 15 minutes prior to your  appointment time.  Follow-Up: At [facility], you and your health needs are our priority.  As part of our continuing mission to provide you with exceptional heart care, we have created designated Provider Care Teams.  These Care Teams include your primary Cardiologist (physician) and Advanced Practice Providers (APPs -   Physician Assistants and Nurse Practitioners) who all work together to provide you with the care you need, when you need it.  Your next appointment:   1 year(s)  Provider:   [name], MD        Signed, [name], MD  09/06/2022 5:20 PM    [facility]

## 2022-09-29 ENCOUNTER — Other Ambulatory Visit: Payer: Self-pay | Admitting: Cardiovascular Disease

## 2022-12-11 DIAGNOSIS — D1801 Hemangioma of skin and subcutaneous tissue: Secondary | ICD-10-CM | POA: Diagnosis not present

## 2022-12-11 DIAGNOSIS — I8311 Varicose veins of right lower extremity with inflammation: Secondary | ICD-10-CM | POA: Diagnosis not present

## 2022-12-11 DIAGNOSIS — L821 Other seborrheic keratosis: Secondary | ICD-10-CM | POA: Diagnosis not present

## 2022-12-11 DIAGNOSIS — I8312 Varicose veins of left lower extremity with inflammation: Secondary | ICD-10-CM | POA: Diagnosis not present

## 2022-12-11 DIAGNOSIS — L218 Other seborrheic dermatitis: Secondary | ICD-10-CM | POA: Diagnosis not present

## 2022-12-11 DIAGNOSIS — L820 Inflamed seborrheic keratosis: Secondary | ICD-10-CM | POA: Diagnosis not present

## 2022-12-11 DIAGNOSIS — L304 Erythema intertrigo: Secondary | ICD-10-CM | POA: Diagnosis not present

## 2022-12-11 DIAGNOSIS — I872 Venous insufficiency (chronic) (peripheral): Secondary | ICD-10-CM | POA: Diagnosis not present

## 2022-12-29 ENCOUNTER — Other Ambulatory Visit: Payer: Self-pay | Admitting: Cardiovascular Disease

## 2022-12-31 NOTE — Telephone Encounter (Signed)
Pt's medication was sent to pt's pharmacy as requested. Confirmation received.  °

## 2023-01-17 ENCOUNTER — Other Ambulatory Visit: Payer: Self-pay | Admitting: Cardiovascular Disease

## 2023-04-09 DIAGNOSIS — Z23 Encounter for immunization: Secondary | ICD-10-CM | POA: Diagnosis not present

## 2023-05-07 ENCOUNTER — Ambulatory Visit (INDEPENDENT_AMBULATORY_CARE_PROVIDER_SITE_OTHER): Payer: Medicare Other

## 2023-05-07 ENCOUNTER — Other Ambulatory Visit: Payer: Self-pay | Admitting: Family Medicine

## 2023-05-07 VITALS — Ht 65.0 in | Wt 200.0 lb

## 2023-05-07 DIAGNOSIS — Z1231 Encounter for screening mammogram for malignant neoplasm of breast: Secondary | ICD-10-CM

## 2023-05-07 DIAGNOSIS — Z0000 Encounter for general adult medical examination without abnormal findings: Secondary | ICD-10-CM

## 2023-05-07 NOTE — Patient Instructions (Signed)
[name] , Thank you for taking time to come for your Medicare Wellness Visit. I appreciate your ongoing commitment to your health goals. Please review the following plan we discussed and let me know if I can assist you in the future.   Referrals/Orders/Follow-Ups/Clinician Recommendations: none  This is a list of the screening recommended for you and due dates:  Health Maintenance  Topic Date Due   Zoster (Shingles) Vaccine (1 of 2) 10/16/1985   Mammogram  06/16/2022   Flu Shot  12/27/2022   COVID-19 Vaccine (1 - 2023-24 season) Never done   Medicare Annual Wellness Visit  05/06/2024   DTaP/Tdap/Td vaccine (4 - Td or Tdap) 05/20/2031   Pneumonia Vaccine  Completed   DEXA scan (bone density measurement)  Completed   HPV Vaccine  Aged Out    Advanced directives: (In Chart) A copy of your advanced directives are scanned into your chart should your provider ever need it.  Next Medicare Annual Wellness Visit scheduled for next year: Yes 05/07/24 @ 1pm telephone

## 2023-05-07 NOTE — Progress Notes (Signed)
Subjective:   [name] is a 89 y.o. female who presents for Medicare Annual (Subsequent) preventive examination.  Visit Complete: Virtual I connected with  [name] on 05/07/23 by a audio enabled telemedicine application and verified that I am speaking with the correct person using two identifiers.  Patient Location: Home  Provider Location: Home Office  I discussed the limitations of evaluation and management by telemedicine. The patient expressed understanding and agreed to proceed.  Vital Signs: Because this visit was a virtual/telehealth visit, some criteria may be missing or patient reported. Any vitals not documented were not able to be obtained and vitals that have been documented are patient reported.  Patient Medicare AWV questionnaire was completed by the patient on (not done); I have confirmed that all information answered by patient is correct and no changes since this date.  Cardiac Risk Factors include: advanced age (>43men, >73 women);hypertension;obesity (BMI >30kg/m2);sedentary lifestyle     Objective:    Today's Vitals   05/07/23 1305 05/07/23 1306  Weight: 200 lb (90.7 kg)   Height: 5\' 5"  (1.651 m)   PainSc:  2    Body mass index is 33.28 kg/m.     05/07/2023    1:18 PM 05/23/2022    3:34 PM 05/17/2021    2:53 PM 12/23/2019   10:39 AM 11/19/2018   11:11 AM 12/20/2017   10:42 AM 11/11/2017   10:59 AM  Advanced Directives  Does Patient Have a Medical Advance Directive? Yes Yes Yes No Yes Yes Yes  Type of Estate agent of [place];Living will Healthcare Power of [employer] Power of [employer] Power of [employer] Power of Attorney  Does patient want to make changes to medical advance directive?  No - Patient declined Yes (MAU/Ambulatory/Procedural Areas - Information given)      Copy of Healthcare Power of Attorney in Chart? Yes - validated most recent copy scanned in chart (See row information) No -  copy requested     No - copy requested  Would patient like information on creating a medical advance directive?    No - Patient declined       Current Medications (verified) Outpatient Encounter Medications as of 05/07/2023  Medication Sig   carvedilol (COREG) 25 MG tablet TAKE 1 TABLET BY MOUTH TWICE A DAY WITH MEALS   Cholecalciferol (VITAMIN D) 1000 UNITS capsule Take 1 capsule (1,000 Units total) by mouth 2 (two) times daily.   diphenhydrAMINE (BENADRYL) 25 mg capsule Take 25 mg by mouth every 6 (six) hours as needed. Patient taking to help sleep   furosemide (LASIX) 20 MG tablet TAKE 1 TABLET BY MOUTH EVERY DAY   Omega-3 Fatty Acids (FISH OIL) 1000 MG CAPS Take 2,000 mg by mouth daily.   potassium chloride SA (KLOR-CON M20) 20 MEQ tablet TAKE 1 TABLET BY MOUTH EVERY DAY   Psyllium (METAMUCIL PO) Take 1 Dose by mouth as directed.    spironolactone (ALDACTONE) 25 MG tablet TAKE 1 TABLET (25 MG TOTAL) BY MOUTH DAILY.   amoxicillin (AMOXIL) 500 MG capsule Take 2,000 mg by mouth daily. Take prior to dental   No facility-administered encounter medications on file as of 05/07/2023.    Allergies (verified) Aspirin, Calcium, Diovan [valsartan], Ezetimibe, Ramipril, and Simvastatin   History: Past Medical History:  Diagnosis Date   Alcohol abuse, unspecified    Aortic stenosis    s/p tissue AVR 05/2011   Arthritis    Breast CA (HCC)  rt   Diaphragmatic hernia without mention of obstruction or gangrene    Diverticulosis of colon (without mention of hemorrhage)    Dyskinesia of esophagus    Endometrial cancer (HCC)    Family history of colonic polyps    Heart murmur    HLD (hyperlipidemia)    HTN (hypertension)    Internal hemorrhoids without mention of complication    Malignant neoplasm of corpus uteri, except isthmus (HCC)    Other abnormal glucose    Pain in joint, shoulder region    Postmenopausal bleeding    Tobacco use disorder    Unspecified hearing loss    Vertigo     Past Surgical History:  Procedure Laterality Date   AORTIC VALVE REPLACEMENT  06/13/2011   Procedure: AORTIC VALVE REPLACEMENT (AVR);  Surgeon: [name], MD;  Location: [facility] OR;  Service: Open Heart Surgery;  Laterality: N/A;   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   BREAST LUMPECTOMY  11/2009   DCIS, neg sentinel lymph node biopsy   CATARACT EXTRACTION     bil   LAPAROSCOPY W/ TOTAL BILATERAL PELVIC AND PERI-AORTIC LYMPHADENECTOMY  11/04/2007   PARAAORTIC LYMPHADENECTOMY     ROTATOR CUFF REPAIR  05/2008   right   TOTAL ABDOMINAL HYSTERECTOMY W/ BILATERAL SALPINGOOPHORECTOMY  11/04/07   salpingo-oophrectomy   Family History  Problem Relation Age of Onset   Colon polyps Sister        2010   Heart disease Mother    Heart attack Mother    Stroke Father    Heart failure Father    Diabetes type II Father    Diabetes type II Brother    Cancer Maternal Aunt        possible post menopausal breast cancer   Social History   Socioeconomic History   Marital status: Widowed    Spouse name: Not on file   Number of children: 2   Years of education: Not on file   Highest education level: High school graduate  Occupational History   Not on file  Tobacco Use   Smoking status: Former    Current packs/day: 0.00    Types: Cigarettes    Quit date: 03/28/2000    Years since quitting: 23.1   Smokeless tobacco: Never  Vaping Use   Vaping status: Never Used  Substance and Sexual Activity   Alcohol use: Not Currently    Alcohol/week: 0.0 standard drinks of alcohol   Drug use: Never   Sexual activity: Not Currently  Other Topics Concern   Not on file  Social History Narrative   Lives at home alone   Right handed   Caffeine: 1-2 cups daily   Social Determinants of Health   Financial Resource Strain: Low Risk  (05/07/2023)   Overall Financial Resource Strain (CARDIA)    Difficulty of Paying Living Expenses: Not hard at all  Food Insecurity: No Food Insecurity (05/07/2023)   Hunger Vital  Sign    Worried About Running Out of Food in the Last Year: Never true    Ran Out of Food in the Last Year: Never true  Transportation Needs: No Transportation Needs (05/07/2023)   PRAPARE - Administrator, Civil Service (Medical): No    Lack of Transportation (Non-Medical): No  Physical Activity: Inactive (05/07/2023)   Exercise Vital Sign    Days of Exercise per Week: 0 days    Minutes of Exercise per Session: 0 min  Stress: No Stress Concern Present (  05/07/2023)   [place] [employer] - Occupational Stress Questionnaire    Feeling of Stress : Not at all  Social Connections: Socially Isolated (05/07/2023)   Social Connection and Isolation Panel [NHANES]    Frequency of Communication with Friends and Family: More than three times a week    Frequency of Social Gatherings with Friends and Family: More than three times a week    Attends Religious Services: Never    Database administrator or Organizations: No    Attends Banker Meetings: Never    Marital Status: Widowed    Tobacco Counseling Counseling given: Not Answered   Clinical Intake:  Pre-visit preparation completed: No  Pain : 0-10 Pain Score: 2  Pain Type: Chronic pain Pain Location: Leg (both legs) Pain Descriptors / Indicators: Aching Pain Onset: More than a month ago Pain Frequency: Intermittent Pain Relieving Factors: moving around  Pain Relieving Factors: moving around  BMI - recorded: 33.28 Nutritional Status: BMI > 30  Obese Nutritional Risks: None Diabetes: No  How often do you need to have someone help you when you read instructions, pamphlets, or other written materials from your doctor or pharmacy?: 1 - Never  Interpreter Needed?: No  Comments: lives alone Information entered by :: B.Andrei Mccook,LPN   Activities of Daily Living    05/07/2023    1:18 PM 05/23/2022    3:38 PM  In your present state of health, do you have any difficulty performing the  following activities:  Hearing? 0 1  Comment  Hearing aids  Vision? 0 0  Difficulty concentrating or making decisions? 1 0  Walking or climbing stairs? 0 1  Comment  Cane/walker in use when ambulating  Dressing or bathing? 0 0  Doing errands, shopping? 0 0  Preparing Food and eating ? N N  Using the Toilet? N N  In the past six months, have you accidently leaked urine? N Y  Comment  Managed with a daily pad  Do you have problems with loss of bowel control? N N  Managing your Medications? N N  Managing your Finances? N N  Housekeeping or managing your Housekeeping? N N    Patient Care Team: Tower, Audrie Gallus, MD as PCP - Jerelene Redden, MD as PCP - Cardiology (Cardiology) [name], MD (Cardiothoracic Surgery) Reva Bores, MD as Consulting Physician (Obstetrics and Gynecology) Flo Shanks, MD as Consulting Physician (Otolaryngology) Lisa Roca, DDS as Consulting Physician (Dentistry) Maris Berger, MD as Consulting Physician (Ophthalmology)  Indicate any recent Medical Services you may have received from other than [facility] providers in the past year (date may be approximate).     Assessment:   This is a routine wellness examination for [name].  Hearing/Vision screen Hearing Screening - Comments:: Pt says her hearing is alright with aids Vision Screening - Comments:: Pt says her vision is good w/glasses [facility]   Goals Addressed             This Visit's Progress    Maintain healthy lifestyle   On track    Stay active Stay hydrated Healthy diet     Patient Stated   On track    Starting 11/19/2018, I will continue to take medications as prescribed.     COMPLETED: Patient Stated   On track    Would like to drink more water       Depression Screen    05/07/2023    1:14 PM 05/23/2022  3:48 PM 05/17/2021    3:01 PM 12/23/2019   10:41 AM 11/19/2018   11:13 AM 12/20/2017   10:41 AM 11/11/2017   10:57 AM  PHQ 2/9 Scores  PHQ  - 2 Score 0 0 0 0 0 0 0  PHQ- 9 Score    0 0  0    Fall Risk    05/07/2023    1:11 PM 05/23/2022    3:53 PM 05/17/2021    2:58 PM 12/23/2019   10:41 AM 11/19/2018   11:13 AM  Fall Risk   Falls in the past year? 0 0 0 0 0  Number falls in past yr: 0 0 0 0   Injury with Fall? 0 0 0 0   Risk for fall due to : Impaired balance/gait Impaired balance/gait Impaired balance/gait Medication side effect;Impaired balance/gait   Follow up Education provided;Falls prevention discussed Falls evaluation completed;Falls prevention discussed;Education provided Falls prevention discussed Falls evaluation completed;Falls prevention discussed     MEDICARE RISK AT HOME: Medicare Risk at Home Any stairs in or around the home?: Yes If so, are there any without handrails?: Yes Home free of loose throw rugs in walkways, pet beds, electrical cords, etc?: Yes Adequate lighting in your home to reduce risk of falls?: Yes Life alert?: No Use of a cane, walker or w/c?: Yes Grab bars in the bathroom?: Yes Shower chair or bench in shower?: No Elevated toilet seat or a handicapped toilet?: Yes  TIMED UP AND GO:  Was the test performed?  No    Cognitive Function:    12/23/2019   10:44 AM 11/19/2018   11:12 AM 11/11/2017   10:59 AM 11/07/2016   10:40 AM 09/15/2015    8:47 AM  MMSE - Mini Mental State Exam  Orientation to time 5 5 5 5 5   Orientation to Place 5 5 5 5 5   Registration 3 3 3 3 3   Attention/ Calculation 5 0 0 0 0  Recall 3 3 3 3 3   Language- name 2 objects  0 0 0 0  Language- repeat 1 1 1 1 1   Language- follow 3 step command  0 3 3 3   Language- read & follow direction  0 0 0 0  Write a sentence  0 0 0 0  Copy design  0 0 0 0  Total score  17 20 20 20         05/07/2023    1:20 PM 05/23/2022    3:51 PM  6CIT Screen  What Year? 0 points 0 points  What month? 0 points 0 points  What time? 0 points 0 points  Count back from 20 0 points 0 points  Months in reverse 0 points 0 points   Repeat phrase 0 points 0 points  Total Score 0 points 0 points    Immunizations Immunization History  Administered Date(s) Administered   Fluad Quad(high Dose 65+) 01/22/2019, 03/29/2023   Influenza Split 03/27/2011, 03/25/2012   Influenza Whole 03/07/2007, 02/17/2008, 03/08/2009, 03/20/2010   Influenza, High Dose Seasonal PF 02/03/2021, 05/10/2022   Influenza,inj,Quad PF,6+ Mos 03/06/2013, 02/23/2014, 02/24/2016   Influenza-Unspecified 02/27/2015, 01/19/2017, 12/26/2017   Pneumococcal Conjugate-13 01/15/2014   Pneumococcal Polysaccharide-23 03/08/2009   Td 05/29/1995, 03/08/2009   Tdap 05/19/2021   Zoster, Live 08/12/2007    TDAP status: Up to date  Flu Vaccine status: Up to date  Pneumococcal vaccine status: Up to date  Covid-19 vaccine status: Declined, Education has been provided regarding the importance of this vaccine  but patient still declined. Advised may receive this vaccine at local pharmacy or Health Dept.or vaccine clinic. Aware to provide a copy of the vaccination record if obtained from local pharmacy or Health Dept. Verbalized acceptance and understanding.  Qualifies for Shingles Vaccine? Yes   Zostavax completed No   Shingrix Completed?: No.    Education has been provided regarding the importance of this vaccine. Patient has been advised to call insurance company to determine out of pocket expense if they have not yet received this vaccine. Advised may also receive vaccine at local pharmacy or Health Dept. Verbalized acceptance and understanding.  Screening Tests Health Maintenance  Topic Date Due   MAMMOGRAM  06/16/2022   COVID-19 Vaccine (1 - 2023-24 season) 05/23/2023 (Originally 01/27/2023)   Zoster Vaccines- Shingrix (1 of 2) 08/05/2023 (Originally [birth year]1985)   Medicare Annual Wellness (AWV)  05/06/2024   DTaP/Tdap/Td (4 - Td or Tdap) 05/20/2031   Pneumonia Vaccine 47+ Years old  Completed   INFLUENZA VACCINE  Completed   DEXA SCAN  Completed   HPV  VACCINES  Aged Out    Health Maintenance  Health Maintenance Due  Topic Date Due   MAMMOGRAM  06/16/2022    Colorectal cancer screening: No longer required.   Mammogram status: No longer required due to age.   Lung Cancer Screening: (Low Dose CT Chest recommended if Age 61-80 years, 20 pack-year currently smoking OR have quit w/in 15years.) does not qualify.   Lung Cancer Screening Referral: no  Additional Screening:  Hepatitis C Screening: does not qualify; Completed no  Vision Screening: Recommended annual ophthalmology exams for early detection of glaucoma and other disorders of the eye. Is the patient up to date with their annual eye exam?  Yes  Who is the provider or what is the name of the office in which the patient attends annual eye exams? [facility] If pt is not established with a provider, would they like to be referred to a provider to establish care? No .   Dental Screening: Recommended annual dental exams for proper oral hygiene  Diabetic Foot Exam:   Community Resource Referral / Chronic Care Management: CRR required this visit?  No   CCM required this visit?  Appt scheduled with PCP and lab visit prior    Plan:     I have personally reviewed and noted the following in the patient's chart:   Medical and social history Use of alcohol, tobacco or illicit drugs  Current medications and supplements including opioid prescriptions. Patient is not currently taking opioid prescriptions. Functional ability and status Nutritional status Physical activity Advanced directives List of other physicians Hospitalizations, surgeries, and ER visits in previous 12 months Vitals Screenings to include cognitive, depression, and falls Referrals and appointments  In addition, I have reviewed and discussed with patient certain preventive protocols, quality metrics, and best practice recommendations. A written personalized care plan for preventive  services as well as general preventive health recommendations were provided to patient.     Sue Lush, LPN   [id number]   After Visit Summary: (Declined) Due to this being a telephonic visit, with patients personalized plan was offered to patient but patient Declined AVS at this time   Nurse Notes: The patient states she is doing well and has no concerns or questions at this time.  *Msg sent to PCP for lab orders prior to PE appt made

## 2023-05-23 ENCOUNTER — Other Ambulatory Visit: Payer: Self-pay | Admitting: Family Medicine

## 2023-05-30 ENCOUNTER — Ambulatory Visit
Admission: RE | Admit: 2023-05-30 | Discharge: 2023-05-30 | Disposition: A | Discharge status: Home / Self Care | Payer: Medicare Other | Source: Ambulatory Visit | Attending: Family Medicine | Admitting: Family Medicine

## 2023-05-30 DIAGNOSIS — Z1231 Encounter for screening mammogram for malignant neoplasm of breast: Secondary | ICD-10-CM | POA: Diagnosis not present

## 2023-06-16 ENCOUNTER — Telehealth: Payer: Self-pay | Admitting: Family Medicine

## 2023-06-16 DIAGNOSIS — E559 Vitamin D deficiency, unspecified: Secondary | ICD-10-CM

## 2023-06-16 DIAGNOSIS — I10 Essential (primary) hypertension: Secondary | ICD-10-CM

## 2023-06-16 DIAGNOSIS — E7800 Pure hypercholesterolemia, unspecified: Secondary | ICD-10-CM

## 2023-06-16 DIAGNOSIS — R7303 Prediabetes: Secondary | ICD-10-CM

## 2023-06-16 NOTE — Telephone Encounter (Signed)
-----   Message from Vincenza Hews sent at 06/04/2023  1:53 PM EST ----- Regarding: Lab Tues 06/18/23 Hello,  Patient is coming in for CPE labs on Tuesday 06/18/23. Can we get orders please.   Thanks

## 2023-06-18 ENCOUNTER — Other Ambulatory Visit (INDEPENDENT_AMBULATORY_CARE_PROVIDER_SITE_OTHER): Payer: Medicare Other

## 2023-06-18 DIAGNOSIS — I10 Essential (primary) hypertension: Secondary | ICD-10-CM

## 2023-06-18 DIAGNOSIS — E559 Vitamin D deficiency, unspecified: Secondary | ICD-10-CM

## 2023-06-18 DIAGNOSIS — E7800 Pure hypercholesterolemia, unspecified: Secondary | ICD-10-CM | POA: Diagnosis not present

## 2023-06-18 DIAGNOSIS — R7303 Prediabetes: Secondary | ICD-10-CM

## 2023-06-18 LAB — CBC WITH DIFFERENTIAL/PLATELET
Basophils Absolute: 0 10*3/uL (ref 0.0–0.1)
Basophils Relative: 0.7 % (ref 0.0–3.0)
Eosinophils Absolute: 0.1 10*3/uL (ref 0.0–0.7)
Eosinophils Relative: 3 % (ref 0.0–5.0)
HCT: 46.4 % — ABNORMAL HIGH (ref 36.0–46.0)
Hemoglobin: 15.6 g/dL — ABNORMAL HIGH (ref 12.0–15.0)
Lymphocytes Relative: 23.9 % (ref 12.0–46.0)
Lymphs Abs: 1.1 10*3/uL (ref 0.7–4.0)
MCHC: 33.6 g/dL (ref 30.0–36.0)
MCV: 88.6 fL (ref 78.0–100.0)
Monocytes Absolute: 0.5 10*3/uL (ref 0.1–1.0)
Monocytes Relative: 10.1 % (ref 3.0–12.0)
Neutro Abs: 2.9 10*3/uL (ref 1.4–7.7)
Neutrophils Relative %: 62.3 % (ref 43.0–77.0)
Platelets: 125 10*3/uL — ABNORMAL LOW (ref 150.0–400.0)
RBC: 5.23 Mil/uL — ABNORMAL HIGH (ref 3.87–5.11)
RDW: 13.8 % (ref 11.5–15.5)
WBC: 4.7 10*3/uL (ref 4.0–10.5)

## 2023-06-18 LAB — COMPREHENSIVE METABOLIC PANEL
ALT: 23 U/L (ref 0–35)
AST: 23 U/L (ref 0–37)
Albumin: 4.6 g/dL (ref 3.5–5.2)
Alkaline Phosphatase: 64 U/L (ref 39–117)
BUN: 22 mg/dL (ref 6–23)
CO2: 33 meq/L — ABNORMAL HIGH (ref 19–32)
Calcium: 10.1 mg/dL (ref 8.4–10.5)
Chloride: 104 meq/L (ref 96–112)
Creatinine, Ser: 0.81 mg/dL (ref 0.40–1.20)
GFR: 65.16 mL/min (ref >60.00)
Glucose, Bld: 107 mg/dL — ABNORMAL HIGH (ref 70–99)
Potassium: 4.4 meq/L (ref 3.5–5.1)
Sodium: 143 meq/L (ref 135–145)
Total Bilirubin: 1 mg/dL (ref 0.2–1.2)
Total Protein: 6.9 g/dL (ref 6.0–8.3)

## 2023-06-18 LAB — LIPID PANEL
Cholesterol: 198 mg/dL (ref 0–200)
HDL: 57.1 mg/dL (ref >39.00)
LDL Cholesterol: 124 mg/dL — ABNORMAL HIGH (ref 0–99)
NonHDL: 141.04
Total CHOL/HDL Ratio: 3
Triglycerides: 85 mg/dL (ref 0.0–149.0)
VLDL: 17 mg/dL (ref 0.0–40.0)

## 2023-06-18 LAB — VITAMIN D 25 HYDROXY (VIT D DEFICIENCY, FRACTURES): VITD: 49.54 ng/mL (ref 30.00–100.00)

## 2023-06-18 LAB — HEMOGLOBIN A1C: Hgb A1c MFr Bld: 6 % (ref 4.6–6.5)

## 2023-06-18 LAB — TSH: TSH: 2.83 u[IU]/mL (ref 0.35–5.50)

## 2023-06-25 ENCOUNTER — Ambulatory Visit (INDEPENDENT_AMBULATORY_CARE_PROVIDER_SITE_OTHER): Payer: Medicare Other | Admitting: Family Medicine

## 2023-06-25 ENCOUNTER — Encounter: Payer: Self-pay | Admitting: Family Medicine

## 2023-06-25 VITALS — BP 141/68 | HR 55 | Temp 97.3°F | Ht 64.5 in | Wt 206.0 lb

## 2023-06-25 DIAGNOSIS — R7303 Prediabetes: Secondary | ICD-10-CM | POA: Diagnosis not present

## 2023-06-25 DIAGNOSIS — E7800 Pure hypercholesterolemia, unspecified: Secondary | ICD-10-CM

## 2023-06-25 DIAGNOSIS — E559 Vitamin D deficiency, unspecified: Secondary | ICD-10-CM

## 2023-06-25 DIAGNOSIS — I5032 Chronic diastolic (congestive) heart failure: Secondary | ICD-10-CM | POA: Diagnosis not present

## 2023-06-25 DIAGNOSIS — I10 Essential (primary) hypertension: Secondary | ICD-10-CM

## 2023-06-25 DIAGNOSIS — K648 Other hemorrhoids: Secondary | ICD-10-CM

## 2023-06-25 MED ORDER — HYDROCORTISONE (PERIANAL) 2.5 % EX CREA: 1.0000 | TOPICAL_CREAM | Freq: Two times a day (BID) | CUTANEOUS | 1 refills | Status: AC | PRN

## 2023-06-25 NOTE — Assessment & Plan Note (Signed)
Continues cardiology care  Spironolactone 25 mg daily  Lasix 20 mg daily  Coreg 25 mg bid  No symptoms today

## 2023-06-25 NOTE — Assessment & Plan Note (Signed)
Lab Results  Component Value Date   HGBA1C 6.0 06/18/2023   HGBA1C 5.7 06/12/2022   HGBA1C 6.4 05/17/2021   disc imp of low glycemic diet and wt loss to prevent DM2  Handouts given

## 2023-06-25 NOTE — Assessment & Plan Note (Signed)
  bp in fair control at this time  BP Readings from Last 1 Encounters:  06/25/23 (!) 141/68   No changes needed Most recent labs reviewed  Disc lifstyle change with low sodium diet and exercise  Plan to continue Spironolactone 25 mg daily  Lasix 20 mg daily  Coreg 25 mg bid  K level is normal Continues cardiology follow up for DD

## 2023-06-25 NOTE — Assessment & Plan Note (Signed)
Last vitamin D Lab Results  Component Value Date   VD25OH 49.54 06/18/2023   Vitamin D level is therapeutic with current supplementation Disc importance of this to bone and overall health

## 2023-06-25 NOTE — Assessment & Plan Note (Signed)
Refilled anusol hc cream for prn use

## 2023-06-25 NOTE — Assessment & Plan Note (Signed)
Disc goals for lipids and reasons to control them Rev last labs with pt Rev low sat fat diet in detail  LDL stable at 124 Diet controlled Also takes fish oil

## 2023-06-25 NOTE — Progress Notes (Signed)
Subjective:    Patient ID: [name], female    DOB: 05/25/1934, 89 y.o.   MRN: [id number]  HPI  Here for annual follow up of chronic medical problems   Wt Readings from Last 3 Encounters:  06/25/23 206 lb (93.4 kg)  05/07/23 200 lb (90.7 kg)  09/06/22 185 lb (83.9 kg)   34.81 kg/m  Vitals:   06/25/23 0914 06/25/23 0949  BP: (!) 150/68 (!) 141/68  Pulse: (!) 55   Temp: (!) 97.3 F (36.3 C)   SpO2: 94%     Immunization History  Administered Date(s) Administered   Fluad Quad(high Dose 65+) 01/22/2019, 03/29/2023   Influenza Split 03/27/2011, 03/25/2012   Influenza Whole 03/07/2007, 02/17/2008, 03/08/2009, 03/20/2010   Influenza, High Dose Seasonal PF 02/03/2021, 05/10/2022   Influenza,inj,Quad PF,6+ Mos 03/06/2013, 02/23/2014, 02/24/2016   Influenza-Unspecified 02/27/2015, 01/19/2017, 12/26/2017   Pneumococcal Conjugate-13 01/15/2014   Pneumococcal Polysaccharide-23 03/08/2009   Td 05/29/1995, 03/08/2009   Tdap 05/19/2021   Zoster, Live 08/12/2007    There are no preventive care reminders to display for this patient.  Overall feels pretty good  Chronic leg pain bothers her  Sleep is not great  (sleeps 2-4 hours and wakes up)- takes another 2 hours to go back to sleep   Some naps during the day    Declines shingrix/ had zostavax   Mammogram 05/2023 Self breast exam-no lumps  Declines breast exam  Gyn health Past history of endometrial cancer with TAH/SBO    Bone health  Dexa 05/2021 -normal bmd  Falls- none  Fractures- none  Supplements  Last vitamin D Lab Results  Component Value Date   VD25OH 49.54 06/18/2023    Exercise  Tires easily  Not much    Sees audiology for hearing loss New hearing aides in the spring   Skin care -goes every year for skin exam     Mood    05/07/2023    1:14 PM 05/23/2022    3:48 PM 05/17/2021    3:01 PM 12/23/2019   10:41 AM 11/19/2018   11:13 AM  Depression screen PHQ 2/9  Decreased Interest 0 0 0  0 0  Down, Depressed, Hopeless 0 0 0 0 0  PHQ - 2 Score 0 0 0 0 0  Altered sleeping    0 0  Tired, decreased energy    0 0  Change in appetite    0 0  Feeling bad or failure about yourself     0 0  Trouble concentrating    0 0  Moving slowly or fidgety/restless    0 0  Suicidal thoughts    0 0  PHQ-9 Score    0 0  Difficult doing work/chores    Not difficult at all Not difficult at all    HTN with chronic diastolic HF bp is stable today  No cp or palpitations or headaches or edema  No side effects to medicines  BP Readings from Last 3 Encounters:  06/25/23 (!) 141/68  09/06/22 (!) 140/58  06/19/22 131/80    Spironolactone 25 mg daily  Lasix 20 mg daily  Coreg 25 mg bid  This am she drank a cup of coffee-this makes blood pressure higher   Lab Results  Component Value Date   NA 143 06/18/2023   K 4.4 06/18/2023   CO2 33 (H) 06/18/2023   GLUCOSE 107 (H) 06/18/2023   BUN 22 06/18/2023   CREATININE 0.81 06/18/2023   CALCIUM 10.1 06/18/2023  GFR 65.16 06/18/2023   EGFR 68 (L) 06/23/2015   GFRNONAA 69 11/11/2017   Hyperlipidemia Lab Results  Component Value Date   CHOL 198 06/18/2023   CHOL 193 06/12/2022   CHOL 191 05/17/2021   Lab Results  Component Value Date   HDL 57.10 06/18/2023   HDL 52.40 06/12/2022   HDL 52.30 05/17/2021   Lab Results  Component Value Date   LDLCALC 124 (H) 06/18/2023   LDLCALC 125 (H) 06/12/2022   LDLCALC 121 (H) 05/17/2021   Lab Results  Component Value Date   TRIG 85.0 06/18/2023   TRIG 81.0 06/12/2022   TRIG 87.0 05/17/2021   Lab Results  Component Value Date   CHOLHDL 3 06/18/2023   CHOLHDL 4 06/12/2022   CHOLHDL 4 05/17/2021   Lab Results  Component Value Date   LDLDIRECT 162.4 03/18/2012   LDLDIRECT 147.6 03/16/2010   diet controlled  Fish oil   Prediabetes Lab Results  Component Value Date   HGBA1C 6.0 06/18/2023    Lab Results  Component Value Date   WBC 4.7 06/18/2023   HGB 15.6 (H) 06/18/2023    HCT 46.4 (H) 06/18/2023   MCV 88.6 06/18/2023   PLT 125.0 (L) 06/18/2023   Hb is mildly high baseline Plt are usually mildly low  No issues with easy bruising or bleeding   Needs refill of her proctosol for hemorrhoids  Uses once in a while    Patient Active Problem List   Diagnosis Date Noted   Left shoulder pain 06/19/2022   Estrogen deficiency 05/25/2021   Chronic diastolic heart failure (HCC) 12/01/2018   Urge incontinence 10/15/2016   Nonallopathic lesion of lumbar region 03/28/2015   Nonallopathic lesion of thoracic region 03/28/2015   Encounter for Medicare annual wellness exam 01/15/2014   Risk for falls 03/25/2012   Vitamin D deficiency 08/29/2009   DEPENDENT EDEMA, LEGS 09/07/2008   Constipation 07/30/2008   Prediabetes 07/09/2008   History of endometrial cancer 10/28/2007   Hearing loss 10/15/2007   Internal hemorrhoids 10/15/2007   External hemorrhoids 10/15/2007   ESOPHAGEAL SPASM 10/15/2007   Diverticulosis of colon 10/15/2007   Hyperlipidemia 10/07/2007   Essential hypertension 10/07/2007   Aortic valve disorder 10/26/2004   Diaphragmatic hernia 12/27/1995   Past Medical History:  Diagnosis Date   Alcohol abuse, unspecified    Aortic stenosis    s/p tissue AVR 05/2011   Arthritis    Breast CA (HCC)    rt   Diaphragmatic hernia without mention of obstruction or gangrene    Diverticulosis of colon (without mention of hemorrhage)    Dyskinesia of esophagus    Endometrial cancer (HCC)    Family history of colonic polyps    Heart murmur    HLD (hyperlipidemia)    HTN (hypertension)    Internal hemorrhoids without mention of complication    Malignant neoplasm of corpus uteri, except isthmus (HCC)    Other abnormal glucose    Pain in joint, shoulder region    Postmenopausal bleeding    Tobacco use disorder    Unspecified hearing loss    Vertigo    Past Surgical History:  Procedure Laterality Date   AORTIC VALVE REPLACEMENT  06/13/2011    Procedure: AORTIC VALVE REPLACEMENT (AVR);  Surgeon: [name], MD;  Location: [facility] OR;  Service: Open Heart Surgery;  Laterality: N/A;   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   BREAST LUMPECTOMY  11/2009   DCIS, neg sentinel lymph node biopsy  CATARACT EXTRACTION     bil   LAPAROSCOPY W/ TOTAL BILATERAL PELVIC AND PERI-AORTIC LYMPHADENECTOMY  11/04/2007   PARAAORTIC LYMPHADENECTOMY     ROTATOR CUFF REPAIR  05/2008   right   TOTAL ABDOMINAL HYSTERECTOMY W/ BILATERAL SALPINGOOPHORECTOMY  11/04/07   salpingo-oophrectomy   Social History   Tobacco Use   Smoking status: Former    Current packs/day: 0.00    Types: Cigarettes    Quit date: 03/28/2000    Years since quitting: 23.2   Smokeless tobacco: Never  Vaping Use   Vaping status: Never Used  Substance Use Topics   Alcohol use: Not Currently    Alcohol/week: 0.0 standard drinks of alcohol   Drug use: Never   Family History  Problem Relation Age of Onset   Colon polyps Sister        2010   Heart disease Mother    Heart attack Mother    Stroke Father    Heart failure Father    Diabetes type II Father    Diabetes type II Brother    Cancer Maternal Aunt        possible post menopausal breast cancer   Allergies  Allergen Reactions   Aspirin Other (See Comments)    dizzy   Calcium     REACTION: constipation   Diovan [Valsartan]     REACTION: abdominal pain   Ezetimibe     REACTION: fatigue   Ramipril     REACTION: abdominal pain   Simvastatin     REACTION: leg pain and body aches   Current Outpatient Medications on File Prior to Visit  Medication Sig Dispense Refill   amoxicillin (AMOXIL) 500 MG capsule Take 2,000 mg by mouth daily. Take prior to dental     carvedilol (COREG) 25 MG tablet TAKE 1 TABLET BY MOUTH TWICE A DAY WITH FOOD 180 tablet 0   Cholecalciferol (VITAMIN D) 1000 UNITS capsule Take 1 capsule (1,000 Units total) by mouth 2 (two) times daily. 180 capsule 3   diphenhydrAMINE (BENADRYL) 25 mg capsule Take 25 mg  by mouth every 6 (six) hours as needed. Patient taking to help sleep     furosemide (LASIX) 20 MG tablet TAKE 1 TABLET BY MOUTH EVERY DAY 90 tablet 3   Omega-3 Fatty Acids (FISH OIL) 1000 MG CAPS Take 2,000 mg by mouth daily.     potassium chloride SA (KLOR-CON M20) 20 MEQ tablet TAKE 1 TABLET BY MOUTH EVERY DAY 90 tablet 2   Psyllium (METAMUCIL PO) Take 1 Dose by mouth as directed.      spironolactone (ALDACTONE) 25 MG tablet TAKE 1 TABLET (25 MG TOTAL) BY MOUTH DAILY. 90 tablet 2   No current facility-administered medications on file prior to visit.    Review of Systems  Constitutional:  Negative for activity change, appetite change, fatigue, fever and unexpected weight change.  HENT:  Negative for congestion, ear pain, rhinorrhea, sinus pressure and sore throat.   Eyes:  Negative for pain, redness and visual disturbance.  Respiratory:  Negative for cough, shortness of breath and wheezing.   Cardiovascular:  Negative for chest pain and palpitations.  Gastrointestinal:  Negative for abdominal pain, blood in stool, constipation and diarrhea.  Endocrine: Negative for polydipsia and polyuria.  Genitourinary:  Negative for dysuria, frequency and urgency.  Musculoskeletal:  Positive for arthralgias. Negative for back pain and myalgias.  Skin:  Negative for pallor and rash.  Allergic/Immunologic: Negative for environmental allergies.  Neurological:  Negative for dizziness, syncope  and headaches.  Hematological:  Negative for adenopathy. Does not bruise/bleed easily.  Psychiatric/Behavioral:  Positive for sleep disturbance. Negative for decreased concentration and dysphoric mood. The patient is not nervous/anxious.        Objective:   Physical Exam Constitutional:      General: She is not in acute distress.    Appearance: Normal appearance. She is well-developed. She is obese. She is not ill-appearing or diaphoretic.  HENT:     Head: Normocephalic and atraumatic.     Right Ear: Tympanic  membrane, ear canal and external ear normal.     Left Ear: Tympanic membrane, ear canal and external ear normal.     Nose: Nose normal. No congestion.     Mouth/Throat:     Mouth: Mucous membranes are moist.     Pharynx: Oropharynx is clear. No posterior oropharyngeal erythema.  Eyes:     General: No scleral icterus.    Extraocular Movements: Extraocular movements intact.     Conjunctiva/sclera: Conjunctivae normal.     Pupils: Pupils are equal, round, and reactive to light.  Neck:     Thyroid: No thyromegaly.     Vascular: No carotid bruit or JVD.  Cardiovascular:     Rate and Rhythm: Normal rate and regular rhythm.     Pulses: Normal pulses.     Heart sounds: Normal heart sounds.     No gallop.  Pulmonary:     Effort: Pulmonary effort is normal. No respiratory distress.     Breath sounds: Normal breath sounds. No wheezing.     Comments: Good air exch Chest:     Chest wall: No tenderness.  Abdominal:     General: Bowel sounds are normal. There is no distension or abdominal bruit.     Palpations: Abdomen is soft. There is no mass.     Tenderness: There is no abdominal tenderness.     Hernia: No hernia is present.  Genitourinary:    Comments: Declines breast exam Musculoskeletal:        General: No tenderness. Normal range of motion.     Cervical back: Normal range of motion and neck supple. No rigidity. No muscular tenderness.     Right lower leg: No edema.     Left lower leg: No edema.     Comments: No kyphosis   Lymphadenopathy:     Cervical: No cervical adenopathy.  Skin:    General: Skin is warm and dry.     Coloration: Skin is not pale.     Findings: No erythema or rash.     Comments: Solar lentigines diffusely  Sks scattered   Neurological:     Mental Status: She is alert. Mental status is at baseline.     Cranial Nerves: No cranial nerve deficit.     Motor: No abnormal muscle tone.     Coordination: Coordination normal.     Gait: Gait normal.     Deep  Tendon Reflexes: Reflexes are normal and symmetric. Reflexes normal.  Psychiatric:        Mood and Affect: Mood normal.        Cognition and Memory: Cognition and memory normal.           Assessment & Plan:   Problem List Items Addressed This Visit       Cardiovascular and Mediastinum   Internal hemorrhoids   Refilled anusol hc cream for prn use        Essential hypertension - Primary  bp in fair control at this time  BP Readings from Last 1 Encounters:  06/25/23 (!) 141/68   No changes needed Most recent labs reviewed  Disc lifstyle change with low sodium diet and exercise  Plan to continue Spironolactone 25 mg daily  Lasix 20 mg daily  Coreg 25 mg bid  K level is normal Continues cardiology follow up for DD      Chronic diastolic heart failure (HCC)   Continues cardiology care  Spironolactone 25 mg daily  Lasix 20 mg daily  Coreg 25 mg bid  No symptoms today         Other   Vitamin D deficiency   Last vitamin D Lab Results  Component Value Date   VD25OH 49.54 06/18/2023   Vitamin D level is therapeutic with current supplementation Disc importance of this to bone and overall health       Prediabetes   Lab Results  Component Value Date   HGBA1C 6.0 06/18/2023   HGBA1C 5.7 06/12/2022   HGBA1C 6.4 05/17/2021   disc imp of low glycemic diet and wt loss to prevent DM2  Handouts given       Hyperlipidemia   Disc goals for lipids and reasons to control them Rev last labs with pt Rev low sat fat diet in detail  LDL stable at 124 Diet controlled Also takes fish oil

## 2023-06-25 NOTE — Patient Instructions (Addendum)
Any exercise would help your help  Try and move more  Add some strength training to your routine, this is important for bone and brain health and can reduce your risk of falls and help your body use insulin properly and regulate weight  Light weights, exercise bands , and internet videos are a good way to start  Yoga (chair or regular), machines , floor exercises or a gym with machines are also good options    You are prediabetic Try to get most of your carbohydrates from produce (with the exception of white potatoes) and whole grains Eat less bread/pasta/rice/snack foods/cereals/sweets and other items from the middle of the grocery store (processed carbs)  Take care of yourself

## 2023-08-24 ENCOUNTER — Other Ambulatory Visit: Payer: Self-pay | Admitting: Family Medicine

## 2023-09-03 ENCOUNTER — Other Ambulatory Visit: Payer: Self-pay | Admitting: Cardiovascular Disease

## 2023-09-05 ENCOUNTER — Encounter: Payer: Self-pay | Admitting: Cardiovascular Disease

## 2023-09-05 ENCOUNTER — Ambulatory Visit (HOSPITAL_COMMUNITY): Payer: Medicare Other | Attending: Cardiology

## 2023-09-05 ENCOUNTER — Ambulatory Visit (INDEPENDENT_AMBULATORY_CARE_PROVIDER_SITE_OTHER): Payer: Medicare Other | Admitting: Cardiovascular Disease

## 2023-09-05 VITALS — BP 140/82 | HR 47 | Ht 65.0 in | Wt 199.4 lb

## 2023-09-05 DIAGNOSIS — I10 Essential (primary) hypertension: Secondary | ICD-10-CM | POA: Insufficient documentation

## 2023-09-05 DIAGNOSIS — I5032 Chronic diastolic (congestive) heart failure: Secondary | ICD-10-CM | POA: Insufficient documentation

## 2023-09-05 DIAGNOSIS — I359 Nonrheumatic aortic valve disorder, unspecified: Secondary | ICD-10-CM | POA: Insufficient documentation

## 2023-09-05 LAB — ECHOCARDIOGRAM COMPLETE
AV Mean grad: 15.5 mmHg
AV Peak grad: 26.1 mmHg
Ao pk vel: 2.55 m/s
Area-P 1/2: 3.31 cm2
S' Lateral: 2.8 cm

## 2023-09-05 MED ORDER — POTASSIUM CHLORIDE CRYS ER 20 MEQ PO TBCR: 20.0000 meq | EXTENDED_RELEASE_TABLET | Freq: Every day | ORAL | 3 refills | Status: AC

## 2023-09-05 MED ORDER — FUROSEMIDE 20 MG PO TABS: 20.0000 mg | ORAL_TABLET | Freq: Every day | ORAL | 3 refills | Status: AC

## 2023-09-05 MED ORDER — SPIRONOLACTONE 25 MG PO TABS: 25.0000 mg | ORAL_TABLET | Freq: Every day | ORAL | 3 refills | Status: AC

## 2023-09-05 NOTE — Assessment & Plan Note (Signed)
 Blood pressure controlled on carvedilol, furosemide, and spironolactone

## 2023-09-05 NOTE — Progress Notes (Signed)
 Cardiology Office Note:    Date:  09/05/2023   ID:  [name], DOB Jul 23, 1933, MRN [id number]  PCP:  Judy Pimple, MD   [facility] Providers Cardiologist:  [name], MD     Referring MD: Judy Pimple, MD   Chief Complaint  Patient presents with   Follow-up    S/P aortic valve replacement    History of Present Illness:    [name] is a 89 y.o. female with a hx of:  Aortic stenosis Status post bioprosthetic AVR 05/2011 Pt DC'd ASA 2/2 to dizziness  Chronic diastolic CHF Breast cancer Endometrial cancer Hypertension Hyperlipidemia Leg edema  The patient is here alone today. Today, she denies symptoms of chest pain, shortness of breath, orthopnea, PND, lower extremity edema, dizziness, or syncope. She occasionally wakes up with palpitations but they resolve quickly on their own. She is walking with a walker now. She is unable to stand for a long time because her lower body gets weak.   Current Medications: Current Meds  Medication Sig   amoxicillin (AMOXIL) 500 MG capsule Take 2,000 mg by mouth daily. Take prior to dental   carvedilol (COREG) 25 MG tablet TAKE 1 TABLET BY MOUTH TWICE A DAY WITH FOOD   Cholecalciferol (VITAMIN D) 1000 UNITS capsule Take 1 capsule (1,000 Units total) by mouth 2 (two) times daily.   diphenhydrAMINE (BENADRYL) 25 mg capsule Take 25 mg by mouth every 6 (six) hours as needed. Patient taking to help sleep   hydrocortisone (ANUSOL-HC) 2.5 % rectal cream Place 1 Application rectally 2 (two) times daily as needed for hemorrhoids or anal itching.   Omega-3 Fatty Acids (FISH OIL) 1000 MG CAPS Take 2,000 mg by mouth daily.   Psyllium (METAMUCIL PO) Take 1 Dose by mouth as directed.    [DISCONTINUED] furosemide (LASIX) 20 MG tablet TAKE 1 TABLET BY MOUTH EVERY DAY   [DISCONTINUED] potassium chloride SA (KLOR-CON M20) 20 MEQ tablet TAKE 1 TABLET BY MOUTH EVERY DAY   [DISCONTINUED] spironolactone (ALDACTONE) 25 MG tablet TAKE 1  TABLET (25 MG TOTAL) BY MOUTH DAILY.     Allergies:   Aspirin, Calcium, Diovan [valsartan], Ezetimibe, Ramipril, and Simvastatin   ROS:   Please see the history of present illness.    All other systems reviewed and are negative.  EKGs/Labs/Other Studies Reviewed:    The following studies were reviewed today: Cardiac Studies & Procedures   ______________________________________________________________________________________________     ECHOCARDIOGRAM  ECHOCARDIOGRAM COMPLETE 09/05/2023  Narrative ECHOCARDIOGRAM REPORT    Patient Name:   MELISSIA [name]  Date of Exam: 09/05/2023 Medical Rec #:  [id number]     Height:       64.5 in Accession #:    [id number]    Weight:       206.0 lb Date of Birth:  1933/09/18     BSA:          1.992 m Patient Age:    89 years      BP:           141/68 mmHg Patient Gender: F             HR:           49 bpm. Exam Location:  [address]  Procedure: 2D Echo, Cardiac Doppler and Color Doppler (Both Spectral and Color Flow Doppler were utilized during procedure).  Indications:    I35.9 Aortic Valve Disorder I50.32 Chronic diastolic heart failure  History:  Patient has prior history of Echocardiogram examinations, most recent 09/06/2022. Bioprosthetic AVR-21 mm Magna Ease., Signs/Symptoms:Murmur; Risk Factors:Hypertension, Dyslipidemia and Former Smoker.  Sonographer:    Sedonia Small Rodgers-Jones RDCS Referring Phys: [id number] [address] Fatima  IMPRESSIONS   1. Left ventricular ejection fraction, by estimation, is 60 to 65%. The left ventricle has normal function. The left ventricle has no regional wall motion abnormalities. There is mild concentric left ventricular hypertrophy. Left ventricular diastolic parameters are consistent with Grade II diastolic dysfunction (pseudonormalization). 2. Right ventricular systolic function is normal. The right ventricular size is normal. 3. Left atrial size was moderately dilated. 4. The mitral valve is  grossly normal. No evidence of mitral valve regurgitation. 5. Tricuspid valve regurgitation is mild to moderate. 6. S/p AVR bioprosthesis 21 mm Magna Ease. DI 0.41. The aortic valve was not well visualized. Aortic valve regurgitation is not visualized. Echo findings are consistent with normal structure and function of the aortic valve prosthesis. Aortic valve mean gradient measures 15.5 mmHg. Aortic valve Vmax measures 2.55 m/s. 7. Cannot exclude a small PFO.  Comparison(s): No significant change from prior study.  FINDINGS Left Ventricle: Left ventricular ejection fraction, by estimation, is 60 to 65%. The left ventricle has normal function. The left ventricle has no regional wall motion abnormalities. The left ventricular internal cavity size was normal in size. There is mild concentric left ventricular hypertrophy. Left ventricular diastolic parameters are consistent with Grade II diastolic dysfunction (pseudonormalization).  Right Ventricle: The right ventricular size is normal. Right ventricular systolic function is normal.  Left Atrium: Left atrial size was moderately dilated.  Right Atrium: Right atrial size was normal in size.  Pericardium: There is no evidence of pericardial effusion.  Mitral Valve: The mitral valve is grossly normal. No evidence of mitral valve regurgitation.  Tricuspid Valve: Tricuspid valve regurgitation is mild to moderate.  S/p AVR bioprosthesis 21 mm Magna Ease. DI 0.41. The aortic valve was not well visualized. Aortic valve regurgitation is not visualized. Echo findings are consistent with normal structure and function of the aortic valve prosthesis. Pulmonic Valve: Pulmonic valve regurgitation is trivial.  Aorta: The aortic root and ascending aorta are structurally normal, with no evidence of dilitation.  IAS/Shunts: Cannot exclude a small PFO.   LEFT VENTRICLE PLAX 2D LVIDd:         4.40 cm Diastology LVIDs:         2.80 cm LV e' medial:    6.80  cm/s LV PW:         1.20 cm LV E/e' medial:  16.5 LV IVS:        1.20 cm LV e' lateral:   9.62 cm/s LV E/e' lateral: 11.6   RIGHT VENTRICLE             IVC RV Basal diam:  4.10 cm     IVC diam: 2.10 cm RV S prime:     12.60 cm/s TAPSE (M-mode): 2.3 cm  LEFT ATRIUM             Index        RIGHT ATRIUM           Index LA diam:        4.90 cm 2.46 cm/m   RA Area:     18.70 cm LA Vol (A2C):   63.0 ml 31.63 ml/m  RA Volume:   55.50 ml  27.87 ml/m LA Vol (A4C):   81.5 ml 40.92 ml/m LA Biplane Vol: 71.7 ml 36.00 ml/m AORTIC VALVE  AV Vmax:           255.25 cm/s AV Vmean:          187.000 cm/s AV VTI:            0.723 m AV Peak Grad:      26.1 mmHg AV Mean Grad:      15.5 mmHg LVOT Vmax:         101.00 cm/s LVOT Vmean:        70.300 cm/s LVOT VTI:          0.299 m LVOT/AV VTI ratio: 0.41  AORTA Ao Root diam: 2.70 cm Ao Asc diam:  3.40 cm  MITRAL VALVE                TRICUSPID VALVE MV Area (PHT): 3.31 cm     TR Peak grad:   37.9 mmHg MV Decel Time: 229 msec     TR Vmax:        308.00 cm/s MV E velocity: 112.00 cm/s MV A velocity: 104.00 cm/s  SHUNTS MV E/A ratio:  1.08         Systemic VTI: 0.30 m  Carolan Clines Electronically signed by Carolan Clines Signature Date/Time: 09/05/2023/2:46:23 PM    Final          ______________________________________________________________________________________________      EKG:   EKG Interpretation Date/Time:  Thursday September 05 2023 14:54:02 EDT Ventricular Rate:  47 PR Interval:  184 QRS Duration:  124 QT Interval:  484 QTC Calculation: 428 R Axis:   -55  Text Interpretation: Sinus bradycardia Left anterior fascicular block ) When compared with ECG of 06-Mar-2017 23:39, No significant change was found Confirmed by [name] [phone]) on 09/05/2023 3:06:47 PM    Recent Labs: 06/18/2023: ALT 23; BUN 22; Creatinine, Ser 0.81; Hemoglobin 15.6; Platelets 125.0; Potassium 4.4; Sodium 143; TSH 2.83  Recent Lipid Panel     Component Value Date/Time   CHOL 198 06/18/2023 0841   CHOL 185 11/11/2017 1114   TRIG 85.0 06/18/2023 0841   TRIG 58 05/13/2006 0933   HDL 57.10 06/18/2023 0841   HDL 54 11/11/2017 1114   CHOLHDL 3 06/18/2023 0841   VLDL 17.0 06/18/2023 0841   LDLCALC 124 (H) 06/18/2023 0841   LDLCALC 111 (H) 11/11/2017 1114   LDLDIRECT 162.4 03/18/2012 0900         Physical Exam:    VS:  BP (!) 140/82   Pulse (!) 47   Ht 5\' 5"  (1.651 m)   Wt 199 lb 6.4 oz (90.4 kg)   LMP 05/29/2007   SpO2 97%   BMI 33.18 kg/m     Wt Readings from Last 3 Encounters:  09/05/23 199 lb 6.4 oz (90.4 kg)  06/25/23 206 lb (93.4 kg)  05/07/23 200 lb (90.7 kg)     GEN:  Well nourished, well developed elderly woman in no acute distress HEENT: Normal NECK: No JVD; BL carotid bruits likely radiation of murmur LYMPHATICS: No lymphadenopathy CARDIAC: RRR, 2/6 systolic murmur at the RUSB, loudest at the LLSB RESPIRATORY:  Clear to auscultation without rales, wheezing or rhonchi  ABDOMEN: Soft, non-tender, non-distended MUSCULOSKELETAL:  1+ bilateral ankle edema; No deformity  SKIN: Warm and dry NEUROLOGIC:  Alert and oriented x 3 PSYCHIATRIC:  Normal affect   Assessment & Plan Aortic valve disorder Patient 12 years out from bioprosthetic aortic valve replacement.  Today's echo is reviewed and shows normal LVEF of 60 to 65% with mild LVH, normal RV  function, and normal function of her aortic valve bioprosthesis with a mean gradient of 16 mmHg and no valvular or paravalvular regurgitation.  Continue annual follow-up. Essential hypertension Blood pressure controlled on carvedilol, furosemide, and spironolactone Chronic diastolic heart failure (HCC) Stable with NYHA functional class I symptoms at present.  Primarily limited by gait instability with no cardiopulmonary symptoms.     Medication Adjustments/Labs and Tests Ordered: Current medicines are reviewed at length with the patient today.  Concerns regarding  medicines are outlined above.  Orders Placed This Encounter  Procedures   EKG 12-Lead   Meds ordered this encounter  Medications   furosemide (LASIX) 20 MG tablet    Sig: Take 1 tablet (20 mg total) by mouth daily.    Dispense:  90 tablet    Refill:  3   potassium chloride SA (KLOR-CON M20) 20 MEQ tablet    Sig: Take 1 tablet (20 mEq total) by mouth daily.    Dispense:  90 tablet    Refill:  3   spironolactone (ALDACTONE) 25 MG tablet    Sig: Take 1 tablet (25 mg total) by mouth daily.    Dispense:  90 tablet    Refill:  3    Patient Instructions  Testing/Procedures: ECHO (same day in 1 year) Your physician has requested that you have an echocardiogram. Echocardiography is a painless test that uses sound waves to create images of your heart. It provides your doctor with information about the size and shape of your heart and how well your heart's chambers and valves are working. This procedure takes approximately one hour. There are no restrictions for this procedure. Please do NOT wear cologne, perfume, aftershave, or lotions (deodorant is allowed). Please arrive 15 minutes prior to your appointment time.  Please note: We ask at that you not bring children with you during ultrasound (echo/ vascular) testing. Due to room size and safety concerns, children are not allowed in the ultrasound rooms during exams. Our front office staff cannot provide observation of children in our lobby area while testing is being conducted. An adult accompanying a patient to their appointment will only be allowed in the ultrasound room at the discretion of the ultrasound technician under special circumstances. We apologize for any inconvenience.  Follow-Up: At [facility], you and your health needs are our priority.  As part of our continuing mission to provide you with exceptional heart care, our providers are all part of one team.  This team includes your primary Cardiologist (physician) and  Advanced Practice Providers or APPs (Physician Assistants and Nurse Practitioners) who all work together to provide you with the care you need, when you need it.  Your next appointment:   1 year(s)  Provider:   [name], MD        1st Floor: - Lobby - Registration  - Pharmacy  - Lab - Cafe  2nd Floor: - PV Lab - Diagnostic Testing (echo, CT, nuclear med)  3rd Floor: - Vacant  4th Floor: - TCTS (cardiothoracic surgery) - AFib Clinic - Structural Heart Clinic - Vascular Surgery  - Vascular Ultrasound  5th Floor: - HeartCare Cardiology (general and EP) - Clinical Pharmacy for coumadin, hypertension, lipid, weight-loss medications, and med management appointments    Valet parking services will be available as well.     Signed, [name], MD  09/05/2023 4:40 PM    [facility]

## 2023-09-05 NOTE — Assessment & Plan Note (Signed)
 Patient 12 years out from bioprosthetic aortic valve replacement.  Today's echo is reviewed and shows normal LVEF of 60 to 65% with mild LVH, normal RV function, and normal function of her aortic valve bioprosthesis with a mean gradient of 16 mmHg and no valvular or paravalvular regurgitation.  Continue annual follow-up.

## 2023-09-05 NOTE — Assessment & Plan Note (Signed)
 Stable with NYHA functional class I symptoms at present.  Primarily limited by gait instability with no cardiopulmonary symptoms.

## 2023-09-05 NOTE — Patient Instructions (Signed)
 Testing/Procedures: ECHO (same day in 1 year) Your physician has requested that you have an echocardiogram. Echocardiography is a painless test that uses sound waves to create images of your heart. It provides your doctor with information about the size and shape of your heart and how well your heart's chambers and valves are working. This procedure takes approximately one hour. There are no restrictions for this procedure. Please do NOT wear cologne, perfume, aftershave, or lotions (deodorant is allowed). Please arrive 15 minutes prior to your appointment time.  Please note: We ask at that you not bring children with you during ultrasound (echo/ vascular) testing. Due to room size and safety concerns, children are not allowed in the ultrasound rooms during exams. Our front office staff cannot provide observation of children in our lobby area while testing is being conducted. An adult accompanying a patient to their appointment will only be allowed in the ultrasound room at the discretion of the ultrasound technician under special circumstances. We apologize for any inconvenience.  Follow-Up: At [facility], you and your health needs are our priority.  As part of our continuing mission to provide you with exceptional heart care, our providers are all part of one team.  This team includes your primary Cardiologist (physician) and Advanced Practice Providers or APPs (Physician Assistants and Nurse Practitioners) who all work together to provide you with the care you need, when you need it.  Your next appointment:   1 year(s)  Provider:   [name], MD        1st Floor: - Lobby - Registration  - Pharmacy  - Lab - Cafe  2nd Floor: - PV Lab - Diagnostic Testing (echo, CT, nuclear med)  3rd Floor: - Vacant  4th Floor: - TCTS (cardiothoracic surgery) - AFib Clinic - Structural Heart Clinic - Vascular Surgery  - Vascular Ultrasound  5th Floor: - [facility] Cardiology  (general and EP) - Clinical Pharmacy for coumadin, hypertension, lipid, weight-loss medications, and med management appointments    Valet parking services will be available as well.

## 2023-11-02 ENCOUNTER — Other Ambulatory Visit: Payer: Self-pay | Admitting: Family Medicine

## 2023-12-19 DIAGNOSIS — H903 Sensorineural hearing loss, bilateral: Secondary | ICD-10-CM | POA: Diagnosis not present

## 2023-12-24 ENCOUNTER — Ambulatory Visit (INDEPENDENT_AMBULATORY_CARE_PROVIDER_SITE_OTHER): Admitting: Internal Medicine

## 2023-12-24 ENCOUNTER — Encounter: Payer: Self-pay | Admitting: Internal Medicine

## 2023-12-24 VITALS — BP 138/76 | HR 49 | Temp 98.4°F | Ht 65.0 in | Wt 197.0 lb

## 2023-12-24 DIAGNOSIS — J029 Acute pharyngitis, unspecified: Secondary | ICD-10-CM | POA: Diagnosis not present

## 2023-12-24 LAB — POCT RAPID STREP A (OFFICE): Rapid Strep A Screen: NEGATIVE

## 2023-12-24 NOTE — Assessment & Plan Note (Signed)
 Strep negative Could be COVID but already improving so wouldn't treat with paxlovid Discussed tylenol  prn She should contact us  if things change or worsen

## 2023-12-24 NOTE — Progress Notes (Signed)
 Subjective:    Patient ID: [name], female    DOB: 1933-10-16, 89 y.o.   MRN: [id number]  HPI Here due to sore throat  Started 4 days ago Gradually worsened with sore throat Especially bad when swallowing Yesterday got bad enough to prompt her to make the appointment No clear fever No cough Chronic rhinorrhea--without change No SOB  No rx Did start to feel better today  Current Outpatient Medications on File Prior to Visit  Medication Sig Dispense Refill   amoxicillin (AMOXIL) 500 MG capsule Take 2,000 mg by mouth daily. Take prior to dental     carvedilol  (COREG ) 25 MG tablet TAKE 1 TABLET BY MOUTH TWICE A DAY WITH FOOD 180 tablet 0   Cholecalciferol (VITAMIN D ) 1000 UNITS capsule Take 1 capsule (1,000 Units total) by mouth 2 (two) times daily. 180 capsule 3   diphenhydrAMINE (BENADRYL) 25 mg capsule Take 25 mg by mouth every 6 (six) hours as needed. Patient taking to help sleep     furosemide  (LASIX ) 20 MG tablet Take 1 tablet (20 mg total) by mouth daily. 90 tablet 3   hydrocortisone  (ANUSOL -HC) 2.5 % rectal cream Place 1 Application rectally 2 (two) times daily as needed for hemorrhoids or anal itching. 30 g 1   Omega-3 Fatty Acids (FISH OIL) 1000 MG CAPS Take 2,000 mg by mouth daily.     potassium chloride  SA (KLOR-CON  M20) 20 MEQ tablet Take 1 tablet (20 mEq total) by mouth daily. 90 tablet 3   Psyllium (METAMUCIL PO) Take 1 Dose by mouth as directed.      spironolactone  (ALDACTONE ) 25 MG tablet Take 1 tablet (25 mg total) by mouth daily. 90 tablet 3   No current facility-administered medications on file prior to visit.    Allergies  Allergen Reactions   Aspirin  Other (See Comments)    dizzy   Calcium     REACTION: constipation   Diovan [Valsartan]     REACTION: abdominal pain   Ezetimibe     REACTION: fatigue   Ramipril     REACTION: abdominal pain   Simvastatin     REACTION: leg pain and body aches    Past Medical History:  Diagnosis Date   Alcohol  abuse, unspecified    Aortic stenosis    s/p tissue AVR 05/2011   Arthritis    Breast CA (HCC)    rt   Diaphragmatic hernia without mention of obstruction or gangrene    Diverticulosis of colon (without mention of hemorrhage)    Dyskinesia of esophagus    Endometrial cancer (HCC)    Family history of colonic polyps    Heart murmur    HLD (hyperlipidemia)    HTN (hypertension)    Internal hemorrhoids without mention of complication    Malignant neoplasm of corpus uteri, except isthmus (HCC)    Other abnormal glucose    Pain in joint, shoulder region    Postmenopausal bleeding    Tobacco use disorder    Unspecified hearing loss    Vertigo     Past Surgical History:  Procedure Laterality Date   AORTIC VALVE REPLACEMENT  06/13/2011   Procedure: AORTIC VALVE REPLACEMENT (AVR);  Surgeon: Dorise MARLA Fellers, MD;  Location: [facility] OR;  Service: Open Heart Surgery;  Laterality: N/A;   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   BREAST LUMPECTOMY  11/2009   DCIS, neg sentinel lymph node biopsy   CATARACT EXTRACTION     bil  LAPAROSCOPY W/ TOTAL BILATERAL PELVIC AND PERI-AORTIC LYMPHADENECTOMY  11/04/2007   PARAAORTIC LYMPHADENECTOMY     ROTATOR CUFF REPAIR  05/2008   right   TOTAL ABDOMINAL HYSTERECTOMY W/ BILATERAL SALPINGOOPHORECTOMY  11/04/07   salpingo-oophrectomy    Family History  Problem Relation Age of Onset   Colon polyps Sister        2010   Heart disease Mother    Heart attack Mother    Stroke Father    Heart failure Father    Diabetes type II Father    Diabetes type II Brother    Cancer Maternal Aunt        possible post menopausal breast cancer    Social History   Socioeconomic History   Marital status: Widowed    Spouse name: Not on file   Number of children: 2   Years of education: Not on file   Highest education level: High school graduate  Occupational History   Not on file  Tobacco Use   Smoking status: Former    Current packs/day: 0.00    Types: Cigarettes    Quit  date: 03/28/2000    Years since quitting: 23.7   Smokeless tobacco: Never  Vaping Use   Vaping status: Never Used  Substance and Sexual Activity   Alcohol use: Not Currently    Alcohol/week: 0.0 standard drinks of alcohol   Drug use: Never   Sexual activity: Not Currently  Other Topics Concern   Not on file  Social History Narrative   Lives at home alone   Right handed   Caffeine: 1-2 cups daily   Social Drivers of Health   Financial Resource Strain: Low Risk  (05/07/2023)   Overall Financial Resource Strain (CARDIA)    Difficulty of Paying Living Expenses: Not hard at all  Food Insecurity: No Food Insecurity (05/07/2023)   Hunger Vital Sign    Worried About Running Out of Food in the Last Year: Never true    Ran Out of Food in the Last Year: Never true  Transportation Needs: No Transportation Needs (05/07/2023)   PRAPARE - Administrator, Civil Service (Medical): No    Lack of Transportation (Non-Medical): No  Physical Activity: Inactive (05/07/2023)   Exercise Vital Sign    Days of Exercise per Week: 0 days    Minutes of Exercise per Session: 0 min  Stress: No Stress Concern Present (05/07/2023)   [employer] of Occupational Health - Occupational Stress Questionnaire    Feeling of Stress : Not at all  Social Connections: Socially Isolated (05/07/2023)   Social Connection and Isolation Panel    Frequency of Communication with Friends and Family: More than three times a week    Frequency of Social Gatherings with Friends and Family: More than three times a week    Attends Religious Services: Never    Database administrator or Organizations: No    Attends Banker Meetings: Never    Marital Status: Widowed  Intimate Partner Violence: Not At Risk (05/07/2023)   Humiliation, Afraid, Rape, and Kick questionnaire    Fear of Current or Ex-Partner: No    Emotionally Abused: No    Physically Abused: No    Sexually Abused: No   Review of  Systems No N/V No rash Eating okay--just hurts     Objective:   Physical Exam Constitutional:      Appearance: She is well-developed.  HENT:     Mouth/Throat:  Comments: Mild pharyngeal injection without exudates or tonsillar enlargement Pulmonary:     Effort: Pulmonary effort is normal.     Breath sounds: Normal breath sounds. No wheezing or rales.  Musculoskeletal:     Cervical back: Neck supple.  Lymphadenopathy:     Cervical: No cervical adenopathy.  Neurological:     Mental Status: She is alert.            Assessment & Plan:

## 2024-02-27 ENCOUNTER — Other Ambulatory Visit: Payer: Self-pay | Admitting: Family Medicine

## 2024-02-29 DIAGNOSIS — Z23 Encounter for immunization: Secondary | ICD-10-CM | POA: Diagnosis not present

## 2024-03-17 DIAGNOSIS — I8312 Varicose veins of left lower extremity with inflammation: Secondary | ICD-10-CM | POA: Diagnosis not present

## 2024-03-17 DIAGNOSIS — I8311 Varicose veins of right lower extremity with inflammation: Secondary | ICD-10-CM | POA: Diagnosis not present

## 2024-03-17 DIAGNOSIS — L304 Erythema intertrigo: Secondary | ICD-10-CM | POA: Diagnosis not present

## 2024-03-17 DIAGNOSIS — L98499 Non-pressure chronic ulcer of skin of other sites with unspecified severity: Secondary | ICD-10-CM | POA: Diagnosis not present

## 2024-03-17 DIAGNOSIS — I872 Venous insufficiency (chronic) (peripheral): Secondary | ICD-10-CM | POA: Diagnosis not present

## 2024-03-17 DIAGNOSIS — L821 Other seborrheic keratosis: Secondary | ICD-10-CM | POA: Diagnosis not present

## 2024-03-17 DIAGNOSIS — L738 Other specified follicular disorders: Secondary | ICD-10-CM | POA: Diagnosis not present

## 2024-05-07 ENCOUNTER — Ambulatory Visit: Payer: Medicare Other

## 2024-05-07 VITALS — Ht 65.0 in | Wt 197.0 lb

## 2024-05-07 DIAGNOSIS — Z0000 Encounter for general adult medical examination without abnormal findings: Secondary | ICD-10-CM | POA: Diagnosis not present

## 2024-05-07 NOTE — Progress Notes (Signed)
 Please attest and cosign this visit due to patients primary care provider not being in the office at the time the visit was completed.     Chief Complaint  Patient presents with   Medicare Wellness    Subjective:   [name] is a 89 y.o. female who presents for a Medicare Annual Wellness Visit.  Visit info / Clinical Intake: Medicare Wellness Visit Type:: Subsequent Annual Wellness Visit Persons participating in visit and providing information:: patient Medicare Wellness Visit Mode:: Telephone If telephone:: video declined Since this visit was completed virtually, some vitals may be partially provided or unavailable. Missing vitals are due to the limitations of the virtual format.: Unable to obtain vitals - no equipment If Telephone or Video please confirm:: I connected with patient using audio/video enable telemedicine. I verified patient identity with two identifiers, discussed telehealth limitations, and patient agreed to proceed. Patient Location:: home Provider Location:: clinic Interpreter Needed?: No Pre-visit prep was completed: yes AWV questionnaire completed by patient prior to visit?: no Living arrangements:: (!) lives alone Patient's Overall Health Status Rating: good Typical amount of pain: none Does pain affect daily life?: no Are you currently prescribed opioids?: no  Dietary Habits and Nutritional Risks How many meals a day?: 3 Eats fruit and vegetables daily?: yes Most meals are obtained by: preparing own meals In the last 2 weeks, have you had any of the following?: none Diabetic:: no  Functional Status Activities of Daily Living (to include ambulation/medication): Independent Ambulation: Independent with device- listed below Home Assistive Devices/Equipment: Walker (specify Type) Medication Administration: Independent Home Management (perform basic housework or laundry): Independent Manage your own finances?: yes Primary transportation is:  driving Concerns about vision?: no *vision screening is required for WTM* Concerns about hearing?: (!) yes Uses hearing aids?: (!) yes Hear whispered voice?: (!) no *in-person visit only*  Fall Screening Falls in the past year?: 0 Number of falls in past year: 0 Was there an injury with Fall?: 0 Fall Risk Category Calculator: 0 Patient Fall Risk Level: Low Fall Risk  Fall Risk Patient at Risk for Falls Due to: Impaired mobility; Impaired balance/gait Fall risk Follow up: Education provided; Falls evaluation completed; Falls prevention discussed  Home and Transportation Safety: All rugs have non-skid backing?: N/A, no rugs All stairs or steps have railings?: yes Grab bars in the bathtub or shower?: yes Have non-skid surface in bathtub or shower?: yes Good home lighting?: yes Regular seat belt use?: yes Hospital stays in the last year:: no  Cognitive Assessment Difficulty concentrating, remembering, or making decisions? : yes Will 6CIT or Mini Cog be Completed: yes What year is it?: 0 points What month is it?: 0 points Give patient an address phrase to remember (5 components): [address] [place]  About what time is it?: 0 points Count backwards from 20 to 1: 0 points Say the months of the year in reverse: 0 points Repeat the address phrase from earlier: 0 points 6 CIT Score: 0 points  Advance Directives (For Healthcare) Does Patient Have a Medical Advance Directive?: Yes Does patient want to make changes to medical advance directive?: No - Patient declined Type of Advance Directive: Healthcare Power of [place]; Living will Copy of Healthcare Power of Attorney in Chart?: Yes - validated most recent copy scanned in chart (See row information) Copy of Living Will in Chart?: Yes - validated most recent copy scanned in chart (See row information)  Reviewed/Updated  Reviewed/Updated: Reviewed All (Medical, Surgical, Family, Medications, Allergies, Care  Teams,  Patient Goals)    Allergies (verified) Aspirin , Calcium, Diovan [valsartan], Ezetimibe, Ramipril, and Simvastatin   Current Medications (verified) Outpatient Encounter Medications as of 05/07/2024  Medication Sig   amoxicillin (AMOXIL) 500 MG capsule Take 2,000 mg by mouth daily. Take prior to dental   carvedilol  (COREG ) 25 MG tablet TAKE 1 TABLET BY MOUTH TWICE A DAY WITH FOOD   Cholecalciferol (VITAMIN D ) 1000 UNITS capsule Take 1 capsule (1,000 Units total) by mouth 2 (two) times daily.   diphenhydrAMINE (BENADRYL) 25 mg capsule Take 25 mg by mouth every 6 (six) hours as needed. Patient taking to help sleep   furosemide  (LASIX ) 20 MG tablet Take 1 tablet (20 mg total) by mouth daily.   hydrocortisone  (ANUSOL -HC) 2.5 % rectal cream Place 1 Application rectally 2 (two) times daily as needed for hemorrhoids or anal itching.   Omega-3 Fatty Acids (FISH OIL) 1000 MG CAPS Take 2,000 mg by mouth daily.   potassium chloride  SA (KLOR-CON  M20) 20 MEQ tablet Take 1 tablet (20 mEq total) by mouth daily.   Psyllium (METAMUCIL PO) Take 1 Dose by mouth as directed.    spironolactone  (ALDACTONE ) 25 MG tablet Take 1 tablet (25 mg total) by mouth daily.   No facility-administered encounter medications on file as of 05/07/2024.    History: Past Medical History:  Diagnosis Date   Alcohol abuse, unspecified    Aortic stenosis    s/p tissue AVR 05/2011   Arthritis    Breast CA (HCC)    rt   Diaphragmatic hernia without mention of obstruction or gangrene    Diverticulosis of colon (without mention of hemorrhage)    Dyskinesia of esophagus    Endometrial cancer (HCC)    Family history of colonic polyps    Heart murmur    HLD (hyperlipidemia)    HTN (hypertension)    Internal hemorrhoids without mention of complication    Malignant neoplasm of corpus uteri, except isthmus (HCC)    Other abnormal glucose    Pain in joint, shoulder region    Postmenopausal bleeding    Tobacco use disorder     Unspecified hearing loss    Vertigo    Past Surgical History:  Procedure Laterality Date   AORTIC VALVE REPLACEMENT  06/13/2011   Procedure: AORTIC VALVE REPLACEMENT (AVR);  Surgeon: Dorise MARLA Fellers, MD;  Location: [facility] OR;  Service: Open Heart Surgery;  Laterality: N/A;   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   BREAST LUMPECTOMY  11/2009   DCIS, neg sentinel lymph node biopsy   CATARACT EXTRACTION     bil   LAPAROSCOPY W/ TOTAL BILATERAL PELVIC AND PERI-AORTIC LYMPHADENECTOMY  11/04/2007   PARAAORTIC LYMPHADENECTOMY     ROTATOR CUFF REPAIR  05/2008   right   TOTAL ABDOMINAL HYSTERECTOMY W/ BILATERAL SALPINGOOPHORECTOMY  11/04/07   salpingo-oophrectomy   Family History  Problem Relation Age of Onset   Colon polyps Sister        2010   Heart disease Mother    Heart attack Mother    Stroke Father    Heart failure Father    Diabetes type II Father    Diabetes type II Brother    Cancer Maternal Aunt        possible post menopausal breast cancer   Social History   Occupational History   Not on file  Tobacco Use   Smoking status: Former    Current packs/day: 0.00    Average packs/day: 1.0 packs/day  Types: Cigarettes    Quit date: 03/28/2000    Years since quitting: 24.1   Smokeless tobacco: Never  Vaping Use   Vaping status: Never Used  Substance and Sexual Activity   Alcohol use: Not Currently    Alcohol/week: 0.0 standard drinks of alcohol   Drug use: Never   Sexual activity: Not Currently   Tobacco Counseling Counseling given: Not Answered  SDOH Screenings   Food Insecurity: No Food Insecurity (05/07/2024)  Housing: Unknown (05/07/2024)  Transportation Needs: No Transportation Needs (05/07/2024)  Utilities: Not At Risk (05/07/2024)  Alcohol Screen: Low Risk (05/07/2023)  Depression (PHQ2-9): Low Risk (05/07/2024)  Financial Resource Strain: Low Risk (05/07/2023)  Physical Activity: Inactive (05/07/2024)  Social Connections: Socially Isolated (05/07/2024)  Stress: No  Stress Concern Present (05/07/2024)  Tobacco Use: Medium Risk (05/07/2024)  Health Literacy: Adequate Health Literacy (05/07/2024)   See flowsheets for full screening details  Depression Screen PHQ 2 & 9 Depression Scale- Over the past 2 weeks, how often have you been bothered by any of the following problems? Little interest or pleasure in doing things: 0 Feeling down, depressed, or hopeless (PHQ Adolescent also includes...irritable): 0 PHQ-2 Total Score: 0     Goals Addressed             This Visit's Progress    Maintain healthy lifestyle   On track    Stay active Stay hydrated Healthy diet     Patient Stated   On track    Starting 11/19/2018, I will continue to take medications as prescribed.     COMPLETED: Patient Stated       12/23/2019, I will maintain and continue medications as prescribed.              Objective:    Today's Vitals   05/07/24 1301  Weight: 197 lb (89.4 kg)  Height: 5' 5 (1.651 m)   Body mass index is 32.78 kg/m.  Hearing/Vision screen Vision Screening - Comments:: Not UTD w/appts to [facility] Immunizations and Health Maintenance Health Maintenance  Topic Date Due   Zoster Vaccines- Shingrix (1 of 2) 10/16/1985   COVID-19 Vaccine (1 - 2025-26 season) Never done   Medicare Annual Wellness (AWV)  05/06/2024   Mammogram  05/29/2024   DTaP/Tdap/Td (4 - Td or Tdap) 05/20/2031   Pneumococcal Vaccine: 50+ Years  Completed   Influenza Vaccine  Completed   Bone Density Scan  Completed   Meningococcal B Vaccine  Aged Out        Assessment/Plan:  This is a routine wellness examination for [name].  Patient Care Team: Tower, Laine LABOR, MD as PCP - Diedre Wonda Sharper, MD as PCP - Cardiology (Cardiology) Lucas Dorise POUR, MD (Cardiothoracic Surgery) Fredirick Glenys RAMAN, MD as Consulting Physician (Obstetrics and Gynecology) Arlana Arnt, MD as Consulting Physician (Otolaryngology) Norleen PHEBE Dimes, DDS as Consulting Physician  (Dentistry)  I have personally reviewed and noted the following in the patients chart:   Medical and social history Use of alcohol, tobacco or illicit drugs  Current medications and supplements including opioid prescriptions. Functional ability and status Nutritional status Physical activity Advanced directives List of other physicians Hospitalizations, surgeries, and ER visits in previous 12 months Vitals Screenings to include cognitive, depression, and falls Referrals and appointments  No orders of the defined types were placed in this encounter.  In addition, I have reviewed and discussed with patient certain preventive protocols, quality metrics, and best practice recommendations. A written personalized care plan for preventive services  as well as general preventive health recommendations were provided to patient.   Erminio LITTIE Saris, LPN   [id number]   No follow-ups on file.  After Visit Summary: (Declined) Due to this being a telephonic visit, with patients personalized plan was offered to patient but patient Declined AVS at this time   Nurse Notes: No voiced or noted concerns at this time Patient advised to keep follow-up appointment with PCP ([id number]) Appointment(s) made: (Lab/PCP appt Jan 26)

## 2024-05-07 NOTE — Patient Instructions (Signed)
 Ms. Gable,  Thank you for taking the time for your Medicare Wellness Visit. I appreciate your continued commitment to your health goals. Please review the care plan we discussed, and feel free to reach out if I can assist you further.  Please note that Annual Wellness Visits do not include a physical exam. Some assessments may be limited, especially if the visit was conducted virtually. If needed, we may recommend an in-person follow-up with your provider.  Ongoing Care Seeing your primary care provider every 3 to 6 months helps us  monitor your health and provide consistent, personalized care.   Referrals If a referral was made during today's visit and you haven't received any updates within two weeks, please contact the referred provider directly to check on the status.  Recommended Screenings:  Health Maintenance  Topic Date Due   Zoster (Shingles) Vaccine (1 of 2) 10/16/1985   COVID-19 Vaccine (1 - 2025-26 season) Never done   Medicare Annual Wellness Visit  05/06/2024   Breast Cancer Screening  05/29/2024   DTaP/Tdap/Td vaccine (4 - Td or Tdap) 05/20/2031   Pneumococcal Vaccine for age over 49  Completed   Flu Shot  Completed   Osteoporosis screening with Bone Density Scan  Completed   Meningitis B Vaccine  Aged Out       05/07/2024    1:07 PM  Advanced Directives  Does Patient Have a Medical Advance Directive? Yes  Type of Estate Agent of [place];Living will  Copy of Healthcare Power of Attorney in Chart? Yes - validated most recent copy scanned in chart (See row information)    Vision: Annual vision screenings are recommended for early detection of glaucoma, cataracts, and diabetic retinopathy. These exams can also reveal signs of chronic conditions such as diabetes and high blood pressure.  Dental: Annual dental screenings help detect early signs of oral cancer, gum disease, and other conditions linked to overall health, including heart disease and  diabetes.

## 2024-05-23 ENCOUNTER — Other Ambulatory Visit: Payer: Self-pay | Admitting: Family Medicine

## 2024-06-02 ENCOUNTER — Telehealth: Payer: Self-pay | Admitting: Cardiovascular Disease

## 2024-06-02 NOTE — Telephone Encounter (Signed)
" °  Patient would like to schedule her yearly echo but no order on file  "

## 2024-06-02 NOTE — Telephone Encounter (Signed)
 Pt requested to schedule her f/u with Dr Wonda as well as her ECHO. She states that last year she was able to get them done on the same day. Informed patient that the schedule wasn't out yet and she could call back at a later date. Informed her that ECHO must be schedule within a month of testing needed for insurance auth as it would expire. Verbalizes understand and states she will call back.

## 2024-06-04 ENCOUNTER — Other Ambulatory Visit: Payer: Self-pay

## 2024-06-04 DIAGNOSIS — I359 Nonrheumatic aortic valve disorder, unspecified: Secondary | ICD-10-CM

## 2024-06-05 NOTE — Telephone Encounter (Signed)
 Spoke with pt this morning and we have scheduled her echo for 4/21 at 8:15 AM and then her f/u with Dr. Wonda for the same day at 10 AM.

## 2024-06-14 ENCOUNTER — Telehealth: Payer: Self-pay | Admitting: Family Medicine

## 2024-06-14 DIAGNOSIS — E7800 Pure hypercholesterolemia, unspecified: Secondary | ICD-10-CM

## 2024-06-14 DIAGNOSIS — R7303 Prediabetes: Secondary | ICD-10-CM

## 2024-06-14 DIAGNOSIS — I10 Essential (primary) hypertension: Secondary | ICD-10-CM

## 2024-06-14 DIAGNOSIS — E559 Vitamin D deficiency, unspecified: Secondary | ICD-10-CM

## 2024-06-14 NOTE — Telephone Encounter (Signed)
-----   Message from Veva JINNY Ferrari sent at 06/04/2024  3:34 PM EST ----- Regarding: Lab orders for [facility], 1.22.26 Patient is scheduled for CPX labs, please order future labs, Thanks , Veva

## 2024-06-18 ENCOUNTER — Other Ambulatory Visit (INDEPENDENT_AMBULATORY_CARE_PROVIDER_SITE_OTHER)

## 2024-06-18 ENCOUNTER — Ambulatory Visit: Payer: Self-pay | Admitting: Family Medicine

## 2024-06-18 DIAGNOSIS — E559 Vitamin D deficiency, unspecified: Secondary | ICD-10-CM | POA: Diagnosis not present

## 2024-06-18 DIAGNOSIS — R7303 Prediabetes: Secondary | ICD-10-CM

## 2024-06-18 DIAGNOSIS — E7800 Pure hypercholesterolemia, unspecified: Secondary | ICD-10-CM | POA: Diagnosis not present

## 2024-06-18 DIAGNOSIS — I10 Essential (primary) hypertension: Secondary | ICD-10-CM

## 2024-06-18 LAB — CBC WITH DIFFERENTIAL/PLATELET
Basophils Absolute: 0 K/uL (ref 0.0–0.1)
Basophils Relative: 1 % (ref 0.0–3.0)
Eosinophils Absolute: 0.1 K/uL (ref 0.0–0.7)
Eosinophils Relative: 2.5 % (ref 0.0–5.0)
HCT: 44.5 % (ref 36.0–46.0)
Hemoglobin: 15 g/dL (ref 12.0–15.0)
Lymphocytes Relative: 21.7 % (ref 12.0–46.0)
Lymphs Abs: 1 K/uL (ref 0.7–4.0)
MCHC: 33.6 g/dL (ref 30.0–36.0)
MCV: 87.7 fl (ref 78.0–100.0)
Monocytes Absolute: 0.4 K/uL (ref 0.1–1.0)
Monocytes Relative: 9 % (ref 3.0–12.0)
Neutro Abs: 3.1 K/uL (ref 1.4–7.7)
Neutrophils Relative %: 65.8 % (ref 43.0–77.0)
Platelets: 138 K/uL — ABNORMAL LOW (ref 150.0–400.0)
RBC: 5.07 Mil/uL (ref 3.87–5.11)
RDW: 14.2 % (ref 11.5–15.5)
WBC: 4.7 K/uL (ref 4.0–10.5)

## 2024-06-18 LAB — LIPID PANEL
Cholesterol: 196 mg/dL (ref 28–200)
HDL: 53.8 mg/dL
LDL Cholesterol: 122 mg/dL — ABNORMAL HIGH (ref 10–99)
NonHDL: 141.82
Total CHOL/HDL Ratio: 4
Triglycerides: 97 mg/dL (ref 10.0–149.0)
VLDL: 19.4 mg/dL (ref 0.0–40.0)

## 2024-06-18 LAB — COMPREHENSIVE METABOLIC PANEL WITH GFR
ALT: 24 U/L (ref 3–35)
AST: 25 U/L (ref 5–37)
Albumin: 4.4 g/dL (ref 3.5–5.2)
Alkaline Phosphatase: 59 U/L (ref 39–117)
BUN: 20 mg/dL (ref 6–23)
CO2: 34 meq/L — ABNORMAL HIGH (ref 19–32)
Calcium: 10.2 mg/dL (ref 8.4–10.5)
Chloride: 103 meq/L (ref 96–112)
Creatinine, Ser: 0.81 mg/dL (ref 0.40–1.20)
GFR: 64.7 mL/min
Glucose, Bld: 105 mg/dL — ABNORMAL HIGH (ref 70–99)
Potassium: 4.3 meq/L (ref 3.5–5.1)
Sodium: 142 meq/L (ref 135–145)
Total Bilirubin: 0.9 mg/dL (ref 0.2–1.2)
Total Protein: 6.9 g/dL (ref 6.0–8.3)

## 2024-06-18 LAB — HEMOGLOBIN A1C: Hgb A1c MFr Bld: 5.7 % (ref 4.6–6.5)

## 2024-06-18 LAB — TSH: TSH: 2.98 u[IU]/mL (ref 0.35–5.50)

## 2024-06-18 LAB — VITAMIN D 25 HYDROXY (VIT D DEFICIENCY, FRACTURES): VITD: 58.68 ng/mL (ref 30.00–100.00)

## 2024-06-23 ENCOUNTER — Other Ambulatory Visit: Payer: Self-pay | Admitting: Family Medicine

## 2024-06-23 DIAGNOSIS — Z1231 Encounter for screening mammogram for malignant neoplasm of breast: Secondary | ICD-10-CM

## 2024-06-25 ENCOUNTER — Encounter: Payer: Self-pay | Admitting: Family Medicine

## 2024-06-25 ENCOUNTER — Ambulatory Visit (INDEPENDENT_AMBULATORY_CARE_PROVIDER_SITE_OTHER): Admitting: Family Medicine

## 2024-06-25 VITALS — BP 138/60 | HR 61 | Temp 97.7°F | Ht 65.25 in | Wt 201.0 lb

## 2024-06-25 DIAGNOSIS — R7303 Prediabetes: Secondary | ICD-10-CM

## 2024-06-25 DIAGNOSIS — I10 Essential (primary) hypertension: Secondary | ICD-10-CM | POA: Diagnosis not present

## 2024-06-25 DIAGNOSIS — R600 Localized edema: Secondary | ICD-10-CM

## 2024-06-25 DIAGNOSIS — E559 Vitamin D deficiency, unspecified: Secondary | ICD-10-CM

## 2024-06-25 DIAGNOSIS — I5032 Chronic diastolic (congestive) heart failure: Secondary | ICD-10-CM

## 2024-06-25 DIAGNOSIS — E7800 Pure hypercholesterolemia, unspecified: Secondary | ICD-10-CM

## 2024-06-25 NOTE — Assessment & Plan Note (Signed)
 Last vitamin D  Lab Results  Component Value Date   VD25OH 58.68 06/18/2024   Vitamin D  level is therapeutic with current supplementation  (2000 international units daily)  Disc importance of this to bone and overall health

## 2024-06-25 NOTE — Assessment & Plan Note (Signed)
 Prediabetes  Lab Results  Component Value Date   HGBA1C 5.7 06/18/2024   HGBA1C 6.0 06/18/2023   HGBA1C 5.7 06/12/2022    disc imp of low glycemic diet and wt loss to prevent DM2

## 2024-06-25 NOTE — Assessment & Plan Note (Addendum)
 Stable  Continues coreg  and spironolactone  and cardiology care

## 2024-06-25 NOTE — Assessment & Plan Note (Signed)
 Not pitting  Consistent with lymphedema and venous insuff

## 2024-06-25 NOTE — Progress Notes (Signed)
 "  Subjective:    Patient ID: [name], female    DOB: July 22, 1933, 89 y.o.   MRN: [id number]  HPI Patient presents for annual follow-up of chronic medical problems  Wt Readings from Last 3 Encounters:  06/25/24 201 lb (91.2 kg)  05/07/24 197 lb (89.4 kg)  12/24/23 197 lb (89.4 kg)   33.19 kg/m  Vitals:   06/25/24 0959 06/25/24 1020  BP: (!) 148/90 138/60  Pulse: 61   Temp: 97.7 F (36.5 C)   SpO2: 95%     Immunization History  Administered Date(s) Administered   Fluad Quad(high Dose 65+) 01/22/2019, 03/29/2023   INFLUENZA, HIGH DOSE SEASONAL PF 02/03/2021, 05/10/2022, 04/09/2023, 02/29/2024   Influenza Split 03/27/2011, 03/25/2012   Influenza Whole 03/07/2007, 02/17/2008, 03/08/2009, 03/20/2010   Influenza,inj,Quad PF,6+ Mos 03/06/2013, 02/23/2014, 02/24/2016   Influenza-Unspecified 02/27/2015, 01/19/2017, 12/26/2017   Pneumococcal Conjugate-13 01/15/2014   Pneumococcal Polysaccharide-23 03/08/2009   Td 05/29/1995, 03/08/2009   Tdap 05/19/2021   Zoster, Live 08/12/2007    Health Maintenance Due  Topic Date Due   Mammogram  05/29/2024   Feeling ok overall   Shingrix -declines Has zostavax in past   Mammogram 05/2023-has one scheduled tuesday Self breast exam-no lumps  Declines esam today   Gyn health Past endometrial cancer with TAH/BSO    Colon cancer screening -out aged   Bone health  Dexa 05/2021 low normal bmd  Falls-one   (tends to loose balance when she eats too much fruit/ melon and berries)  Fractures-none  Supplements vit D3 1000 international units bid  Last vitamin D  Lab Results  Component Value Date   VD25OH 58.68 06/18/2024    Exercise  Uses a walker  Very little  Is interested in chair exercise   Mood    06/25/2024   10:04 AM 05/07/2024    1:14 PM 12/24/2023   11:31 AM 05/07/2023    1:14 PM 05/23/2022    3:48 PM  Depression screen PHQ 2/9  Decreased Interest 0 0 0 0 0  Down, Depressed, Hopeless 0 0 0 0 0  PHQ - 2 Score  0 0 0 0 0    HTN bp is stable today  No cp or palpitations or headaches or edema  No side effects to medicines  BP Readings from Last 3 Encounters:  06/25/24 138/60  12/24/23 138/76  09/05/23 (!) 140/82     Lab Results  Component Value Date   NA 142 06/18/2024   K 4.3 06/18/2024   CO2 34 (H) 06/18/2024   GLUCOSE 105 (H) 06/18/2024   BUN 20 06/18/2024   CREATININE 0.81 06/18/2024   CALCIUM 10.2 06/18/2024   GFR 64.70 06/18/2024   EGFR 68 (L) 06/23/2015   GFRNONAA 69 11/11/2017   Spironolactone  25 mg daily  Lasix  20 mg daily  Coreg  25 mg bid  Does take klor con 20 meq daily despite the spironolactone    History of chronic DD  Prediabetes Lab Results  Component Value Date   HGBA1C 5.7 06/18/2024   HGBA1C 6.0 06/18/2023   HGBA1C 5.7 06/12/2022   Hyperlipidemia Lab Results  Component Value Date   CHOL 196 06/18/2024   CHOL 198 06/18/2023   CHOL 193 06/12/2022   Lab Results  Component Value Date   HDL 53.80 06/18/2024   HDL 57.10 06/18/2023   HDL 52.40 06/12/2022   Lab Results  Component Value Date   LDLCALC 122 (H) 06/18/2024   LDLCALC 124 (H) 06/18/2023   LDLCALC 125 (H) 06/12/2022  Lab Results  Component Value Date   TRIG 97.0 06/18/2024   TRIG 85.0 06/18/2023   TRIG 81.0 06/12/2022   Lab Results  Component Value Date   CHOLHDL 4 06/18/2024   CHOLHDL 3 06/18/2023   CHOLHDL 4 06/12/2022   Lab Results  Component Value Date   LDLDIRECT 162.4 03/18/2012   LDLDIRECT 147.6 03/16/2010   Diet controlled   Eats healthy in general  Very balanced diet overall  Gets protein with every meal      Lab Results  Component Value Date   ALT 24 06/18/2024   AST 25 06/18/2024   ALKPHOS 59 06/18/2024   BILITOT 0.9 06/18/2024    Lab Results  Component Value Date   WBC 4.7 06/18/2024   HGB 15.0 06/18/2024   HCT 44.5 06/18/2024   MCV 87.7 06/18/2024   PLT 138.0 (L) 06/18/2024  Plt stable  Lab Results  Component Value Date   TSH 2.98  06/18/2024      Patient Active Problem List   Diagnosis Date Noted   Left shoulder pain 06/19/2022   Estrogen deficiency 05/25/2021   Chronic diastolic heart failure (HCC) 12/01/2018   Urge incontinence 10/15/2016   Nonallopathic lesion of lumbar region 03/28/2015   Nonallopathic lesion of thoracic region 03/28/2015   Encounter for Medicare annual wellness exam 01/15/2014   Risk for falls 03/25/2012   Vitamin D  deficiency 08/29/2009   DEPENDENT EDEMA, LEGS 09/07/2008   Constipation 07/30/2008   Prediabetes 07/09/2008   History of endometrial cancer 10/28/2007   Hearing loss 10/15/2007   Internal hemorrhoids 10/15/2007   External hemorrhoids 10/15/2007   ESOPHAGEAL SPASM 10/15/2007   Diverticulosis of colon 10/15/2007   Hyperlipidemia 10/07/2007   Essential hypertension 10/07/2007   Aortic valve disorder 10/26/2004   Diaphragmatic hernia 12/27/1995   Past Medical History:  Diagnosis Date   Alcohol abuse, unspecified    Aortic stenosis    s/p tissue AVR 05/2011   Arthritis    Breast CA (HCC)    rt   Diaphragmatic hernia without mention of obstruction or gangrene    Diverticulosis of colon (without mention of hemorrhage)    Dyskinesia of esophagus    Endometrial cancer (HCC)    Family history of colonic polyps    Heart murmur    HLD (hyperlipidemia)    HTN (hypertension)    Internal hemorrhoids without mention of complication    Malignant neoplasm of corpus uteri, except isthmus (HCC)    Other abnormal glucose    Pain in joint, shoulder region    Postmenopausal bleeding    Tobacco use disorder    Unspecified hearing loss    Vertigo    Past Surgical History:  Procedure Laterality Date   AORTIC VALVE REPLACEMENT  06/13/2011   Procedure: AORTIC VALVE REPLACEMENT (AVR);  Surgeon: Dorise MARLA Fellers, MD;  Location[facility];  Service: Open Heart Surgery;  Laterality: N/A;   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   BREAST LUMPECTOMY  11/2009   DCIS, neg sentinel lymph node biopsy    CATARACT EXTRACTION     bil   LAPAROSCOPY W/ TOTAL BILATERAL PELVIC AND PERI-AORTIC LYMPHADENECTOMY  11/04/2007   PARAAORTIC LYMPHADENECTOMY     ROTATOR CUFF REPAIR  05/2008   right   TOTAL ABDOMINAL HYSTERECTOMY W/ BILATERAL SALPINGOOPHORECTOMY  11/04/07   salpingo-oophrectomy   Social History[1] Family History  Problem Relation Age of Onset   Colon polyps Sister        2010   Heart disease  Mother    Heart attack Mother    Stroke Father    Heart failure Father    Diabetes type II Father    Diabetes type II Brother    Cancer Maternal Aunt        possible post menopausal breast cancer   Allergies[2] Medications Ordered Prior to Encounter[3]  Review of Systems  Constitutional:  Negative for activity change, appetite change, fatigue, fever and unexpected weight change.  HENT:  Negative for congestion, ear pain, rhinorrhea, sinus pressure and sore throat.   Eyes:  Negative for pain, redness and visual disturbance.  Respiratory:  Negative for cough, shortness of breath and wheezing.   Cardiovascular:  Negative for chest pain and palpitations.       Swelling in ankles is lymphedema   Gastrointestinal:  Negative for abdominal pain, blood in stool, constipation and diarrhea.  Endocrine: Negative for polydipsia and polyuria.  Genitourinary:  Negative for dysuria, frequency and urgency.  Musculoskeletal:  Positive for arthralgias. Negative for back pain and myalgias.       Mobility impaired   Skin:  Negative for pallor and rash.  Allergic/Immunologic: Negative for environmental allergies.  Neurological:  Negative for dizziness, syncope and headaches.  Hematological:  Negative for adenopathy. Does not bruise/bleed easily.  Psychiatric/Behavioral:  Negative for decreased concentration and dysphoric mood. The patient is not nervous/anxious.        Objective:   Physical Exam Constitutional:      General: She is not in acute distress.    Appearance: Normal appearance. She is  well-developed. She is obese. She is not ill-appearing or diaphoretic.  HENT:     Head: Normocephalic and atraumatic.     Right Ear: Tympanic membrane, ear canal and external ear normal.     Left Ear: Tympanic membrane, ear canal and external ear normal.     Nose: Nose normal. No congestion.     Mouth/Throat:     Mouth: Mucous membranes are moist.     Pharynx: Oropharynx is clear. No posterior oropharyngeal erythema.  Eyes:     General: No scleral icterus.    Extraocular Movements: Extraocular movements intact.     Conjunctiva/sclera: Conjunctivae normal.     Pupils: Pupils are equal, round, and reactive to light.  Neck:     Thyroid : No thyromegaly.     Vascular: No carotid bruit or JVD.  Cardiovascular:     Rate and Rhythm: Normal rate and regular rhythm.     Heart sounds: Murmur heard.     No gallop.     Comments: Baseline M/click from art valve   Unable to palp pedal pulses due to lymphedema  Feet are warm  Pulmonary:     Effort: Pulmonary effort is normal. No respiratory distress.     Breath sounds: Normal breath sounds. No wheezing.     Comments: Good air exch Chest:     Chest wall: No tenderness.  Abdominal:     General: Bowel sounds are normal. There is no distension or abdominal bruit.     Palpations: Abdomen is soft. There is no mass.     Tenderness: There is no abdominal tenderness.     Hernia: No hernia is present.  Genitourinary:    Comments: Pt declines breast exam Musculoskeletal:        General: No tenderness. Normal range of motion.     Cervical back: Normal range of motion and neck supple. No rigidity. No muscular tenderness.     Right lower leg:  No edema.     Left lower leg: No edema.     Comments: No kyphosis   Baseline non pitting lymphedema in ankles   Lymphadenopathy:     Cervical: No cervical adenopathy.  Skin:    General: Skin is warm and dry.     Coloration: Skin is not pale.     Findings: No erythema or rash.     Comments: Solar lentigines  diffusely  Many sks on back /trunk   Neurological:     Mental Status: She is alert. Mental status is at baseline.     Cranial Nerves: No cranial nerve deficit.     Motor: No abnormal muscle tone.     Coordination: Coordination normal.     Gait: Gait normal.     Deep Tendon Reflexes: Reflexes are normal and symmetric. Reflexes normal.  Psychiatric:        Mood and Affect: Mood normal.        Cognition and Memory: Cognition and memory normal.           Assessment & Plan:   Assessment & Plan Essential hypertension bp in fair control at this time  BP Readings from Last 1 Encounters:  06/25/24 138/60   No changes needed Most recent labs reviewed  Disc lifstyle change with low sodium diet and exercise  Better on 2nd check   Continues Spironolactone  25 mg daily  Lasix  20 mg daily  Coreg  25 mg bid With cardiology care       Pure hypercholesterolemia Disc goals for lipids and reasons to control them Rev last labs with pt Rev low sat fat diet in detail LDL in 120s Stable/ good diet     Prediabetes Prediabetes  Lab Results  Component Value Date   HGBA1C 5.7 06/18/2024   HGBA1C 6.0 06/18/2023   HGBA1C 5.7 06/12/2022    disc imp of low glycemic diet and wt loss to prevent DM2      Vitamin D  deficiency Last vitamin D  Lab Results  Component Value Date   VD25OH 58.68 06/18/2024   Vitamin D  level is therapeutic with current supplementation  (2000 international units daily)  Disc importance of this to bone and overall health      Chronic diastolic heart failure (HCC) Stable  Continues coreg  and spironolactone  and cardiology care      Localized edema Not pitting  Consistent with lymphedema and venous insuff           [1]  Social History Tobacco Use   Smoking status: Former    Current packs/day: 0.00    Average packs/day: 1.0 packs/day    Types: Cigarettes    Quit date: 03/28/2000    Years since quitting: 24.2   Smokeless tobacco: Never   Vaping Use   Vaping status: Never Used  Substance Use Topics   Alcohol use: Not Currently    Alcohol/week: 0.0 standard drinks of alcohol   Drug use: Never  [2]  Allergies Allergen Reactions   Aspirin  Other (See Comments)    dizzy   Calcium     REACTION: constipation   Diovan [Valsartan]     REACTION: abdominal pain   Ezetimibe     REACTION: fatigue   Ramipril     REACTION: abdominal pain   Simvastatin     REACTION: leg pain and body aches  [3]  Current Outpatient Medications on File Prior to Visit  Medication Sig Dispense Refill   amoxicillin (AMOXIL) 500 MG capsule Take 2,000 mg by  mouth daily. Take prior to dental     carvedilol  (COREG ) 25 MG tablet TAKE 1 TABLET BY MOUTH TWICE A DAY WITH FOOD 180 tablet 0   Cholecalciferol (VITAMIN D ) 1000 UNITS capsule Take 1 capsule (1,000 Units total) by mouth 2 (two) times daily. 180 capsule 3   diphenhydrAMINE (BENADRYL) 25 mg capsule Take 25 mg by mouth every 6 (six) hours as needed. Patient taking to help sleep     furosemide  (LASIX ) 20 MG tablet Take 1 tablet (20 mg total) by mouth daily. 90 tablet 3   hydrocortisone  (ANUSOL -HC) 2.5 % rectal cream Place 1 Application rectally 2 (two) times daily as needed for hemorrhoids or anal itching. 30 g 1   Omega-3 Fatty Acids (FISH OIL) 1000 MG CAPS Take 2,000 mg by mouth daily.     potassium chloride  SA (KLOR-CON  M20) 20 MEQ tablet Take 1 tablet (20 mEq total) by mouth daily. 90 tablet 3   Psyllium (METAMUCIL PO) Take 1 Dose by mouth as directed.      spironolactone  (ALDACTONE ) 25 MG tablet Take 1 tablet (25 mg total) by mouth daily. 90 tablet 3   No current facility-administered medications on file prior to visit.   "

## 2024-06-25 NOTE — Assessment & Plan Note (Addendum)
 bp in fair control at this time  BP Readings from Last 1 Encounters:  06/25/24 138/60   No changes needed Most recent labs reviewed  Disc lifstyle change with low sodium diet and exercise  Better on 2nd check   Continues Spironolactone  25 mg daily  Lasix  20 mg daily  Coreg  25 mg bid With cardiology care

## 2024-06-25 NOTE — Assessment & Plan Note (Signed)
 Disc goals for lipids and reasons to control them Rev last labs with pt Rev low sat fat diet in detail LDL in 120s Stable/ good diet

## 2024-06-25 NOTE — Patient Instructions (Addendum)
 Add some strength training to your routine, this is important for bone and brain health and can reduce your risk of falls and help your body use insulin  properly and regulate weight  Light weights, exercise bands , and internet videos are a good way to start  Yoga (chair or regular), machines , floor exercises or a gym with machines are also good options   I think a chair exercise program would be great for you   Take care of yourself the best you can   Continue to eat a balanced diet  Do make sure to get protein with every meal  The following are examples of protein in diet  Meat -lean  Fish  Eggs  Dairy products  Soy products  Oat milk  Almond milk Nuts and nut butters  Legumes  Dried beans

## 2024-06-30 ENCOUNTER — Inpatient Hospital Stay: Admission: RE | Admit: 2024-06-30 | Discharge: 2024-06-30 | Attending: Family Medicine | Admitting: Family Medicine

## 2024-06-30 DIAGNOSIS — Z1231 Encounter for screening mammogram for malignant neoplasm of breast: Secondary | ICD-10-CM

## 2024-07-02 ENCOUNTER — Ambulatory Visit: Payer: Self-pay | Admitting: Family Medicine

## 2024-09-15 ENCOUNTER — Ambulatory Visit: Admitting: Cardiovascular Disease

## 2024-09-15 ENCOUNTER — Ambulatory Visit (HOSPITAL_COMMUNITY)

## 2024-09-17 ENCOUNTER — Ambulatory Visit (HOSPITAL_COMMUNITY)
